# Patient Record
Sex: Female | Born: 1935 | Race: White | Hispanic: No | Smoking: Never smoker
Health system: Southern US, Community
[De-identification: ages and names within clinical notes are randomized; demographics above are authoritative.]

## PROBLEM LIST (undated history)

## (undated) DIAGNOSIS — M8580 Other specified disorders of bone density and structure, unspecified site: Secondary | ICD-10-CM

## (undated) DIAGNOSIS — K589 Irritable bowel syndrome without diarrhea: Secondary | ICD-10-CM

## (undated) DIAGNOSIS — R319 Hematuria, unspecified: Secondary | ICD-10-CM

## (undated) DIAGNOSIS — G4700 Insomnia, unspecified: Secondary | ICD-10-CM

## (undated) DIAGNOSIS — I119 Hypertensive heart disease without heart failure: Secondary | ICD-10-CM

## (undated) DIAGNOSIS — K921 Melena: Secondary | ICD-10-CM

## (undated) DIAGNOSIS — L309 Dermatitis, unspecified: Secondary | ICD-10-CM

## (undated) DIAGNOSIS — I10 Essential (primary) hypertension: Secondary | ICD-10-CM

## (undated) DIAGNOSIS — N362 Urethral caruncle: Secondary | ICD-10-CM

## (undated) DIAGNOSIS — N939 Abnormal uterine and vaginal bleeding, unspecified: Secondary | ICD-10-CM

## (undated) DIAGNOSIS — M4310 Spondylolisthesis, site unspecified: Secondary | ICD-10-CM

## (undated) DIAGNOSIS — F0390 Unspecified dementia without behavioral disturbance: Secondary | ICD-10-CM

## (undated) HISTORY — DX: Abnormal uterine and vaginal bleeding, unspecified: N93.9

## (undated) HISTORY — DX: Melena: K92.1

## (undated) HISTORY — DX: Urethral caruncle: N36.2

## (undated) HISTORY — DX: Hematuria, unspecified: R31.9

---

## 2007-01-20 ENCOUNTER — Ambulatory Visit: Payer: Self-pay | Admitting: Internal Medicine

## 2007-09-22 ENCOUNTER — Ambulatory Visit: Payer: Self-pay | Admitting: Family Medicine

## 2010-05-01 ENCOUNTER — Ambulatory Visit: Payer: Self-pay | Admitting: Ophthalmology

## 2010-06-05 ENCOUNTER — Ambulatory Visit: Payer: Self-pay | Admitting: Ophthalmology

## 2010-08-20 DIAGNOSIS — K589 Irritable bowel syndrome without diarrhea: Secondary | ICD-10-CM | POA: Insufficient documentation

## 2010-08-20 DIAGNOSIS — H9190 Unspecified hearing loss, unspecified ear: Secondary | ICD-10-CM | POA: Insufficient documentation

## 2010-08-20 DIAGNOSIS — L309 Dermatitis, unspecified: Secondary | ICD-10-CM | POA: Insufficient documentation

## 2010-08-20 DIAGNOSIS — G4700 Insomnia, unspecified: Secondary | ICD-10-CM | POA: Insufficient documentation

## 2010-08-20 DIAGNOSIS — F39 Unspecified mood [affective] disorder: Secondary | ICD-10-CM | POA: Insufficient documentation

## 2010-11-19 DIAGNOSIS — R413 Other amnesia: Secondary | ICD-10-CM | POA: Insufficient documentation

## 2010-12-18 ENCOUNTER — Ambulatory Visit: Payer: Self-pay | Admitting: Family Medicine

## 2011-07-11 ENCOUNTER — Observation Stay: Payer: Self-pay | Admitting: Internal Medicine

## 2011-07-11 LAB — COMPREHENSIVE METABOLIC PANEL
Alkaline Phosphatase: 84 U/L (ref 50–136)
BUN: 14 mg/dL (ref 7–18)
Bilirubin,Total: 0.5 mg/dL (ref 0.2–1.0)
Chloride: 101 mmol/L (ref 98–107)
Co2: 29 mmol/L (ref 21–32)
EGFR (Non-African Amer.): >60
Glucose: 97 mg/dL (ref 65–99)
Potassium: 3.4 mmol/L — ABNORMAL LOW (ref 3.5–5.1)

## 2011-07-11 LAB — URINALYSIS, COMPLETE
Bacteria: NONE SEEN
Glucose,UR: NEGATIVE mg/dL (ref 0–75)
Leukocyte Esterase: NEGATIVE
Nitrite: NEGATIVE
Ph: 5 (ref 4.5–8.0)
Squamous Epithelial: NONE SEEN
WBC UR: <1 /HPF (ref 0–5)

## 2011-07-11 LAB — CBC
HCT: 38.8 % (ref 35.0–47.0)
HGB: 12.5 g/dL (ref 12.0–16.0)
WBC: 6.4 10*3/uL (ref 3.6–11.0)

## 2011-07-12 LAB — CBC WITH DIFFERENTIAL/PLATELET
Basophil #: 0.1 x10 3/mm 3
Basophil %: 1.4 %
Eosinophil #: 0.4 x10 3/mm 3
Eosinophil %: 6.3 %
HCT: 34.8 % — ABNORMAL LOW
HGB: 11.3 g/dL — ABNORMAL LOW
Lymphocyte %: 15.6 %
Lymphs Abs: 1 x10 3/mm 3
MCH: 27 pg
MCHC: 32.6 g/dL
MCV: 83 fL
Monocyte #: 0.5 "x10 3/mm "
Monocyte %: 8.4 %
Neutrophil #: 4.3 x10 3/mm 3
Neutrophil %: 68.3 %
Platelet: 174 x10 3/mm 3
RBC: 4.2 X10 6/mm 3
RDW: 14.3 %
WBC: 6.3 x10 3/mm 3

## 2011-07-12 LAB — BASIC METABOLIC PANEL WITH GFR
Anion Gap: 8
BUN: 11 mg/dL
Calcium, Total: 8.3 mg/dL — ABNORMAL LOW
Chloride: 105 mmol/L
Co2: 25 mmol/L
Creatinine: 0.79 mg/dL
EGFR (African American): >60
EGFR (Non-African Amer.): >60
Glucose: 103 mg/dL — ABNORMAL HIGH
Osmolality: 275
Potassium: 3.2 mmol/L — ABNORMAL LOW
Sodium: 138 mmol/L

## 2011-07-12 LAB — MAGNESIUM: Magnesium: 1.4 mg/dL — ABNORMAL LOW

## 2011-07-17 LAB — CULTURE, BLOOD (SINGLE)

## 2012-01-15 ENCOUNTER — Emergency Department: Payer: Self-pay

## 2012-01-15 LAB — URINALYSIS, COMPLETE
Bacteria: NONE SEEN
Glucose,UR: NEGATIVE mg/dL (ref 0–75)
Nitrite: NEGATIVE
Ph: 8 (ref 4.5–8.0)
Protein: NEGATIVE
RBC,UR: 1 /HPF (ref 0–5)
Specific Gravity: 1.004 (ref 1.003–1.030)
WBC UR: 2 /HPF (ref 0–5)

## 2012-01-15 LAB — CK TOTAL AND CKMB (NOT AT ARMC)
CK, Total: 72 U/L (ref 21–215)
CK-MB: 2.1 ng/mL (ref 0.5–3.6)

## 2012-01-15 LAB — TROPONIN I: Troponin-I: <0.02 ng/mL

## 2012-01-15 LAB — COMPREHENSIVE METABOLIC PANEL
Albumin: 4.2 g/dL (ref 3.4–5.0)
Anion Gap: 6 — ABNORMAL LOW (ref 7–16)
BUN: 6 mg/dL — ABNORMAL LOW (ref 7–18)
Bilirubin,Total: 0.5 mg/dL (ref 0.2–1.0)
Creatinine: 0.8 mg/dL (ref 0.60–1.30)
Glucose: 149 mg/dL — ABNORMAL HIGH (ref 65–99)
Osmolality: 282 (ref 275–301)
Potassium: 3.8 mmol/L (ref 3.5–5.1)
Sodium: 141 mmol/L (ref 136–145)
Total Protein: 7.8 g/dL (ref 6.4–8.2)

## 2012-01-15 LAB — CBC
HCT: 38 % (ref 35.0–47.0)
HGB: 12.4 g/dL (ref 12.0–16.0)
MCH: 26.5 pg (ref 26.0–34.0)
RBC: 4.7 10*6/uL (ref 3.80–5.20)

## 2012-10-06 ENCOUNTER — Ambulatory Visit: Payer: Self-pay

## 2013-01-25 DIAGNOSIS — F9821 Rumination disorder of infancy: Secondary | ICD-10-CM | POA: Insufficient documentation

## 2013-01-25 DIAGNOSIS — F0390 Unspecified dementia without behavioral disturbance: Secondary | ICD-10-CM | POA: Insufficient documentation

## 2013-06-08 IMAGING — CT CT HEAD WITHOUT CONTRAST
2 series · 16 of 30 positions shown, 20 images · non-contrast
Comparison: No prior.

REASON FOR EXAM: pressure behind eyes and dizziness with new onset HTN
COMMENTS:

PROCEDURE:     CT  - CT HEAD WITHOUT CONTRAST  - January 15, 2012  [DATE]
RESULT:     History: Dizziness. Headache. High blood pressure.

[Series 2: without · axial · non-contrast · 0.42mm/px · z∈[-214,-79]mm · 13 of 33 slices shown, 17 images]
[im 3/33  brain]
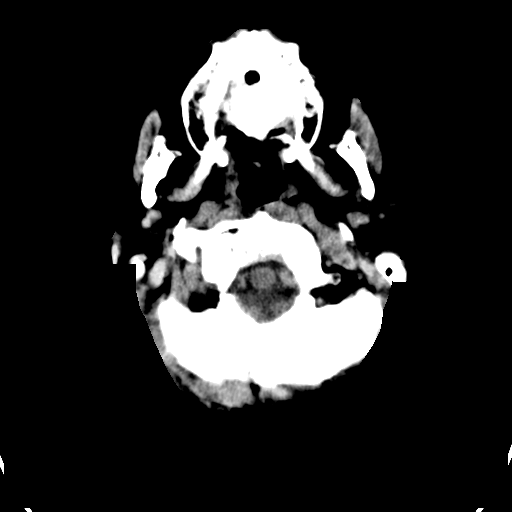
[im 3/33  bone]
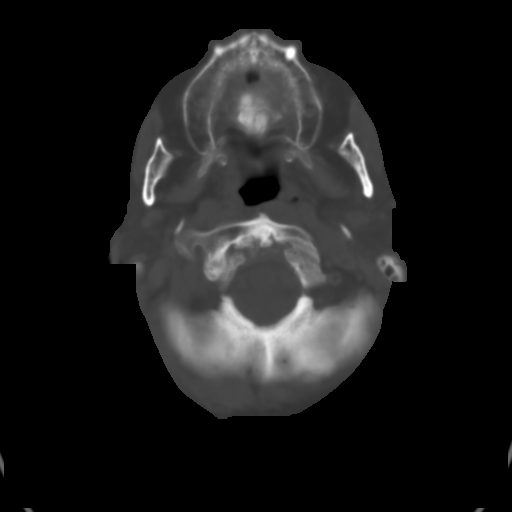
[im 5/33  brain]
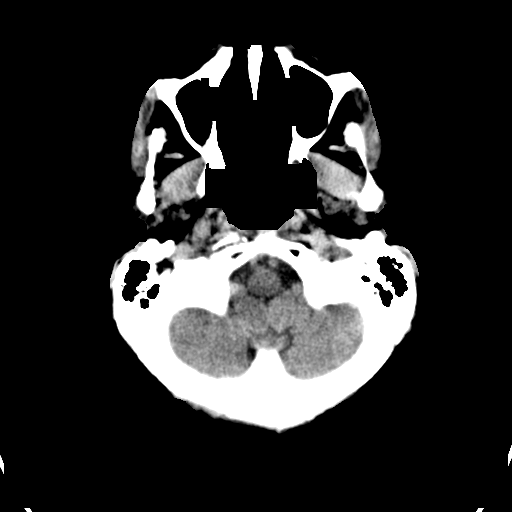
[im 7/33  brain]
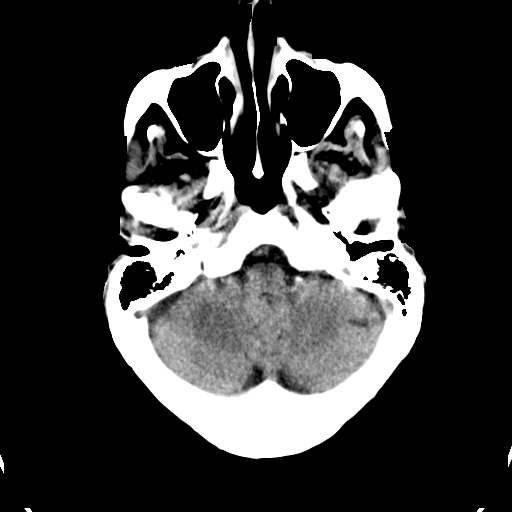
[im 10/33  brain]
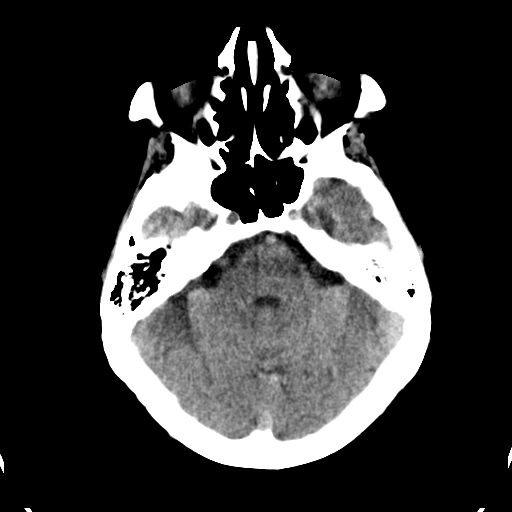
[im 12/33  brain]
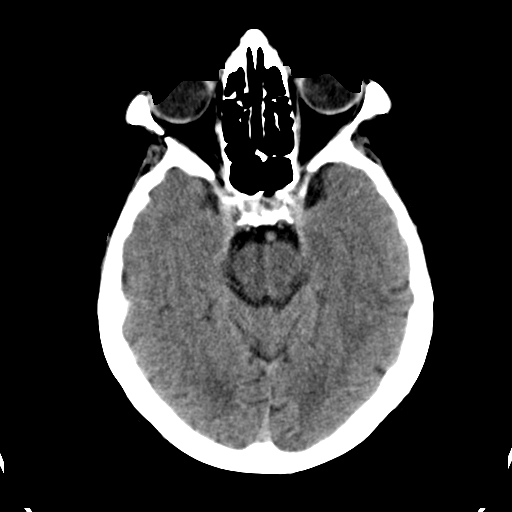
[im 12/33  bone]
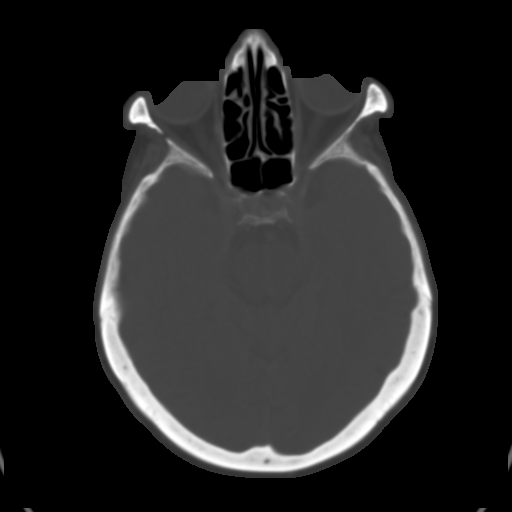
[im 14/33  brain]
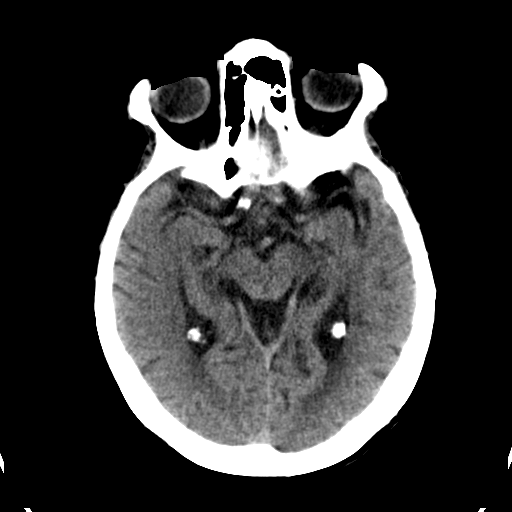
[im 17/33  brain]
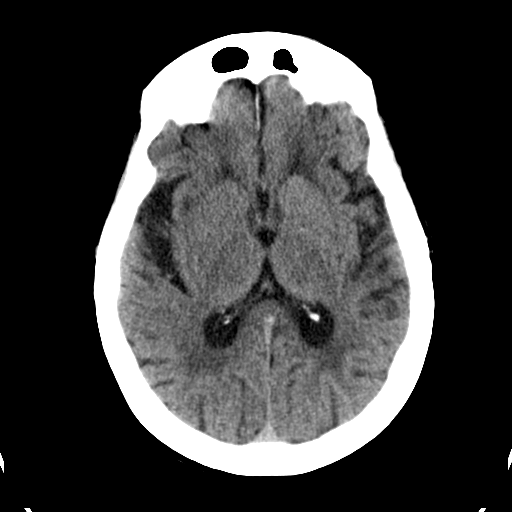
[im 19/33  brain]
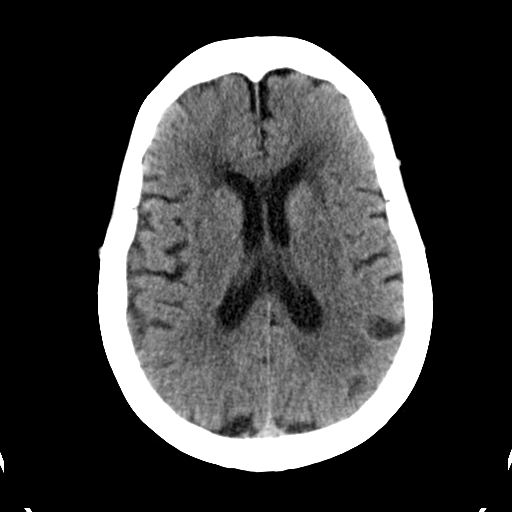
[im 21/33  brain]
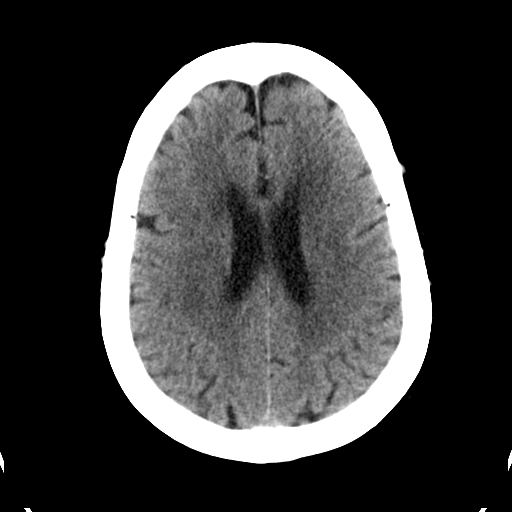
[im 21/33  bone]
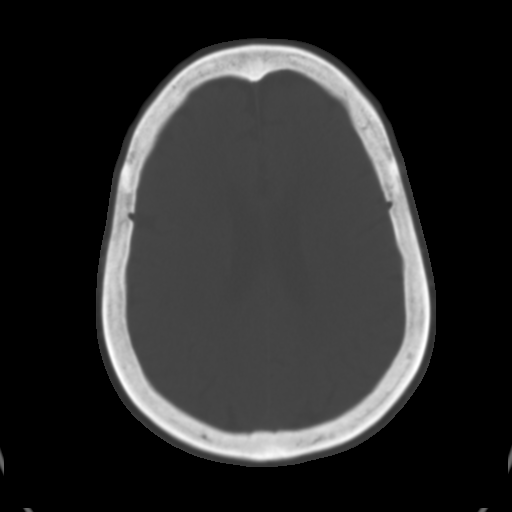
[im 23/33  brain]
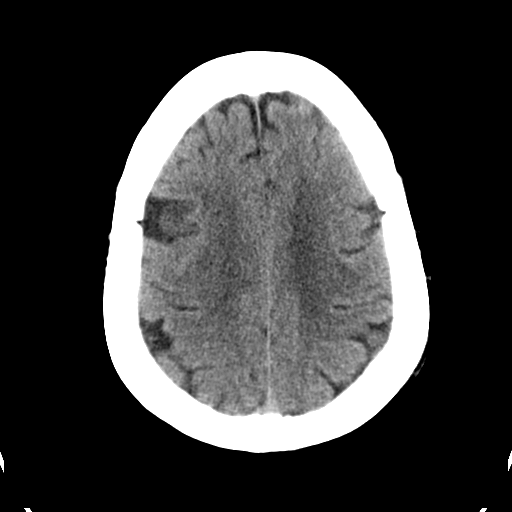
[im 26/33  brain]
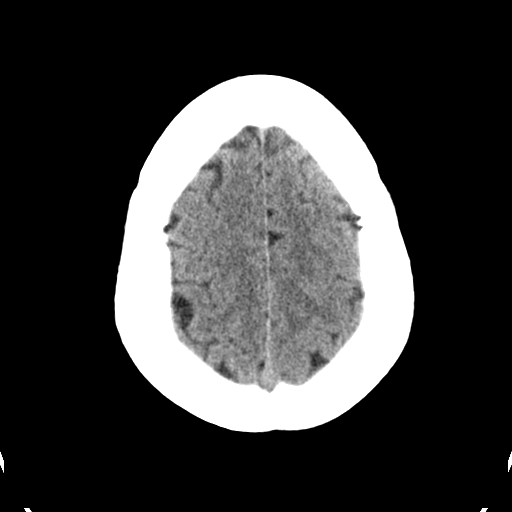
[im 28/33  brain]
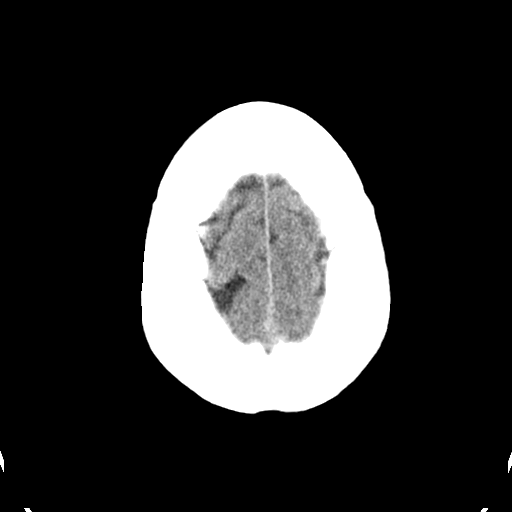
[im 30/33  brain]
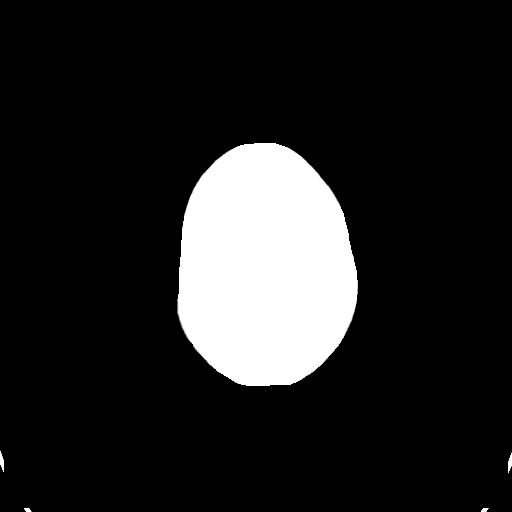
[im 30/33  bone]
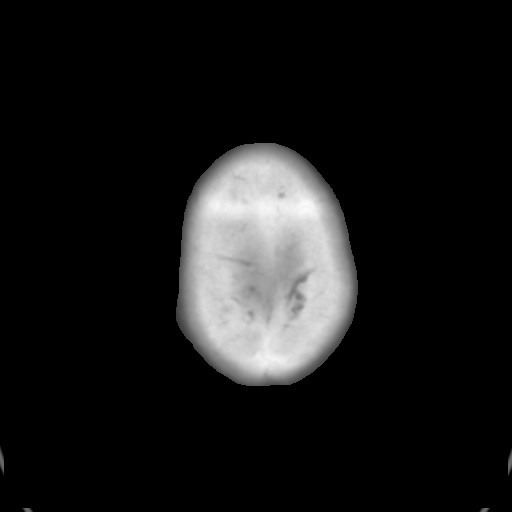

[Series 3: bone · axial · 0.42mm/px · z∈[-214,-169]mm · 3 of 33 slices shown]
[im 3/33  bone]
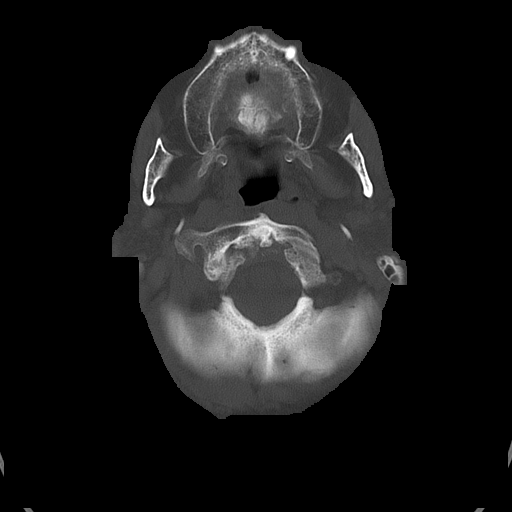
[im 7/33  bone]
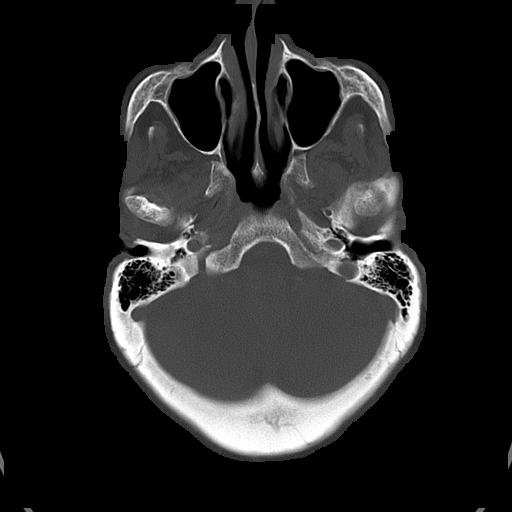
[im 12/33  bone]
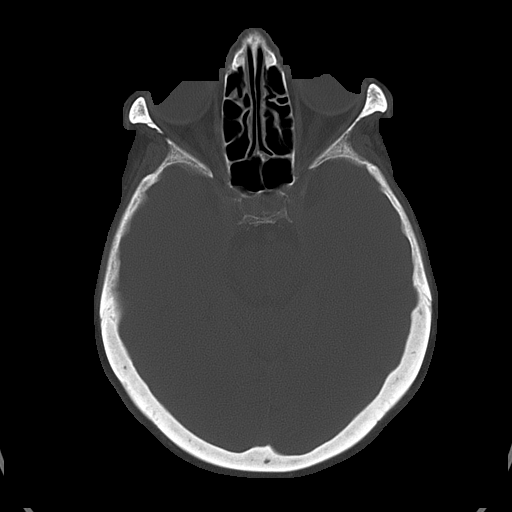

[16 of 30 positions shown; findings below may reference images not displayed]

FINDINGS: Standard nonenhanced CT obtained. Periventricular changes noted
consistent with chronic ischemia. No acute bony abnormality noted. No mass.
No hemorrhage.
IMPRESSION: Chronic white matter changes. No acute abnormality.

## 2013-07-09 DIAGNOSIS — M67919 Unspecified disorder of synovium and tendon, unspecified shoulder: Secondary | ICD-10-CM | POA: Insufficient documentation

## 2013-12-21 DIAGNOSIS — I119 Hypertensive heart disease without heart failure: Secondary | ICD-10-CM | POA: Insufficient documentation

## 2014-05-29 NOTE — H&P (Signed)
PATIENT NAME:  [name], [name] MR#:  [id number] DATE OF BIRTH:  10-12-1924  DATE OF ADMISSION:  07/11/2011  REFERRING PHYSICIAN: Dr. Buford DresserFinnell PRIMARY CARE PHYSICIAN: Dr. Gavin PottersGrandis   CHIEF COMPLAINT: Weakness.   HISTORY OF PRESENT ILLNESS: The patient is an 81 year old Caucasian female with mild dementia who has been living at home taking care of her own activities of daily living, with insomnia and mood disorder who does yard work and planting. The patient, per family who is here with her including two daughters, has had three tick bites in the last two weeks. The patient had fever and malaise and felt unwell and went [facility]. There per her daughters she was diagnosed with "tick fever" as well as a urinary tract infection and given doxycycline and Augmentin. The patient had another fever on Tuesday and [facility]. Given the long lines, they left the ER. Per daughters she has been having decreased p.o. intake and decreased energy for the past couple of days. To them this is not her baseline. She has had some increased weakness per family. On arrival here, she had no leukocytosis or fever, and had a clear urinalysis. She was given some IV fluids and the hospitalist service was contacted for further evaluation and management.  PAST MEDICAL HISTORY:  1. Dementia.  2. IDS. 3. Eczema.  4. Insomnia.  5. Mood disorder.   PAST SURGICAL HISTORY: Hysterectomy.   FAMILY HISTORY: Per daughter there is no family history of hypertension, diabetes, or coronary artery disease.   SOCIAL HISTORY: Lives independently. No alcohol, tobacco, or drug use.   ALLERGIES: No known drug allergies.   MEDICATIONS:  1. Magnesium oxide 1 tab daily.  2. Vitamin B12 1 tab daily, unknown dose. 3. Aspirin 81 mg, 2 tabs daily.  4. Namenda 5 mg, 1 tab 2 times a day.  5. Trazodone 100 mg at bedtime.  6. Folic acid 1 mg daily.  7. Doxycycline 100 mg 2 times a day. Today is day 3 out of 14.    REVIEW OF SYSTEMS: Unable  to obtain as the patient is with dementia and she denies every question asked, and I do not believe the patient is fully reliable.  PHYSICAL EXAMINATION: VITAL SIGNS: Temperature 98.6, pulse rate 67, respiratory rate 18, blood pressure 115/61. Oxygen saturation 98% on room air on arrival.   GENERAL: The patient is an elderly, thin Caucasian female laying in bed in no obvious distress, talking in full sentences.   HEENT: Normocephalic, atraumatic. Pupils are equal and reactive. Anicteric sclerae. Slight rash on the upper lip.   NECK: Supple. No thyroid tenderness. No cervical or submental lymphadenopathy. No mandibular lymphadenopathy.   CARDIOVASCULAR: S1, S2 regular rate and rhythm. No murmurs, rubs, or gallops.   LUNGS: Clear to auscultation without wheezing or rhonchi.   ABDOMEN: Soft, nontender, nondistended. Positive normal active bowel sounds in all quadrants.   EXTREMITIES: There is no significant lower extremity edema.   SKIN: Significant varicose veins in the lower extremities. There is perhaps a very mild petechial rash on the right ankle area and area of rash without ulceration below the left breast, perhaps about an inch area. No tenderness.  Diffuse.  NEUROLOGICAL: Cranial nerves III through XII grossly intact. Strength is five out of five in all extremities. Sensation is intact to light touch.   PSYCH: Awake, not oriented to time or place but oriented to daughters.   LABORATORY, DIAGNOSTIC, AND RADIOLOGICAL DATA: Sodium 137, potassium 3.4, otherwise BMP  within normal limits. LFTs within normal limits. Troponin negative. CBC within normal limits. WBC 6.4. Urinalysis not suggestive of infection, without nitrites or leukocyte esterase, or bacteria.   EKG normal sinus rhythm, rate 79, incomplete right bundle branch block, possible anterior infarct, age indeterminant. No acute ST elevations or depressions.   ASSESSMENT AND PLAN: 81 year old Caucasian female with dementia, IDS,  and eczema who is living independently, status post three thick bites in the last several weeks with fever who is being treated with doxycycline. She has had lab work done at [facility] prior.  She presents with weakness and decreased p.o. intake. The patient has no significant electrolyte abnormalities and has a urinalysis which does not suggest significant dehydration or urinary tract infection. She has no fever or leukocytosis. The patient overall is not a reliable historian. However, she states that she has a bad taste in her mouth, which possibly might be in the setting of some medications, possibly her antibiotics. Furthermore, perhaps there is an infectious reason for this given the tick bites. However, she is on proper treatment with doxycycline and her urinary tract infection has been treated adequately. We will admit the patient for observation and start the patient on some D5 one-half normal saline. I discussed the importance of getting the lab work done at [facility] with the family and they will sign a consent. She is afebrile. Given the patient's history of eczema and chronic rashes on her lower and upper extremities per family, it is difficult to entertain possibility of a tickborne rash at this point, however. Blood cultures have been drawn here which we will follow.  I would  also add some Lyme antibodies including IgM and IgG for now and consider doing further testing including Western blot. I will continue her Namenda, trazodone, and vitamins.  I will also continue the aspirin. The patient is a DO NOT RESUSCITATE per family.   TOTAL TIME SPENT: 60 minutes.  ____________________________ Krystal Eaton, MD sa:bjt D:  07/11/2011 21:15:37 ET          T: 07/12/2011 07:57:24 ET          JOB#: [id number]  cc: Letitia Caul, MD Krystal Eaton MD ELECTRONICALLY SIGNED 07/26/2011 20:01

## 2014-05-29 NOTE — Discharge Summary (Signed)
PATIENT NAMHarvel Chavez:  [name] MR#:  [id number] DATE OF BIRTH:  [birth year]  DATE OF ADMISSION:  07/11/2011 DATE OF DISCHARGE:  07/12/2011  PRIMARY CARE PHYSICIAN: Rolm GalaHeidi Grandis, MD   FINAL DIAGNOSES:  1. Weakness.  2. Hypokalemia.  3. Hypomagnesemia.  4. Fever, possibly tick borne.  5. Dementia.   MEDICATIONS ON DISCHARGE:  1. Vitamin B12 1 tablet daily.  2. Trazodone 100 mg at bedtime.  3. Folic acid 1 mg daily.  4. Namenda 10 mg at bedtime.  5. Doxycycline 100 mg twice a day.  6. Magnesium oxide 400 mg twice a day.  7. Potassium chloride 20 mEq daily.   REFERRAL: Home physical therapy   ACTIVITY: As tolerated.   DIET: Regular, anything you want to eat.  FOLLOW-UP: Keep appointment Monday with Dr. Gavin PottersGrandis.   REASON FOR ADMISSION: The patient was admitted as an observation on 07/11/2011, came in with weakness.   HISTORY OF PRESENT ILLNESS: This is an 81 year old female with dementia living at home with family. She had fever and malaise, felt unwell, [facility] and diagnosed with tick fever and urinary infection, given doxycycline and Augmentin. She had another fever and [facility] Given the long wait they left the Emergency Room. She has been having decreased energy and oral intake and increased weakness. Hospitalist services were contacted for further evaluation. The admitting physician kept the doxycycline going with three tick bites. She had a bad taste in her mouth. The patient was admitted as an observation and given fluids.   LABORATORY AND RADIOLOGICAL DATA DURING THE HOSPITAL COURSE: Chest x-ray showed no acute disease in the chest.   Blood cultures negative. Troponin negative. Glucose 97, BUN 14, creatinine 0.85, sodium 137, potassium 3.4, chloride 101, CO2 29. Liver function tests normal. White blood cell count 6.4, hemoglobin and hematocrit 12.5 and 38.8, platelet count 187. Blood cultures no growth the last 36 hours. Urinalysis negative.   EKG  normal sinus rhythm, left atrial enlargement, incomplete right bundle branch block.   White count upon discharge 6.3. Hemoglobin on discharge 11.3 after hydration. Platelet count 174. Potassium 3.2. Magnesium 1.4.  HOSPITAL COURSE: The patient was admitted as an observation.  1. For the patient's weakness, physical therapy saw the patient and recommended home with home health.  2. For the patient's hypokalemia and hypomagnesemia, this was replaced during the hospitalization and continue replacement as outpatient. Recommend follow-up appointment with Dr. Gavin PottersGrandis. Keep that on Monday and check potassium and magnesium at that time.  3. Fever, possibly tick borne. [place] Spotted Fever and lyme titers were sent off, results pending on discharge. Either way the patient is on doxycycline. The patient was afebrile, normal pulse, normal blood pressure, pulse oximetry normal, and normal white count upon discharge.  4. For the patient's dementia, the patient is on Namenda. In speaking with the family it seems like the decreased appetite has been going on for a while, not just recently. I recommended to the patient and family she can eat whatever she wants, even trying Ensure in between meals. Close clinical follow-up as needed. I recommended stopping aspirin just in case that could be upsetting her stomach. The doxycycline could also upset the stomach if you take that on an empty stomach but her decreased appetite has been going on for longer than the course of the doxycycline.    TIME SPENT ON DISCHARGE: Over 40 minutes.   ____________________________ Herschell Dimesichard J. Renae GlossWieting, MD rjw:drc D: 07/13/2011 15:07:31 ET T:  07/15/2011 12:06:10 ET JOB#: [id number]  cc: Herschell Dimes. Renae Gloss, MD, <Dictator> Letitia Caul, MD Salley Scarlet MD ELECTRONICALLY SIGNED 07/20/2011 13:07

## 2014-07-28 DIAGNOSIS — R2689 Other abnormalities of gait and mobility: Secondary | ICD-10-CM | POA: Insufficient documentation

## 2014-07-28 DIAGNOSIS — G723 Periodic paralysis: Secondary | ICD-10-CM | POA: Insufficient documentation

## 2014-07-28 DIAGNOSIS — M8580 Other specified disorders of bone density and structure, unspecified site: Secondary | ICD-10-CM | POA: Insufficient documentation

## 2014-07-28 DIAGNOSIS — M4310 Spondylolisthesis, site unspecified: Secondary | ICD-10-CM | POA: Insufficient documentation

## 2014-08-12 ENCOUNTER — Other Ambulatory Visit: Payer: Self-pay | Admitting: Family Medicine

## 2014-08-12 DIAGNOSIS — N939 Abnormal uterine and vaginal bleeding, unspecified: Secondary | ICD-10-CM

## 2014-08-15 ENCOUNTER — Ambulatory Visit: Payer: Self-pay

## 2014-08-22 ENCOUNTER — Ambulatory Visit: Payer: Self-pay | Admitting: Urology

## 2015-02-05 ENCOUNTER — Inpatient Hospital Stay
Admission: EM | Admit: 2015-02-05 | Discharge: 2015-02-09 | DRG: 175 | Disposition: A | Discharge status: Home Health | Payer: Medicare Other | Attending: Internal Medicine | Admitting: Internal Medicine

## 2015-02-05 ENCOUNTER — Emergency Department: Payer: Medicare Other

## 2015-02-05 DIAGNOSIS — Z66 Do not resuscitate: Secondary | ICD-10-CM | POA: Diagnosis present

## 2015-02-05 DIAGNOSIS — Z881 Allergy status to other antibiotic agents status: Secondary | ICD-10-CM

## 2015-02-05 DIAGNOSIS — K580 Irritable bowel syndrome with diarrhea: Secondary | ICD-10-CM | POA: Diagnosis present

## 2015-02-05 DIAGNOSIS — Z882 Allergy status to sulfonamides status: Secondary | ICD-10-CM

## 2015-02-05 DIAGNOSIS — Z79899 Other long term (current) drug therapy: Secondary | ICD-10-CM

## 2015-02-05 DIAGNOSIS — N3000 Acute cystitis without hematuria: Secondary | ICD-10-CM | POA: Diagnosis present

## 2015-02-05 DIAGNOSIS — I119 Hypertensive heart disease without heart failure: Secondary | ICD-10-CM | POA: Diagnosis present

## 2015-02-05 DIAGNOSIS — J9691 Respiratory failure, unspecified with hypoxia: Secondary | ICD-10-CM

## 2015-02-05 DIAGNOSIS — J9601 Acute respiratory failure with hypoxia: Secondary | ICD-10-CM | POA: Diagnosis present

## 2015-02-05 DIAGNOSIS — M8580 Other specified disorders of bone density and structure, unspecified site: Secondary | ICD-10-CM | POA: Diagnosis present

## 2015-02-05 DIAGNOSIS — I959 Hypotension, unspecified: Secondary | ICD-10-CM | POA: Diagnosis present

## 2015-02-05 DIAGNOSIS — R609 Edema, unspecified: Secondary | ICD-10-CM

## 2015-02-05 DIAGNOSIS — E876 Hypokalemia: Secondary | ICD-10-CM | POA: Diagnosis not present

## 2015-02-05 DIAGNOSIS — I2699 Other pulmonary embolism without acute cor pulmonale: Secondary | ICD-10-CM | POA: Diagnosis not present

## 2015-02-05 DIAGNOSIS — F0391 Unspecified dementia with behavioral disturbance: Secondary | ICD-10-CM | POA: Diagnosis present

## 2015-02-05 DIAGNOSIS — G9341 Metabolic encephalopathy: Secondary | ICD-10-CM | POA: Diagnosis present

## 2015-02-05 DIAGNOSIS — F329 Major depressive disorder, single episode, unspecified: Secondary | ICD-10-CM | POA: Diagnosis present

## 2015-02-05 DIAGNOSIS — Z880 Allergy status to penicillin: Secondary | ICD-10-CM

## 2015-02-05 DIAGNOSIS — B965 Pseudomonas (aeruginosa) (mallei) (pseudomallei) as the cause of diseases classified elsewhere: Secondary | ICD-10-CM | POA: Diagnosis present

## 2015-02-05 DIAGNOSIS — R0902 Hypoxemia: Secondary | ICD-10-CM

## 2015-02-05 DIAGNOSIS — Z7982 Long term (current) use of aspirin: Secondary | ICD-10-CM

## 2015-02-05 DIAGNOSIS — E440 Moderate protein-calorie malnutrition: Secondary | ICD-10-CM | POA: Diagnosis present

## 2015-02-05 DIAGNOSIS — N390 Urinary tract infection, site not specified: Secondary | ICD-10-CM

## 2015-02-05 HISTORY — DX: Insomnia, unspecified: G47.00

## 2015-02-05 HISTORY — DX: Other specified disorders of bone density and structure, unspecified site: M85.80

## 2015-02-05 HISTORY — DX: Unspecified dementia, unspecified severity, without behavioral disturbance, psychotic disturbance, mood disturbance, and anxiety: F03.90

## 2015-02-05 HISTORY — DX: Spondylolisthesis, site unspecified: M43.10

## 2015-02-05 HISTORY — DX: Irritable bowel syndrome, unspecified: K58.9

## 2015-02-05 HISTORY — DX: Dermatitis, unspecified: L30.9

## 2015-02-05 HISTORY — DX: Hypertensive heart disease without heart failure: I11.9

## 2015-02-05 LAB — BASIC METABOLIC PANEL
Anion gap: 8 (ref 5–15)
BUN: 21 mg/dL — ABNORMAL HIGH (ref 6–20)
CO2: 29 mmol/L (ref 22–32)
Calcium: 7.8 mg/dL — ABNORMAL LOW (ref 8.9–10.3)
Chloride: 95 mmol/L — ABNORMAL LOW (ref 101–111)
Creatinine, Ser: 0.94 mg/dL (ref 0.44–1.00)
GFR calc Af Amer: >60 mL/min (ref >60)
GFR calc non Af Amer: 53 mL/min — ABNORMAL LOW (ref >60)
Glucose, Bld: 212 mg/dL — ABNORMAL HIGH (ref 65–99)
Potassium: <2 mmol/L — CL (ref 3.5–5.1)
Sodium: 132 mmol/L — ABNORMAL LOW (ref 135–145)

## 2015-02-05 LAB — CBC
HCT: 33.3 % — ABNORMAL LOW (ref 35.0–47.0)
HEMOGLOBIN: 11.1 g/dL — AB (ref 12.0–16.0)
MCH: 27.7 pg (ref 26.0–34.0)
MCHC: 33.4 g/dL (ref 32.0–36.0)
MCV: 82.8 fL (ref 80.0–100.0)
Platelets: 230 10*3/uL (ref 150–440)
RBC: 4.02 MIL/uL (ref 3.80–5.20)
RDW: 16.9 % — ABNORMAL HIGH (ref 11.5–14.5)
WBC: 14.5 10*3/uL — ABNORMAL HIGH (ref 3.6–11.0)

## 2015-02-05 LAB — URINALYSIS COMPLETE WITH MICROSCOPIC (ARMC ONLY)
BACTERIA UA: NONE SEEN
Bilirubin Urine: NEGATIVE
Glucose, UA: NEGATIVE mg/dL
Hgb urine dipstick: NEGATIVE
KETONES UR: NEGATIVE mg/dL
Nitrite: NEGATIVE
PH: 5 (ref 5.0–8.0)
PROTEIN: 30 mg/dL — AB
SQUAMOUS EPITHELIAL / LPF: NONE SEEN
Specific Gravity, Urine: 1.018 (ref 1.005–1.030)

## 2015-02-05 LAB — MAGNESIUM: MAGNESIUM: 1.8 mg/dL (ref 1.7–2.4)

## 2015-02-05 MED ORDER — MAGNESIUM SULFATE 2 GM/50ML IV SOLN
2.0000 g | Freq: Once | INTRAVENOUS | Status: AC
  Administered 2015-02-05: 2 g via INTRAVENOUS
  Filled 2015-02-05: qty 50

## 2015-02-05 MED ORDER — ACETAMINOPHEN 650 MG RE SUPP: 650.0000 mg | Freq: Four times a day (QID) | RECTAL | Status: DC | PRN

## 2015-02-05 MED ORDER — FOLIC ACID 1 MG PO TABS
1.0000 mg | ORAL_TABLET | Freq: Every day | ORAL | Status: DC
  Administered 2015-02-06 – 2015-02-09 (×4): 1 mg via ORAL
  Filled 2015-02-05 (×4): qty 1

## 2015-02-05 MED ORDER — QUETIAPINE FUMARATE 25 MG PO TABS
25.0000 mg | ORAL_TABLET | Freq: Every day | ORAL | Status: DC
  Administered 2015-02-05 – 2015-02-08 (×4): 25 mg via ORAL
  Filled 2015-02-05 (×4): qty 1

## 2015-02-05 MED ORDER — ONDANSETRON HCL 4 MG/2ML IJ SOLN: 4.0000 mg | Freq: Four times a day (QID) | INTRAMUSCULAR | Status: DC | PRN

## 2015-02-05 MED ORDER — HEPARIN SODIUM (PORCINE) 5000 UNIT/ML IJ SOLN
5000.0000 [IU] | Freq: Three times a day (TID) | INTRAMUSCULAR | Status: DC
  Administered 2015-02-05 – 2015-02-06 (×3): 5000 [IU] via SUBCUTANEOUS
  Filled 2015-02-05 (×3): qty 1

## 2015-02-05 MED ORDER — ASPIRIN 81 MG PO CHEW
81.0000 mg | CHEWABLE_TABLET | Freq: Every day | ORAL | Status: DC
  Administered 2015-02-06 – 2015-02-09 (×4): 81 mg via ORAL
  Filled 2015-02-05 (×4): qty 1

## 2015-02-05 MED ORDER — CETYLPYRIDINIUM CHLORIDE 0.05 % MT LIQD
7.0000 mL | Freq: Two times a day (BID) | OROMUCOSAL | Status: DC
  Administered 2015-02-05 – 2015-02-09 (×6): 7 mL via OROMUCOSAL

## 2015-02-05 MED ORDER — OXYCODONE HCL 5 MG PO TABS: 5.0000 mg | ORAL_TABLET | ORAL | Status: DC | PRN

## 2015-02-05 MED ORDER — POTASSIUM CHLORIDE IN NACL 20-0.9 MEQ/L-% IV SOLN
INTRAVENOUS | Status: DC
  Administered 2015-02-05 – 2015-02-07 (×5): via INTRAVENOUS
  Filled 2015-02-05 (×7): qty 1000

## 2015-02-05 MED ORDER — DEXTROSE 5 % IV SOLN
1.0000 g | Freq: Once | INTRAVENOUS | Status: AC
  Administered 2015-02-05: 1 g via INTRAVENOUS
  Filled 2015-02-05: qty 10

## 2015-02-05 MED ORDER — MEMANTINE HCL 5 MG PO TABS
10.0000 mg | ORAL_TABLET | Freq: Every day | ORAL | Status: DC
  Administered 2015-02-06 – 2015-02-07 (×2): 10 mg via ORAL
  Filled 2015-02-05 (×2): qty 2

## 2015-02-05 MED ORDER — CEFTRIAXONE SODIUM 1 G IJ SOLR
1.0000 g | INTRAMUSCULAR | Status: DC
  Administered 2015-02-06 – 2015-02-07 (×2): 1 g via INTRAVENOUS
  Filled 2015-02-05 (×3): qty 10

## 2015-02-05 MED ORDER — ONDANSETRON HCL 4 MG PO TABS: 4.0000 mg | ORAL_TABLET | Freq: Four times a day (QID) | ORAL | Status: DC | PRN

## 2015-02-05 MED ORDER — MORPHINE SULFATE (PF) 2 MG/ML IV SOLN: 2.0000 mg | INTRAVENOUS | Status: DC | PRN

## 2015-02-05 MED ORDER — POTASSIUM CHLORIDE 10 MEQ/100ML IV SOLN
10.0000 meq | INTRAVENOUS | Status: AC
  Administered 2015-02-05 (×4): 10 meq via INTRAVENOUS
  Filled 2015-02-05 (×4): qty 100

## 2015-02-05 MED ORDER — ACETAMINOPHEN 325 MG PO TABS: 650.0000 mg | ORAL_TABLET | Freq: Four times a day (QID) | ORAL | Status: DC | PRN

## 2015-02-05 NOTE — ED Notes (Signed)
Dr. Derrill KayGoodman notified of critical lab value - Potassium less than 2.0, acknowledged no new orders.

## 2015-02-05 NOTE — ED Notes (Signed)
[facility]. Pt sent to ER for evaluation of cough,  Fever, and dark urine. Reported that pt lives at home, a doctor did a house call yesterday and perofmred EKG and chest xray. Pt unsure of why she is here.

## 2015-02-05 NOTE — ED Notes (Signed)
In and out cath performed, sterile procedure, pt tolerated well,  Dark color urine specimen sent to lab.

## 2015-02-05 NOTE — ED Provider Notes (Signed)
[facility] Emergency Department Provider Note    ____________________________________________  Time seen: 1830  I have reviewed the triage vital signs and the nursing notes.   HISTORY  Chief Complaint Cough; Fever; and Urinary Tract Infection   History limited by: Dementia   HPI Colleen QualeDorothy L Chavez is a 82 y.o. female with history of dementia who presented to the emergency department today via EMS because of family's concerns for cough, fever and dark colored urine. The family was not present at the time of my initial examination to give any history. Per report however the patient was visited by a doctor at her house yesterday and a chest x-ray and EKG were performed. It is unclear the findings however the family stated to EMS they were not told that anything was abnormal. Per report Tmax was 102 today. The patient herself cannot give any history.     Past Medical History  Diagnosis Date  . Vaginal bleeding   . Urethra, polyp   . Blood in stool   . Hematuria   . IBS (irritable bowel syndrome)   . Eczema   . Insomnia   . Osteopenia   . Anterolisthesis   . Dementia   . LVH (left ventricular hypertrophy) due to hypertensive disease     Patient Active Problem List   Diagnosis Date Noted  . SPL (spondylolisthesis) 07/28/2014  . Osteopenia 07/28/2014  . Poor balance 07/28/2014  . Adynamia 07/28/2014  . Hypertensive left ventricular hypertrophy 12/21/2013  . Disorder of rotator cuff 07/09/2013  . Dementia 01/25/2013  . Regurgitation, of nonorganic origin, of food with reswallowing 01/25/2013  . Blood pressure elevated 12/28/2012  . Amnesia 11/19/2010  . Dermatitis, eczematoid 08/20/2010  . Difficulty hearing 08/20/2010  . Adaptive colitis 08/20/2010  . Cannot sleep 08/20/2010  . Affective disorder (HCC) 08/20/2010    History reviewed. No pertinent past surgical history.  Current Outpatient Rx  Name  Route  Sig  Dispense  Refill  . aspirin 81  MG chewable tablet   Oral   Chew by mouth.         . Cyanocobalamin (VITAMIN B-12 CR PO)   Oral   Take by mouth.         . dicyclomine (BENTYL) 20 MG tablet   Oral   Take by mouth.         . folic acid (FOLVITE) 1 MG tablet   Oral   Take by mouth.         . Iron-Vitamins (GERITOL COMPLETE) TABS   Oral   Take by mouth.         Marland Kitchen. ketoconazole (NIZORAL) 2 % cream   Topical   Apply topically.         . memantine (NAMENDA) 10 MG tablet   Oral   Take by mouth.         . EXPIRED: PARoxetine (PAXIL) 10 MG tablet   Oral   Take by mouth.         . QUEtiapine (SEROQUEL) 25 MG tablet   Oral   Take by mouth.           Allergies Azithromycin; Bactrim; Other; Penicillins; Sulfa antibiotics; and Vibramycin  No family history on file.  Social History Social History  Substance Use Topics  . Smoking status: Unknown If Ever Smoked  . Smokeless tobacco: None  . Alcohol Use: No    Review of Systems  Constitutional: Positive for fever. Cardiovascular: Negative for chest pain. Respiratory: Negative  for shortness of breath. Gastrointestinal: Negative for abdominal pain, vomiting and diarrhea.  10-point ROS otherwise negative.  ____________________________________________   PHYSICAL EXAM:  VITAL SIGNS:  99.4 F (37.4 C)  82  20  133/71 mmHg  93 %    Constitutional: Awake and alert, pleasant, not oriented Eyes: Conjunctivae are normal. PERRL. Normal extraocular movements. ENT   Head: Normocephalic and atraumatic.   Nose: No congestion/rhinnorhea.   Mouth/Throat: Mucous membranes are moist.   Neck: No stridor. Hematological/Lymphatic/Immunilogical: No cervical lymphadenopathy. Cardiovascular: Normal rate, regular rhythm.  No murmurs, rubs, or gallops. Respiratory: Normal respiratory effort without tachypnea nor retractions. Breath sounds are clear and equal bilaterally. No wheezes/rales/rhonchi. Gastrointestinal: Soft and nontender.  No distention.  Genitourinary: Deferred Musculoskeletal: Normal range of motion in all extremities. No joint effusions.  No lower extremity tenderness nor edema. Neurologic:  Awake and alert, not oriented to time or event. Moves all extremities. Sensation grossly intact. Skin:  Skin is warm, dry and intact. No rash noted.   ____________________________________________    LABS (pertinent positives/negatives)  Labs Reviewed  BASIC METABOLIC PANEL - Abnormal; Notable for the following:    Sodium 132 (*)    Potassium <2.0 (*)    Chloride 95 (*)    Glucose, Bld 212 (*)    BUN 21 (*)    Calcium 7.8 (*)    GFR calc non Af Amer 53 (*)    All other components within normal limits  CBC - Abnormal; Notable for the following:    WBC 14.5 (*)    Hemoglobin 11.1 (*)    HCT 33.3 (*)    RDW 16.9 (*)    All other components within normal limits  URINALYSIS COMPLETEWITH MICROSCOPIC ([facility] ONLY) - Abnormal; Notable for the following:    Color, Urine AMBER (*)    APPearance CLEAR (*)    Protein, ur 30 (*)    Leukocytes, UA TRACE (*)    All other components within normal limits  CULTURE, BLOOD (ROUTINE X 2)  CULTURE, BLOOD (ROUTINE X 2)  URINE CULTURE  MAGNESIUM  BASIC METABOLIC PANEL    ____________________________________________   EKG  I, Phineas Semen, attending physician, personally viewed and interpreted this EKG  EKG Time: 1807 Rate: 84 Rhythm: NSR Axis: normal Intervals: qtc 603 QRS: narrow, q waves V1, V2, V3, III ST changes: no st elevation Impression: abnormal ekg   ____________________________________________    RADIOLOGY  CXR IMPRESSION: No active disease.   ____________________________________________   PROCEDURES  Procedure(s) performed: None  Critical Care performed: No  ____________________________________________   INITIAL IMPRESSION / ASSESSMENT AND PLAN / ED COURSE  Pertinent labs & imaging results that were available during my care  of the patient were reviewed by me and considered in my medical decision making (see chart for details).  Patient presented to the emergency department today because of concerns for cough, fever and dark colored urine. The patient's blood work was notable for hypokalemia as well as leukocytosis. Patient was given potassium IV. Furthermore urine did have 6-30 white blood cells so the patient was given IV antibiotics. Plan to admit to the hospitalist service for further potassium repletion and IV antibiotics.  ____________________________________________   FINAL CLINICAL IMPRESSION(S) / ED DIAGNOSES  Final diagnoses:  UTI (lower urinary tract infection)  Hypokalemia     Phineas Semen, MD 02/05/15 2103

## 2015-02-05 NOTE — Progress Notes (Signed)
Patient new admission to floor.  Patient and family oriented to room and unit.    Progression to Goals:  Problem: Safety: Goal: Ability to remain free from injury will improve Outcome: Progressing Pt high fall risk bed alarm in use call bell and phone within reach hourly safety rounds  Problem: Oxygenation Goal:Ability to maintain adequate oxygenation and ventilation will improve Outcome: Progressing Pt currently on 3L/min O2, sating in upper 90s   Problem: Clinical Measurements Goal: Ability to maintain clinical measurements within normal limits Outcome:  Progressing Magnesium IV given this shift Potassium IV given this shift x3 NS with K+ infusing at 75Ml/Hr

## 2015-02-05 NOTE — ED Notes (Signed)
MD at bedside. 

## 2015-02-05 NOTE — H&P (Signed)
[facility] - [place] at [facility]   PATIENT NAME: Colleen LaughterDorothy Chavez    MR#:  [id number]  DATE OF BIRTH:  02/05/1924   DATE OF ADMISSION:  02/05/2015  PRIMARY CARE PHYSICIAN: Colleen GalaGRANDIS, HEIDI, MD   REQUESTING/REFERRING PHYSICIAN: Derrill KayGoodman  CHIEF COMPLAINT:   Chief Complaint  Patient presents with  . Cough  . Fever  . Urinary Tract Infection    HISTORY OF PRESENT ILLNESS:  Colleen LaughterDorothy Chavez  is a 82 y.o. female with a known history of dementia, irritable bowel syndrome who is presenting with altered mental status. The patient is unable to provide meaningful information given mental status/medical history history obtained from daughter who is present at bedside. She states approximately one day duration of altered mental status described as confusion with associated weakness. Subjective fevers and chills no further symptomatology.   PAST MEDICAL HISTORY:   Past Medical History  Diagnosis Date  . Vaginal bleeding   . Urethra, polyp   . Blood in stool   . Hematuria   . IBS (irritable bowel syndrome)   . Eczema   . Insomnia   . Osteopenia   . Anterolisthesis   . Dementia   . LVH (left ventricular hypertrophy) due to hypertensive disease     PAST SURGICAL HISTORY:  History reviewed. No pertinent past surgical history.  SOCIAL HISTORY:   Social History  Substance Use Topics  . Smoking status: Unknown If Ever Smoked  . Smokeless tobacco: Not on file  . Alcohol Use: No    FAMILY HISTORY:  No family history on file.  DRUG ALLERGIES:   Allergies  Allergen Reactions  . Azithromycin Itching  . Bactrim [Sulfamethoxazole-Trimethoprim]   . Other Nausea And Vomiting  . Penicillins Swelling  . Sulfa Antibiotics   . Vibramycin [Doxycycline Calcium]     REVIEW OF SYSTEMS:   unable to obtain given patient's mental status/medical condition  MEDICATIONS AT HOME:   Prior to Admission medications   Medication Sig Start Date End Date Taking?  Authorizing Provider  aspirin 81 MG chewable tablet Chew by mouth.    Historical Provider, MD  Cyanocobalamin (VITAMIN B-12 CR PO) Take by mouth. 06/29/10   Historical Provider, MD  dicyclomine (BENTYL) 20 MG tablet Take by mouth. 06/07/13   Historical Provider, MD  folic acid (FOLVITE) 1 MG tablet Take by mouth. 04/29/10   Historical Provider, MD  Iron-Vitamins (GERITOL COMPLETE) TABS Take by mouth.    Historical Provider, MD  ketoconazole (NIZORAL) 2 % cream Apply topically. 03/11/14 03/11/15  Historical Provider, MD  memantine (NAMENDA) 10 MG tablet Take by mouth. 07/26/14   Historical Provider, MD  PARoxetine (PAXIL) 10 MG tablet Take by mouth. 12/06/13 11/05/14  Historical Provider, MD  QUEtiapine (SEROQUEL) 25 MG tablet Take by mouth. 07/12/14 07/07/15  Historical Provider, MD      VITAL SIGNS:  Blood pressure 118/99, pulse 75, temperature 99.4 F (37.4 C), temperature source Oral, resp. rate 21, height 5\' 2"  (1.575 m), weight 140 lb (63.504 kg), SpO2 97 %.  PHYSICAL EXAMINATION:  VITAL SIGNS: Filed Vitals:   02/05/15 2000 02/05/15 2030  BP: 127/81 118/99  Pulse: 76 75  Temp:    Resp: 19 21   GENERAL:82 y.o.female currently inMinimal  Acute distress.  HEAD: Normocephalic, atraumatic.  EYES: Pupils equal, round, reactive to light. Extraocular muscles intact. No scleral icterus.  MOUTH: Moist mucosal membrane. Dentition intact. No abscess noted.  EAR, NOSE, THROAT: Clear without exudates. No external lesions.  NECK: Supple.  No thyromegaly. No nodules. No JVD.  PULMONARY: Clear to ascultation, without wheeze rails or rhonci. No use of accessory muscles, Good respiratory effort. good air entry bilaterally CHEST: Nontender to palpation.  CARDIOVASCULAR: S1 and S2. Regular rate and rhythm. No murmurs, rubs, or gallops. No edema. Pedal pulses 2+ bilaterally.  GASTROINTESTINAL: Soft, nontender, nondistended. No masses. Positive bowel sounds. No hepatosplenomegaly.  MUSCULOSKELETAL: No swelling,  clubbing, or edema. Range of motion full in all extremities.  NEUROLOGIC: Cranial nerves II through XII are intact. No gross focal neurological deficits. Sensation intact. Reflexes intact.  SKIN: No ulceration, lesions, rashes, or cyanosis. Skin warm and dry. Turgor intact.  PSYCHIATRIC: Mood, affecblunted . The patient is awake, alert and oriented to self. Insight, judgment poor.    LABORATORY PANEL:   CBC  Recent Labs Lab 02/05/15 1822  WBC 14.5*  HGB 11.1*  HCT 33.3*  PLT 230   ------------------------------------------------------------------------------------------------------------------  Chemistries   Recent Labs Lab 02/05/15 1822 02/05/15 1828  NA 132*  --   K <2.0*  --   CL 95*  --   CO2 29  --   GLUCOSE 212*  --   BUN 21*  --   CREATININE 0.94  --   CALCIUM 7.8*  --   MG  --  1.8   ------------------------------------------------------------------------------------------------------------------  Cardiac Enzymes No results for input(s): TROPONINI in the last 168 hours. ------------------------------------------------------------------------------------------------------------------  RADIOLOGY:  Dg Chest Portable 1 View  02/05/2015  CLINICAL DATA:  Cough. EXAM: PORTABLE CHEST 1 VIEW COMPARISON:  07/11/2011 FINDINGS: Heart size and pulmonary vascularity are within normal limits. Tortuosity and calcification of the thoracic aorta. Minimal scarring at the left base laterally. No acute osseous abnormality. IMPRESSION: No active disease. Electronically Signed   By: Francene Boyers M.D.   On: 02/05/2015 19:04    EKG:   Orders placed or performed during the hospital encounter of 02/05/15  . ED EKG  . ED EKG  . EKG 12-Lead  . EKG 12-Lead    IMPRESSION AND PLAN:   77 -year-old Caucasian female history of dementia who is presenting with worsening mental status  1. Urinary tract infection with encephalopathy: Ceftriaxone follow culture data 2. Hypokalemia:  Long-standing history of intermittent diarrhea secondary to irritable bowel syndrome, replace potassium and check magnesium level III. Dementia: Continue Namenda 4. Venous thromboembolism prophylactic: Heparin subcutaneous    All the records are reviewed and case discussed with ED provider. Management plans discussed with the patient, family and they are in agreement.  CODE STATUSfull TOTAL TIME TAKING CARE OF THIS PATIENT:  35 minutes.    Telesha Deguzman,  Mardi Mainland.D on 02/05/2015 at 8:51 PM  Between 7am to 6pm - Pager - [phone]  After 6pm: House Pager: - [phone]  [facility]  Office  [phone]  CC: Primary care physician; Colleen Gala, MD

## 2015-02-06 ENCOUNTER — Inpatient Hospital Stay: Payer: Medicare Other

## 2015-02-06 ENCOUNTER — Inpatient Hospital Stay
Admit: 2015-02-06 | Discharge: 2015-02-06 | Disposition: A | Discharge status: Home / Self Care | Payer: Medicare Other | Attending: Internal Medicine | Admitting: Internal Medicine

## 2015-02-06 ENCOUNTER — Encounter: Payer: Self-pay | Admitting: Radiology

## 2015-02-06 LAB — BASIC METABOLIC PANEL
ANION GAP: 8 (ref 5–15)
Anion gap: 11 (ref 5–15)
BUN: 22 mg/dL — AB (ref 6–20)
BUN: 21 mg/dL — ABNORMAL HIGH (ref 6–20)
CALCIUM: 8.3 mg/dL — AB (ref 8.9–10.3)
CALCIUM: 7.8 mg/dL — AB (ref 8.9–10.3)
CHLORIDE: 101 mmol/L (ref 101–111)
CO2: 30 mmol/L (ref 22–32)
CO2: 28 mmol/L (ref 22–32)
CREATININE: 0.99 mg/dL (ref 0.44–1.00)
CREATININE: 0.97 mg/dL (ref 0.44–1.00)
Chloride: 101 mmol/L (ref 101–111)
GFR calc Af Amer: 59 mL/min — ABNORMAL LOW (ref >60)
GFR calc non Af Amer: 49 mL/min — ABNORMAL LOW (ref >60)
GFR calc non Af Amer: 51 mL/min — ABNORMAL LOW (ref >60)
GFR, EST AFRICAN AMERICAN: 57 mL/min — AB (ref >60)
GLUCOSE: 110 mg/dL — AB (ref 65–99)
Glucose, Bld: 146 mg/dL — ABNORMAL HIGH (ref 65–99)
Potassium: 2.7 mmol/L — CL (ref 3.5–5.1)
Potassium: 3.2 mmol/L — ABNORMAL LOW (ref 3.5–5.1)
SODIUM: 142 mmol/L (ref 135–145)
Sodium: 137 mmol/L (ref 135–145)

## 2015-02-06 LAB — CBC
HEMATOCRIT: 31.5 % — AB (ref 35.0–47.0)
HEMOGLOBIN: 10.3 g/dL — AB (ref 12.0–16.0)
MCH: 27 pg (ref 26.0–34.0)
MCHC: 32.6 g/dL (ref 32.0–36.0)
MCV: 82.8 fL (ref 80.0–100.0)
Platelets: 223 10*3/uL (ref 150–440)
RBC: 3.8 MIL/uL (ref 3.80–5.20)
RDW: 16.8 % — ABNORMAL HIGH (ref 11.5–14.5)
WBC: 13.3 10*3/uL — ABNORMAL HIGH (ref 3.6–11.0)

## 2015-02-06 LAB — TROPONIN I
TROPONIN I: 1.45 ng/mL — AB (ref <0.031)
Troponin I: 0.9 ng/mL — ABNORMAL HIGH (ref <0.031)

## 2015-02-06 LAB — APTT: aPTT: 42 seconds — ABNORMAL HIGH (ref 24–36)

## 2015-02-06 LAB — PROTIME-INR
INR: 1.38
Prothrombin Time: 17.1 seconds — ABNORMAL HIGH (ref 11.4–15.0)

## 2015-02-06 MED ORDER — HEPARIN BOLUS VIA INFUSION
3000.0000 [IU] | Freq: Once | INTRAVENOUS | Status: AC
  Administered 2015-02-06: 3000 [IU] via INTRAVENOUS
  Filled 2015-02-06: qty 3000

## 2015-02-06 MED ORDER — POTASSIUM CHLORIDE 10 MEQ/100ML IV SOLN
10.0000 meq | INTRAVENOUS | Status: AC
  Administered 2015-02-06 (×4): 10 meq via INTRAVENOUS
  Filled 2015-02-06 (×4): qty 100

## 2015-02-06 MED ORDER — POTASSIUM CHLORIDE CRYS ER 20 MEQ PO TBCR
40.0000 meq | EXTENDED_RELEASE_TABLET | ORAL | Status: AC
  Administered 2015-02-06: 40 meq via ORAL
  Filled 2015-02-06: qty 2

## 2015-02-06 MED ORDER — HEPARIN (PORCINE) IN NACL 100-0.45 UNIT/ML-% IJ SOLN
1000.0000 [IU]/h | INTRAMUSCULAR | Status: DC
  Administered 2015-02-06 – 2015-02-07 (×2): 1000 [IU]/h via INTRAVENOUS
  Filled 2015-02-06 (×3): qty 250

## 2015-02-06 MED ORDER — IOHEXOL 350 MG/ML SOLN
75.0000 mL | Freq: Once | INTRAVENOUS | Status: AC | PRN
  Administered 2015-02-06: 75 mL via INTRAVENOUS

## 2015-02-06 NOTE — Progress Notes (Signed)
Pt with elevated troponin of 0.90 Dr. Allena KatzPatel notified and no new orders.

## 2015-02-06 NOTE — Care Management Note (Addendum)
Case Management Note  Patient Details  Name: [name] MRN: [id number] Date of Birth: 06/20/1924  Subjective/Objective:       82yo [name] was admitted 02/05/15 with UTI, Hypokalemia and hypoxia of unknown origin. Hx: Dementia. Resides with her daughter. PCP=Dr Rolm GalaHeidi Grandis, Pharmacy=[facility] in [place]. [facility] includes a rolling walker and a wheelchair. No current home oxygen. [facility]-PT was unable to work with [name] today per K=2.7 and the cut off point for PT therapy is <3.2. No current home health services.Provided daughter with a list of home health providers. Daughter reports that she wants to have [facility] home services for her Mom but that [facility] told her that [name] does not meet [facility] criteria. Discussed with Gweneth DimitriLisa Jacobs CM who will ask Dayna BarkerKaren Robertson to assess [name] for appropriateness for [facility] home services when Clydie BraunKaren returns to work tomorrow. Case management will follow for discharge planning.              Action/Plan:   Expected Discharge Date:                  Expected Discharge Plan:     In-House Referral:     Discharge planning Services     Post Acute Care Choice:    Choice offered to:     DME Arranged:    DME Agency:     HH Arranged:    HH Agency:     Status of Service:     Medicare Important Message Given:  Yes Date Medicare IM Given:    Medicare IM give by:    Date Additional Medicare IM Given:    Additional Medicare Important Message give by:     If discussed at Long Length of Stay Meetings, dates discussed:    Additional Comments:  Jearldine Cassady A, RN 02/06/2015, 2:36 PM

## 2015-02-06 NOTE — Progress Notes (Signed)
ANTICOAGULATION CONSULT NOTE - Initial Consult  Pharmacy Consult for Heparin Drip Indication: pulmonary embolus  Allergies  Allergen Reactions  . Azithromycin Itching  . Bactrim [Sulfamethoxazole-Trimethoprim]   . Other Nausea And Vomiting  . Penicillins Swelling    Has patient had a PCN reaction causing immediate rash, facial/tongue/throat swelling, SOB or lightheadedness with hypotension: Yes Has patient had a PCN reaction causing severe rash involving mucus membranes or skin necrosis: No Has patient had a PCN reaction that required hospitalization No Has patient had a PCN reaction occurring within the last 10 years: Yes If all of the above answers are "NO", then may proceed with Cephalosporin use.  . Sulfa Antibiotics   . Vibramycin [Doxycycline Calcium]     Patient Measurements: Height: 5\' 2"  (157.5 cm) Weight: 140 lb (63.504 kg) IBW/kg (Calculated) : 50.1 Heparin Dosing Weight: 62.9 kg  Vital Signs: Temp: 98.4 F (36.9 C) (01/02 1540) Temp Source: Oral (01/02 1540) BP: 113/61 mmHg (01/02 1540) Pulse Rate: 74 (01/02 1540)  Labs:  Recent Labs  02/05/15 1822 02/06/15 0503 02/06/15 1506 02/06/15 1603  HGB 11.1*  --   --  10.3*  HCT 33.3*  --   --  31.5*  PLT 230  --   --  223  APTT  --   --   --  42*  LABPROT  --   --   --  17.1*  INR  --   --   --  1.38  CREATININE 0.94 0.99 0.97  --   TROPONINI  --   --  1.45*  --     Estimated Creatinine Clearance: 35.1 mL/min (by C-G formula based on Cr of 0.97).   Medical History: Past Medical History  Diagnosis Date  . Vaginal bleeding   . Urethra, polyp   . Blood in stool   . Hematuria   . IBS (irritable bowel syndrome)   . Eczema   . Insomnia   . Osteopenia   . Anterolisthesis   . Dementia   . LVH (left ventricular hypertrophy) due to hypertensive disease     Medications:  Scheduled:  . antiseptic oral rinse  7 mL Mouth Rinse BID  . aspirin  81 mg Oral Daily  . cefTRIAXone (ROCEPHIN)  IV  1 g  Intravenous Q24H  . folic acid  1 mg Oral Daily  . memantine  10 mg Oral Daily  . QUEtiapine  25 mg Oral QHS   Infusions:  . 0.9 % NaCl with KCl 20 mEq / L 125 mL/hr at 02/06/15 1409  . heparin 1,000 Units/hr (02/06/15 1626)    Assessment: Pharmacy consulted to initiate and titrate heparin drip in an 82 yo female with pulmonary emboli.    Est CrCl~35.1 mL/min, plts: 223, Hgb: 10.3, aPTT: 42, INR: 1.38  Goal of Therapy:  Heparin level 0.3-0.7 units/ml Monitor platelets by anticoagulation protocol: Yes   Plan:  Discontinue heparin 5000 units subq q8h.  Give 3000 units bolus x 1 Start heparin infusion at 1000 units/hr Check anti-Xa level in 8 hours and daily while on heparin Continue to monitor H&H and platelets  Pharmacy will continue to follo.   Mihira Tozzi G 02/06/2015,5:59 PM

## 2015-02-06 NOTE — Plan of Care (Signed)
Problem: Bowel/Gastric: Goal: Will not experience complications related to bowel motility Outcome: Progressing Patient is alert and oriented. VSS. IV fluids infusing. IV potassium given. No complaints of pain. Telemetry monitored.

## 2015-02-06 NOTE — Progress Notes (Signed)
Made Dr Ardeen GarlandVaickukte aware of elevated troponin of 1.45 and confirmed her knowledge of bilateral PE. Orders received to for serial troponin's.

## 2015-02-06 NOTE — Progress Notes (Signed)
[facility] - [place] at [facility]   PATIENT NAME: [name]    MR#:  [id number]  DATE OF BIRTH:  1924-07-26  SUBJECTIVE:  CHIEF COMPLAINT:   Chief Complaint  Patient presents with  . Cough  . Fever  . Urinary Tract Infection   the patient is a 82 year old Caucasian female with past medical history significant for history of dementia presented behavioral disturbance who presents to the hospital with complaints of altered mental status, confusion as well as weakness, she also complained of subjective fevers and chills. In emergency room, she was noted to have pyuria, concerning for acute cystitis without hematuria and was admitted to the hospital. Patient's labs revealed severe hypokalemia and leukocytosis. Chest x-ray was unremarkable, although patient complains of some chest pains at present, inability to take deep breath because of pain. She is on 4 L of oxygen through nasal cannula, saturating at 96%.   Review of Systems  Constitutional: Negative for fever, chills and weight loss.  HENT: Negative for congestion.   Eyes: Negative for blurred vision and double vision.  Respiratory: Negative for cough, sputum production, shortness of breath and wheezing.   Cardiovascular: Positive for chest pain. Negative for palpitations, orthopnea, leg swelling and PND.  Gastrointestinal: Negative for nausea, vomiting, abdominal pain, diarrhea, constipation and blood in stool.  Genitourinary: Negative for dysuria, urgency, frequency and hematuria.  Musculoskeletal: Negative for falls.  Neurological: Negative for dizziness, tremors, focal weakness and headaches.  Endo/Heme/Allergies: Does not bruise/bleed easily.  Psychiatric/Behavioral: Negative for depression. The patient does not have insomnia.     VITAL SIGNS: Blood pressure 100/54, pulse 74, temperature 98 F (36.7 C), temperature source Oral, resp. rate 20, height 5\' 2"  (1.575 m), weight 63.504 kg (140 lb), SpO2 96  %.  PHYSICAL EXAMINATION:   GENERAL:  82 y.o.-year-old patient lying in the bed in mild-to-moderate respiratory distress. Chronically sick looking  EYES: Pupils equal, round, reactive to light and accommodation. No scleral icterus. Extraocular muscles intact.  HEENT: Head atraumatic, normocephalic. Oropharynx and nasopharynx clear.  NECK:  Supple, no jugular venous distention. No thyroid enlargement, no tenderness.  LUNGS: Diminished breath sounds bilaterally, no wheezing, rales,rhonchi or crepitation. No use of accessory muscles of respiration.  CARDIOVASCULAR: S1, S2 normal. No murmurs, rubs, or gallops.  ABDOMEN: Soft, nontender, nondistended. Bowel sounds present. No organomegaly or mass.  EXTREMITIES: No pedal edema, cyanosis, or clubbing.  NEUROLOGIC: Cranial nerves II through XII are intact. Muscle strength 5/5 in all extremities. Sensation intact. Gait not checked.  PSYCHIATRIC: The patient is alert and oriented x 3.  SKIN: No obvious rash, lesion, or ulcer.   ORDERS/RESULTS REVIEWED:   CBC  Recent Labs Lab 02/05/15 1822  WBC 14.5*  HGB 11.1*  HCT 33.3*  PLT 230  MCV 82.8  MCH 27.7  MCHC 33.4  RDW 16.9*   ------------------------------------------------------------------------------------------------------------------  Chemistries   Recent Labs Lab 02/05/15 1822 02/05/15 1828 02/06/15 0503  NA 132*  --  142  K <2.0*  --  2.7*  CL 95*  --  101  CO2 29  --  30  GLUCOSE 212*  --  146*  BUN 21*  --  22*  CREATININE 0.94  --  0.99  CALCIUM 7.8*  --  8.3*  MG  --  1.8  --    ------------------------------------------------------------------------------------------------------------------ estimated creatinine clearance is 34.4 mL/min (by C-G formula based on Cr of 0.99). ------------------------------------------------------------------------------------------------------------------ No results for input(s): TSH, T4TOTAL, T3FREE, THYROIDAB in the last 72  hours.  Invalid input(s): FREET3  Cardiac Enzymes No results for input(s): CKMB, TROPONINI, MYOGLOBIN in the last 168 hours.  Invalid input(s): CK ------------------------------------------------------------------------------------------------------------------ Invalid input(s): POCBNP ---------------------------------------------------------------------------------------------------------------  RADIOLOGY: Dg Chest Portable 1 View  02/05/2015  CLINICAL DATA:  Cough. EXAM: PORTABLE CHEST 1 VIEW COMPARISON:  07/11/2011 FINDINGS: Heart size and pulmonary vascularity are within normal limits. Tortuosity and calcification of the thoracic aorta. Minimal scarring at the left base laterally. No acute osseous abnormality. IMPRESSION: No active disease. Electronically Signed   By: Francene BoyersJames  Maxwell M.D.   On: 02/05/2015 19:04    EKG:  Orders placed or performed during the hospital encounter of 02/05/15  . ED EKG  . ED EKG  . EKG 12-Lead  . EKG 12-Lead    ASSESSMENT AND PLAN:  Principal Problem:   Hypokalemia 1. Acute respiratory failure with hypoxia, unknown etiology. Getting CT angiogram of the chest to rule out pulmonary embolism 2. Hypotension, advance IV fluids for low blood pressure readings closely. Could be related to infection 3. Acute cystitis without hematuria, continue Rocephin.  Awaiting for urinary cultures. Cultures are pending as well 4. Pleuritic chest pain, CT angiogram as above, checking cardiac enzymes 5. Hypokalemia due to intermittent diarrhea, supplementing intravenously, follow potassium level later today.  6. IBS was intermittent diarrhea and constipation, long discussion this patient's family in regards to MiraLAX use spent approximately 10-15 minutes in discussion alone.     Management plans discussed with the patient, family and they are in agreement.   DRUG ALLERGIES:  Allergies  Allergen Reactions  . Azithromycin Itching  . Bactrim  [Sulfamethoxazole-Trimethoprim]   . Other Nausea And Vomiting  . Penicillins Swelling    Has patient had a PCN reaction causing immediate rash, facial/tongue/throat swelling, SOB or lightheadedness with hypotension: Yes Has patient had a PCN reaction causing severe rash involving mucus membranes or skin necrosis: No Has patient had a PCN reaction that required hospitalization No Has patient had a PCN reaction occurring within the last 10 years: Yes If all of the above answers are "NO", then may proceed with Cephalosporin use.  . Sulfa Antibiotics   . Vibramycin [Doxycycline Calcium]     CODE STATUS:     Code Status Orders        Start     Ordered   02/05/15 2028  Full code   Continuous     02/05/15 2028    Advance Directive Documentation        Most Recent Value   Type of Advance Directive  Healthcare Power of Attorney   Pre-existing out of facility DNR order (yellow form or pink MOST form)     "MOST" Form in Place?        TOTAL TIME TAKING CARE OF THIS PATIENT: 50 minutes.    Katharina CaperVAICKUTE,Jessel Gettinger M.D on 02/06/2015 at 1:59 PM  Between 7am to 6pm - Pager - [phone]  After 6pm go to [email or web address] - password [email or web address] [facility]  [facility]  Office  [phone]  CC: Primary care physician; Rolm GalaGRANDIS, HEIDI, MD

## 2015-02-06 NOTE — Progress Notes (Signed)
PT Cancellation Note  Patient Details Name: [name] MRN: [id number] DOB: 02/07/1924   Cancelled Treatment:    Reason Eval/Treat Not Completed: Medical issues which prohibited therapy. Patient with K+ level of 2.7 today, per PT guidelines PT will not see patient with K+ below 3.2. Given her hypokalemia today, PT will hold mobility evaluation until patient is medically appropriate to fully participate in mobility evaluation.   Kerin RansomPatrick A McNamara, PT, DPT    02/06/2015, 9:36 AM

## 2015-02-06 NOTE — Progress Notes (Signed)
*  PRELIMINARY RESULTS* Echocardiogram 2D Echocardiogram has been performed.  [name] 02/06/2015, 6:05 PM

## 2015-02-06 NOTE — Care Management Important Message (Signed)
Important Message  Patient Details  Name: Colleen QualeDorothy L Wolak MRN: [id number] Date of Birth: 05/23/1924   Medicare Important Message Given:  Yes    Bravery Ketcham A, RN 02/06/2015, 8:45 AM

## 2015-02-06 NOTE — Consult Note (Signed)
[facility]  CARDIOLOGY CONSULT NOTE  Patient ID: Colleen QualeDorothy L Atchley MRN: [id number] DOB/AGE: 80/10/1924 80 y.o.  Admit date: 02/05/2015 Referring Physician Dr. Winona LegatoVaickute Primary Physician   Primary Cardiologist   Reason for Consultation abnormal troponin  HPI: Pt is a 82 yo female with history of dementia, ibs who was brought to the er with one day history of worsening mental status. She was noted ot have bilateral pulmoonary emboli as well as uti. Echo revealed near normal lv function with evidence in increased rv size and pressure with estimated rvsp of 38mmHg. She also was noted to have elevated serum troponin. She is a difficult historian and unable to give much history,   ROS Review of Systems - History obtained from chart review and unobtainable from patient due to mental status   Past Medical History  Diagnosis Date  . Vaginal bleeding   . Urethra, polyp   . Blood in stool   . Hematuria   . IBS (irritable bowel syndrome)   . Eczema   . Insomnia   . Osteopenia   . Anterolisthesis   . Dementia   . LVH (left ventricular hypertrophy) due to hypertensive disease     History reviewed. No pertinent family history.  Social History   Social History  . Marital Status: Widowed    Spouse Name: N/A  . Number of Children: N/A  . Years of Education: N/A   Occupational History  . Not on file.   Social History Main Topics  . Smoking status: Never Smoker   . Smokeless tobacco: Not on file  . Alcohol Use: No  . Drug Use: No  . Sexual Activity: No   Other Topics Concern  . Not on file   Social History Narrative    History reviewed. No pertinent past surgical history.   Prescriptions prior to admission  Medication Sig Dispense Refill Last Dose  . aspirin 81 MG chewable tablet Chew by mouth.   02/05/2015 at Unknown time  . cyanocobalamin (,VITAMIN B-12,) 1000 MCG/ML injection Inject 1,000 mcg into the muscle every 30 (thirty) days.   Past  Month at Unknown time  . dicyclomine (BENTYL) 20 MG tablet Take 0.5 mg by mouth 3 (three) times daily as needed for spasms.    02/05/2015 at Unknown time  . folic acid (FOLVITE) 1 MG tablet Take 1 mg by mouth daily.    02/05/2015 at Unknown time  . Iron-Vitamins (GERITOL COMPLETE) TABS Take 1 tablet by mouth daily.    02/05/2015 at Unknown time  . ketoconazole (NIZORAL) 2 % cream Apply 1 application topically daily.    02/05/2015 at Unknown time  . Magnesium 250 MG TABS Take 1 tablet by mouth daily.   02/05/2015 at Unknown time  . memantine (NAMENDA) 10 MG tablet Take 10 mg by mouth 2 (two) times daily.    02/05/2015 at Unknown time  . nitrofurantoin, macrocrystal-monohydrate, (MACROBID) 100 MG capsule Take 100 mg by mouth 2 (two) times daily.   02/05/2015 at Unknown time  . PARoxetine (PAXIL) 10 MG tablet Take 10 mg by mouth daily.    02/05/2015 at Unknown time  . QUEtiapine (SEROQUEL) 25 MG tablet Take 25 mg by mouth at bedtime.    02/04/2015 at Unknown time    Physical Exam: Blood pressure 130/67, pulse 80, temperature 98.3 F (36.8 C), temperature source Oral, resp. rate 16, height 5\' 2"  (1.575 m), weight 63.504 kg (140 lb), SpO2 96 %.    General  appearance: cooperative Resp: clear to auscultation bilaterally Cardio: regular rate and rhythm GI: soft, non-tender; bowel sounds normal; no masses,  no organomegaly Extremities: extremities normal, atraumatic, no cyanosis or edema Neurologic: Grossly normal Labs:   Lab Results  Component Value Date   WBC 13.3* 02/06/2015   HGB 10.3* 02/06/2015   HCT 31.5* 02/06/2015   MCV 82.8 02/06/2015   PLT 223 02/06/2015    Recent Labs Lab 02/06/15 1506  NA 137  K 3.2*  CL 101  CO2 28  BUN 21*  CREATININE 0.97  CALCIUM 7.8*  GLUCOSE 110*   Lab Results  Component Value Date   CKMB 2.1 01/15/2012   TROPONINI 0.90* 02/06/2015      Radiology: chest ct c/w bilateral pulmonary emboli EKG: no ischemia  ASSESSMENT AND PLAN:  Pt with bilateral pulmonary  emboli with evidence of mild elevated rvsp scondary to pe. Elevated troponin is most likely due to the pulmonary emboli as is the chest pain. Doubt acs. Not nstemi. Conitnue with heparin. Hemodynamically stable. Will need long term anticoagulaation after heparin.  Signed: Dalia Heading MD, [facility] 02/06/2015, 8:46 PM

## 2015-02-07 ENCOUNTER — Inpatient Hospital Stay: Payer: Medicare Other

## 2015-02-07 DIAGNOSIS — E440 Moderate protein-calorie malnutrition: Secondary | ICD-10-CM

## 2015-02-07 LAB — BASIC METABOLIC PANEL
ANION GAP: 7 (ref 5–15)
BUN: 19 mg/dL (ref 6–20)
CO2: 25 mmol/L (ref 22–32)
Calcium: 7.7 mg/dL — ABNORMAL LOW (ref 8.9–10.3)
Chloride: 107 mmol/L (ref 101–111)
Creatinine, Ser: 0.85 mg/dL (ref 0.44–1.00)
GFR, EST NON AFRICAN AMERICAN: 59 mL/min — AB (ref >60)
GLUCOSE: 110 mg/dL — AB (ref 65–99)
Potassium: 3.5 mmol/L (ref 3.5–5.1)
SODIUM: 139 mmol/L (ref 135–145)

## 2015-02-07 LAB — CBC
HEMATOCRIT: 31.1 % — AB (ref 35.0–47.0)
HEMOGLOBIN: 10.1 g/dL — AB (ref 12.0–16.0)
MCH: 26.9 pg (ref 26.0–34.0)
MCHC: 32.6 g/dL (ref 32.0–36.0)
MCV: 82.4 fL (ref 80.0–100.0)
Platelets: 219 10*3/uL (ref 150–440)
RBC: 3.77 MIL/uL — ABNORMAL LOW (ref 3.80–5.20)
RDW: 17.1 % — ABNORMAL HIGH (ref 11.5–14.5)
WBC: 11.3 10*3/uL — ABNORMAL HIGH (ref 3.6–11.0)

## 2015-02-07 LAB — HEPARIN LEVEL (UNFRACTIONATED)
HEPARIN UNFRACTIONATED: 0.18 [IU]/mL — AB (ref 0.30–0.70)
Heparin Unfractionated: 0.31 IU/mL (ref 0.30–0.70)

## 2015-02-07 LAB — TROPONIN I: Troponin I: 1.18 ng/mL — ABNORMAL HIGH (ref <0.031)

## 2015-02-07 MED ORDER — PAROXETINE HCL 10 MG PO TABS
10.0000 mg | ORAL_TABLET | Freq: Every day | ORAL | Status: DC
  Administered 2015-02-08 – 2015-02-09 (×2): 10 mg via ORAL
  Filled 2015-02-07 (×3): qty 1

## 2015-02-07 MED ORDER — APIXABAN 5 MG PO TABS
10.0000 mg | ORAL_TABLET | Freq: Two times a day (BID) | ORAL | Status: DC
  Administered 2015-02-07 – 2015-02-09 (×5): 10 mg via ORAL
  Filled 2015-02-07 (×6): qty 2

## 2015-02-07 MED ORDER — MOMETASONE FURO-FORMOTEROL FUM 100-5 MCG/ACT IN AERO
2.0000 | INHALATION_SPRAY | Freq: Two times a day (BID) | RESPIRATORY_TRACT | Status: DC
  Administered 2015-02-07 – 2015-02-09 (×5): 2 via RESPIRATORY_TRACT
  Filled 2015-02-07: qty 8.8

## 2015-02-07 MED ORDER — HEPARIN (PORCINE) IN NACL 100-0.45 UNIT/ML-% IJ SOLN
1200.0000 [IU]/h | INTRAMUSCULAR | Status: DC
  Administered 2015-02-07: 1200 [IU]/h via INTRAVENOUS
  Filled 2015-02-07: qty 250

## 2015-02-07 MED ORDER — APIXABAN 5 MG PO TABS: 5.0000 mg | ORAL_TABLET | Freq: Two times a day (BID) | ORAL | Status: DC

## 2015-02-07 MED ORDER — MEMANTINE HCL 5 MG PO TABS
10.0000 mg | ORAL_TABLET | Freq: Two times a day (BID) | ORAL | Status: DC
  Administered 2015-02-08: 5 mg via ORAL
  Filled 2015-02-07: qty 2

## 2015-02-07 MED ORDER — HEPARIN BOLUS VIA INFUSION
1900.0000 [IU] | Freq: Once | INTRAVENOUS | Status: AC
  Administered 2015-02-07: 1900 [IU] via INTRAVENOUS
  Filled 2015-02-07: qty 1900

## 2015-02-07 NOTE — Progress Notes (Signed)
Spoke to Dr. Clint GuyHower about elevated troponin level. No new orders at this time.

## 2015-02-07 NOTE — Plan of Care (Signed)
Problem: Safety: Goal: Ability to remain free from injury will improve Outcome: Completed/Met Date Met:  02/07/15 No falls this shift.  Problem: Pain Managment: Goal: General experience of comfort will improve Outcome: Completed/Met Date Met:  02/07/15 No complaints of pain this shift.  Problem: Fluid Volume: Goal: Ability to maintain a balanced intake and output will improve Outcome: Progressing Still requiring iv fluids.

## 2015-02-07 NOTE — Progress Notes (Signed)
Dr Nemiah CommanderKalisetti made aware of patient having a thrombus in the lesser Saphenous vein. Update acknowledged.

## 2015-02-07 NOTE — Progress Notes (Signed)
ANTICOAGULATION CONSULT NOTE - Initial Consult  Pharmacy Consult for Heparin Drip Indication: pulmonary embolus  Allergies  Allergen Reactions  . Azithromycin Itching  . Bactrim [Sulfamethoxazole-Trimethoprim]   . Other Nausea And Vomiting  . Penicillins Swelling    Has patient had a PCN reaction causing immediate rash, facial/tongue/throat swelling, SOB or lightheadedness with hypotension: Yes Has patient had a PCN reaction causing severe rash involving mucus membranes or skin necrosis: No Has patient had a PCN reaction that required hospitalization No Has patient had a PCN reaction occurring within the last 10 years: Yes If all of the above answers are "NO", then may proceed with Cephalosporin use.  . Sulfa Antibiotics   . Vibramycin [Doxycycline Calcium]     Patient Measurements: Height: 5\' 2"  (157.5 cm) Weight: 140 lb (63.504 kg) IBW/kg (Calculated) : 50.1 Heparin Dosing Weight: 62.9 kg  Vital Signs: Temp: 98.3 F (36.8 C) (01/02 2353) Temp Source: Oral (01/02 2353) BP: 119/64 mmHg (01/02 2353) Pulse Rate: 83 (01/02 2353)  Labs:  Recent Labs  02/05/15 1822 02/06/15 0503 02/06/15 1506 02/06/15 1603 02/06/15 1826 02/07/15 0043  HGB 11.1*  --   --  10.3*  --   --   HCT 33.3*  --   --  31.5*  --   --   PLT 230  --   --  223  --   --   APTT  --   --   --  42*  --   --   LABPROT  --   --   --  17.1*  --   --   INR  --   --   --  1.38  --   --   HEPARINUNFRC  --   --   --   --   --  0.31  CREATININE 0.94 0.99 0.97  --   --   --   TROPONINI  --   --  1.45*  --  0.90*  --     Estimated Creatinine Clearance: 35.1 mL/min (by C-G formula based on Cr of 0.97).   Medical History: Past Medical History  Diagnosis Date  . Vaginal bleeding   . Urethra, polyp   . Blood in stool   . Hematuria   . IBS (irritable bowel syndrome)   . Eczema   . Insomnia   . Osteopenia   . Anterolisthesis   . Dementia   . LVH (left ventricular hypertrophy) due to hypertensive disease      Medications:  Scheduled:  . antiseptic oral rinse  7 mL Mouth Rinse BID  . aspirin  81 mg Oral Daily  . cefTRIAXone (ROCEPHIN)  IV  1 g Intravenous Q24H  . folic acid  1 mg Oral Daily  . memantine  10 mg Oral Daily  . QUEtiapine  25 mg Oral QHS   Infusions:  . 0.9 % NaCl with KCl 20 mEq / L 125 mL/hr at 02/07/15 0138  . heparin 1,000 Units/hr (02/06/15 1626)    Assessment: Pharmacy consulted to initiate and titrate heparin drip in an 82 yo female with pulmonary emboli.    Est CrCl~35.1 mL/min, plts: 223, Hgb: 10.3, aPTT: 42, INR: 1.38  Goal of Therapy:  Heparin level 0.3-0.7 units/ml Monitor platelets by anticoagulation protocol: Yes   Plan:  First heparin level therapeutic. Continue current rate and will confirm level in 8 hours.   Pharmacy will continue to follow  Carola FrostNathan A Elchanan Bob, Pharm.D., BCPS Clinical Pharmcist 02/07/2015,1:39 AM

## 2015-02-07 NOTE — Progress Notes (Signed)
[facility] - [place] [facility]   PATIENT NAME: [name]    MR#:  [id number]  DATE OF BIRTH:  Dec 04, 1924  SUBJECTIVE:  CHIEF COMPLAINT:   Chief Complaint  Patient presents with  . Cough  . Fever  . Urinary Tract Infection   -Admitted for change in mental status. Noted to have bilateral PE. -Complains of dyspnea, remains on 2-3 L oxygen.  REVIEW OF SYSTEMS:  Review of Systems  Constitutional: Positive for malaise/fatigue. Negative for fever and chills.  HENT: Negative for ear discharge, ear pain and nosebleeds.   Eyes: Negative for blurred vision.  Respiratory: Positive for shortness of breath. Negative for cough and wheezing.   Cardiovascular: Positive for chest pain. Negative for palpitations.       Pleuritic chest pain  Gastrointestinal: Negative for nausea, vomiting, abdominal pain, diarrhea and constipation.  Genitourinary: Negative for dysuria.  Neurological: Negative for dizziness, tremors, speech change, focal weakness, seizures and headaches.  Psychiatric/Behavioral: Negative for depression.    DRUG ALLERGIES:   Allergies  Allergen Reactions  . Azithromycin Itching  . Bactrim [Sulfamethoxazole-Trimethoprim]   . Other Nausea And Vomiting  . Penicillins Swelling    Has patient had a PCN reaction causing immediate rash, facial/tongue/throat swelling, SOB or lightheadedness with hypotension: Yes Has patient had a PCN reaction causing severe rash involving mucus membranes or skin necrosis: No Has patient had a PCN reaction that required hospitalization No Has patient had a PCN reaction occurring within the last 10 years: Yes If all of the above answers are "NO", then may proceed with Cephalosporin use.  . Sulfa Antibiotics   . Vibramycin [Doxycycline Calcium]     VITALS:  Blood pressure 115/60, pulse 72, temperature 98.6 F (37 C), temperature source Oral, resp. rate 17, height 5\' 2"  (1.575 m), weight 59.512 kg (131 lb 3.2 oz),  SpO2 97 %.  PHYSICAL EXAMINATION:  Physical Exam  GENERAL:  82 y.o.-year-old elderly patient lying in the bed with no acute distress.  EYES: Pupils equal, round, reactive to light and accommodation. No scleral icterus. Extraocular muscles intact.  HEENT: Head atraumatic, normocephalic. Oropharynx and nasopharynx clear.  NECK:  Supple, no jugular venous distention. No thyroid enlargement, no tenderness.  LUNGS: Normal breath sounds bilaterally, no wheezing, rales,rhonchi or crepitation. No use of accessory muscles of respiration. Decreased bibasilar breath sounds. Appears tachypneic, may be from anxiety. CARDIOVASCULAR: S1, S2 normal. No  rubs, or gallops. 3/6 systolic murmur is present ABDOMEN: Soft, nontender, nondistended. Bowel sounds present. No organomegaly or mass.  EXTREMITIES: No pedal edema, cyanosis, or clubbing.  NEUROLOGIC: Cranial nerves II through XII are intact. Muscle strength 5/5 in all extremities. Sensation intact. Gait not checked.  PSYCHIATRIC: The patient is alert and oriented x 2-3.  SKIN: No obvious rash, lesion, or ulcer.    LABORATORY PANEL:   CBC  Recent Labs Lab 02/07/15 0043  WBC 11.3*  HGB 10.1*  HCT 31.1*  PLT 219   ------------------------------------------------------------------------------------------------------------------  Chemistries   Recent Labs Lab 02/05/15 1828  02/07/15 0043  NA  --   < > 139  K  --   < > 3.5  CL  --   < > 107  CO2  --   < > 25  GLUCOSE  --   < > 110*  BUN  --   < > 19  CREATININE  --   < > 0.85  CALCIUM  --   < > 7.7*  MG 1.8  --   --   < > =  values in this interval not displayed. ------------------------------------------------------------------------------------------------------------------  Cardiac Enzymes  Recent Labs Lab 02/07/15 0043  TROPONINI 1.18*   ------------------------------------------------------------------------------------------------------------------  RADIOLOGY:  Ct Angio  Chest Pe W/cm &/or Wo Cm  02/06/2015  CLINICAL DATA:  Respiratory failure and hypoxia of unknown etiology. Pleuritic chest pain and hypotension. EXAM: CT ANGIOGRAPHY CHEST WITH CONTRAST TECHNIQUE: Multidetector CT imaging of the chest was performed using the standard protocol during bolus administration of intravenous contrast. Multiplanar CT image reconstructions and MIPs were obtained to evaluate the vascular anatomy. CONTRAST:  75mL OMNIPAQUE IOHEXOL 350 MG/ML SOLN COMPARISON:  None. FINDINGS: The pulmonary arteries are well opacified. There is some respiratory motion on the exam. The study is positive for pulmonary embolism bilaterally. In the right lower lobe, nonocclusive thrombus is identified extending into subsegmental branches. Based on appearance, this clot may be acute/ subacute in nature with some areas having a mural appearance suggesting that some of the pulmonary embolism may be chronic. On the left side, a smaller volume of nonocclusive thrombus is seen extending into lingular and lower lobe branches. At bifurcation points and within branch vessels, this thrombus appears more typical of subacute/chronic thrombus. There is associated atelectasis at the left lung base with a small left pleural effusion. A very small pleural effusion is present on the right. No overt pulmonary edema. No evidence of pulmonary masses, focal airspace consolidation or enlarged lymph nodes. The heart is enlarged. No evidence of pericardial fluid. The thoracic aorta is of normal caliber. There is some reflux of contrast into the intrahepatic IVC and hepatic veins consistent with a component of right heart failure. Review of the MIP images confirms the above findings. IMPRESSION: 1. Bilateral pulmonary embolism with nonocclusive thrombus in subsegmental right lower lobe branches. Based on appearance, this clot may be acute or subacute in age. On the left side, a smaller volume of nonocclusive thrombus is seen extending into  lower lobe and lingular branches. This thrombus may be older in age and has a recanalized/mural appearance. 2. Reflux of contrast into the intrahepatic IVC and hepatic veins is consistent with right heart failure. No overt pulmonary edema in the lungs. 3. Small bilateral pleural effusions, left greater than right. Electronically Signed   By: Irish Lack M.D.   On: 02/06/2015 15:30   US Venous Img Lower Bilateral  02/07/2015  CLINICAL DATA:  Swelling. EXAM: BILATERAL LOWER EXTREMITY VENOUS DOPPLER ULTRASOUND TECHNIQUE: Gray-scale sonography with graded compression, as well as color Doppler and duplex ultrasound were performed to evaluate the lower extremity deep venous systems from the level of the common femoral vein and including the common femoral, femoral, profunda femoral, popliteal and calf veins including the posterior tibial, peroneal and gastrocnemius veins when visible. The superficial great saphenous vein was also interrogated. Spectral Doppler was utilized to evaluate flow at rest and with distal augmentation maneuvers in the common femoral, femoral and popliteal veins. COMPARISON:  12/18/2010 FINDINGS: RIGHT LOWER EXTREMITY Common Femoral Vein: No evidence of thrombus. Normal compressibility, respiratory phasicity and response to augmentation. Saphenofemoral Junction: No evidence of thrombus. Normal compressibility and flow on color Doppler imaging. Profunda Femoral Vein: No evidence of thrombus. Normal compressibility and flow on color Doppler imaging. Femoral Vein: No evidence of thrombus. Normal compressibility, respiratory phasicity and response to augmentation. Popliteal Vein: No evidence of thrombus. Normal compressibility, respiratory phasicity and response to augmentation. Calf Veins: No evidence of thrombus. Normal compressibility and flow on color Doppler imaging. Superficial Great Saphenous Vein: No evidence of thrombus. Normal compressibility and  flow on color Doppler imaging. Other  Findings:  Thrombus in the lesser saphenous vein on the right. LEFT LOWER EXTREMITY Common Femoral Vein: No evidence of thrombus. Normal compressibility, respiratory phasicity and response to augmentation. Saphenofemoral Junction: No evidence of thrombus. Normal compressibility and flow on color Doppler imaging. Profunda Femoral Vein: No evidence of thrombus. Normal compressibility and flow on color Doppler imaging. Femoral Vein: No evidence of thrombus. Normal compressibility, respiratory phasicity and response to augmentation. Popliteal Vein: No evidence of thrombus. Normal compressibility, respiratory phasicity and response to augmentation. Calf Veins: No evidence of thrombus. Normal compressibility and flow on color Doppler imaging. Superficial Great Saphenous Vein: No evidence of thrombus. Normal compressibility and flow on color Doppler imaging. Other Findings:  None. IMPRESSION: 1.  No evidence of deep venous thrombosis. 2.  Thrombus in the lesser saphenous vein on the right. Electronically Signed   By: Maisie Fushomas  Register   On: 02/07/2015 15:30   Dg Chest Portable 1 View  02/05/2015  CLINICAL DATA:  Cough. EXAM: PORTABLE CHEST 1 VIEW COMPARISON:  07/11/2011 FINDINGS: Heart size and pulmonary vascularity are within normal limits. Tortuosity and calcification of the thoracic aorta. Minimal scarring at the left base laterally. No acute osseous abnormality. IMPRESSION: No active disease. Electronically Signed   By: Francene BoyersJames  Maxwell M.D.   On: 02/05/2015 19:04    EKG:   Orders placed or performed during the hospital encounter of 02/05/15  . ED EKG  . ED EKG  . EKG 12-Lead  . EKG 12-Lead    ASSESSMENT AND PLAN:   82 year old female with past medical history significant for IBS, insomnia, osteopenia, early dementia and LVH and hypertension presents to the hospital secondary to change in mental status.  #1 altered mental status-likely metabolic encephalopathy. -Probably triggered by hypoxia from her  PE. - seems to be at baseline currently.  #2 acute bilateral pulmonary embolism-nonocclusive clots noted in both lungs. -Remains on heparin drip today. Discontinue heparin drip and change to oral eliquis. -Dopplers of lower extremities ordered. Likely triggered by immobility.  #3 dyspnea-likely from PE, has clear breath sounds bilaterally. Also likely triggered by anxiety. -Remains on 2-3 L oxygen. Wean tolerated. -Continue inhalers.  #4 UTI-continue Rocephin at this time.  #5 depression and dementia-on Paxil and also Namenda. Also on Seroquel  #6 DVT prophylaxis-on eliquis.  Physical therapy consulted     All the records are reviewed and case discussed with Care Management/Social Workerr. Management plans discussed with the patient, family and they are in agreement.  CODE STATUS: Full code  TOTAL TIME TAKING CARE OF THIS PATIENT: 37 minutes.   POSSIBLE D/C IN 2-3 DAYS, DEPENDING ON CLINICAL CONDITION.   Enid BaasKALISETTI,Lyra Alaimo M.D on 02/07/2015 at 4:10 PM  Between 7am to 6pm - Pager - [phone]  After 6pm go to [email or web address] - password [email or web address]  [facility]  Office  [phone]  CC: Primary care physician; Rolm GalaGRANDIS, HEIDI, MD

## 2015-02-07 NOTE — Progress Notes (Signed)
ANTICOAGULATION CONSULT NOTE - Initial Consult  Pharmacy Consult for apixaban Indication: pulmonary embolus  Allergies  Allergen Reactions  . Azithromycin Itching  . Bactrim [Sulfamethoxazole-Trimethoprim]   . Other Nausea And Vomiting  . Penicillins Swelling    Has patient had a PCN reaction causing immediate rash, facial/tongue/throat swelling, SOB or lightheadedness with hypotension: Yes Has patient had a PCN reaction causing severe rash involving mucus membranes or skin necrosis: No Has patient had a PCN reaction that required hospitalization No Has patient had a PCN reaction occurring within the last 10 years: Yes If all of the above answers are "NO", then may proceed with Cephalosporin use.  . Sulfa Antibiotics   . Vibramycin [Doxycycline Calcium]    Patient Measurements: Height: 5\' 2"  (157.5 cm) Weight: 131 lb 3.2 oz (59.512 kg) IBW/kg (Calculated) : 50.1  Vital Signs: Temp: 98.6 F (37 C) (01/03 1112) Temp Source: Oral (01/03 1112) BP: 115/60 mmHg (01/03 1112) Pulse Rate: 72 (01/03 1112)  Labs:  Recent Labs  02/05/15 1822 02/06/15 0503 02/06/15 1506 02/06/15 1603 02/06/15 1826 02/07/15 0043 02/07/15 0855  HGB 11.1*  --   --  10.3*  --  10.1*  --   HCT 33.3*  --   --  31.5*  --  31.1*  --   PLT 230  --   --  223  --  219  --   APTT  --   --   --  42*  --   --   --   LABPROT  --   --   --  17.1*  --   --   --   INR  --   --   --  1.38  --   --   --   HEPARINUNFRC  --   --   --   --   --  0.31 0.18*  CREATININE 0.94 0.99 0.97  --   --  0.85  --   TROPONINI  --   --  1.45*  --  0.90* 1.18*  --     Estimated Creatinine Clearance: 36.2 mL/min (by C-G formula based on Cr of 0.85).  Assessment: Pharmacy consulted to dose apixaban in this 82 year old female with a pulmonary embolism. Patient was on a heparin drip which was discontinued this afternoon.   SCr = 0.85 CrCl = 36 mL/min Hgb = 10.1  Plan:  Start apixaban 10 mg PO BID x 7 days followed by apixaban  5 mg PO BID for PE treatment.  Pharmacy will continue to monitor renal function and CBC at least every 3 days. Thank you for the consult.  Jodelle RedMary M Jj Enyeart 02/07/2015,1:45 PM

## 2015-02-07 NOTE — Progress Notes (Signed)
PT Cancellation Note  Patient Details Name: Harvel QualeDorothy L Fury MRN: [id number] DOB: 12/12/1924   Cancelled Treatment:    Reason Eval/Treat Not Completed: Medical issues which prohibited therapy.  Pt found to have B PE on imaging 02/06/15.  Per PT protocol on PE's, will wait to see pt 48 hours after anticoagulation for PE initiated (will see pt after 1628 02/08/15).   Irving Burtonmily Rowan Blaker 02/07/2015, 8:28 AM Hendricks LimesEmily Lexani Corona, PT [phone]

## 2015-02-07 NOTE — Progress Notes (Signed)
Initial Nutrition Assessment  DOCUMENTATION CODES:   Non-severe (moderate) malnutrition in context of chronic illness  INTERVENTION:   Meals and Snacks: Cater to patient preferences; will send am snack of cottage cheese with fruit and homemade milkshake as 3pm snack Medical Food Supplement Therapy: will send Magic Cup BID for added nutrition as pt does not like Ensure/Boost products    NUTRITION DIAGNOSIS:   Malnutrition related to chronic illness as evidenced by energy intake < or equal to 75% for > or equal to 1 month, moderate depletions of muscle mass, mild depletion of body fat.  GOAL:   Patient will meet greater than or equal to 90% of their needs  MONITOR:    (Energy Intake, Digestive system, Anthropometrics, Electrolyte and Renal Profile)  REASON FOR ASSESSMENT:   Malnutrition Screening Tool    ASSESSMENT:   Pt admitted with AMS secondary to UTI and hypokalemia. Pt with long-standing h/o IBS with intermittent diarrhea.   Past Medical History  Diagnosis Date  . Vaginal bleeding   . Urethra, polyp   . Blood in stool   . Hematuria   . IBS (irritable bowel syndrome)   . Eczema   . Insomnia   . Osteopenia   . Anterolisthesis   . Dementia   . LVH (left ventricular hypertrophy) due to hypertensive disease      Diet Order:  Diet Heart Room service appropriate?: Yes; Fluid consistency:: Thin    Current Nutrition: Pt reports eating bites of breakfast this am  Food/Nutrition-Related History: Pt's family reports pt eating poorly for months, reports pt eating half a protein bar for breakfast, no lunch and 25-50% of dinner. Pt's family reports some days not eating at all and some times pt may eat one full meal. Pt reports not drinking supplements as she does not like.   Scheduled Medications:  . antiseptic oral rinse  7 mL Mouth Rinse BID  . aspirin  81 mg Oral Daily  . cefTRIAXone (ROCEPHIN)  IV  1 g Intravenous Q24H  . folic acid  1 mg Oral Daily  . memantine   10 mg Oral Daily  . QUEtiapine  25 mg Oral QHS    Continuous Medications:  . 0.9 % NaCl with KCl 20 mEq / L 125 mL/hr at 02/07/15 1032  . heparin 1,200 Units/hr (02/07/15 1046)     Electrolyte/Renal Profile and Glucose Profile:   Recent Labs Lab 02/05/15 1828 02/06/15 0503 02/06/15 1506 02/07/15 0043  NA  --  142 137 139  K  --  2.7* 3.2* 3.5  CL  --  101 101 107  CO2  --  30 28 25   BUN  --  22* 21* 19  CREATININE  --  0.99 0.97 0.85  CALCIUM  --  8.3* 7.8* 7.7*  MG 1.8  --   --   --   GLUCOSE  --  146* 110* 110*   Protein Profile: No results for input(s): ALBUMIN in the last 168 hours.   Gastrointestinal Profile: Last BM: 02/06/2015   Nutrition-Focused Physical Exam Findings: Nutrition-Focused physical exam completed. Findings are mild fat depletion, mild-moderate muscle depletion, and no edema.     Weight Change: Per family pt weight of 125lbs and then pt lost down to 112lbs in November but has been gaining some back. RD weighed pt at 131.2lbs on visit today.    Height:   Ht Readings from Last 1 Encounters:  02/07/15 5\' 2"  (1.575 m)    Weight:   Wt Readings  from Last 1 Encounters:  02/07/15 131 lb 3.2 oz (59.512 kg)     BMI:  Body mass index is 23.99 kg/(m^2).  Estimated Nutritional Needs:   Kcal:  BEE: 978kcals, TEE: (IF 1.1-1.3)(AF 1.2) 1291-1586kcals  Protein:  60-71g protein (1.0-1.2g/kg)  Fluid:  1488-1746mL of fluid (25-85mL/kg)  EDUCATION NEEDS:   No education needs identified at this time   MODERATE Care Level   Leda Quail, RD, LDN Pager [phone] Weekend/On-Call Pager [phone]

## 2015-02-07 NOTE — Progress Notes (Signed)
Pt with bilateral pulmonary embolus.rn spoke with dr Winona Legatovaickute who  Ordered heparin nomogram

## 2015-02-07 NOTE — Progress Notes (Signed)
ANTICOAGULATION CONSULT NOTE - Follow up  Pharmacy Consult for Heparin Drip Indication: pulmonary embolus  Allergies  Allergen Reactions  . Azithromycin Itching  . Bactrim [Sulfamethoxazole-Trimethoprim]   . Other Nausea And Vomiting  . Penicillins Swelling    Has patient had a PCN reaction causing immediate rash, facial/tongue/throat swelling, SOB or lightheadedness with hypotension: Yes Has patient had a PCN reaction causing severe rash involving mucus membranes or skin necrosis: No Has patient had a PCN reaction that required hospitalization No Has patient had a PCN reaction occurring within the last 10 years: Yes If all of the above answers are "NO", then may proceed with Cephalosporin use.  . Sulfa Antibiotics   . Vibramycin [Doxycycline Calcium]    Patient Measurements: Height: 5\' 2"  (157.5 cm) Weight: 140 lb (63.504 kg) IBW/kg (Calculated) : 50.1 Heparin Dosing Weight: 62.9 kg  Vital Signs: Temp: 97.9 F (36.6 C) (01/03 0820) Temp Source: Oral (01/03 0820) BP: 131/63 mmHg (01/03 0820) Pulse Rate: 75 (01/03 0820)  Labs:  Recent Labs  02/05/15 1822 02/06/15 0503 02/06/15 1506 02/06/15 1603 02/06/15 1826 02/07/15 0043 02/07/15 0855  HGB 11.1*  --   --  10.3*  --  10.1*  --   HCT 33.3*  --   --  31.5*  --  31.1*  --   PLT 230  --   --  223  --  219  --   APTT  --   --   --  42*  --   --   --   LABPROT  --   --   --  17.1*  --   --   --   INR  --   --   --  1.38  --   --   --   HEPARINUNFRC  --   --   --   --   --  0.31 0.18*  CREATININE 0.94 0.99 0.97  --   --  0.85  --   TROPONINI  --   --  1.45*  --  0.90* 1.18*  --     Estimated Creatinine Clearance: 40.1 mL/min (by C-G formula based on Cr of 0.85).  Assessment: Pharmacy consulted to initiate and titrate heparin drip in an 82 yo female with pulmonary emboli.    Est CrCl~35.1 mL/min, plts: 223, Hgb: 10.3, aPTT: 42, INR: 1.38  Goal of Therapy:  Heparin level 0.3-0.7 units/ml Monitor platelets by  anticoagulation protocol: Yes   Plan:  First heparin level therapeutic. Continue current rate and will confirm level in 8 hours.   1/3 heparin level @ 0900 was subtherapeutic at 0.18 units/mL on an infusion rate of 1000 units/hr (~16 units/kg/hr). Gave bolus of 1900 units (~30 units/kg) and increased infusion rate to 1200 units/hr (~19 units/kg/hr) per protocol. Heparin level ordered for 8 hours at 1900 this evening. CBC ordered with AM labs tomorrow.  Pharmacy will continue to follow, thank you for the consult.  Cindi CarbonMary M Darrly Loberg, Pharm.D. Clinical Pharmcist 02/07/2015,10:44 AM

## 2015-02-08 ENCOUNTER — Inpatient Hospital Stay: Payer: Medicare Other

## 2015-02-08 LAB — CBC WITH DIFFERENTIAL/PLATELET
BASOS ABS: 0.1 10*3/uL (ref 0–0.1)
BASOS PCT: 1 %
EOS ABS: 0.4 10*3/uL (ref 0–0.7)
EOS PCT: 6 %
HEMATOCRIT: 27.3 % — AB (ref 35.0–47.0)
Hemoglobin: 8.9 g/dL — ABNORMAL LOW (ref 12.0–16.0)
Lymphocytes Relative: 16 %
Lymphs Abs: 1.2 10*3/uL (ref 1.0–3.6)
MCH: 27 pg (ref 26.0–34.0)
MCHC: 32.6 g/dL (ref 32.0–36.0)
MCV: 82.9 fL (ref 80.0–100.0)
MONO ABS: 0.5 10*3/uL (ref 0.2–0.9)
Monocytes Relative: 6 %
NEUTROS ABS: 5.7 10*3/uL (ref 1.4–6.5)
Neutrophils Relative %: 71 %
PLATELETS: 250 10*3/uL (ref 150–440)
RBC: 3.3 MIL/uL — ABNORMAL LOW (ref 3.80–5.20)
RDW: 17.6 % — AB (ref 11.5–14.5)
WBC: 7.9 10*3/uL (ref 3.6–11.0)

## 2015-02-08 LAB — BASIC METABOLIC PANEL
Anion gap: 4 — ABNORMAL LOW (ref 5–15)
BUN: 16 mg/dL (ref 6–20)
CALCIUM: 7.8 mg/dL — AB (ref 8.9–10.3)
CO2: 25 mmol/L (ref 22–32)
Chloride: 112 mmol/L — ABNORMAL HIGH (ref 101–111)
Creatinine, Ser: 0.75 mg/dL (ref 0.44–1.00)
Glucose, Bld: 93 mg/dL (ref 65–99)
Potassium: 3.4 mmol/L — ABNORMAL LOW (ref 3.5–5.1)
SODIUM: 141 mmol/L (ref 135–145)

## 2015-02-08 LAB — URINE CULTURE: Culture: >=100000

## 2015-02-08 MED ORDER — LEVOFLOXACIN 250 MG PO TABS
250.0000 mg | ORAL_TABLET | Freq: Every day | ORAL | Status: DC
  Administered 2015-02-09: 250 mg via ORAL
  Filled 2015-02-08: qty 1

## 2015-02-08 MED ORDER — MEMANTINE HCL 5 MG PO TABS
5.0000 mg | ORAL_TABLET | Freq: Two times a day (BID) | ORAL | Status: DC
  Administered 2015-02-08 – 2015-02-09 (×2): 5 mg via ORAL
  Filled 2015-02-08 (×2): qty 1

## 2015-02-08 MED ORDER — LEVOFLOXACIN 250 MG PO TABS
250.0000 mg | ORAL_TABLET | Freq: Once | ORAL | Status: AC
  Administered 2015-02-08: 250 mg via ORAL
  Filled 2015-02-08: qty 1

## 2015-02-08 MED ORDER — POTASSIUM CHLORIDE CRYS ER 20 MEQ PO TBCR
40.0000 meq | EXTENDED_RELEASE_TABLET | Freq: Once | ORAL | Status: AC
  Administered 2015-02-08: 40 meq via ORAL
  Filled 2015-02-08: qty 2

## 2015-02-08 NOTE — Care Management (Signed)
PT pending.  Both daughters at bedside.  Daughter states "no matter what physical therapy says we are planning on taking her home".  States patient has been open with [facility] for Home health in the past.  Would like to use them again if patient qualifies, if not [facility]  Patient is on acute O2 and will need qualifying o2 sats and diagnosis.  Plan to discharge on Eliquis.  Will provide with 30 day free trial

## 2015-02-08 NOTE — Progress Notes (Signed)
[facility] - [place] [facility]   PATIENT NAME: Krishawna Stiefel    MR#:  [id number]  DATE OF BIRTH:  02-20-1924  SUBJECTIVE:  CHIEF COMPLAINT:   Chief Complaint  Patient presents with  . Cough  . Fever  . Urinary Tract Infection   -Appears much better today, more stronger and alert. Remains on 4L o2. - daughters at bedside.  REVIEW OF SYSTEMS:  Review of Systems  Constitutional: Positive for malaise/fatigue. Negative for fever and chills.  HENT: Negative for ear discharge, ear pain and nosebleeds.   Eyes: Negative for blurred vision.  Respiratory: Positive for shortness of breath. Negative for cough and wheezing.   Cardiovascular: Negative for chest pain and palpitations.       Pleuritic chest pain  Gastrointestinal: Negative for nausea, vomiting, abdominal pain, diarrhea and constipation.  Genitourinary: Negative for dysuria.  Neurological: Negative for dizziness, tremors, speech change, focal weakness, seizures and headaches.  Psychiatric/Behavioral: Negative for depression.    DRUG ALLERGIES:   Allergies  Allergen Reactions  . Azithromycin Itching  . Bactrim [Sulfamethoxazole-Trimethoprim]   . Other Nausea And Vomiting  . Penicillins Swelling    Has patient had a PCN reaction causing immediate rash, facial/tongue/throat swelling, SOB or lightheadedness with hypotension: Yes Has patient had a PCN reaction causing severe rash involving mucus membranes or skin necrosis: No Has patient had a PCN reaction that required hospitalization No Has patient had a PCN reaction occurring within the last 10 years: Yes If all of the above answers are "NO", then may proceed with Cephalosporin use.  . Sulfa Antibiotics   . Vibramycin [Doxycycline Calcium]     VITALS:  Blood pressure 130/57, pulse 79, temperature 98.1 F (36.7 C), temperature source Oral, resp. rate 17, height 5\' 2"  (1.575 m), weight 60.782 kg (134 lb), SpO2 98 %.  PHYSICAL EXAMINATION:   Physical Exam  GENERAL:  82 y.o.-year-old elderly patient lying in the bed with no acute distress.  EYES: Pupils equal, round, reactive to light and accommodation. No scleral icterus. Extraocular muscles intact.  HEENT: Head atraumatic, normocephalic. Oropharynx and nasopharynx clear.  NECK:  Supple, no jugular venous distention. No thyroid enlargement, no tenderness.  LUNGS: Normal breath sounds bilaterally, no wheezing, rales,rhonchi or crepitation. No use of accessory muscles of respiration. Decreased bibasilar breath sounds.  CARDIOVASCULAR: S1, S2 normal. No  rubs, or gallops. 3/6 systolic murmur is present ABDOMEN: Soft, nontender, nondistended. Bowel sounds present. No organomegaly or mass.  EXTREMITIES: No pedal edema, cyanosis, or clubbing.  NEUROLOGIC: Cranial nerves II through XII are intact. Muscle strength 5/5 in all extremities. Sensation intact. Gait not checked.  PSYCHIATRIC: The patient is alert and oriented x 2-3.  SKIN: No obvious rash, lesion, or ulcer.    LABORATORY PANEL:   CBC  Recent Labs Lab 02/08/15 0805  WBC 7.9  HGB 8.9*  HCT 27.3*  PLT 250   ------------------------------------------------------------------------------------------------------------------  Chemistries   Recent Labs Lab 02/05/15 1828  02/08/15 0805  NA  --   < > 141  K  --   < > 3.4*  CL  --   < > 112*  CO2  --   < > 25  GLUCOSE  --   < > 93  BUN  --   < > 16  CREATININE  --   < > 0.75  CALCIUM  --   < > 7.8*  MG 1.8  --   --   < > = values in this interval  not displayed. ------------------------------------------------------------------------------------------------------------------  Cardiac Enzymes  Recent Labs Lab 02/07/15 0043  TROPONINI 1.18*   ------------------------------------------------------------------------------------------------------------------  RADIOLOGY:  Dg Chest 2 View  02/08/2015  CLINICAL DATA:  Decreasing mental status, known pulmonary  embolism EXAM: CHEST - 2 VIEW COMPARISON:  02/05/15 FINDINGS: Cardiac shadow is mildly enlarged. Aortic calcifications are noted. The lungs are well aerated bilaterally with small pleural effusions slightly increased when compared with the prior exam. Mild basilar atelectasis is noted as well. No acute bony abnormality is seen. IMPRESSION: Bilateral pleural effusions slightly increased from the prior exam. Electronically Signed   By: Alcide Clever M.D.   On: 02/08/2015 13:22   US Venous Img Lower Bilateral  02/07/2015  CLINICAL DATA:  Swelling. EXAM: BILATERAL LOWER EXTREMITY VENOUS DOPPLER ULTRASOUND TECHNIQUE: Gray-scale sonography with graded compression, as well as color Doppler and duplex ultrasound were performed to evaluate the lower extremity deep venous systems from the level of the common femoral vein and including the common femoral, femoral, profunda femoral, popliteal and calf veins including the posterior tibial, peroneal and gastrocnemius veins when visible. The superficial great saphenous vein was also interrogated. Spectral Doppler was utilized to evaluate flow at rest and with distal augmentation maneuvers in the common femoral, femoral and popliteal veins. COMPARISON:  12/18/2010 FINDINGS: RIGHT LOWER EXTREMITY Common Femoral Vein: No evidence of thrombus. Normal compressibility, respiratory phasicity and response to augmentation. Saphenofemoral Junction: No evidence of thrombus. Normal compressibility and flow on color Doppler imaging. Profunda Femoral Vein: No evidence of thrombus. Normal compressibility and flow on color Doppler imaging. Femoral Vein: No evidence of thrombus. Normal compressibility, respiratory phasicity and response to augmentation. Popliteal Vein: No evidence of thrombus. Normal compressibility, respiratory phasicity and response to augmentation. Calf Veins: No evidence of thrombus. Normal compressibility and flow on color Doppler imaging. Superficial Great Saphenous Vein: No  evidence of thrombus. Normal compressibility and flow on color Doppler imaging. Other Findings:  Thrombus in the lesser saphenous vein on the right. LEFT LOWER EXTREMITY Common Femoral Vein: No evidence of thrombus. Normal compressibility, respiratory phasicity and response to augmentation. Saphenofemoral Junction: No evidence of thrombus. Normal compressibility and flow on color Doppler imaging. Profunda Femoral Vein: No evidence of thrombus. Normal compressibility and flow on color Doppler imaging. Femoral Vein: No evidence of thrombus. Normal compressibility, respiratory phasicity and response to augmentation. Popliteal Vein: No evidence of thrombus. Normal compressibility, respiratory phasicity and response to augmentation. Calf Veins: No evidence of thrombus. Normal compressibility and flow on color Doppler imaging. Superficial Great Saphenous Vein: No evidence of thrombus. Normal compressibility and flow on color Doppler imaging. Other Findings:  None. IMPRESSION: 1.  No evidence of deep venous thrombosis. 2.  Thrombus in the lesser saphenous vein on the right. Electronically Signed   By: Maisie Fus  Register   On: 02/07/2015 15:30    EKG:   Orders placed or performed during the hospital encounter of 02/05/15  . ED EKG  . ED EKG  . EKG 12-Lead  . EKG 12-Lead    ASSESSMENT AND PLAN:   82 year old female with past medical history significant for IBS, insomnia, osteopenia, early dementia and LVH and hypertension presents to the hospital secondary to change in mental status.  #1 altered mental status on presentation -likely metabolic encephalopathy. - resolved now -Probably triggered by hypoxia from her PE. - seems to be at baseline currently.  #2 acute bilateral pulmonary embolism-nonocclusive clots noted in both lungs. -changed  to oral eliquis. Will need trt for 12 moths atleast -Dopplers of lower  extremities with right saphenous vein clot . Likely triggered by immobility.  #3  dyspnea-likely from PE, has clear breath sounds bilaterally. Also likely triggered by anxiety. -Remains on 3-4 L oxygen. Wean as tolerated. - appears more relaxed today -Continue inhalers.  #4 UTI- cultures with Pseudomonas in the urine. - discontinue Rocephin and start on Levaquin..  #5 depression and dementia-on Paxil and also Namenda. Also on Seroquel  #6 DVT prophylaxis-on eliquis.  Physical therapy consulted. Daughters want to take her home no matter what physical therapy recommends. -Care management consult for physical therapy and home health     All the records are reviewed and case discussed with Care Management/Social Workerr. Management plans discussed with the patient, family and they are in agreement.  CODE STATUS: Full code  TOTAL TIME TAKING CARE OF THIS PATIENT: 37 minutes.   POSSIBLE D/C IN 1-2 DAYS, DEPENDING ON CLINICAL CONDITION.   Enid BaasKALISETTI,Mitul Hallowell M.D on 02/08/2015 at 3:53 PM  Between 7am to 6pm - Pager - [phone]  After 6pm go to [email or web address] - password [email or web address] [facility]  [facility]  Office  [phone]  CC: Primary care physician; Rolm GalaGRANDIS, HEIDI, MD

## 2015-02-08 NOTE — Progress Notes (Signed)
ANTIBIOTIC CONSULT NOTE - INITIAL  Pharmacy Consult for Levofloxacin Indication: UTI  Allergies  Allergen Reactions  . Azithromycin Itching  . Bactrim [Sulfamethoxazole-Trimethoprim]   . Other Nausea And Vomiting  . Penicillins Swelling    Has patient had a PCN reaction causing immediate rash, facial/tongue/throat swelling, SOB or lightheadedness with hypotension: Yes Has patient had a PCN reaction causing severe rash involving mucus membranes or skin necrosis: No Has patient had a PCN reaction that required hospitalization No Has patient had a PCN reaction occurring within the last 10 years: Yes If all of the above answers are "NO", then may proceed with Cephalosporin use.  . Sulfa Antibiotics   . Vibramycin [Doxycycline Calcium]     Patient Measurements: Height: 5\' 2"  (157.5 cm) Weight: 134 lb (60.782 kg) IBW/kg (Calculated) : 50.1   Vital Signs: Temp: 98.2 F (36.8 C) (01/04 0748) Temp Source: Oral (01/04 0748) BP: 110/52 mmHg (01/04 0748) Pulse Rate: 72 (01/04 0748) Intake/Output from previous day: 01/03 0701 - 01/04 0700 In: 2188 [P.O.:580; I.V.:1608] Out: -  Intake/Output from this shift:    Labs:  Recent Labs  02/06/15 1506 02/06/15 1603 02/07/15 0043 02/08/15 0805  WBC  --  13.3* 11.3* 7.9  HGB  --  10.3* 10.1* 8.9*  PLT  --  223 219 250  CREATININE 0.97  --  0.85 0.75   Estimated Creatinine Clearance: 41.7 mL/min (by C-G formula based on Cr of 0.75). No results for input(s): VANCOTROUGH, VANCOPEAK, VANCORANDOM, GENTTROUGH, GENTPEAK, GENTRANDOM, TOBRATROUGH, TOBRAPEAK, TOBRARND, AMIKACINPEAK, AMIKACINTROU, AMIKACIN in the last 72 hours.   Microbiology: Recent Results (from the past 720 hour(s))  Blood culture (routine x 2)     Status: None (Preliminary result)   Collection Time: 02/05/15  7:51 PM  Result Value Ref Range Status   Specimen Description BLOOD LEFT FACE  Final   Special Requests BOTTLES DRAWN AEROBIC AND ANAEROBIC 4CC  Final   Culture  NO GROWTH 2 DAYS  Final   Report Status PENDING  Incomplete  Blood culture (routine x 2)     Status: None (Preliminary result)   Collection Time: 02/05/15  7:51 PM  Result Value Ref Range Status   Specimen Description BLOOD RIGHT FACE  Final   Special Requests BOTTLES DRAWN AEROBIC AND ANAEROBIC 4CC  Final   Culture NO GROWTH 2 DAYS  Final   Report Status PENDING  Incomplete  Urine culture     Status: None   Collection Time: 02/05/15  8:55 PM  Result Value Ref Range Status   Specimen Description URINE, RANDOM  Final   Special Requests NONE  Final   Culture >=100,000 COLONIES/mL PSEUDOMONAS AERUGINOSA  Final   Report Status 02/08/2015 FINAL  Final   Organism ID, Bacteria PSEUDOMONAS AERUGINOSA  Final      Susceptibility   Pseudomonas aeruginosa - MIC*    CEFTAZIDIME 4 SENSITIVE Sensitive     CIPROFLOXACIN 0.5 SENSITIVE Sensitive     GENTAMICIN 8 INTERMEDIATE Intermediate     IMIPENEM 2 SENSITIVE Sensitive     PIP/TAZO Value in next row Sensitive      SENSITIVE8    LEVOFLOXACIN Value in next row Sensitive      SENSITIVE1    * >=100,000 COLONIES/mL PSEUDOMONAS AERUGINOSA    Medical History: Past Medical History  Diagnosis Date  . Vaginal bleeding   . Urethra, polyp   . Blood in stool   . Hematuria   . IBS (irritable bowel syndrome)   . Eczema   .  Insomnia   . Osteopenia   . Anterolisthesis   . Dementia   . LVH (left ventricular hypertrophy) due to hypertensive disease     Medications:  Scheduled:  . antiseptic oral rinse  7 mL Mouth Rinse BID  . apixaban  10 mg Oral BID   Followed by  . [START ON 02/14/2015] apixaban  5 mg Oral BID  . aspirin  81 mg Oral Daily  . folic acid  1 mg Oral Daily  . levofloxacin  250 mg Oral Once  . [START ON 02/09/2015] levofloxacin  250 mg Oral Daily  . memantine  10 mg Oral BID  . mometasone-formoterol  2 puff Inhalation BID  . PARoxetine  10 mg Oral Daily  . QUEtiapine  25 mg Oral QHS   Infusions:   Assessment: Pharmacy  consulted to dose Levaquin in an 82 yo female with UTI.  Culture/sensivities growing Pseudomonas sens to fluoroquinolones.    Est CrCl~41.7 mL/min  Goal of Therapy:  Resolution of infection  Plan:  Based on indication, will start patient on Levaquin 250 mg po daily.  Will continue to follow renal function for adjustments as warranted.   Mazikeen Hehn G 02/08/2015,11:16 AM

## 2015-02-08 NOTE — Care Management Important Message (Signed)
Important Message  Patient Details  Name: [name] MRN: [id number] Date of Birth: 10/17/1924   Medicare Important Message Given:  Yes    [name] 02/08/2015, 10:13 AM

## 2015-02-09 DIAGNOSIS — I2699 Other pulmonary embolism without acute cor pulmonale: Secondary | ICD-10-CM

## 2015-02-09 MED ORDER — LEVOFLOXACIN 250 MG PO TABS: 250.0000 mg | ORAL_TABLET | Freq: Every day | ORAL | Status: DC

## 2015-02-09 MED ORDER — MOMETASONE FURO-FORMOTEROL FUM 100-5 MCG/ACT IN AERO
2.0000 | INHALATION_SPRAY | Freq: Two times a day (BID) | RESPIRATORY_TRACT | Status: DC
Start: 2015-02-09 — End: 2021-04-24

## 2015-02-09 MED ORDER — APIXABAN 5 MG PO TABS: 10.0000 mg | ORAL_TABLET | Freq: Two times a day (BID) | ORAL | Status: DC

## 2015-02-09 MED ORDER — APIXABAN 5 MG PO TABS: 5.0000 mg | ORAL_TABLET | Freq: Two times a day (BID) | ORAL | Status: DC

## 2015-02-09 NOTE — Progress Notes (Signed)
[facility] Patient was admitted on 02/05/15 for weakness, secondary to hypokalemia ( 2.7) and confusion r/t a UTI.  She has a PMH of dementia, osteopenia, IBS, LVH d/t hypertensive disease. She was found on VQ scan to have bilateral pulmonary emboli, started on heparin protocol. She has required supplemental oxygen for her dyspnea, she is currently on room air. Per chart review, potassium was normal this morning. Venous ultrasound performed on 1/3 revealed a thrombus in the saphenous vein. Plan is for discharge today on Eliquis. Services of nursing and physical therapy have been requested. Patient does not at this time have any DME needs, she has a rolling walker and wheelchair in her home. She has 24 hour care giving and her daughter Gavin PoundDeborah will be in the home as well. [facility] information given to [employer]. Patient seen sitting up in the chair, alert and pleasant. Oriented x 2. Patient will go home by car. Information faxed to referral. She is a DNR code. Thank you for the opportunity to be involved in the care of this patient. Dayna BarkerKaren Robertson RN, BSN, [facility], [facility] L[facility] [phone] c

## 2015-02-09 NOTE — Progress Notes (Signed)
SATURATION QUALIFICATIONS: (This note is used to comply with regulatory documentation for home oxygen)  Patient Saturations on Room Air at Rest = 92%  Patient Saturations on Room Air while Ambulating = 90%   

## 2015-02-09 NOTE — Progress Notes (Signed)
Patient complaining of abdominal pain. Dr Clent RidgesWalsh paged.

## 2015-02-09 NOTE — Progress Notes (Signed)
Dr Clent RidgesWalsh verbally notified of patient complaining of abdominal pain, states she will assess the patient in a bit.

## 2015-02-09 NOTE — Care Management (Signed)
Patient discharging today.  PT recommending home health.  Family has selected [facility].  [facility]ly[facility]aren [facility] the [facility] and they are appropriate.  SN, PT, and Aide ordered.  Patient has aide services 12 hours a day, and one of her daughters stay with her each night so she has 24 hour care.  Patient has been weaned to RA.  Patient to discharge on Eliquis  Patient does not have medication coverage.  Contacted Patient's pharmacy and the out of cost expense is $399.  On goodrx.com the cheapest cost is $376 at [employer] notified daughter of cost.  Patient was provided with 30 day trail card.  Daughter is wanting to continue with the plan for Eliquis and is going to apply for assistance through the drug company.  I have called Medication Management to see if we could set the daughter with an appointment to get assistance completing the application.  Message left.  Patient's daughter provided with number to medication management to follow up.

## 2015-02-09 NOTE — Evaluation (Signed)
Physical Therapy Evaluation Patient Details Name: [name] MRN: [id number] DOB: 24-Apr-1924 Today's Date: 02/09/2015   History of Present Illness  Pt is an 82 y.o. female presenting to hospital with worsening mental status and admitted with UTI with encephalopathy and hypokalemia.  VQ scan showing B PE 02/06/15.  [place] B LE showing thrombus R lesser saphenous vein 02/07/15.  Pt with elevated troponin (per cardiology note, increased troponin and chest pain most likely d/t PE).  PMH includes dementia and IBS.  Clinical Impression  Prior to admission, pt required assist with all functional mobility (ambulating using railings/grab bars for support vs using RW vs getting pushed in manual w/c).  Pt lives alone but has 2 different day time sitters and daughter stays with pt at night so pt is never alone and has 24/7 assist with all functional mobility and ADL's for safety.  Currently pt is SBA supine to sit with HOB elevated and min to mod assist with transfers and short ambulation distance with RW.  Distance ambulated limited d/t SOB and fatigue (O2 sats 90% or greater on room air throughout treatment session).  Pt would benefit from skilled PT to address noted impairments and functional limitations.  Recommend pt discharge to home with 24/7 assist/supervision for all functional mobility and daily activities for safety (pt had this prior to admission) with HHPT when medically appropriate.  Anticipate pt will need to utilize manual w/c initially until pt's functional mobility and activity tolerance improves (pt's daughter educated on this).     Follow Up Recommendations Home health PT;Supervision/Assistance - 24 hour    Equipment Recommendations       Recommendations for Other Services       Precautions / Restrictions Precautions Precautions: Fall Restrictions Weight Bearing Restrictions: No      Mobility  Bed Mobility Overal bed mobility: Needs Assistance Bed Mobility: Supine to Sit      Supine to sit: Supervision;HOB elevated     General bed mobility comments: increased time to perform and vc's required to scoot forward on edge of bed  Transfers Overall transfer level: Needs assistance Equipment used: Rolling walker (2 wheeled) Transfers: Sit to/from Stand Sit to Stand: Min assist;Mod assist         General transfer comment: posterior lean upon standing; pt pushing LE's against back of bed and chair to stand; vc's required for hand and feet placement  Ambulation/Gait Ambulation/Gait assistance: Min assist;Mod assist Ambulation Distance (Feet): 20 Feet Assistive device: Rolling walker (2 wheeled)   Gait velocity: decreased   General Gait Details: pt with difficulty advancing R LE compared to L LE (pt's daughter reports pt has been shuffling her feet for past few weeks); mild increased R lean; vc's and assist required to stay closer to RW and for RW management; limited distance d/t SOB  Stairs            Wheelchair Mobility    Modified Rankin (Stroke Patients Only)       Balance Overall balance assessment: Needs assistance Sitting-balance support: Bilateral upper extremity supported;Feet supported Sitting balance-Leahy Scale: Fair     Standing balance support: Bilateral upper extremity supported Standing balance-Leahy Scale: Poor Standing balance comment: posterior lean upon standing                             Pertinent Vitals/Pain Pain Assessment: No/denies pain    Home Living Family/patient expects to be discharged to:: Private residence Living Arrangements: Alone  Available Help at Discharge: Family;Personal care attendant Type of Home: House Home Access: Ramped entrance     Home Layout: One level Home Equipment: Walker - 2 wheels;Shower seat - built in;Bedside commode;Wheelchair - manual      Prior Function Level of Independence: Needs assistance   Gait / Transfers Assistance Needed: Pt has 24/7 assist for functional  mobility (sometimes pt ambulates holding onto railings in hallways or using grab bars in home; other times pt uses RW; other times pt gets pushed in manual w/c depending on the day).  ADL's / Homemaking Assistance Needed: assist for ADL's  Comments: Pt has 2 different day time sitters and daughter stays with pt at night (24/7 assist); pt with fall off toilet Thanksgiving bruising face     Hand Dominance        Extremity/Trunk Assessment   Upper Extremity Assessment: Difficult to assess due to impaired cognition (at least 3+/5 with B shoulder flexion, elbow flexion/extension; good B hand grip)           Lower Extremity Assessment: Difficult to assess due to impaired cognition (at least 3+/5 B hip flexion, knee flexion/extension, and DF)         Communication   Communication: HOH  Cognition Arousal/Alertness: Awake/alert Behavior During Therapy: WFL for tasks assessed/performed Overall Cognitive Status: History of cognitive impairments - at baseline                      General Comments   Nursing cleared pt for participation in physical therapy.  Pt agreeable to PT session.    Exercises  Functional mobility training with pt and pt's daughter.      Assessment/Plan    PT Assessment Patient needs continued PT services  PT Diagnosis Difficulty walking;Generalized weakness   PT Problem List Decreased strength;Decreased activity tolerance;Decreased balance;Decreased mobility;Decreased knowledge of use of DME;Decreased knowledge of precautions  PT Treatment Interventions DME instruction;Gait training;Functional mobility training;Therapeutic activities;Therapeutic exercise;Balance training;Patient/family education   PT Goals (Current goals can be found in the Care Plan section) Acute Rehab PT Goals Patient Stated Goal: to go home PT Goal Formulation: With patient/family Time For Goal Achievement: 02/23/15 Potential to Achieve Goals: Good    Frequency Min 2X/week    Barriers to discharge        Co-evaluation               End of Session Equipment Utilized During Treatment: Gait belt Activity Tolerance: Patient limited by fatigue Patient left: in chair;with call bell/phone within reach;with chair alarm set;with nursing/sitter in room;with family/visitor present (NA present to assist pt with toileting) Nurse Communication: Mobility status;Precautions         Time: 1030-1104 PT Time Calculation (min) (ACUTE ONLY): 34 min   Charges:   PT Evaluation $PT Eval Low Complexity: 1 Procedure PT Treatments $Therapeutic Activity: 8-22 mins   PT G CodesHendricks Limes:        Elma Shands 02/09/2015, 11:28 AM Hendricks LimesEmily Ein Rijo, PT [phone]

## 2015-02-09 NOTE — Progress Notes (Signed)
Patient discharging home. Instructions and prescriptions given to patient and family, verbalized understanding. Daughter transporting patient home.

## 2015-02-09 NOTE — Discharge Instructions (Signed)
°  DIET:  °Regular diet ° °DISCHARGE CONDITION:  °Fair ° °ACTIVITY:  °Activity as tolerated ° °OXYGEN:  °Home Oxygen: No. °  °Oxygen Delivery: room air ° °DISCHARGE LOCATION:  °Home with home health  ° °If you experience worsening of your admission symptoms, develop shortness of breath, life threatening emergency, suicidal or homicidal thoughts you must seek medical attention immediately by calling 911 or calling your MD immediately  if symptoms less severe. ° °You Must read complete instructions/literature along with all the possible adverse reactions/side effects for all the Medicines you take and that have been prescribed to you. Take any new Medicines after you have completely understood and accpet all the possible adverse reactions/side effects.  ° °Please note ° °You were cared for by a hospitalist during your hospital stay. If you have any questions about your discharge medications or the care you received while you were in the hospital after you are discharged, you can call the unit and asked to speak with the hospitalist on call if the hospitalist that took care of you is not available. Once you are discharged, your primary care physician will handle any further medical issues. Please note that NO REFILLS for any discharge medications will be authorized once you are discharged, as it is imperative that you return to your primary care physician (or establish a relationship with a primary care physician if you do not have one) for your aftercare needs so that they can reassess your need for medications and monitor your lab values. ° °

## 2015-02-10 LAB — CULTURE, BLOOD (ROUTINE X 2)
Culture: NO GROWTH
Culture: NO GROWTH

## 2015-02-16 NOTE — Discharge Summary (Signed)
[facility] [place] at [facility]  DISCHARGE SUMMARY   PATIENT NAME: [name]    MR#:  [id number]  DATE OF BIRTH:  1925-01-30  DATE OF ADMISSION:  02/05/2015 ADMITTING PHYSICIAN: Wyatt Haste, MD  DATE OF DISCHARGE: 02/09/15  PRIMARY CARE PHYSICIAN: Rolm Gala, MD    ADMISSION DIAGNOSIS:  Hypokalemia [E87.6] UTI (lower urinary tract infection) [N39.0]  DISCHARGE DIAGNOSIS:  Principal Problem:   Hypokalemia Active Problems:   Malnutrition of moderate degree   Acute pulmonary embolism (HCC)   SECONDARY DIAGNOSIS:   Past Medical History  Diagnosis Date  . Vaginal bleeding   . Urethra, polyp   . Blood in stool   . Hematuria   . IBS (irritable bowel syndrome)   . Eczema   . Insomnia   . Osteopenia   . Anterolisthesis   . Dementia   . LVH (left ventricular hypertrophy) due to hypertensive disease     HOSPITAL COURSE:    82 year old female with past medical history significant for IBS, insomnia, osteopenia, early dementia and LVH and hypertension presents to the hospital secondary to change in mental status.  #1 altered mental status on presentation. Now resolved and back to baseline. Likely due to metabolic encephalopathy in the setting of UTI and PE with hypoxia.  #2 acute bilateral pulmonary embolism-nonocclusive clots noted in both lungs: Eliquis started, will need at least 12 months of treatment. Dopplers of lower extremities with right saphenous vein clot . Likely triggered by immobility.  #3 dyspnea-Much improved. Likely from PE and anxiety.  She does not qualify for or seem to need supplimental 02.  Will have [facility] to continue monitoring.  #4 UTI- cultures with Pseudomonas in the urine: Continue levaquin.  #5 depression and dementia-Stable on Paxil and also Namenda. Also on Seroquel  #6 Disposition: Family intent on taking the patient home. I think this is a good plan. She will have home health services and the  family is very motivated to participate in her care.  DISCHARGE CONDITIONS:   stable  CONSULTS OBTAINED:  Treatment Team:  Wyatt Haste, MD Dalia Heading, MD  DRUG ALLERGIES:   Allergies  Allergen Reactions  . Azithromycin Itching  . Bactrim [Sulfamethoxazole-Trimethoprim]   . Other Nausea And Vomiting  . Penicillins Swelling    Has patient had a PCN reaction causing immediate rash, facial/tongue/throat swelling, SOB or lightheadedness with hypotension: Yes Has patient had a PCN reaction causing severe rash involving mucus membranes or skin necrosis: No Has patient had a PCN reaction that required hospitalization No Has patient had a PCN reaction occurring within the last 10 years: Yes If all of the above answers are "NO", then may proceed with Cephalosporin use.  . Sulfa Antibiotics   . Vibramycin [Doxycycline Calcium]     DISCHARGE MEDICATIONS:   Discharge Medication List as of 02/09/2015  2:31 PM    START taking these medications   Details  !! apixaban (ELIQUIS) 5 MG TABS tablet Take 2 tablets (10 mg total) by mouth 2 (two) times daily., Starting 02/09/2015, Until Tue 02/14/15, Print    !! apixaban (ELIQUIS) 5 MG TABS tablet Take 1 tablet (5 mg total) by mouth 2 (two) times daily., Starting 02/14/2015, Until Discontinued, Normal    levofloxacin (LEVAQUIN) 250 MG tablet Take 1 tablet (250 mg total) by mouth daily., Starting 02/09/2015, Until Discontinued, Print    mometasone-formoterol (DULERA) 100-5 MCG/ACT AERO Inhale 2 puffs into the lungs 2 (two) times daily., Starting  02/09/2015, Until Discontinued, Print     !! - Potential duplicate medications found. Please discuss with provider.    CONTINUE these medications which have NOT CHANGED   Details  aspirin 81 MG chewable tablet Chew by mouth., Until Discontinued, Historical Med    cyanocobalamin (,VITAMIN B-12,) 1000 MCG/ML injection Inject 1,000 mcg into the muscle every 30 (thirty) days., Until Discontinued, Historical  Med    dicyclomine (BENTYL) 20 MG tablet Take 0.5 mg by mouth 3 (three) times daily as needed for spasms. , Until Discontinued, Historical Med    folic acid (FOLVITE) 1 MG tablet Take 1 mg by mouth daily. , Until Discontinued, Historical Med    Iron-Vitamins (GERITOL COMPLETE) TABS Take 1 tablet by mouth daily. , Until Discontinued, Historical Med    ketoconazole (NIZORAL) 2 % cream Apply 1 application topically daily. , Until Discontinued, Historical Med    Magnesium 250 MG TABS Take 1 tablet by mouth daily., Until Discontinued, Historical Med    memantine (NAMENDA) 10 MG tablet Take 5 mg by mouth 2 (two) times daily. , Starting 07/26/2014, Until Discontinued, Historical Med    PARoxetine (PAXIL) 10 MG tablet Take 10 mg by mouth daily. , Until Discontinued, Historical Med    QUEtiapine (SEROQUEL) 25 MG tablet Take 25 mg by mouth at bedtime. , Starting 07/12/2014, Until Fri 07/07/15, Historical Med      STOP taking these medications     nitrofurantoin, macrocrystal-monohydrate, (MACROBID) 100 MG capsule          DISCHARGE INSTRUCTIONS:    Heart healthy diet. Stable condition. DC to home with home health.  If you experience worsening of your admission symptoms, develop shortness of breath, life threatening emergency, suicidal or homicidal thoughts you must seek medical attention immediately by calling 911 or calling your MD immediately  if symptoms less severe.  You Must read complete instructions/literature along with all the possible adverse reactions/side effects for all the Medicines you take and that have been prescribed to you. Take any new Medicines after you have completely understood and accept all the possible adverse reactions/side effects.   Please note  You were cared for by a hospitalist during your hospital stay. If you have any questions about your discharge medications or the care you received while you were in the hospital after you are discharged, you can call the  unit and asked to speak with the hospitalist on call if the hospitalist that took care of you is not available. Once you are discharged, your primary care physician will handle any further medical issues. Please note that NO REFILLS for any discharge medications will be authorized once you are discharged, as it is imperative that you return to your primary care physician (or establish a relationship with a primary care physician if you do not have one) for your aftercare needs so that they can reassess your need for medications and monitor your lab values.    Today   CHIEF COMPLAINT:   Chief Complaint  Patient presents with  . Cough  . Fever  . Urinary Tract Infection    HISTORY OF PRESENT ILLNESS:  [name] is a 82 y.o. female with a known history of dementia, irritable bowel syndrome who is presenting with altered mental status. The patient is unable to provide meaningful information given mental status/medical history history obtained from daughter who is present at bedside. She states approximately one day duration of altered mental status described as confusion with associated weakness. Subjective fevers and chills  no further symptomatology  VITAL SIGNS:  Blood pressure 135/62, pulse 88, temperature 99.3 F (37.4 C), temperature source Oral, resp. rate 16, height 5\' 2"  (1.575 m), weight 59.784 kg (131 lb 12.8 oz), SpO2 90 %.  I/O:  No intake or output data in the 24 hours ending 02/16/15 1240  PHYSICAL EXAMINATION:  GENERAL:  82 y.o.-year-old patient sitting in chair with no acute distress.  EYES: Pupils equal, round, reactive to light and accommodation. No scleral icterus. Extraocular muscles intact.  HEENT: Head atraumatic, normocephalic. Oropharynx and nasopharynx clear.  NECK:  Supple, no jugular venous distention. No thyroid enlargement, no tenderness.  LUNGS: Normal breath sounds bilaterally, no wheezing, rales,rhonchi or crepitation. No use of accessory muscles of  respiration. No distress. CARDIOVASCULAR: S1, S2 normal. No murmurs, rubs, or gallops.  ABDOMEN: Soft, non-tender, non-distended. Bowel sounds present. No organomegaly or mass.  EXTREMITIES: No pedal edema, cyanosis, or clubbing.  NEUROLOGIC: Cranial nerves II through XII are intact. Muscle strength 5/5 in all extremities. Sensation intact. Gait not checked.  PSYCHIATRIC: The patient is alert and oriented x 3.  SKIN: No obvious rash, lesion, or ulcer.   DATA REVIEW:   CBC No results for input(s): WBC, HGB, HCT, PLT in the last 168 hours.  Chemistries  No results for input(s): NA, K, CL, CO2, GLUCOSE, BUN, CREATININE, CALCIUM, MG, AST, ALT, ALKPHOS, BILITOT in the last 168 hours.  Invalid input(s): GFRCGP  Cardiac Enzymes No results for input(s): TROPONINI in the last 168 hours.  Microbiology Results  Results for orders placed or performed during the hospital encounter of 02/05/15  Blood culture (routine x 2)     Status: None   Collection Time: 02/05/15  7:51 PM  Result Value Ref Range Status   Specimen Description BLOOD LEFT FACE  Final   Special Requests BOTTLES DRAWN AEROBIC AND ANAEROBIC 4CC  Final   Culture NO GROWTH 5 DAYS  Final   Report Status 02/10/2015 FINAL  Final  Blood culture (routine x 2)     Status: None   Collection Time: 02/05/15  7:51 PM  Result Value Ref Range Status   Specimen Description BLOOD RIGHT FACE  Final   Special Requests BOTTLES DRAWN AEROBIC AND ANAEROBIC 4CC  Final   Culture NO GROWTH 5 DAYS  Final   Report Status 02/10/2015 FINAL  Final  Urine culture     Status: None   Collection Time: 02/05/15  8:55 PM  Result Value Ref Range Status   Specimen Description URINE, RANDOM  Final   Special Requests NONE  Final   Culture >=100,000 COLONIES/mL PSEUDOMONAS AERUGINOSA  Final   Report Status 02/08/2015 FINAL  Final   Organism ID, Bacteria PSEUDOMONAS AERUGINOSA  Final      Susceptibility   Pseudomonas aeruginosa - MIC*    CEFTAZIDIME 4  SENSITIVE Sensitive     CIPROFLOXACIN 0.5 SENSITIVE Sensitive     GENTAMICIN 8 INTERMEDIATE Intermediate     IMIPENEM 2 SENSITIVE Sensitive     PIP/TAZO Value in next row Sensitive      SENSITIVE8    LEVOFLOXACIN Value in next row Sensitive      SENSITIVE1    * >=100,000 COLONIES/mL PSEUDOMONAS AERUGINOSA    RADIOLOGY:  No results found.  EKG:   Orders placed or performed during the hospital encounter of 02/05/15  . ED EKG  . ED EKG  . EKG 12-Lead  . EKG 12-Lead      Management plans discussed with the patient, family and they  are in agreement.  CODE STATUS:  Code Status History    Date Active Date Inactive Code Status Order ID Comments User Context   02/09/2015 11:52 AM 02/09/2015  6:06 PM DNR [id number]  Gale Journey, MD Inpatient   02/05/2015  8:28 PM 02/09/2015 11:52 AM Full Code [id number]  Wyatt Haste, MD ED    Questions for Most Recent Historical Code Status (Order [id number])    Question Answer Comment   In the event of cardiac or respiratory ARREST Do not call a "code blue"    In the event of cardiac or respiratory ARREST Do not perform Intubation, CPR, defibrillation or ACLS    In the event of cardiac or respiratory ARREST Use medication by any route, position, wound care, and other measures to relive pain and suffering. May use oxygen, suction and manual treatment of airway obstruction as needed for comfort.     Advance Directive Documentation        Most Recent Value   Type of Advance Directive  Healthcare Power of Attorney   Pre-existing out of facility DNR order (yellow form or pink MOST form)     "MOST" Form in Place?        TOTAL TIME TAKING CARE OF THIS PATIENT: 35 minutes.  Greater than 50% of time spent in care coordination and counseling. Care plan discussed with the patient, her daughter, and care management.  Elby Showers M.D on 02/16/2015 at 12:40 PM  Between 7am to 6pm - Pager - [phone]  After 6pm go to [email or web address] - password [email or web address]  [facility]  [facility]  Office  [phone]  CC: Primary care physician; Rolm Gala, MD

## 2015-03-15 DIAGNOSIS — F325 Major depressive disorder, single episode, in full remission: Secondary | ICD-10-CM | POA: Insufficient documentation

## 2015-07-21 ENCOUNTER — Emergency Department: Payer: Medicare Other

## 2015-07-21 ENCOUNTER — Encounter: Payer: Self-pay | Admitting: *Deleted

## 2015-07-21 ENCOUNTER — Inpatient Hospital Stay
Admission: EM | Admit: 2015-07-21 | Discharge: 2015-07-29 | DRG: 469 | Disposition: A | Discharge status: Skilled Nursing Facility | Payer: Medicare Other | Attending: Internal Medicine | Admitting: Internal Medicine

## 2015-07-21 DIAGNOSIS — M1990 Unspecified osteoarthritis, unspecified site: Secondary | ICD-10-CM | POA: Diagnosis present

## 2015-07-21 DIAGNOSIS — R52 Pain, unspecified: Secondary | ICD-10-CM

## 2015-07-21 DIAGNOSIS — Z7982 Long term (current) use of aspirin: Secondary | ICD-10-CM

## 2015-07-21 DIAGNOSIS — Z880 Allergy status to penicillin: Secondary | ICD-10-CM

## 2015-07-21 DIAGNOSIS — K580 Irritable bowel syndrome with diarrhea: Secondary | ICD-10-CM | POA: Diagnosis not present

## 2015-07-21 DIAGNOSIS — S72001A Fracture of unspecified part of neck of right femur, initial encounter for closed fracture: Secondary | ICD-10-CM

## 2015-07-21 DIAGNOSIS — R5082 Postprocedural fever: Secondary | ICD-10-CM | POA: Diagnosis not present

## 2015-07-21 DIAGNOSIS — M8580 Other specified disorders of bone density and structure, unspecified site: Secondary | ICD-10-CM | POA: Diagnosis present

## 2015-07-21 DIAGNOSIS — I119 Hypertensive heart disease without heart failure: Secondary | ICD-10-CM | POA: Diagnosis present

## 2015-07-21 DIAGNOSIS — A419 Sepsis, unspecified organism: Secondary | ICD-10-CM | POA: Diagnosis not present

## 2015-07-21 DIAGNOSIS — R509 Fever, unspecified: Secondary | ICD-10-CM

## 2015-07-21 DIAGNOSIS — Y92003 Bedroom of unspecified non-institutional (private) residence as the place of occurrence of the external cause: Secondary | ICD-10-CM

## 2015-07-21 DIAGNOSIS — Z7901 Long term (current) use of anticoagulants: Secondary | ICD-10-CM

## 2015-07-21 DIAGNOSIS — Z5309 Procedure and treatment not carried out because of other contraindication: Secondary | ICD-10-CM | POA: Diagnosis not present

## 2015-07-21 DIAGNOSIS — L8990 Pressure ulcer of unspecified site, unspecified stage: Secondary | ICD-10-CM | POA: Diagnosis not present

## 2015-07-21 DIAGNOSIS — R0902 Hypoxemia: Secondary | ICD-10-CM | POA: Diagnosis not present

## 2015-07-21 DIAGNOSIS — Z79899 Other long term (current) drug therapy: Secondary | ICD-10-CM

## 2015-07-21 DIAGNOSIS — E876 Hypokalemia: Secondary | ICD-10-CM | POA: Diagnosis not present

## 2015-07-21 DIAGNOSIS — J189 Pneumonia, unspecified organism: Secondary | ICD-10-CM | POA: Diagnosis not present

## 2015-07-21 DIAGNOSIS — S72011A Unspecified intracapsular fracture of right femur, initial encounter for closed fracture: Principal | ICD-10-CM | POA: Diagnosis present

## 2015-07-21 DIAGNOSIS — W19XXXA Unspecified fall, initial encounter: Secondary | ICD-10-CM

## 2015-07-21 DIAGNOSIS — W010XXA Fall on same level from slipping, tripping and stumbling without subsequent striking against object, initial encounter: Secondary | ICD-10-CM | POA: Diagnosis present

## 2015-07-21 DIAGNOSIS — F0390 Unspecified dementia without behavioral disturbance: Secondary | ICD-10-CM | POA: Diagnosis present

## 2015-07-21 DIAGNOSIS — Y95 Nosocomial condition: Secondary | ICD-10-CM | POA: Diagnosis not present

## 2015-07-21 DIAGNOSIS — Z86711 Personal history of pulmonary embolism: Secondary | ICD-10-CM

## 2015-07-21 DIAGNOSIS — I959 Hypotension, unspecified: Secondary | ICD-10-CM | POA: Diagnosis not present

## 2015-07-21 DIAGNOSIS — S72009A Fracture of unspecified part of neck of unspecified femur, initial encounter for closed fracture: Secondary | ICD-10-CM

## 2015-07-21 DIAGNOSIS — R6521 Severe sepsis with septic shock: Secondary | ICD-10-CM | POA: Diagnosis not present

## 2015-07-21 DIAGNOSIS — Z882 Allergy status to sulfonamides status: Secondary | ICD-10-CM

## 2015-07-21 HISTORY — DX: Essential (primary) hypertension: I10

## 2015-07-21 LAB — CBC
HEMATOCRIT: 38.4 % (ref 35.0–47.0)
Hemoglobin: 12.7 g/dL (ref 12.0–16.0)
MCH: 27.4 pg (ref 26.0–34.0)
MCHC: 33.1 g/dL (ref 32.0–36.0)
MCV: 82.7 fL (ref 80.0–100.0)
PLATELETS: 223 10*3/uL (ref 150–440)
RBC: 4.65 MIL/uL (ref 3.80–5.20)
RDW: 15.5 % — AB (ref 11.5–14.5)
WBC: 8.2 10*3/uL (ref 3.6–11.0)

## 2015-07-21 LAB — BASIC METABOLIC PANEL
Anion gap: 4 — ABNORMAL LOW (ref 5–15)
BUN: 14 mg/dL (ref 6–20)
CALCIUM: 9.2 mg/dL (ref 8.9–10.3)
CO2: 29 mmol/L (ref 22–32)
Chloride: 106 mmol/L (ref 101–111)
Creatinine, Ser: 0.95 mg/dL (ref 0.44–1.00)
GFR calc Af Amer: 60 mL/min — ABNORMAL LOW (ref >60)
GFR, EST NON AFRICAN AMERICAN: 52 mL/min — AB (ref >60)
GLUCOSE: 114 mg/dL — AB (ref 65–99)
Potassium: 3.5 mmol/L (ref 3.5–5.1)
Sodium: 139 mmol/L (ref 135–145)

## 2015-07-21 LAB — TROPONIN I: Troponin I: <0.03 ng/mL (ref <0.031)

## 2015-07-21 NOTE — ED Notes (Addendum)
Pt to ED from home after mechanical fall, c/c of right hip and right knee pain. Pt with slight shortening and external rotation noted along with swelling to the knee. Bilateral pedal pulses present, skin warm to touch. Pt given 75 mcg of fentanyl and 4 mg of zofran en route. Vitals stable at this time, NAD noted.

## 2015-07-21 NOTE — ED Notes (Signed)
Patient transported to CT/Xray at this time.

## 2015-07-21 NOTE — ED Notes (Signed)
Pt with necklace at this time, taken off for radiology purposes. Placed in urine cup and given to family members. Pt made aware and verbalized understanding

## 2015-07-22 DIAGNOSIS — Z882 Allergy status to sulfonamides status: Secondary | ICD-10-CM | POA: Diagnosis not present

## 2015-07-22 DIAGNOSIS — M8580 Other specified disorders of bone density and structure, unspecified site: Secondary | ICD-10-CM | POA: Diagnosis present

## 2015-07-22 DIAGNOSIS — Z5309 Procedure and treatment not carried out because of other contraindication: Secondary | ICD-10-CM | POA: Diagnosis not present

## 2015-07-22 DIAGNOSIS — Z7901 Long term (current) use of anticoagulants: Secondary | ICD-10-CM | POA: Diagnosis not present

## 2015-07-22 DIAGNOSIS — F0390 Unspecified dementia without behavioral disturbance: Secondary | ICD-10-CM | POA: Diagnosis present

## 2015-07-22 DIAGNOSIS — J189 Pneumonia, unspecified organism: Secondary | ICD-10-CM | POA: Diagnosis not present

## 2015-07-22 DIAGNOSIS — A419 Sepsis, unspecified organism: Secondary | ICD-10-CM | POA: Diagnosis not present

## 2015-07-22 DIAGNOSIS — L8990 Pressure ulcer of unspecified site, unspecified stage: Secondary | ICD-10-CM | POA: Diagnosis not present

## 2015-07-22 DIAGNOSIS — Z7982 Long term (current) use of aspirin: Secondary | ICD-10-CM | POA: Diagnosis not present

## 2015-07-22 DIAGNOSIS — R5082 Postprocedural fever: Secondary | ICD-10-CM | POA: Diagnosis not present

## 2015-07-22 DIAGNOSIS — M1990 Unspecified osteoarthritis, unspecified site: Secondary | ICD-10-CM | POA: Diagnosis present

## 2015-07-22 DIAGNOSIS — E876 Hypokalemia: Secondary | ICD-10-CM | POA: Diagnosis not present

## 2015-07-22 DIAGNOSIS — Y92003 Bedroom of unspecified non-institutional (private) residence as the place of occurrence of the external cause: Secondary | ICD-10-CM | POA: Diagnosis not present

## 2015-07-22 DIAGNOSIS — Y95 Nosocomial condition: Secondary | ICD-10-CM | POA: Diagnosis not present

## 2015-07-22 DIAGNOSIS — I959 Hypotension, unspecified: Secondary | ICD-10-CM | POA: Diagnosis not present

## 2015-07-22 DIAGNOSIS — S72001A Fracture of unspecified part of neck of right femur, initial encounter for closed fracture: Secondary | ICD-10-CM | POA: Diagnosis present

## 2015-07-22 DIAGNOSIS — Z86711 Personal history of pulmonary embolism: Secondary | ICD-10-CM | POA: Diagnosis not present

## 2015-07-22 DIAGNOSIS — Z79899 Other long term (current) drug therapy: Secondary | ICD-10-CM | POA: Diagnosis not present

## 2015-07-22 DIAGNOSIS — S72011A Unspecified intracapsular fracture of right femur, initial encounter for closed fracture: Secondary | ICD-10-CM | POA: Diagnosis present

## 2015-07-22 DIAGNOSIS — I119 Hypertensive heart disease without heart failure: Secondary | ICD-10-CM | POA: Diagnosis present

## 2015-07-22 DIAGNOSIS — K580 Irritable bowel syndrome with diarrhea: Secondary | ICD-10-CM | POA: Diagnosis not present

## 2015-07-22 DIAGNOSIS — R0902 Hypoxemia: Secondary | ICD-10-CM | POA: Diagnosis not present

## 2015-07-22 DIAGNOSIS — S72009A Fracture of unspecified part of neck of unspecified femur, initial encounter for closed fracture: Secondary | ICD-10-CM | POA: Diagnosis present

## 2015-07-22 DIAGNOSIS — R6521 Severe sepsis with septic shock: Secondary | ICD-10-CM | POA: Diagnosis not present

## 2015-07-22 DIAGNOSIS — W010XXA Fall on same level from slipping, tripping and stumbling without subsequent striking against object, initial encounter: Secondary | ICD-10-CM | POA: Diagnosis present

## 2015-07-22 DIAGNOSIS — Z880 Allergy status to penicillin: Secondary | ICD-10-CM | POA: Diagnosis not present

## 2015-07-22 LAB — TYPE AND SCREEN
ABO/RH(D): AB POS
ANTIBODY SCREEN: NEGATIVE

## 2015-07-22 LAB — CBC
HCT: 40.3 % (ref 35.0–47.0)
Hemoglobin: 13.1 g/dL (ref 12.0–16.0)
MCH: 26.9 pg (ref 26.0–34.0)
MCHC: 32.6 g/dL (ref 32.0–36.0)
MCV: 82.6 fL (ref 80.0–100.0)
Platelets: 218 10*3/uL (ref 150–440)
RBC: 4.88 MIL/uL (ref 3.80–5.20)
RDW: 15.6 % — AB (ref 11.5–14.5)
WBC: 13.6 10*3/uL — AB (ref 3.6–11.0)

## 2015-07-22 LAB — SURGICAL PCR SCREEN
MRSA, PCR: NEGATIVE
Staphylococcus aureus: POSITIVE — AB

## 2015-07-22 LAB — PROTIME-INR
INR: 1.12
PROTHROMBIN TIME: 14.6 s (ref 11.4–15.0)

## 2015-07-22 LAB — APTT: APTT: 31 s (ref 24–36)

## 2015-07-22 MED ORDER — KETOCONAZOLE 2 % EX CREA
1.0000 "application " | TOPICAL_CREAM | Freq: Every day | CUTANEOUS | Status: DC
  Administered 2015-07-22 – 2015-07-28 (×2): 1 via TOPICAL
  Filled 2015-07-22: qty 15

## 2015-07-22 MED ORDER — HYDROCODONE-ACETAMINOPHEN 5-325 MG PO TABS
1.0000 | ORAL_TABLET | ORAL | Status: DC | PRN
Start: 2015-07-22 — End: 2015-07-22

## 2015-07-22 MED ORDER — ADULT MULTIVITAMIN W/MINERALS CH
1.0000 | ORAL_TABLET | Freq: Every day | ORAL | Status: DC
  Administered 2015-07-22 – 2015-07-28 (×4): 1 via ORAL
  Filled 2015-07-22 (×5): qty 1

## 2015-07-22 MED ORDER — SENNOSIDES-DOCUSATE SODIUM 8.6-50 MG PO TABS: 1.0000 | ORAL_TABLET | Freq: Every evening | ORAL | Status: DC | PRN

## 2015-07-22 MED ORDER — MORPHINE SULFATE (PF) 2 MG/ML IV SOLN: 0.5000 mg | INTRAVENOUS | Status: DC | PRN

## 2015-07-22 MED ORDER — QUETIAPINE FUMARATE 25 MG PO TABS
25.0000 mg | ORAL_TABLET | Freq: Every day | ORAL | Status: DC
  Administered 2015-07-22 – 2015-07-28 (×7): 25 mg via ORAL
  Filled 2015-07-22 (×8): qty 1

## 2015-07-22 MED ORDER — DICYCLOMINE HCL 20 MG PO TABS
10.0000 mg | ORAL_TABLET | Freq: Three times a day (TID) | ORAL | Status: DC | PRN
  Administered 2015-07-23: 10 mg via ORAL
  Filled 2015-07-22 (×2): qty 1

## 2015-07-22 MED ORDER — MORPHINE SULFATE (PF) 2 MG/ML IV SOLN
2.0000 mg | Freq: Once | INTRAVENOUS | Status: AC
  Administered 2015-07-22: 2 mg via INTRAVENOUS
  Filled 2015-07-22: qty 1

## 2015-07-22 MED ORDER — MORPHINE SULFATE (PF) 2 MG/ML IV SOLN
2.0000 mg | INTRAVENOUS | Status: DC | PRN
  Administered 2015-07-22 – 2015-07-24 (×4): 2 mg via INTRAVENOUS
  Filled 2015-07-22 (×5): qty 1

## 2015-07-22 MED ORDER — MAGNESIUM OXIDE 400 (241.3 MG) MG PO TABS
200.0000 mg | ORAL_TABLET | Freq: Every day | ORAL | Status: DC
  Administered 2015-07-22 – 2015-07-23 (×2): 200 mg via ORAL
  Filled 2015-07-22: qty 1
  Filled 2015-07-22: qty 2

## 2015-07-22 MED ORDER — MEMANTINE HCL 10 MG PO TABS
5.0000 mg | ORAL_TABLET | Freq: Two times a day (BID) | ORAL | Status: DC
  Administered 2015-07-22 – 2015-07-29 (×11): 5 mg via ORAL
  Filled 2015-07-22 (×13): qty 1

## 2015-07-22 MED ORDER — CLINDAMYCIN PHOSPHATE 600 MG/50ML IV SOLN: 600.0000 mg | Freq: Once | INTRAVENOUS | Status: DC

## 2015-07-22 MED ORDER — ENOXAPARIN SODIUM 30 MG/0.3ML ~~LOC~~ SOLN
30.0000 mg | Freq: Two times a day (BID) | SUBCUTANEOUS | Status: DC
  Administered 2015-07-22 – 2015-07-23 (×2): 30 mg via SUBCUTANEOUS
  Filled 2015-07-22 (×2): qty 0.3

## 2015-07-22 MED ORDER — SODIUM CHLORIDE 0.9 % IV SOLN
INTRAVENOUS | Status: AC
  Administered 2015-07-23: 75 mL/h via INTRAVENOUS
  Administered 2015-07-23 – 2015-07-25 (×3): via INTRAVENOUS

## 2015-07-22 MED ORDER — CLINDAMYCIN PHOSPHATE 600 MG/50ML IV SOLN
600.0000 mg | Freq: Once | INTRAVENOUS | Status: AC
  Administered 2015-07-26: 600 mg via INTRAVENOUS
  Filled 2015-07-22: qty 50

## 2015-07-22 MED ORDER — LIFITEGRAST 5 % OP SOLN
1.0000 [drp] | Freq: Two times a day (BID) | OPHTHALMIC | Status: DC
  Administered 2015-07-22 – 2015-07-28 (×9): 1 [drp] via OPHTHALMIC
  Filled 2015-07-22 (×5): qty 1

## 2015-07-22 MED ORDER — HYDROCODONE-ACETAMINOPHEN 5-325 MG PO TABS
1.0000 | ORAL_TABLET | Freq: Four times a day (QID) | ORAL | Status: DC | PRN
  Administered 2015-07-23 – 2015-07-24 (×3): 1 via ORAL
  Administered 2015-07-25 – 2015-07-26 (×3): 2 via ORAL
  Filled 2015-07-22 (×2): qty 2
  Filled 2015-07-22 (×3): qty 1
  Filled 2015-07-22: qty 2

## 2015-07-22 MED ORDER — METHOCARBAMOL 1000 MG/10ML IJ SOLN
500.0000 mg | Freq: Three times a day (TID) | INTRAVENOUS | Status: DC | PRN
  Administered 2015-07-22: 500 mg via INTRAVENOUS
  Filled 2015-07-22 (×4): qty 5

## 2015-07-22 MED ORDER — PAROXETINE HCL 20 MG PO TABS
10.0000 mg | ORAL_TABLET | Freq: Every day | ORAL | Status: DC
  Administered 2015-07-22 – 2015-07-29 (×6): 10 mg via ORAL
  Filled 2015-07-22: qty 1
  Filled 2015-07-22: qty 0.5
  Filled 2015-07-22: qty 1
  Filled 2015-07-22: qty 2
  Filled 2015-07-22 (×4): qty 1

## 2015-07-22 MED ORDER — FOLIC ACID 1 MG PO TABS
1.0000 mg | ORAL_TABLET | Freq: Every day | ORAL | Status: DC
  Administered 2015-07-22 – 2015-07-29 (×5): 1 mg via ORAL
  Filled 2015-07-22 (×5): qty 1

## 2015-07-22 MED ORDER — HYDRALAZINE HCL 20 MG/ML IJ SOLN
10.0000 mg | INTRAMUSCULAR | Status: DC | PRN
  Administered 2015-07-22: 10 mg via INTRAVENOUS
  Filled 2015-07-22: qty 1

## 2015-07-22 MED ORDER — ONDANSETRON HCL 4 MG/2ML IJ SOLN: 4.0000 mg | Freq: Four times a day (QID) | INTRAMUSCULAR | Status: DC | PRN

## 2015-07-22 MED ORDER — OXYCODONE HCL 5 MG PO TABS
5.0000 mg | ORAL_TABLET | ORAL | Status: DC | PRN
  Administered 2015-07-22 (×2): 5 mg via ORAL
  Filled 2015-07-22 (×2): qty 1

## 2015-07-22 MED ORDER — ACETAMINOPHEN 650 MG RE SUPP: 650.0000 mg | Freq: Four times a day (QID) | RECTAL | Status: DC | PRN

## 2015-07-22 MED ORDER — DICYCLOMINE HCL 20 MG PO TABS: 0.5000 mg | ORAL_TABLET | Freq: Three times a day (TID) | ORAL | Status: DC | PRN

## 2015-07-22 MED ORDER — GERITOL COMPLETE PO TABS: 1.0000 | ORAL_TABLET | Freq: Every day | ORAL | Status: DC

## 2015-07-22 MED ORDER — MORPHINE SULFATE (PF) 2 MG/ML IV SOLN
2.0000 mg | INTRAVENOUS | Status: DC | PRN
  Administered 2015-07-22: 2 mg via INTRAVENOUS
  Filled 2015-07-22: qty 1

## 2015-07-22 MED ORDER — MAGNESIUM 250 MG PO TABS: 1.0000 | ORAL_TABLET | Freq: Every day | ORAL | Status: DC

## 2015-07-22 MED ORDER — ACETAMINOPHEN 325 MG PO TABS
650.0000 mg | ORAL_TABLET | Freq: Four times a day (QID) | ORAL | Status: DC | PRN
  Administered 2015-07-23 – 2015-07-24 (×2): 650 mg via ORAL
  Filled 2015-07-22 (×3): qty 2

## 2015-07-22 MED ORDER — ONDANSETRON HCL 4 MG PO TABS: 4.0000 mg | ORAL_TABLET | Freq: Four times a day (QID) | ORAL | Status: DC | PRN

## 2015-07-22 NOTE — Consult Note (Signed)
ORTHOPAEDIC CONSULTATION  REQUESTING PHYSICIAN: Ramonita Lab, MD  Chief Complaint: Right femoral neck hip fracture  HPI: [name] is a 82 y.o. female who complains of  hip pain status post fall. Patient was at home getting ready for bed. She was found by her daughter shortly after a fall. Patient was unable to stand or ambulate. She is brought to the [facility] emergency Department where she was diagnosed by x-ray with a displaced right femoral neck hip fracture. Patient denies loss of consciousness or other injuries.    Past Medical History  Diagnosis Date  . Vaginal bleeding   . Urethra, polyp   . Blood in stool   . Hematuria   . IBS (irritable bowel syndrome)   . Eczema   . Insomnia   . Osteopenia   . Anterolisthesis   . Dementia   . LVH (left ventricular hypertrophy) due to hypertensive disease   . Hypertension    History reviewed. No pertinent past surgical history. Social History   Social History  . Marital Status: Widowed    Spouse Name: N/A  . Number of Children: N/A  . Years of Education: N/A   Social History Main Topics  . Smoking status: Never Smoker   . Smokeless tobacco: None  . Alcohol Use: No  . Drug Use: No  . Sexual Activity: No   Other Topics Concern  . None   Social History Narrative   History reviewed. No pertinent family history. Allergies  Allergen Reactions  . Azithromycin Itching  . Bactrim [Sulfamethoxazole-Trimethoprim]   . Other Nausea And Vomiting  . Penicillins Swelling    Has patient had a PCN reaction causing immediate rash, facial/tongue/throat swelling, SOB or lightheadedness with hypotension: Yes Has patient had a PCN reaction causing severe rash involving mucus membranes or skin necrosis: No Has patient had a PCN reaction that required hospitalization No Has patient had a PCN reaction occurring within the last 10 years: Yes If all of the above answers are "NO", then may proceed with Cephalosporin use.  . Sulfa  Antibiotics   . Vibramycin [Doxycycline Calcium] Other (See Comments)    Reaction : Unknown   Prior to Admission medications   Medication Sig Start Date End Date Taking? Authorizing Provider  apixaban (ELIQUIS) 5 MG TABS tablet Take 1 tablet (5 mg total) by mouth 2 (two) times daily. 02/14/15  Yes Gale Journey, MD  aspirin 81 MG chewable tablet Chew by mouth.   Yes Historical Provider, MD  cyanocobalamin (,VITAMIN B-12,) 1000 MCG/ML injection Inject 1,000 mcg into the muscle every 30 (thirty) days.   Yes Historical Provider, MD  dicyclomine (BENTYL) 20 MG tablet Take 0.5 mg by mouth 3 (three) times daily as needed for spasms.    Yes Historical Provider, MD  folic acid (FOLVITE) 1 MG tablet Take 1 mg by mouth daily.    Yes Historical Provider, MD  Iron-Vitamins (GERITOL COMPLETE) TABS Take 1 tablet by mouth daily.    Yes Historical Provider, MD  ketoconazole (NIZORAL) 2 % cream Apply 1 application topically daily.    Yes Historical Provider, MD  Lifitegrast Benay Spice) 5 % SOLN Place 1 drop into both eyes 2 (two) times daily.   Yes Historical Provider, MD  Magnesium 250 MG TABS Take 1 tablet by mouth daily.   Yes Historical Provider, MD  memantine (NAMENDA) 10 MG tablet Take 5 mg by mouth 2 (two) times daily.  07/26/14  Yes Historical Provider, MD  PARoxetine (PAXIL) 10 MG tablet  Take 10 mg by mouth daily.    Yes Historical Provider, MD  QUEtiapine (SEROQUEL) 25 MG tablet Take 25 mg by mouth at bedtime.   Yes Historical Provider, MD  apixaban (ELIQUIS) 5 MG TABS tablet Take 2 tablets (10 mg total) by mouth 2 (two) times daily. 02/09/15 02/14/15  Gale Journey, MD  levofloxacin (LEVAQUIN) 250 MG tablet Take 1 tablet (250 mg total) by mouth daily. Patient not taking: Reported on 07/22/2015 02/09/15   Gale Journey, MD  mometasone-formoterol [facility]) 100-5 MCG/ACT AERO Inhale 2 puffs into the lungs 2 (two) times daily. Patient not taking: Reported on 07/22/2015 02/09/15   Gale Journey, MD   QUEtiapine (SEROQUEL) 25 MG tablet Take 25 mg by mouth at bedtime.  07/12/14 07/07/15  Historical Provider, MD   Dg Chest 1 View  07/22/2015  CLINICAL DATA:  Preoperative chest radiograph. EXAM: CHEST 1 VIEW COMPARISON:  None. FINDINGS: The heart size and mediastinal contours are within normal limits. Both lungs are clear. The visualized skeletal structures are unremarkable. IMPRESSION: No active disease. Electronically Signed   By: Ted Mcalpine M.D.   On: 07/22/2015 00:00   Dg Knee 1-2 Views Right  07/22/2015  CLINICAL DATA:  Right hip and right knee pain after a fall. EXAM: RIGHT KNEE - 1-2 VIEW COMPARISON:  None. FINDINGS: Degenerative changes in the right knee with medial greater than lateral compartment narrowing and tricompartment osteophyte formation. There is a small right knee effusion without hemarthrosis. No evidence of acute fracture or dislocation. No focal bone lesion or bone destruction. Vascular calcifications. IMPRESSION: Moderate tricompartment degenerative changes in the right knee. Small right knee effusion. No acute displaced fractures identified. Electronically Signed   By: Burman Nieves M.D.   On: 07/22/2015 00:01   Ct Head Wo Contrast  07/22/2015  CLINICAL DATA:  Status post fall. EXAM: CT HEAD WITHOUT CONTRAST TECHNIQUE: Contiguous axial images were obtained from the base of the skull through the vertex without intravenous contrast. COMPARISON:  01/15/2012 FINDINGS: Brain: No evidence of acute infarction, hemorrhage, extra-axial collection, ventriculomegaly, or mass effect. There is moderate brain parenchymal volume loss. Extensive microvascular ischemic changes are also seen. Vascular: No hyperdense vessel or unexpected calcification. Skull: Negative for fracture or focal lesion. Sinuses/Orbits: Mild opacification of the left sphenoid sinus, otherwise no acute findings. Other: None. IMPRESSION: No acute intracranial abnormality. Atrophy, chronic microvascular disease.  Electronically Signed   By: Ted Mcalpine M.D.   On: 07/22/2015 00:17   Dg Hip Unilat With Pelvis 2-3 Views Right  07/22/2015  CLINICAL DATA:  Mechanical fall. Right hip and right knee pain. Shortening and external rotation. EXAM: DG HIP (WITH OR WITHOUT PELVIS) 2-3V RIGHT COMPARISON:  None. FINDINGS: Acute transverse fracture of the right subcapital femoral neck with superior subluxation of the distal fracture fragment resulting in varus angulation of the fracture fragments. The pelvis appears intact. SI joints and symphysis pubis are not displaced. Mild degenerative changes in the lower lumbar spine and hips. Soft tissue calcifications likely represent injection granulomas. IMPRESSION: Acute transverse fracture of the subcapital right femoral neck with varus angulation. Electronically Signed   By: Burman Nieves M.D.   On: 07/22/2015 00:00    Positive ROS: All other systems have been reviewed and were otherwise negative with the exception of those mentioned in the HPI and as above.  Physical Exam: General: Alert, no acute distress   MUSCULOSKELETAL: Right lower extremity: Patient's skin is intact. There is no erythema or ecchymosis or swelling. Her  thigh and leg compartments are soft and compressible. She has intact sensation to light touch throughout the bilateral lower extremity with palpable pedal pulses. She has intact motor function and can flex and extend her toes and dorsiflex and plantarflex her ankles bilaterally.  Assessment: Right displaced femoral neck hip fracture  Plan: Patient will require a right hip hemiarthroplasty. She is on Eliquis and received her last dose on Friday night.   Patient has to be off of Eliquis for 48 hours before surgery.  I anticipate her surgery will be Monday. Patient has been given a regular diet. She will be placed in Buck's traction to avoid right lower extremity shortening. I will discuss with anesthesia short acting anticoagulation in the  meantime. I discussed the details of the operation as well as the postoperative course with the patient and her family. Her daughters are at the bedside this morning.  I have reviewed the risks and benefits of surgery with the patient and her family.  They understand the risks of surgery include but are not limited to infection requiring removal of the prosthesis, bleeding requiring blood transfusion, nerve injury especially to the sciatic nerve leading to foot drop or lower extremity numbness, periprosthetic fracture, dislocation leg length discrepancy, change in lower extremity rotation persistent hip pain, loosening or failure of the components and the need for revision surgery. Medical risks include but are not limited to DVT and pulmonary embolism, myocardial infarction, stroke, pneumonia, respiratory failure and death. I reviewed all the patient's labs and radiographic studies in preparation for this case. Patient has been cleared for surgery by the hospitalist service.    [name], Colleen Ambrocio, MD    07/22/2015 8:38 AM

## 2015-07-22 NOTE — Progress Notes (Signed)
[facility] - [place] at [facility]   PATIENT NAME: [name]    MR#:  [id number]  DATE OF BIRTH:  [birth year]  SUBJECTIVE:  CHIEF COMPLAINT: Patient is lethargic from the pain medicine. Family at bedside.  REVIEW OF SYSTEMS:  Review of systems unobtainable as the patient is lethargic from the pain medicine  DRUG ALLERGIES:   Allergies  Allergen Reactions  . Azithromycin Itching  . Bactrim [Sulfamethoxazole-Trimethoprim]   . Other Nausea And Vomiting  . Penicillins Swelling    Has patient had a PCN reaction causing immediate rash, facial/tongue/throat swelling, SOB or lightheadedness with hypotension: Yes Has patient had a PCN reaction causing severe rash involving mucus membranes or skin necrosis: No Has patient had a PCN reaction that required hospitalization No Has patient had a PCN reaction occurring within the last 10 years: Yes If all of the above answers are "NO", then may proceed with Cephalosporin use.  . Sulfa Antibiotics   . Vibramycin [Doxycycline Calcium] Other (See Comments)    Reaction : Unknown    VITALS:  Blood pressure 127/63, pulse 88, temperature 100.6 F (38.1 C), temperature source Axillary, resp. rate 16, height 5' (1.524 m), weight 58.968 kg (130 lb), SpO2 94 %.  PHYSICAL EXAMINATION:  GENERAL:  82 y.o.-year-old patient lying in the bed with no acute distress.  EYES: Pupils equal, round, reactive to light and accommodation. No scleral icterus.  HEENT: Head atraumatic, normocephalic. Oropharynx and nasopharynx clear.  NECK:  Supple, no jugular venous distention. No thyroid enlargement, no tenderness.  LUNGS: Normal breath sounds bilaterally, no wheezing, rales,rhonchi or crepitation. No use of accessory muscles of respiration.  CARDIOVASCULAR: S1, S2 normal. No murmurs, rubs, or gallops.  ABDOMEN: Soft, nontender, nondistended. Bowel sounds present. No organomegaly or mass.  EXTREMITIES: Right hip is abducted, and under  traction No pedal edema, cyanosis, or clubbing.  NEUROLOGIC: Patient is lethargic but arousable.  PSYCHIATRIC: The patient is lethargic but arousable and falling asleep SKIN: No obvious rash, lesion, or ulcer.    LABORATORY PANEL:   CBC  Recent Labs Lab 07/22/15 0414  WBC 13.6*  HGB 13.1  HCT 40.3  PLT 218   ------------------------------------------------------------------------------------------------------------------  Chemistries   Recent Labs Lab 07/21/15 2318  NA 139  K 3.5  CL 106  CO2 29  GLUCOSE 114*  BUN 14  CREATININE 0.95  CALCIUM 9.2   ------------------------------------------------------------------------------------------------------------------  Cardiac Enzymes  Recent Labs Lab 07/21/15 2318  TROPONINI <0.03   ------------------------------------------------------------------------------------------------------------------  RADIOLOGY:  Dg Chest 1 View  07/22/2015  CLINICAL DATA:  Preoperative chest radiograph. EXAM: CHEST 1 VIEW COMPARISON:  None. FINDINGS: The heart size and mediastinal contours are within normal limits. Both lungs are clear. The visualized skeletal structures are unremarkable. IMPRESSION: No active disease. Electronically Signed   By: Ted Mcalpine M.D.   On: 07/22/2015 00:00   Dg Knee 1-2 Views Right  07/22/2015  CLINICAL DATA:  Right hip and right knee pain after a fall. EXAM: RIGHT KNEE - 1-2 VIEW COMPARISON:  None. FINDINGS: Degenerative changes in the right knee with medial greater than lateral compartment narrowing and tricompartment osteophyte formation. There is a small right knee effusion without hemarthrosis. No evidence of acute fracture or dislocation. No focal bone lesion or bone destruction. Vascular calcifications. IMPRESSION: Moderate tricompartment degenerative changes in the right knee. Small right knee effusion. No acute displaced fractures identified. Electronically Signed   By: Burman Nieves M.D.   On:  07/22/2015 00:01   Ct Head Wo  Contrast  07/22/2015  CLINICAL DATA:  Status post fall. EXAM: CT HEAD WITHOUT CONTRAST TECHNIQUE: Contiguous axial images were obtained from the base of the skull through the vertex without intravenous contrast. COMPARISON:  01/15/2012 FINDINGS: Brain: No evidence of acute infarction, hemorrhage, extra-axial collection, ventriculomegaly, or mass effect. There is moderate brain parenchymal volume loss. Extensive microvascular ischemic changes are also seen. Vascular: No hyperdense vessel or unexpected calcification. Skull: Negative for fracture or focal lesion. Sinuses/Orbits: Mild opacification of the left sphenoid sinus, otherwise no acute findings. Other: None. IMPRESSION: No acute intracranial abnormality. Atrophy, chronic microvascular disease. Electronically Signed   By: Ted Mcalpineobrinka  Dimitrova M.D.   On: 07/22/2015 00:17   Dg Hip Unilat With Pelvis 2-3 Views Right  07/22/2015  CLINICAL DATA:  Mechanical fall. Right hip and right knee pain. Shortening and external rotation. EXAM: DG HIP (WITH OR WITHOUT PELVIS) 2-3V RIGHT COMPARISON:  None. FINDINGS: Acute transverse fracture of the right subcapital femoral neck with superior subluxation of the distal fracture fragment resulting in varus angulation of the fracture fragments. The pelvis appears intact. SI joints and symphysis pubis are not displaced. Mild degenerative changes in the lower lumbar spine and hips. Soft tissue calcifications likely represent injection granulomas. IMPRESSION: Acute transverse fracture of the subcapital right femoral neck with varus angulation. Electronically Signed   By: Burman NievesWilliam  Stevens M.D.   On: 07/22/2015 00:00    EKG:   Orders placed or performed during the hospital encounter of 07/21/15  . EKG 12-Lead  . EKG 12-Lead  . ED EKG  . ED EKG    ASSESSMENT AND PLAN:   82 female admitted for a right hip fracture.  1. Right hip fracture.  Patient was previously diagnosed with PE in  December 2016 and has been Eliquis 5 Mg,which is currently on hold,  Ortho would wait 48 hours before proceeding with surgery.  . EKG shows a normal sinus rhythm.  Dr. Martha ClanKrasinski is considering surgery on Monday Continue pain management as needed  2. Dementia. Continue Namenda 10 mg daily, paroxetine 10 mg daily and Seroquel 25 mg daily at bedtime   3. History of PE. Patient diagnosed in December 2016, has been on Eliquis past 6 months,Eliquis currently on hold. No prior DVT/PE.   4. DVT prophylaxis. SCDs at this time.     All the records are reviewed and case discussed with Care Management/Social Workerr. Management plans discussed with the patient, family and they are in agreement.  CODE STATUS: FC  TOTAL TIME TAKING CARE OF THIS PATIENT: 35minutes.   POSSIBLE D/C IN 4 DAYS, DEPENDING ON CLINICAL CONDITION.  Note: This dictation was prepared with Dragon dictation along with smaller phrase technology. Any transcriptional errors that result from this process are unintentional.   Ramonita LabGouru, Quindell Shere M.D on 07/22/2015 at 12:57 PM  Between 7am to 6pm - Pager - [phone] After 6pm go to [email or web address] - password [email or web address] [facility]  [facility]  Office  [phone]  CC: Primary care physician; Rolm GalaGRANDIS, HEIDI, MD

## 2015-07-22 NOTE — ED Notes (Signed)
MD at bedside. 

## 2015-07-22 NOTE — Progress Notes (Signed)
PT Cancellation Note  Patient Details Name: [name] MRN: [id number] DOB: 06/24/1924   Cancelled Treatment:    Reason Eval/Treat Not Completed: Medical issues which prohibited therapy (Awaiting hip surgery).  Will check for new orders when surgery completed.   Ivar DrapeStout, Kimala Horne E 07/22/2015, 9:43 AM    Samul Dadauth Jaid Quirion, PT MS Acute Rehab Dept. Number: [facility] [phone] and [facility] [phone]

## 2015-07-22 NOTE — H&P (Addendum)
[facility] - [place] at [facility]   PATIENT NAME: [name]    MR#:  [id number]  DATE OF BIRTH:  [birth year]  DATE OF ADMISSION:  07/21/2015  PRIMARY CARE PHYSICIAN: Rolm Gala, MD   REQUESTING/REFERRING PHYSICIAN: Your physician, Dr. Zenda Alpers  CHIEF COMPLAINT:   Chief Complaint  Patient presents with  . Fall  . Hip Pain    HISTORY OF PRESENT ILLNESS:  [name]  is a 82 y.o. female with a known history of Prior pulmonary embolus in December 2016 was since been on liquid twice a day, dementia, osteopenia and does some underlying arthritis was at home his evening she was getting ready to go to bed.  Patient was walking into her bedroom she tripped over her rope fell and landed on the right hip. Afterwards patient was unable to ambulate and was complaining of severe pain. Prior to the fall the family denied any recent complaint of lightheadedness or dizziness, patient was not complaining of any chest pain or palpitations. Sounds like it was simply a mechanical fall. For that reason EMS was called and she was brought into the hospital. In route patient was given fentanyl.  In emergency room patient was afebrile, blood pressure was as high as 190/90. She is saturating 100% on room air. CBC and metabolic panel were both benign, troponin was less than 0.03. She underwent a CT of her head which showed no acute intracranial abnormality, chest x-ray without vascular congestion or consolidation. Had a hip x-ray which showed an acute transverse fracture of the subcapital right femoral neck fracture. EKG showed normal sinus rhythm. Patient had a right knee x-ray which showed some effusion but no evidence of any fracture or dislocation. Patient was given morphine for pain and then hospitalist called to admit. No prior history of CVA, CHF, CAD, CK D. Patient does not have diabetes.   PAST MEDICAL HISTORY:   Past Medical History  Diagnosis Date  . Vaginal bleeding   .  Urethra, polyp   . Blood in stool   . Hematuria   . IBS (irritable bowel syndrome)   . Eczema   . Insomnia   . Osteopenia   . Anterolisthesis   . Dementia   . LVH (left ventricular hypertrophy) due to hypertensive disease   . Hypertension     PAST SURGICAL HISTORY:  History reviewed. No pertinent past surgical history.  SOCIAL HISTORY:   Social History  Substance Use Topics  . Smoking status: Never Smoker   . Smokeless tobacco: Not on file  . Alcohol Use: No    FAMILY HISTORY:  History reviewed. No pertinent family history.  DRUG ALLERGIES:   Allergies  Allergen Reactions  . Azithromycin Itching  . Bactrim [Sulfamethoxazole-Trimethoprim]   . Other Nausea And Vomiting  . Penicillins Swelling    Has patient had a PCN reaction causing immediate rash, facial/tongue/throat swelling, SOB or lightheadedness with hypotension: Yes Has patient had a PCN reaction causing severe rash involving mucus membranes or skin necrosis: No Has patient had a PCN reaction that required hospitalization No Has patient had a PCN reaction occurring within the last 10 years: Yes If all of the above answers are "NO", then may proceed with Cephalosporin use.  . Sulfa Antibiotics   . Vibramycin [Doxycycline Calcium] Other (See Comments)    Reaction : Unknown    REVIEW OF SYSTEMS:   ROS. 12 point review of systems negative except as above in history of present illness   MEDICATIONS  AT HOME:   Prior to Admission medications   Medication Sig Start Date End Date Taking? Authorizing Provider  apixaban (ELIQUIS) 5 MG TABS tablet Take 1 tablet (5 mg total) by mouth 2 (two) times daily. 02/14/15  Yes Gale Journeyatherine P Walsh, MD  aspirin 81 MG chewable tablet Chew by mouth.   Yes Historical Provider, MD  cyanocobalamin (,VITAMIN B-12,) 1000 MCG/ML injection Inject 1,000 mcg into the muscle every 30 (thirty) days.   Yes Historical Provider, MD  dicyclomine (BENTYL) 20 MG tablet Take 0.5 mg by mouth 3 (three)  times daily as needed for spasms.    Yes Historical Provider, MD  folic acid (FOLVITE) 1 MG tablet Take 1 mg by mouth daily.    Yes Historical Provider, MD  Iron-Vitamins (GERITOL COMPLETE) TABS Take 1 tablet by mouth daily.    Yes Historical Provider, MD  ketoconazole (NIZORAL) 2 % cream Apply 1 application topically daily.    Yes Historical Provider, MD  Lifitegrast Benay Spice(XIIDRA) 5 % SOLN Place 1 drop into both eyes 2 (two) times daily.   Yes Historical Provider, MD  Magnesium 250 MG TABS Take 1 tablet by mouth daily.   Yes Historical Provider, MD  memantine (NAMENDA) 10 MG tablet Take 5 mg by mouth 2 (two) times daily.  07/26/14  Yes Historical Provider, MD  PARoxetine (PAXIL) 10 MG tablet Take 10 mg by mouth daily.    Yes Historical Provider, MD  QUEtiapine (SEROQUEL) 25 MG tablet Take 25 mg by mouth at bedtime.   Yes Historical Provider, MD  apixaban (ELIQUIS) 5 MG TABS tablet Take 2 tablets (10 mg total) by mouth 2 (two) times daily. 02/09/15 02/14/15  Gale Journeyatherine P Walsh, MD  levofloxacin (LEVAQUIN) 250 MG tablet Take 1 tablet (250 mg total) by mouth daily. Patient not taking: Reported on 07/22/2015 02/09/15   Gale Journeyatherine P Walsh, MD  mometasone-formoterol [facility](DULERA) 100-5 MCG/ACT AERO Inhale 2 puffs into the lungs 2 (two) times daily. Patient not taking: Reported on 07/22/2015 02/09/15   Gale Journeyatherine P Walsh, MD  QUEtiapine (SEROQUEL) 25 MG tablet Take 25 mg by mouth at bedtime.  07/12/14 07/07/15  Historical Provider, MD      VITAL SIGNS:  Blood pressure 170/85, pulse 78, temperature 98.2 F (36.8 C), temperature source Oral, resp. rate 18, height 5' (1.524 m), weight 58.968 kg (130 lb), SpO2 100 %.  PHYSICAL EXAMINATION:  Physical Exam  GENERAL:  82 y.o.-year-old patient lying in the bed with no acute distress.  EYES: Pupils equal, round, reactive to light and accommodation. No scleral icterus. Extraocular muscles intact.  HEENT: Head atraumatic, normocephalic. Oropharynx and nasopharynx clear.  NECK:   Supple, no jugular venous distention. No thyroid enlargement, no tenderness.  LUNGS: Normal breath sounds bilaterally, no wheezing, rales,rhonchi or crepitation. No use of accessory muscles of respiration.  CARDIOVASCULAR: S1, S2 normal. No murmurs, rubs, or gallops.  ABDOMEN: Soft, nontender, nondistended. Bowel sounds present. No organomegaly or mass.  EXTREMITIES: No pedal edema, cyanosis, or clubbing.Patient's right hip is shortened but doesn't really appear to be externally rotated at this time   NEUROLOGIC: Cranial nerves II through XII are intact. Muscle strength 5/5 in all extremities. Sensation intact. Gait not checked.  PSYCHIATRICAwake and alert to self, she is confused about location and time. SKIN: No obvious rash, lesion, or ulcer.   LABORATORY PANEL:   CBC  Recent Labs Lab 07/21/15 2318  WBC 8.2  HGB 12.7  HCT 38.4  PLT 223   ------------------------------------------------------------------------------------------------------------------  Chemistries   Recent  Labs Lab 07/21/15 2318  NA 139  K 3.5  CL 106  CO2 29  GLUCOSE 114*  BUN 14  CREATININE 0.95  CALCIUM 9.2   ------------------------------------------------------------------------------------------------------------------  Cardiac Enzymes  Recent Labs Lab 07/21/15 2318  TROPONINI <0.03   ------------------------------------------------------------------------------------------------------------------  RADIOLOGY:  Dg Chest 1 View  07/22/2015  CLINICAL DATA:  Preoperative chest radiograph. EXAM: CHEST 1 VIEW COMPARISON:  None. FINDINGS: The heart size and mediastinal contours are within normal limits. Both lungs are clear. The visualized skeletal structures are unremarkable. IMPRESSION: No active disease. Electronically Signed   By: Ted Mcalpine M.D.   On: 07/22/2015 00:00   Dg Knee 1-2 Views Right  07/22/2015  CLINICAL DATA:  Right hip and right knee pain after a fall. EXAM: RIGHT KNEE  - 1-2 VIEW COMPARISON:  None. FINDINGS: Degenerative changes in the right knee with medial greater than lateral compartment narrowing and tricompartment osteophyte formation. There is a small right knee effusion without hemarthrosis. No evidence of acute fracture or dislocation. No focal bone lesion or bone destruction. Vascular calcifications. IMPRESSION: Moderate tricompartment degenerative changes in the right knee. Small right knee effusion. No acute displaced fractures identified. Electronically Signed   By: Burman Nieves M.D.   On: 07/22/2015 00:01   Ct Head Wo Contrast  07/22/2015  CLINICAL DATA:  Status post fall. EXAM: CT HEAD WITHOUT CONTRAST TECHNIQUE: Contiguous axial images were obtained from the base of the skull through the vertex without intravenous contrast. COMPARISON:  01/15/2012 FINDINGS: Brain: No evidence of acute infarction, hemorrhage, extra-axial collection, ventriculomegaly, or mass effect. There is moderate brain parenchymal volume loss. Extensive microvascular ischemic changes are also seen. Vascular: No hyperdense vessel or unexpected calcification. Skull: Negative for fracture or focal lesion. Sinuses/Orbits: Mild opacification of the left sphenoid sinus, otherwise no acute findings. Other: None. IMPRESSION: No acute intracranial abnormality. Atrophy, chronic microvascular disease. Electronically Signed   By: Ted Mcalpine M.D.   On: 07/22/2015 00:17   Dg Hip Unilat With Pelvis 2-3 Views Right  07/22/2015  CLINICAL DATA:  Mechanical fall. Right hip and right knee pain. Shortening and external rotation. EXAM: DG HIP (WITH OR WITHOUT PELVIS) 2-3V RIGHT COMPARISON:  None. FINDINGS: Acute transverse fracture of the right subcapital femoral neck with superior subluxation of the distal fracture fragment resulting in varus angulation of the fracture fragments. The pelvis appears intact. SI joints and symphysis pubis are not displaced. Mild degenerative changes in the lower  lumbar spine and hips. Soft tissue calcifications likely represent injection granulomas. IMPRESSION: Acute transverse fracture of the subcapital right femoral neck with varus angulation. Electronically Signed   By: Burman Nieves M.D.   On: 07/22/2015 00:00      IMPRESSION AND PLAN:   19 female admitted for a right hip fracture.  1. Right hip fracture. Patient was previously diagnosed with PE in December 2016 and has been L Oquist 5 Mg Twice a Day. Daughter said that she administered the last dose the evening of Friday, June 16. Medication is currently on hold, would wait 48 hours before proceeding with surgery. Patient has no prior history of CVA, CHF, CAD, CK D she is not diabetic. Based on past medical history do not see any contraindication to surgery at this time. Further cardiac workup indicated. EKG shows a normal sinus rhythm. We'll need to wait for L Oquist to metabolize before proceeding however. Afterwards we will consult PT for rehabilitation.   2. Dementia. Continue Namenda 10 mg daily, paroxetine 10 mg daily and  Seroquel 25 mg daily at bedtime   3. History of PE. Patient diagnosed in December 2016, has been on Eliquis past 6 months,Eliquis currently on hold. No prior DVT/PE.   4. DVT prophylaxis. SCDs at this time.  Case discussed with daughter and the bedside   All the records are reviewed and case discussed with ED provider. Management plans discussed with the patient, family and they are in agreement.  CODE STATUFull code. Daughter is going to discuss with rest of her siblings  AL TIME TAKING CARE OF THIS PATIENT:  40 minutes.    Joella Prince M.D on 07/22/2015 at 2:02 AM  Between 7am to 6pm - Pager - [phone]  After 6pm go to [email or web address] - Social research officer, government  [facility] [place] [facility]  Office  [phone]  CC: Primary care physician; Rolm Gala, MD   Note: This dictation was prepared with Dragon dictation along with smaller phrase  technology. Any transcriptional errors that result from this process are unintentional.

## 2015-07-22 NOTE — Care Management Note (Addendum)
Case Management Note  Patient Details  Name: [name] MRN: [id number] Date of Birth: 09/12/1924  Subjective/Objective:     82yo [name] was admitted from home on 07/21/15 after a fall at home resulted in a right femoral hip fracture. She is on chronic Eliquis so surgery by Dr Martha ClanKrasinski is delayed until Monday. Until then she is in [employer]. Hx of a Pulmonary Embolis in Dec 2016 and Dementia. Family is extremely supportive. PCP=Dr Heidi Grandis. Pharmacy = [facility] in [place]. Has a rolling walker and a wheelchair at home. Lives in her own home. She has 7 day a week sitters from 8am to 8pm, then [facility]. Plan is for diischarge to Rehab per family report and have updated CSW of discharge plan. No case management needs anticipated.                Action/Plan:   Expected Discharge Date:                  Expected Discharge Plan:     In-House Referral:     Discharge planning Services     Post Acute Care Choice:    Choice offered to:     DME Arranged:    DME Agency:     HH Arranged:    HH Agency:     Status of Service:     Medicare Important Message Given:    Date Medicare IM Given:    Medicare IM give by:    Date Additional Medicare IM Given:    Additional Medicare Important Message give by:     If discussed at Long Length of Stay Meetings, dates discussed:    Additional Comments:  Bralee Feldt A, RN 07/22/2015, 4:06 PM

## 2015-07-22 NOTE — ED Notes (Signed)
Lights dimmed at this time, family at bedside, pt resting comfortably at this time.

## 2015-07-22 NOTE — Progress Notes (Signed)
Patient resting, continues with pain to right hip. Surgical intervention tentative on 07/23/15.

## 2015-07-22 NOTE — Progress Notes (Signed)
New Admission Note:   Arrival Method: per bed from ED, pt came from home with daughter Mental Orientation: alert and oriented X4 Telemetry: none ordered Assessment: Completed Skin: warm, dry, flaky on bilateral lower extremities. Per pt's daughter, pt has psoriasis. [facility], intact Pain: 8/10 scale on the right hip. Will administer PRN pain medicine as ordered. Tubes: foley catheter inserted aseptically as ordered Safety Measures: Safety Fall Prevention Plan has been given, discussed and signed Admission: Completed 1A Orientation: Patient has been oriented to the room, unit and staff.  Family: daughter Elita Quickam at bedside.  Orders have been reviewed and implemented. Will continue to monitor the patient. Call light has been placed within reach and bed alarm has been activated.   Janice NorrieAnessa Paetyn Pietrzak BSN, RN [facility] 1A

## 2015-07-22 NOTE — Care Management Note (Signed)
Case Management Note  Patient Details  Name: [name] MRN: [id number] Date of Birth: 08/25/1924  Subjective/Objective:     Family member wanting to discuss pain control issues were redirected back to [name]'s nurse to express their concerns.                 Action/Plan:   Expected Discharge Date:                  Expected Discharge Plan:     In-House Referral:     Discharge planning Services     Post Acute Care Choice:    Choice offered to:     DME Arranged:    DME Agency:     HH Arranged:    HH Agency:     Status of Service:     Medicare Important Message Given:    Date Medicare IM Given:    Medicare IM give by:    Date Additional Medicare IM Given:    Additional Medicare Important Message give by:     If discussed at Long Length of Stay Meetings, dates discussed:    Additional Comments:  Ranen Doolin A, RN 07/22/2015, 5:03 PM

## 2015-07-22 NOTE — ED Provider Notes (Signed)
[facility] Emergency Department Provider Note   ____________________________________________  Time seen: Approximately 2326 PM  I have reviewed the triage vital signs and the nursing notes.   HISTORY  Chief Complaint Fall and Hip Pain  In from patient family member  HPI [name] is a 82 y.o. female who comes into the hospital today with a witnessed mechanical fall. The patient was getting into bed and her family reports that she may have tripped over her robe or her knee may have given out they're unsure but the report that she just went down. The patient fell onto her right hip but denies hitting her head. The patient does have a history of dementia and she does not remember exactly what occurred. The patient did receive fentanyl by EMS and currently feels very sleepy. The patient seemed to be in some pain in her right hip and her leg was shortened and rotated so they decided to bring her in to be evaluated for a possible hip fracture. The patient reports that when she is not moving her pain is under control and she reports it is not that bad.   Past Medical History  Diagnosis Date  . Vaginal bleeding   . Urethra, polyp   . Blood in stool   . Hematuria   . IBS (irritable bowel syndrome)   . Eczema   . Insomnia   . Osteopenia   . Anterolisthesis   . Dementia   . LVH (left ventricular hypertrophy) due to hypertensive disease   . Hypertension     Patient Active Problem List   Diagnosis Date Noted  . Acute pulmonary embolism (HCC) 02/09/2015  . Malnutrition of moderate degree 02/07/2015  . Hypokalemia 02/05/2015  . SPL (spondylolisthesis) 07/28/2014  . Osteopenia 07/28/2014  . Poor balance 07/28/2014  . Adynamia 07/28/2014  . Hypertensive left ventricular hypertrophy 12/21/2013  . Disorder of rotator cuff 07/09/2013  . Dementia 01/25/2013  . Regurgitation, of nonorganic origin, of food with reswallowing 01/25/2013  . Blood pressure  elevated 12/28/2012  . Amnesia 11/19/2010  . Dermatitis, eczematoid 08/20/2010  . Difficulty hearing 08/20/2010  . Adaptive colitis 08/20/2010  . Cannot sleep 08/20/2010  . Affective disorder (HCC) 08/20/2010    History reviewed. No pertinent past surgical history.  Current Outpatient Rx  Name  Route  Sig  Dispense  Refill  . apixaban (ELIQUIS) 5 MG TABS tablet   Oral   Take 1 tablet (5 mg total) by mouth 2 (two) times daily.   60 tablet   11   . aspirin 81 MG chewable tablet   Oral   Chew by mouth.         . cyanocobalamin (,VITAMIN B-12,) 1000 MCG/ML injection   Intramuscular   Inject 1,000 mcg into the muscle every 30 (thirty) days.         Marland Kitchen dicyclomine (BENTYL) 20 MG tablet   Oral   Take 0.5 mg by mouth 3 (three) times daily as needed for spasms.          . folic acid (FOLVITE) 1 MG tablet   Oral   Take 1 mg by mouth daily.          . Iron-Vitamins (GERITOL COMPLETE) TABS   Oral   Take 1 tablet by mouth daily.          Marland Kitchen ketoconazole (NIZORAL) 2 % cream   Topical   Apply 1 application topically daily.          Marland Kitchen  Lifitegrast (XIIDRA) 5 % SOLN   Both Eyes   Place 1 drop into both eyes 2 (two) times daily.         . Magnesium 250 MG TABS   Oral   Take 1 tablet by mouth daily.         . memantine (NAMENDA) 10 MG tablet   Oral   Take 5 mg by mouth 2 (two) times daily.          Marland Kitchen. PARoxetine (PAXIL) 10 MG tablet   Oral   Take 10 mg by mouth daily.          . QUEtiapine (SEROQUEL) 25 MG tablet   Oral   Take 25 mg by mouth at bedtime.         Marland Kitchen. EXPIRED: apixaban (ELIQUIS) 5 MG TABS tablet   Oral   Take 2 tablets (10 mg total) by mouth 2 (two) times daily.   12 tablet   0   . levofloxacin (LEVAQUIN) 250 MG tablet   Oral   Take 1 tablet (250 mg total) by mouth daily. Patient not taking: Reported on 07/22/2015   2 tablet   0   . mometasone-formoterol (DULERA) 100-5 MCG/ACT AERO   Inhalation   Inhale 2 puffs into the lungs 2  (two) times daily. Patient not taking: Reported on 07/22/2015   1 Inhaler   0   . EXPIRED: QUEtiapine (SEROQUEL) 25 MG tablet   Oral   Take 25 mg by mouth at bedtime.            Allergies Azithromycin; Bactrim; Other; Penicillins; Sulfa antibiotics; and Vibramycin  History reviewed. No pertinent family history.  Social History Social History  Substance Use Topics  . Smoking status: Never Smoker   . Smokeless tobacco: None  . Alcohol Use: No    Review of Systems Constitutional: No fever/chills Eyes: No visual changes. ENT: No sore throat. Cardiovascular: Denies chest pain. Respiratory: Denies shortness of breath. Gastrointestinal: No abdominal pain.  No nausea, no vomiting.  No diarrhea.  No constipation. Genitourinary: Negative for dysuria. Musculoskeletal: Right hip pain and knee pain Skin: Negative for rash. Neurological: Negative for headaches, focal weakness or numbness.  10-point ROS otherwise negative.  ____________________________________________   PHYSICAL EXAM:  VITAL SIGNS: ED Triage Vitals  Enc Vitals Group     BP 07/21/15 2306 190/90 mmHg     Pulse Rate 07/21/15 2306 76     Resp 07/21/15 2306 16     Temp 07/21/15 2306 98.2 F (36.8 C)     Temp Source 07/21/15 2306 Oral     SpO2 07/21/15 2306 95 %     Weight 07/21/15 2306 130 lb (58.968 kg)     Height 07/21/15 2306 5' (1.524 m)     Head Cir --      Peak Flow --      Pain Score 07/21/15 2307 2     Pain Loc --      Pain Edu? --      Excl. in GC? --     Constitutional: Alert With some mild confusion. Well appearing and in no acute distress. Eyes: Conjunctivae are normal. PERRL. EOMI. Head: Atraumatic. Nose: No congestion/rhinnorhea. Mouth/Throat: Mucous membranes are moist.  Oropharynx non-erythematous. Neck: No cervical spine tenderness to palpation. Cardiovascular: Normal rate, regular rhythm. Grossly normal heart sounds.  Good peripheral circulation. Respiratory: Normal respiratory  effort.  No retractions. Lungs CTAB. Gastrointestinal: Soft and nontender. No distention. Musculoskeletal: Right leg shortened and extra  only rotated, some tenderness to palpation of anterior right hip and pain with rocking of hip. Neurologic:  Normal speech and language.  Skin:  Skin is warm, dry and intact. Marland Kitchen Psychiatric: Mood and affect are normal.   ____________________________________________   LABS (all labs ordered are listed, but only abnormal results are displayed)  Labs Reviewed  CBC - Abnormal; Notable for the following:    RDW 15.5 (*)    All other components within normal limits  BASIC METABOLIC PANEL - Abnormal; Notable for the following:    Glucose, Bld 114 (*)    GFR calc non Af Amer 52 (*)    GFR calc Af Amer 60 (*)    Anion gap 4 (*)    All other components within normal limits  TROPONIN I   ____________________________________________  EKG  ED ECG REPORT I, Rebecka Apley, the attending physician, personally viewed and interpreted this ECG.   Date: 07/21/2015  EKG Time: 2307  Rate: 73  Rhythm: normal sinus rhythm  Axis: normal  Intervals:none  ST&T Change: normal  ____________________________________________  RADIOLOGY  Ct head: No acute intracranial abnormality, Atrophy, chronic microvascular disease  Chest x-ray: No active disease  Right hip x-ray: Acute transverse fracture of the subcapital right femoral neck with varus angulation  Right knee x-ray: Moderate tricompartmental degenerative changes in the right knee, small right knee effusion, no acute displaced fractures identified. ____________________________________________   PROCEDURES  Procedure(s) performed: None  Critical Care performed: No  ____________________________________________   INITIAL IMPRESSION / ASSESSMENT AND PLAN / ED COURSE  Pertinent labs & imaging results that were available during my care of the patient were reviewed by me and considered in my medical  decision making (see chart for details).  This is an 82 year old female who comes into the hospital today with some right hip pain after a fall. The concern is that the patient may have a hip fracture. We did perform an x-ray and did discover a fracture. I did give the patient some morphine 2 mg IV 1 dose as her pain started to return and I did contact the hospitalists as well as Dr. Martha Clan the orthopedist. The patient will be admitted to the hospitalist service. ____________________________________________   FINAL CLINICAL IMPRESSION(S) / ED DIAGNOSES  Final diagnoses:  Fall  Hip fracture, right, closed, initial encounter (HCC)      NEW MEDICATIONS STARTED DURING THIS VISIT:  New Prescriptions   No medications on file     Note:  This document was prepared using Dragon voice recognition software and may include unintentional dictation errors.    Rebecka Apley, MD 07/22/15 0120

## 2015-07-22 NOTE — ED Notes (Signed)
MD Sullivan at bedside. °

## 2015-07-22 NOTE — NC FL2 (Signed)
[place] MEDICAID FL2 LEVEL OF CARE SCREENING TOOL     IDENTIFICATION  Patient Name: Colleen QualeDorothy L Fuertes Birthdate: 05/19/1924 Sex: female Admission Date (Current Location): 07/21/2015  [place]   Facility and Address:  [facility], [address], [place], [place] [address]      Provider Number: [id number]  Attending Physician Name and Address:  Ramonita LabAruna Gouru, MD  Relative Name and Phone Number:       Current Level of Care: Hospital Recommended Level of Care: Skilled Nursing Facility Prior Approval Number:    Date Approved/Denied:   PASRR Number:  [id number])  Discharge Plan: SNF    Current Diagnoses: Patient Active Problem List   Diagnosis Date Noted  . Closed right hip fracture (HCC) 07/22/2015  . Hip fracture (HCC) 07/22/2015  . Acute pulmonary embolism (HCC) 02/09/2015  . Malnutrition of moderate degree 02/07/2015  . Hypokalemia 02/05/2015  . SPL (spondylolisthesis) 07/28/2014  . Osteopenia 07/28/2014  . Poor balance 07/28/2014  . Adynamia 07/28/2014  . Hypertensive left ventricular hypertrophy 12/21/2013  . Disorder of rotator cuff 07/09/2013  . Dementia 01/25/2013  . Regurgitation, of nonorganic origin, of food with reswallowing 01/25/2013  . Blood pressure elevated 12/28/2012  . Amnesia 11/19/2010  . Dermatitis, eczematoid 08/20/2010  . Difficulty hearing 08/20/2010  . Adaptive colitis 08/20/2010  . Cannot sleep 08/20/2010  . Affective disorder (HCC) 08/20/2010    Orientation RESPIRATION BLADDER Height & Weight     Self, Situation, Time, Place  Normal Continent Weight: 130 lb (58.968 kg) Height:  5' (152.4 cm)  BEHAVIORAL SYMPTOMS/MOOD NEUROLOGICAL BOWEL NUTRITION STATUS   (none )  (none ) Continent Diet (Regular Diet )  AMBULATORY STATUS COMMUNICATION OF NEEDS Skin   Extensive Assist Verbally Surgical wounds                       Personal Care Assistance Level of Assistance  Bathing,  Feeding, Dressing Bathing Assistance: Limited assistance Feeding assistance: Independent Dressing Assistance: Limited assistance     Functional Limitations Info  Sight, Hearing, Speech Sight Info: Adequate Hearing Info: Adequate Speech Info: Adequate    SPECIAL CARE FACTORS FREQUENCY  PT (By licensed PT), OT (By licensed OT)     PT Frequency:  (5) OT Frequency:  (5)            Contractures      Additional Factors Info  Code Status, Allergies Code Status Info:  (Full Code. ) Allergies Info:  (Azithromycin, Bactrim, Other, Penicillins, Sulfa Antibiotics, Vibramycin)           Current Medications (07/22/2015):  This is the current hospital active medication list Current Facility-Administered Medications  Medication Dose Route Frequency Provider Last Rate Last Dose  . [START ON 07/23/2015] 0.9 %  sodium chloride infusion   Intravenous Continuous Joella PrinceJames Sullivan, MD      . acetaminophen (TYLENOL) tablet 650 mg  650 mg Oral Q6H PRN Joella PrinceJames Sullivan, MD       Or  . acetaminophen (TYLENOL) suppository 650 mg  650 mg Rectal Q6H PRN Joella PrinceJames Sullivan, MD      . clindamycin (CLEOCIN) IVPB 600 mg  600 mg Intravenous Once Juanell FairlyKevin Krasinski, MD      . dicyclomine (BENTYL) tablet 10 mg  10 mg Oral TID PRN Arnaldo NatalMichael S Diamond, MD      . folic acid (FOLVITE) tablet 1 mg  1 mg Oral Daily Joella PrinceJames Sullivan, MD   1 mg at 07/22/15  7829  . hydrALAZINE (APRESOLINE) injection 10 mg  10 mg Intravenous Q4H PRN Joella Prince, MD   10 mg at 07/22/15 0358  . HYDROcodone-acetaminophen (NORCO/VICODIN) 5-325 MG per tablet 1-2 tablet  1-2 tablet Oral Q6H PRN Joella Prince, MD      . ketoconazole (NIZORAL) 2 % cream 1 application  1 application Topical Daily Joella Prince, MD   1 application at 07/22/15 1207  . Lifitegrast 5 % SOLN 1 drop  1 drop Both Eyes BID Joella Prince, MD   1 drop at 07/22/15 0442  . magnesium oxide (MAG-OX) tablet 200 mg  200 mg Oral Daily Joella Prince, MD   200 mg at 07/22/15 0908  .  memantine (NAMENDA) tablet 5 mg  5 mg Oral BID Joella Prince, MD   5 mg at 07/22/15 1206  . methocarbamol (ROBAXIN) 500 mg in dextrose 5 % 50 mL IVPB  500 mg Intravenous Q8H PRN Juanell Fairly, MD      . morphine 2 MG/ML injection 2 mg  2 mg Intravenous Q2H PRN Juanell Fairly, MD   2 mg at 07/22/15 1417  . multivitamin with minerals tablet 1 tablet  1 tablet Oral Daily Joella Prince, MD   1 tablet at 07/22/15 0908  . ondansetron (ZOFRAN) tablet 4 mg  4 mg Oral Q6H PRN Joella Prince, MD       Or  . ondansetron [facility]) injection 4 mg  4 mg Intravenous Q6H PRN Joella Prince, MD      . oxyCODONE (Oxy IR/ROXICODONE) immediate release tablet 5-10 mg  5-10 mg Oral Q4H PRN Juanell Fairly, MD      . PARoxetine (PAXIL) tablet 10 mg  10 mg Oral Daily Joella Prince, MD   10 mg at 07/22/15 0909  . QUEtiapine (SEROQUEL) tablet 25 mg  25 mg Oral QHS Joella Prince, MD   25 mg at 07/22/15 0357  . senna-docusate (Senokot-S) tablet 1 tablet  1 tablet Oral QHS PRN Joella Prince, MD         Discharge Medications: Please see discharge summary for a list of discharge medications.  Relevant Imaging Results:  Relevant Lab Results:   Additional Information  (SSN: [id number])  Haig Prophet, LCSW

## 2015-07-23 LAB — BASIC METABOLIC PANEL
ANION GAP: 8 (ref 5–15)
BUN: 19 mg/dL (ref 6–20)
CO2: 25 mmol/L (ref 22–32)
Calcium: 8.5 mg/dL — ABNORMAL LOW (ref 8.9–10.3)
Chloride: 101 mmol/L (ref 101–111)
Creatinine, Ser: 0.8 mg/dL (ref 0.44–1.00)
GFR calc Af Amer: >60 mL/min (ref >60)
Glucose, Bld: 130 mg/dL — ABNORMAL HIGH (ref 65–99)
POTASSIUM: 3.3 mmol/L — AB (ref 3.5–5.1)
SODIUM: 134 mmol/L — AB (ref 135–145)

## 2015-07-23 LAB — CBC
HEMATOCRIT: 36.8 % (ref 35.0–47.0)
Hemoglobin: 12.1 g/dL (ref 12.0–16.0)
MCH: 27.1 pg (ref 26.0–34.0)
MCHC: 32.8 g/dL (ref 32.0–36.0)
MCV: 82.8 fL (ref 80.0–100.0)
Platelets: 181 10*3/uL (ref 150–440)
RBC: 4.45 MIL/uL (ref 3.80–5.20)
RDW: 15.9 % — AB (ref 11.5–14.5)
WBC: 12.6 10*3/uL — ABNORMAL HIGH (ref 3.6–11.0)

## 2015-07-23 LAB — PROTIME-INR
INR: 1.18
Prothrombin Time: 15.2 seconds — ABNORMAL HIGH (ref 11.4–15.0)

## 2015-07-23 LAB — APTT: aPTT: 39 seconds — ABNORMAL HIGH (ref 24–36)

## 2015-07-23 LAB — URINE CULTURE: Culture: NO GROWTH

## 2015-07-23 NOTE — Progress Notes (Signed)
   07/23/15 1100  PT Visit Information  Last PT Received On 07/23/15  Reason Eval/Treat Not Completed Medical issues which prohibited therapy (surgery is not done)  Will follow up tomorrow as surgery is pending.  Samul Dadauth Quindon Denker, PT MS Acute Rehab Dept. Number: [facility] [phone] and [facility] [phone]

## 2015-07-23 NOTE — Progress Notes (Signed)
[facility] - [place] at [facility]   PATIENT NAME: [name]    MR#:  [id number]  DATE OF BIRTH:  1924/08/31  SUBJECTIVE:  CHIEF COMPLAINT: Patient is doing fine, pain is tolerable with current pain management Family at bedside.  REVIEW OF SYSTEMS:   Positive ROS: All other systems have been reviewed and were otherwise negative with the exception of those mentioned as above. DRUG ALLERGIES:   Allergies  Allergen Reactions  . Azithromycin Itching  . Bactrim [Sulfamethoxazole-Trimethoprim]   . Other Nausea And Vomiting  . Penicillins Swelling    Has patient had a PCN reaction causing immediate rash, facial/tongue/throat swelling, SOB or lightheadedness with hypotension: Yes Has patient had a PCN reaction causing severe rash involving mucus membranes or skin necrosis: No Has patient had a PCN reaction that required hospitalization No Has patient had a PCN reaction occurring within the last 10 years: Yes If all of the above answers are "NO", then may proceed with Cephalosporin use.  . Sulfa Antibiotics   . Vibramycin [Doxycycline Calcium] Other (See Comments)    Reaction : Unknown    VITALS:  Blood pressure 129/58, pulse 86, temperature 99.7 F (37.6 C), temperature source Oral, resp. rate 18, height 5' (1.524 m), weight 58.968 kg (130 lb), SpO2 93 %.  PHYSICAL EXAMINATION:  GENERAL:  82 y.o.-year-old patient lying in the bed with no acute distress.  EYES: Pupils equal, round, reactive to light and accommodation. No scleral icterus.  HEENT: Head atraumatic, normocephalic. Oropharynx and nasopharynx clear.  NECK:  Supple, no jugular venous distention. No thyroid enlargement, no tenderness.  LUNGS: Normal breath sounds bilaterally, no wheezing, rales,rhonchi or crepitation. No use of accessory muscles of respiration.  CARDIOVASCULAR: S1, S2 normal. No murmurs, rubs, or gallops.  ABDOMEN: Soft, nontender, nondistended. Bowel sounds present. No  organomegaly or mass.  EXTREMITIES: Right hip is abducted, and under traction No pedal edema, cyanosis, or clubbing.  NEUROLOGIC: Patient is lethargic but arousable.  PSYCHIATRIC: The patient is lethargic but arousable and falling asleep SKIN: No obvious rash, lesion, or ulcer.    LABORATORY PANEL:   CBC  Recent Labs Lab 07/23/15 0408  WBC 12.6*  HGB 12.1  HCT 36.8  PLT 181   ------------------------------------------------------------------------------------------------------------------  Chemistries   Recent Labs Lab 07/23/15 0408  NA 134*  K 3.3*  CL 101  CO2 25  GLUCOSE 130*  BUN 19  CREATININE 0.80  CALCIUM 8.5*   ------------------------------------------------------------------------------------------------------------------  Cardiac Enzymes  Recent Labs Lab 07/21/15 2318  TROPONINI <0.03   ------------------------------------------------------------------------------------------------------------------  RADIOLOGY:  Dg Chest 1 View  07/22/2015  CLINICAL DATA:  Preoperative chest radiograph. EXAM: CHEST 1 VIEW COMPARISON:  None. FINDINGS: The heart size and mediastinal contours are within normal limits. Both lungs are clear. The visualized skeletal structures are unremarkable. IMPRESSION: No active disease. Electronically Signed   By: Ted Mcalpine M.D.   On: 07/22/2015 00:00   Dg Knee 1-2 Views Right  07/22/2015  CLINICAL DATA:  Right hip and right knee pain after a fall. EXAM: RIGHT KNEE - 1-2 VIEW COMPARISON:  None. FINDINGS: Degenerative changes in the right knee with medial greater than lateral compartment narrowing and tricompartment osteophyte formation. There is a small right knee effusion without hemarthrosis. No evidence of acute fracture or dislocation. No focal bone lesion or bone destruction. Vascular calcifications. IMPRESSION: Moderate tricompartment degenerative changes in the right knee. Small right knee effusion. No acute displaced  fractures identified. Electronically Signed   By: Burman Nieves  M.D.   On: 07/22/2015 00:01   Ct Head Wo Contrast  07/22/2015  CLINICAL DATA:  Status post fall. EXAM: CT HEAD WITHOUT CONTRAST TECHNIQUE: Contiguous axial images were obtained from the base of the skull through the vertex without intravenous contrast. COMPARISON:  01/15/2012 FINDINGS: Brain: No evidence of acute infarction, hemorrhage, extra-axial collection, ventriculomegaly, or mass effect. There is moderate brain parenchymal volume loss. Extensive microvascular ischemic changes are also seen. Vascular: No hyperdense vessel or unexpected calcification. Skull: Negative for fracture or focal lesion. Sinuses/Orbits: Mild opacification of the left sphenoid sinus, otherwise no acute findings. Other: None. IMPRESSION: No acute intracranial abnormality. Atrophy, chronic microvascular disease. Electronically Signed   By: Ted Mcalpineobrinka  Dimitrova M.D.   On: 07/22/2015 00:17   Dg Hip Unilat With Pelvis 2-3 Views Right  07/22/2015  CLINICAL DATA:  Mechanical fall. Right hip and right knee pain. Shortening and external rotation. EXAM: DG HIP (WITH OR WITHOUT PELVIS) 2-3V RIGHT COMPARISON:  None. FINDINGS: Acute transverse fracture of the right subcapital femoral neck with superior subluxation of the distal fracture fragment resulting in varus angulation of the fracture fragments. The pelvis appears intact. SI joints and symphysis pubis are not displaced. Mild degenerative changes in the lower lumbar spine and hips. Soft tissue calcifications likely represent injection granulomas. IMPRESSION: Acute transverse fracture of the subcapital right femoral neck with varus angulation. Electronically Signed   By: Burman NievesWilliam  Stevens M.D.   On: 07/22/2015 00:00    EKG:   Orders placed or performed during the hospital encounter of 07/21/15  . EKG 12-Lead  . EKG 12-Lead  . ED EKG  . ED EKG    ASSESSMENT AND PLAN:   82 female admitted for a right hip  fracture.  #. Right hip fracture.  Patient was previously diagnosed with PE in December 2016 and has been Eliquis 5 Mg,which is currently on hold,  Ortho -Scheduled for surgery in a.m. . EKG shows a normal sinus rhythm.  Dr. Martha ClanKrasinski is considering surgery on Monday Continue pain management as needed Nothing by mouth after midnight  #Low-grade fever from the underlying fracture -could be from inflammation process Chest x-ray negative. Blood cultures and urine cultures are negative Monitor white blood count Not considering antibiotics at this time  # Dementia. Continue Namenda 10 mg daily, paroxetine 10 mg daily and Seroquel 25 mg daily at bedtime   . # History of PE. Patient diagnosed in December 2016, has been on Eliquis past 6 months,Eliquis currently on hold. No prior DVT/PE.   #. DVT prophylaxis. SCDs at this time.     All the records are reviewed and case discussed with Care Management/Social Workerr. Management plans discussed with the patient, family and they are in agreement.  CODE STATUS: FC  TOTAL TIME TAKING CARE OF THIS PATIENT: 35minutes.   POSSIBLE D/C IN 4 DAYS, DEPENDING ON CLINICAL CONDITION.  Note: This dictation was prepared with Dragon dictation along with smaller phrase technology. Any transcriptional errors that result from this process are unintentional.   Ramonita LabGouru, Zebediah Beezley M.D on 07/23/2015 at 3:16 PM  Between 7am to 6pm - Pager - [phone] After 6pm go to [email or web address] - password [email or web address] [facility]  [facility]  Office  [phone]  CC: Primary care physician; Rolm GalaGRANDIS, HEIDI, MD

## 2015-07-23 NOTE — Progress Notes (Signed)
Subjective:  Patient reports pain as mild to moderate.  Complains of right heel pain.  Buck's traction in place with 10 pound weight.  Family is at the bedside.  Objective:   VITALS:   Filed Vitals:   07/22/15 1529 07/22/15 2040 07/23/15 0453 07/23/15 0748  BP: 143/69 152/72 136/70 129/58  Pulse: 94 92 88 86  Temp: 98.6 F (37 C) 100 F (37.8 C) 99.1 F (37.3 C) 99.7 F (37.6 C)  TempSrc: Oral Oral Oral Oral  Resp: 20 18 18 18   Height:      Weight:      SpO2: 77% 90% 90% 93%    PHYSICAL EXAM:  Patient more sedated today but is arousable.  Right lower extremity:  Skin is intact throughout the right lower extremity. Her thigh and leg compartments are soft and compressible. She has palpable pedal pulses, intact sensation light touch and intact motor function distally. Buck's traction is in place with 10 pounds of weight.  LABS  Results for orders placed or performed during the hospital encounter of 07/21/15 (from the past 24 hour(s))  CULTURE, BLOOD (ROUTINE X 2) w Reflex to ID Panel     Status: None (Preliminary result)   Collection Time: 07/22/15  1:33 PM  Result Value Ref Range   Specimen Description BLOOD RIGHT ANTECUBITAL    Special Requests BOTTLES DRAWN AEROBIC AND ANAEROBIC  5CC    Culture NO GROWTH < 24 HOURS    Report Status PENDING   CULTURE, BLOOD (ROUTINE X 2) w Reflex to ID Panel     Status: None (Preliminary result)   Collection Time: 07/22/15  1:33 PM  Result Value Ref Range   Specimen Description BLOOD LEFT HAND    Special Requests BOTTLES DRAWN AEROBIC AND ANAEROBIC  4CC    Culture NO GROWTH < 24 HOURS    Report Status PENDING   Basic metabolic panel     Status: Abnormal   Collection Time: 07/23/15  4:08 AM  Result Value Ref Range   Sodium 134 (L) 135 - 145 mmol/L   Potassium 3.3 (L) 3.5 - 5.1 mmol/L   Chloride 101 101 - 111 mmol/L   CO2 25 22 - 32 mmol/L   Glucose, Bld 130 (H) 65 - 99 mg/dL   BUN 19 6 - 20 mg/dL   Creatinine, Ser 1.610.80 0.44 -  1.00 mg/dL   Calcium 8.5 (L) 8.9 - 10.3 mg/dL   GFR calc non Af Amer >60 >60 mL/min   GFR calc Af Amer >60 >60 mL/min   Anion gap 8 5 - 15  CBC     Status: Abnormal   Collection Time: 07/23/15  4:08 AM  Result Value Ref Range   WBC 12.6 (H) 3.6 - 11.0 K/uL   RBC 4.45 3.80 - 5.20 MIL/uL   Hemoglobin 12.1 12.0 - 16.0 g/dL   HCT 09.636.8 04.535.0 - 40.947.0 %   MCV 82.8 80.0 - 100.0 fL   MCH 27.1 26.0 - 34.0 pg   MCHC 32.8 32.0 - 36.0 g/dL   RDW 81.115.9 (H) 91.411.5 - 78.214.5 %   Platelets 181 150 - 440 K/uL  Protime-INR     Status: Abnormal   Collection Time: 07/23/15  4:08 AM  Result Value Ref Range   Prothrombin Time 15.2 (H) 11.4 - 15.0 seconds   INR 1.18   APTT     Status: Abnormal   Collection Time: 07/23/15  4:08 AM  Result Value Ref Range   aPTT 39 (H)  24 - 36 seconds    Dg Chest 1 View  07/22/2015  CLINICAL DATA:  Preoperative chest radiograph. EXAM: CHEST 1 VIEW COMPARISON:  None. FINDINGS: The heart size and mediastinal contours are within normal limits. Both lungs are clear. The visualized skeletal structures are unremarkable. IMPRESSION: No active disease. Electronically Signed   By: Ted Mcalpine M.D.   On: 07/22/2015 00:00   Dg Knee 1-2 Views Right  07/22/2015  CLINICAL DATA:  Right hip and right knee pain after a fall. EXAM: RIGHT KNEE - 1-2 VIEW COMPARISON:  None. FINDINGS: Degenerative changes in the right knee with medial greater than lateral compartment narrowing and tricompartment osteophyte formation. There is a small right knee effusion without hemarthrosis. No evidence of acute fracture or dislocation. No focal bone lesion or bone destruction. Vascular calcifications. IMPRESSION: Moderate tricompartment degenerative changes in the right knee. Small right knee effusion. No acute displaced fractures identified. Electronically Signed   By: Burman Nieves M.D.   On: 07/22/2015 00:01   Ct Head Wo Contrast  07/22/2015  CLINICAL DATA:  Status post fall. EXAM: CT HEAD WITHOUT  CONTRAST TECHNIQUE: Contiguous axial images were obtained from the base of the skull through the vertex without intravenous contrast. COMPARISON:  01/15/2012 FINDINGS: Brain: No evidence of acute infarction, hemorrhage, extra-axial collection, ventriculomegaly, or mass effect. There is moderate brain parenchymal volume loss. Extensive microvascular ischemic changes are also seen. Vascular: No hyperdense vessel or unexpected calcification. Skull: Negative for fracture or focal lesion. Sinuses/Orbits: Mild opacification of the left sphenoid sinus, otherwise no acute findings. Other: None. IMPRESSION: No acute intracranial abnormality. Atrophy, chronic microvascular disease. Electronically Signed   By: Ted Mcalpine M.D.   On: 07/22/2015 00:17   Dg Hip Unilat With Pelvis 2-3 Views Right  07/22/2015  CLINICAL DATA:  Mechanical fall. Right hip and right knee pain. Shortening and external rotation. EXAM: DG HIP (WITH OR WITHOUT PELVIS) 2-3V RIGHT COMPARISON:  None. FINDINGS: Acute transverse fracture of the right subcapital femoral neck with superior subluxation of the distal fracture fragment resulting in varus angulation of the fracture fragments. The pelvis appears intact. SI joints and symphysis pubis are not displaced. Mild degenerative changes in the lower lumbar spine and hips. Soft tissue calcifications likely represent injection granulomas. IMPRESSION: Acute transverse fracture of the subcapital right femoral neck with varus angulation. Electronically Signed   By: Burman Nieves M.D.   On: 07/22/2015 00:00    Assessment/Plan: * Surgery Date in Future *   Active Problems:   Closed right hip fracture [facility])   Hip fracture [facility])   Patient will have a right hip hemiarthroplasty tomorrow. She'll be 48 hours off of her Eliquis at that point. She has been given Lovenox for DVT prophylaxis while in bed. Patient having muscle spasms which may be treated with Robaxin IV. I'm going to order he'll  protectors. Patient may remove traction periodically throughout the day if it is contributing to her pain. Patient be nothing by mouth after midnight tonight. Surgery scheduled for tomorrow with an actual surgical time will not be available until tomorrow morning. I answered all questions by the patient's family.   Juanell Fairly , MD 07/23/2015, 11:36 AM

## 2015-07-23 NOTE — Clinical Social Work Note (Signed)
Clinical Social Work Assessment  Patient Details  Name: [name] MRN: [id number] Date of Birth: [birth year]  Date of referral:  07/23/15               Reason for consult:  Facility Placement                Permission sought to share information with:  Chartered certified accountant granted to share information::  Yes, Verbal Permission Granted  Name::      [name]::   [name]   Relationship::     Contact Information:     Housing/Transportation Living arrangements for the past 2 months:  [place] of Information:  Adult Children Patient Interpreter Needed:  None Criminal Activity/Legal Involvement Pertinent to Current Situation/Hospitalization:  No - Comment as needed Significant Relationships:  Adult Children Lives with:  Self Do you feel safe going back to the place where you live?  Yes Need for family participation in patient care:  Yes (Comment)  Care giving concerns:  Patient lives alone in [place] and has hired caregivers for most of the day.    Social Worker assessment / plan:  Holiday representative (CSW) received verbal consult from RN case manager that patient's family is interested in [name]. Patient will have surgery for a hip fracture tomorrow. CSW met with patient and her son [name] prior to surgery today. CSW introduced self and explained role of CSW department. Patient was asleep during assessment. Per son [name] patient has 5 adult children and his sister [name] is patient's HPOA. [name] [phone] & home [phone]. Per son patient's patient lives alone and has hired caregivers come in. CSW explained that PT will evaluate patient after surgery and make a recommendation of home health or SNF. Son prefers SNF and asked CSW to call his sister [name] to discuss D/C plan as well. CSW contacted patient's daughter [name] and made her aware of above. CSW explained to son and daughter that patient will need a 3 night  qualifying inpatient stay at [name] in order for Medicare to pay for SNF. Patient was admitted to inpatient on 07/22/15. Daughter is agreeable to SNF search and prefers [name] or [name].   FL2 complete and faxed out. CSW will continue to follow and assist as needed.   Employment status:  Retired Forensic scientist:  Medicare PT Recommendations:  Not assessed at this time Information / Referral to community resources:  [name]  Patient/Family's Response to care:  Patient's son and daughter are agreeable to [facility].   Patient/Family's Understanding of and Emotional Response to Diagnosis, Current Treatment, and Prognosis:  Patient's son and daughter were pleasant and thanked CSW for visit.   Emotional Assessment Appearance:  Appears stated age Attitude/Demeanor/Rapport:    Affect (typically observed):  Accepting, Adaptable, Pleasant Orientation:  Oriented to Self, Oriented to Place, Oriented to  Time, Oriented to Situation Alcohol / Substance use:  Not Applicable Psych involvement (Current and /or in the community):  No (Comment)  Discharge Needs  Concerns to be addressed:  Discharge Planning Concerns Readmission within the last 30 days:  No Current discharge risk:  Dependent with Mobility Barriers to Discharge:  Continued Medical Work up   Colleen Freshwater, LCSW 07/23/2015, 2:56 PM

## 2015-07-23 NOTE — Clinical Social Work Placement (Signed)
   CLINICAL SOCIAL WORK PLACEMENT  NOTE  Date:  07/23/2015  Patient Details  Name: Colleen QualeDorothy L Gehlhausen MRN: [id number] Date of Birth: 08/15/1924  Clinical Social Work is seeking post-discharge placement for this patient at the Skilled  Nursing Facility level of care (*CSW will initial, date and re-position this form in  chart as items are completed):  Yes   Patient/family provided with [facility] Clinical Social Work Department's list of facilities offering this level of care within the geographic area requested by the patient (or if unable, by the patient's family).  Yes   Patient/family informed of their freedom to choose among providers that offer the needed level of care, that participate in Medicare, Medicaid or managed care program needed by the patient, have an available bed and are willing to accept the patient.  Yes   Patient/family informed of 's ownership interest in [facility] and [facility], as well as of the fact that they are under no obligation to receive care at these facilities.  PASRR submitted to EDS on 07/23/15     PASRR number received on 07/23/15     Existing PASRR number confirmed on       FL2 transmitted to all facilities in geographic area requested by pt/family on 07/23/15     FL2 transmitted to all facilities within larger geographic area on       Patient informed that his/her managed care company has contracts with or will negotiate with certain facilities, including the following:            Patient/family informed of bed offers received.  Patient chooses bed at       Physician recommends and patient chooses bed at      Patient to be transferred to   on  .  Patient to be transferred to facility by       Patient family notified on   of transfer.  Name of family member notified:        PHYSICIAN       Additional Comment:    _______________________________________________ Haig ProphetMorgan, Mystery Schrupp G, LCSW 07/23/2015, 2:53 PM

## 2015-07-23 NOTE — Progress Notes (Signed)
TC to Ortho MD to clarify orders: patient may have regular diet at this time, begin at midnight NPO for tentative surgery on 07/24/15, time is not specified at this time. Last dose of Lovenox given this am and discontinued until after surgical procedure. Will continue to monitor.

## 2015-07-24 ENCOUNTER — Encounter
Admission: EM | Disposition: A | Discharge status: Skilled Nursing Facility | Payer: Self-pay | Source: Home / Self Care | Attending: Internal Medicine

## 2015-07-24 ENCOUNTER — Inpatient Hospital Stay: Payer: Medicare Other | Admitting: Anesthesiology

## 2015-07-24 LAB — BASIC METABOLIC PANEL
Anion gap: 7 (ref 5–15)
Anion gap: 6 (ref 5–15)
BUN: 19 mg/dL (ref 6–20)
BUN: 17 mg/dL (ref 6–20)
CALCIUM: 8.5 mg/dL — AB (ref 8.9–10.3)
CALCIUM: 8.4 mg/dL — AB (ref 8.9–10.3)
CO2: 25 mmol/L (ref 22–32)
CO2: 23 mmol/L (ref 22–32)
CREATININE: 0.8 mg/dL (ref 0.44–1.00)
CREATININE: 0.73 mg/dL (ref 0.44–1.00)
Chloride: 106 mmol/L (ref 101–111)
Chloride: 107 mmol/L (ref 101–111)
GFR calc Af Amer: >60 mL/min (ref >60)
GFR calc non Af Amer: >60 mL/min (ref >60)
GFR calc non Af Amer: >60 mL/min (ref >60)
GLUCOSE: 116 mg/dL — AB (ref 65–99)
GLUCOSE: 112 mg/dL — AB (ref 65–99)
Potassium: 3.1 mmol/L — ABNORMAL LOW (ref 3.5–5.1)
Potassium: 3.8 mmol/L (ref 3.5–5.1)
Sodium: 138 mmol/L (ref 135–145)
Sodium: 136 mmol/L (ref 135–145)

## 2015-07-24 LAB — CBC WITH DIFFERENTIAL/PLATELET
Basophils Absolute: 0.1 10*3/uL (ref 0–0.1)
Eosinophils Absolute: 0.5 10*3/uL (ref 0–0.7)
HEMATOCRIT: 34.3 % — AB (ref 35.0–47.0)
Hemoglobin: 11.5 g/dL — ABNORMAL LOW (ref 12.0–16.0)
Lymphocytes Relative: 9 %
Lymphs Abs: 1 10*3/uL (ref 1.0–3.6)
MCH: 28 pg (ref 26.0–34.0)
MCHC: 33.4 g/dL (ref 32.0–36.0)
MCV: 83.8 fL (ref 80.0–100.0)
MONO ABS: 0.8 10*3/uL (ref 0.2–0.9)
NEUTROS ABS: 7.9 10*3/uL — AB (ref 1.4–6.5)
Neutrophils Relative %: 77 %
PLATELETS: 152 10*3/uL (ref 150–440)
RBC: 4.09 MIL/uL (ref 3.80–5.20)
RDW: 15.7 % — AB (ref 11.5–14.5)
WBC: 10.2 10*3/uL (ref 3.6–11.0)

## 2015-07-24 SURGERY — CANCELLED PROCEDURE
Anesthesia: Spinal | Laterality: Right

## 2015-07-24 MED ORDER — PHENYLEPHRINE HCL 10 MG/ML IJ SOLN
INTRAMUSCULAR | Status: DC | PRN
  Administered 2015-07-24: 100 ug via INTRAVENOUS

## 2015-07-24 MED ORDER — IPRATROPIUM-ALBUTEROL 0.5-2.5 (3) MG/3ML IN SOLN: 3.0000 mL | Freq: Four times a day (QID) | RESPIRATORY_TRACT | Status: DC | PRN

## 2015-07-24 MED ORDER — IPRATROPIUM-ALBUTEROL 0.5-2.5 (3) MG/3ML IN SOLN
RESPIRATORY_TRACT | Status: AC
  Filled 2015-07-24: qty 3

## 2015-07-24 MED ORDER — BUPIVACAINE HCL (PF) 0.5 % IJ SOLN
INTRAMUSCULAR | Status: DC | PRN
  Administered 2015-07-24: 2.5 mL via PERINEURAL

## 2015-07-24 MED ORDER — KETAMINE HCL 50 MG/ML IJ SOLN
INTRAMUSCULAR | Status: DC | PRN
  Administered 2015-07-24 (×2): 10 mg via INTRAMUSCULAR
  Administered 2015-07-24: 15 mg via INTRAMUSCULAR

## 2015-07-24 MED ORDER — IPRATROPIUM-ALBUTEROL 0.5-2.5 (3) MG/3ML IN SOLN
RESPIRATORY_TRACT | Status: AC
  Administered 2015-07-24: 3 mL via RESPIRATORY_TRACT
  Filled 2015-07-24: qty 3

## 2015-07-24 MED ORDER — CLINDAMYCIN PHOSPHATE 600 MG/50ML IV SOLN
INTRAVENOUS | Status: AC
  Filled 2015-07-24: qty 50

## 2015-07-24 MED ORDER — MIDAZOLAM HCL 5 MG/5ML IJ SOLN
INTRAMUSCULAR | Status: DC | PRN
  Administered 2015-07-24: 1 mg via INTRAVENOUS

## 2015-07-24 MED ORDER — LACTATED RINGERS IV BOLUS (SEPSIS)
200.0000 mL | Freq: Once | INTRAVENOUS | Status: AC
Start: 2015-07-24 — End: 2015-07-24
  Administered 2015-07-24: 200 mL via INTRAVENOUS

## 2015-07-24 MED ORDER — POTASSIUM CHLORIDE 10 MEQ/100ML IV SOLN
10.0000 meq | INTRAVENOUS | Status: AC
  Administered 2015-07-24 (×4): 10 meq via INTRAVENOUS
  Filled 2015-07-24 (×4): qty 100

## 2015-07-24 MED ORDER — LACTATED RINGERS IV SOLN
INTRAVENOUS | Status: DC | PRN
Start: 2015-07-24 — End: 2015-07-24
  Administered 2015-07-24: 14:00:00 via INTRAVENOUS

## 2015-07-24 MED ORDER — ENOXAPARIN SODIUM 40 MG/0.4ML ~~LOC~~ SOLN: 40.0000 mg | SUBCUTANEOUS | Status: DC

## 2015-07-24 MED ORDER — IPRATROPIUM-ALBUTEROL 0.5-2.5 (3) MG/3ML IN SOLN
3.0000 mL | Freq: Once | RESPIRATORY_TRACT | Status: AC
  Administered 2015-07-24: 3 mL via RESPIRATORY_TRACT

## 2015-07-24 MED ORDER — IPRATROPIUM-ALBUTEROL 0.5-2.5 (3) MG/3ML IN SOLN
3.0000 mL | Freq: Four times a day (QID) | RESPIRATORY_TRACT | Status: DC
  Filled 2015-07-24: qty 3

## 2015-07-24 MED ORDER — MORPHINE SULFATE (PF) 2 MG/ML IV SOLN
2.0000 mg | INTRAVENOUS | Status: DC | PRN
Start: 2015-07-24 — End: 2015-07-26
  Administered 2015-07-24 – 2015-07-25 (×4): 2 mg via INTRAVENOUS
  Filled 2015-07-24 (×4): qty 1

## 2015-07-24 MED ORDER — SACCHAROMYCES BOULARDII 250 MG PO CAPS
250.0000 mg | ORAL_CAPSULE | Freq: Two times a day (BID) | ORAL | Status: DC
  Administered 2015-07-24 – 2015-07-29 (×7): 250 mg via ORAL
  Filled 2015-07-24 (×8): qty 1

## 2015-07-24 MED ORDER — METRONIDAZOLE IN NACL 5-0.79 MG/ML-% IV SOLN
500.0000 mg | Freq: Three times a day (TID) | INTRAVENOUS | Status: DC
  Administered 2015-07-24 – 2015-07-26 (×6): 500 mg via INTRAVENOUS
  Filled 2015-07-24 (×10): qty 100

## 2015-07-24 MED ORDER — NEOMYCIN-POLYMYXIN B GU 40-200000 IR SOLN
Status: AC
  Filled 2015-07-24: qty 20

## 2015-07-24 SURGICAL SUPPLY — 45 items
BLADE SAGITTAL WIDE XTHICK NO (BLADE) IMPLANT
BLADE SURG SZ10 CARB STEEL (BLADE) IMPLANT
CANISTER SUCT 1200ML W/VALVE (MISCELLANEOUS) IMPLANT
CANISTER SUCT 3000ML PPV (MISCELLANEOUS) IMPLANT
DRAPE IMP U-DRAPE 54X76 (DRAPES) IMPLANT
DRAPE INCISE IOBAN 66X60 STRL (DRAPES) IMPLANT
DRAPE SHEET LG 3/4 BI-LAMINATE (DRAPES) IMPLANT
DRAPE SURG 17X11 SM STRL (DRAPES) IMPLANT
DRAPE TABLE BACK 80X90 (DRAPES) IMPLANT
DRSG OPSITE POSTOP 4X10 (GAUZE/BANDAGES/DRESSINGS) IMPLANT
DURAPREP 26ML APPLICATOR (WOUND CARE) IMPLANT
ELECT BLADE 6.5 EXT (BLADE) IMPLANT
ELECT CAUTERY BLADE 6.4 (BLADE) IMPLANT
ELECT REM PT RETURN 9FT ADLT (ELECTROSURGICAL)
ELECTRODE REM PT RTRN 9FT ADLT (ELECTROSURGICAL) IMPLANT
GAUZE PETRO XEROFOAM 1X8 (MISCELLANEOUS) IMPLANT
GAUZE SPONGE 4X4 12PLY STRL (GAUZE/BANDAGES/DRESSINGS) IMPLANT
GLOVE BIOGEL PI IND STRL 9 (GLOVE) IMPLANT
GLOVE BIOGEL PI INDICATOR 9 (GLOVE)
GLOVE SURG 9.0 ORTHO LTXF (GLOVE) IMPLANT
GOWN STRL REUS TWL 2XL XL LVL4 (GOWN DISPOSABLE) IMPLANT
GOWN STRL REUS W/ TWL LRG LVL3 (GOWN DISPOSABLE) IMPLANT
GOWN STRL REUS W/TWL LRG LVL3 (GOWN DISPOSABLE)
HANDPIECE SUCTION TUBG SURGILV (MISCELLANEOUS) IMPLANT
HEMOVAC 400ML (MISCELLANEOUS)
KIT DRAIN HEMOVAC JP 7FR 400ML (MISCELLANEOUS) IMPLANT
KIT RM TURNOVER STRD PROC AR (KITS) IMPLANT
NDL SAFETY 18GX1.5 (NEEDLE) IMPLANT
NEEDLE FILTER BLUNT 18X 1/2SAF (NEEDLE)
NEEDLE FILTER BLUNT 18X1 1/2 (NEEDLE) IMPLANT
NEEDLE MAYO CATGUT SZ4 (NEEDLE) IMPLANT
NS IRRIG 1000ML POUR BTL (IV SOLUTION) IMPLANT
PACK HIP PROSTHESIS (MISCELLANEOUS) IMPLANT
PILLOW ABDUC SM (MISCELLANEOUS) IMPLANT
RETRIEVER SUT HEWSON (MISCELLANEOUS) IMPLANT
SOL .9 NS 3000ML IRR  AL (IV SOLUTION)
SOL .9 NS 3000ML IRR UROMATIC (IV SOLUTION) IMPLANT
STAPLER SKIN PROX 35W (STAPLE) IMPLANT
SUT MNCRL 3 0 RB1 (SUTURE) IMPLANT
SUT MONOCRYL 3 0 RB1 (SUTURE)
SUT TICRON 2-0 30IN 311381 (SUTURE) IMPLANT
SUT VIC AB 0 CT1 36 (SUTURE) IMPLANT
SUT VIC AB 2-0 CT2 27 (SUTURE) IMPLANT
SYRINGE 10CC LL (SYRINGE) IMPLANT
TAPE MICROFOAM 4IN (TAPE) IMPLANT

## 2015-07-24 NOTE — Transfer of Care (Signed)
Immediate Anesthesia Transfer of Care Note  Patient: [name]  Procedure(s) Performed: Procedure(s): ARTHROPLASTY BIPOLAR HIP (HEMIARTHROPLASTY) (Right)  Patient Location: PACU  Anesthesia Type:Spinal  Level of Consciousness: patient cooperative  Airway & Oxygen Therapy: Patient Spontanous Breathing and Patient connected to face mask oxygen  Post-op Assessment: Report given to RN, Post -op Vital signs reviewed and stable and Patient moving all extremities X 4  Post vital signs: Reviewed and stable  Last Vitals:  Filed Vitals:   07/24/15 1516 07/24/15 1520  BP: 74/43 87/65  Pulse:  76  Temp:    Resp: 18 18    Last Pain:  Filed Vitals:   07/24/15 1522  PainSc: 4       Patients Stated Pain Goal: 2 (07/22/15 1418)  Complications: No apparent anesthesia complications

## 2015-07-24 NOTE — Care Management (Signed)
I have notified [facility] that patient is schedule hemiarthroplasty 07/24/15 with Dr. Martha ClanKrasinski- MEDICARE BUNDLE patient.

## 2015-07-24 NOTE — Progress Notes (Signed)
Subjective:  Patient reports pain as mild to moderate.  Patient reported to have some loose stools. Has a history of IBS. Patient's potassium was low and was repleted with IV potassium this morning. Repeat potassium is 3.8.  Objective:   VITALS:   Filed Vitals:   07/23/15 1519 07/23/15 2020 07/24/15 0542 07/24/15 0758  BP: 132/65 129/61 125/57 136/64  Pulse: 81 82 85 94  Temp: 98.7 F (37.1 C) 98.2 F (36.8 C) 97.8 F (36.6 C) 99.5 F (37.5 C)  TempSrc: Oral Oral Oral Oral  Resp: Height:      Weight:      SpO2: 96% 93% 95% 93%    PHYSICAL EXAM:  Right lower extremity:  Neurovascular intact Sensation intact distally Intact pulses distally Dorsiflexion/Plantar flexion intact Compartment soft  LABS  Results for orders placed or performed during the hospital encounter of 07/21/15 (from the past 24 hour(s))  CBC with Differential/Platelet     Status: Abnormal   Collection Time: 07/24/15  4:12 AM  Result Value Ref Range   WBC 10.2 3.6 - 11.0 K/uL   RBC 4.09 3.80 - 5.20 MIL/uL   Hemoglobin 11.5 (L) 12.0 - 16.0 g/dL   HCT 16.1 (L) 09.6 - 04.5 %   MCV 83.8 80.0 - 100.0 fL   MCH 28.0 26.0 - 34.0 pg   MCHC 33.4 32.0 - 36.0 g/dL   RDW 40.9 (H) 81.1 - 91.4 %   Platelets 152 150 - 440 K/uL   Neutrophils Relative % 77% %   Neutro Abs 7.9 (H) 1.4 - 6.5 K/uL   Lymphocytes Relative 9% %   Lymphs Abs 1.0 1.0 - 3.6 K/uL   Monocytes Relative 8% %   Monocytes Absolute 0.8 0.2 - 0.9 K/uL   Eosinophils Relative 5% %   Eosinophils Absolute 0.5 0 - 0.7 K/uL   Basophils Relative 1% %   Basophils Absolute 0.1 0 - 0.1 K/uL  Basic metabolic panel     Status: Abnormal   Collection Time: 07/24/15  4:12 AM  Result Value Ref Range   Sodium 138 135 - 145 mmol/L   Potassium 3.1 (L) 3.5 - 5.1 mmol/L   Chloride 106 101 - 111 mmol/L   CO2 25 22 - 32 mmol/L   Glucose, Bld 116 (H) 65 - 99 mg/dL   BUN 19 6 - 20 mg/dL   Creatinine, Ser 7.82 0.44 - 1.00 mg/dL   Calcium 8.5 (L)  8.9 - 10.3 mg/dL   GFR calc non Af Amer >60 >60 mL/min   GFR calc Af Amer >60 >60 mL/min   Anion gap 7 5 - 15  Basic metabolic panel     Status: Abnormal   Collection Time: 07/24/15 12:18 PM  Result Value Ref Range   Sodium 136 135 - 145 mmol/L   Potassium 3.8 3.5 - 5.1 mmol/L   Chloride 107 101 - 111 mmol/L   CO2 23 22 - 32 mmol/L   Glucose, Bld 112 (H) 65 - 99 mg/dL   BUN 17 6 - 20 mg/dL   Creatinine, Ser 9.56 0.44 - 1.00 mg/dL   Calcium 8.4 (L) 8.9 - 10.3 mg/dL   GFR calc non Af Amer >60 >60 mL/min   GFR calc Af Amer >60 >60 mL/min   Anion gap 6 5 - 15    No results found.  Assessment/Plan: Day of Surgery   Active Problems:   Closed right hip fracture (HCC)   Hip fracture (HCC)  Patient has been off Elo now for over 48 hours. Her potassium was repleted. She is cleared by hospitalist service for surgery. Plan for hemiarthroplasty of the right hip today.   Juanell FairlyKRASINSKI, Brand Siever , MD 07/24/2015, 1:18 PM

## 2015-07-24 NOTE — Progress Notes (Addendum)
[facility] [place] [facility]   PATIENT NAME: Colleen LaughterDorothy Chavez    MR#:  [id number]  DATE OF BIRTH:  12/12/1924  SUBJECTIVE:  CHIEF COMPLAINT: Patient is Having diarrhea today, as reported by family patient has irritable bowel syndrome and gets bouts of diarrhea which is her normal. Her surgery was rescheduled after giving her spinal anesthesia as patient was having profuse diarrhea as reported by Dr. Martha ClanKrasinski. Patient was transferred to PACU for close monitoring  REVIEW OF SYSTEMS:   ROS: CONSTITUTIONAL: No fever, fatigue or weakness.  EYES: No blurred or double vision.  EARS, NOSE, AND THROAT: No tinnitus or ear pain.  RESPIRATORY: No cough, shortness of breath, wheezing or hemoptysis.  CARDIOVASCULAR:Reporting chest discomfort. No orthopnea, edema.  GASTROINTESTINAL: No nausea, vomiting, reporting profuse diarrhea , denies abdominal pain.  GENITOURINARY: No dysuria, hematuria.  ENDOCRINE: No polyuria, nocturia,  HEMATOLOGY: No anemia, easy bruising or bleeding SKIN: No rash or lesion. MUSCULOSKELETAL: No joint pain or arthritis.  NEUROLOGIC: No tingling, numbness, weakness.  PSYCHIATRY: No anxiety or depression.  DRUG ALLERGIES:   Allergies  Allergen Reactions  . Azithromycin Itching  . Bactrim [Sulfamethoxazole-Trimethoprim]   . Other Nausea And Vomiting  . Penicillins Swelling    Has patient had a PCN reaction causing immediate rash, facial/tongue/throat swelling, SOB or lightheadedness with hypotension: Yes Has patient had a PCN reaction causing severe rash involving mucus membranes or skin necrosis: No Has patient had a PCN reaction that required hospitalization No Has patient had a PCN reaction occurring within the last 10 years: Yes If all of the above answers are "NO", then may proceed with Cephalosporin use.  . Sulfa Antibiotics   . Vibramycin [Doxycycline Calcium] Other (See Comments)    Reaction : Unknown    VITALS:  Blood  pressure 107/50, pulse 92, temperature 100.1 F (37.8 C), temperature source Tympanic, resp. rate 19, height 5' (1.524 m), weight 58.968 kg (130 lb), SpO2 97 %.  PHYSICAL EXAMINATION:  GENERAL:  82 y.o.-year-old patient lying in the bed with no acute distress.  EYES: Pupils equal, round, reactive to light and accommodation. No scleral icterus.  HEENT: Head atraumatic, normocephalic. Oropharynx and nasopharynx clear.  NECK:  Supple, no jugular venous distention. No thyroid enlargement, no tenderness.  LUNGS: Normal breath sounds bilaterally, no wheezing, rales,rhonchi or crepitation. No use of accessory muscles of respiration.  CARDIOVASCULAR: S1, S2 normal. No murmurs, rubs, or gallops.  ABDOMEN: Soft, nontender, nondistended. Bowel sounds present. No organomegaly or mass.  EXTREMITIES: Right hip is abducted, and under traction No pedal edema, cyanosis, or clubbing.  NEUROLOGIC: Patient is lethargic but arousable.  PSYCHIATRIC: The patient is lethargic but arousable and falling asleep SKIN: No obvious rash, lesion, or ulcer.    LABORATORY PANEL:   CBC  Recent Labs Lab 07/24/15 0412  WBC 10.2  HGB 11.5*  HCT 34.3*  PLT 152   ------------------------------------------------------------------------------------------------------------------  Chemistries   Recent Labs Lab 07/24/15 1218  NA 136  K 3.8  CL 107  CO2 23  GLUCOSE 112*  BUN 17  CREATININE 0.73  CALCIUM 8.4*   ------------------------------------------------------------------------------------------------------------------  Cardiac Enzymes  Recent Labs Lab 07/21/15 2318  TROPONINI <0.03   ------------------------------------------------------------------------------------------------------------------  RADIOLOGY:  No results found.  EKG:   Orders placed or performed during the hospital encounter of 07/21/15  . EKG 12-Lead  . EKG 12-Lead  . ED EKG  . ED EKG    ASSESSMENT AND PLAN:   [id number] female  admitted for a right  hip fracture.  #. Right hip fracture.  Patient was previously diagnosed with PE in December 2016 and has been Eliquis 5 Mg,which is currently on hold,  Ortho -Scheduled for surgery today but was rescheduled after giving spinal anesthesia as patient was having profuse diarrhea . EKG shows a normal sinus rhythm.  Dr. Martha Clan is considering surgery later was diarrhea improved Continue pain management as needed  #Low-grade fever with the diarrhea  Patient has chronic history of irritable bowel syndrome and gets bouts of diarrhea intermittently which is her normal  Will get stool culture and sensitivity and check stool for occult blood  Enteric precautions  Start patient on IV Flagyl empirically . DC clindamycin. Start probiotics Start patient on IV fluids  Chest x-ray negative. Blood cultures and urine cultures are negative Monitor white blood count  # Dementia. Continue Namenda 10 mg daily, paroxetine 10 mg daily and Seroquel 25 mg daily at bedtime   . # History of PE. Patient diagnosed in December 2016, has been on Eliquis past 6 months,Eliquis currently on hold. No prior DVT/PE.   #. DVT prophylaxis. SCDs at this time.     All the records are reviewed and case discussed with Care Management/Social Workerr. Management plans discussed with the patient, family and they are in agreement.  CODE STATUS: FC  TOTAL TIME TAKING CARE OF THIS PATIENT: .   POSSIBLE D/C IN 4 DAYS, DEPENDING ON CLINICAL CONDITION.  Note: This dictation was prepared with Dragon dictation along with smaller phrase technology. Any transcriptional errors that result from this process are unintentional.   Ramonita Lab M.D on 07/24/2015 at 4:20 PM  Between 7am to 6pm - Pager - [phone] After 6pm go to [email or web address] - password [email or web address] [facility]  [facility]  Office  [phone]  CC: Primary care physician; Rolm Gala, MD

## 2015-07-24 NOTE — OR Nursing (Signed)
On arrival to SDS temp 101.3, O2 sats 92% on 2L [place], other VS as recorded.  Patient denies dyspnea.  Reported temp and sats to Dr. Martha ClanKrasinski and Dr. Mikey BussingVan Stavern.  Order for Duoneb per Dr. Mordecai RasmussenVanstavern. Breath sounds diminished but clear on auscultation.

## 2015-07-24 NOTE — Anesthesia Preprocedure Evaluation (Addendum)
Anesthesia Evaluation  Patient identified by MRN, date of birth, ID band Patient awake    Airway Mallampati: II       Dental  (+) Edentulous Upper, Edentulous Lower   Pulmonary neg pulmonary ROS,    Pulmonary exam normal        Cardiovascular Exercise Tolerance: Poor hypertension, Pt. on medications  Rhythm:Regular     Neuro/Psych negative neurological ROS     GI/Hepatic negative GI ROS, Neg liver ROS,   Endo/Other  negative endocrine ROS  Renal/GU negative Renal ROS     Musculoskeletal negative musculoskeletal ROS (+)   Abdominal Normal abdominal exam  (+)   Peds  Hematology  (+) anemia ,   Anesthesia Other Findings   Reproductive/Obstetrics                            Anesthesia Physical Anesthesia Plan  ASA: III  Anesthesia Plan: Spinal   Post-op Pain Management:    Induction:   Airway Management Planned: Natural Airway and Nasal Cannula  Additional Equipment:   Intra-op Plan:   Post-operative Plan:   Informed Consent: I have reviewed the patients History and Physical, chart, labs and discussed the procedure including the risks, benefits and alternatives for the proposed anesthesia with the patient or authorized representative who has indicated his/her understanding and acceptance.     Plan Discussed with: CRNA  Anesthesia Plan Comments:         Anesthesia Quick Evaluation

## 2015-07-24 NOTE — Progress Notes (Addendum)
Telephone order received for Cipro IV 400 mg BID after stool specimen is collected. If pt does not have bowel movement, start IV antibiotic at 0000. (No BM at this time). Order passed on the oncoming night shift nurse.

## 2015-07-24 NOTE — Progress Notes (Signed)
Patient was due to have a right hip hemiarthroplasty today. She was brought to the operating room. She underwent spinal anesthetic. While positioning the patient on her side, the patient was found to be having voluminous diarrhea.  Despite multiple efforts to clean the patient the diarrhea continued. Obviously there was concern about the diarrhea causing contamination of her surgical wound. I spoke with Dr. Mechele CollinElliott, from gastroenterology, who agreed with canceling the case. He recommended sending AGI PCR test on the stool to evaluate for infectious diarrhea. I spoke with anesthesia and we agreed to cancel the case despite the patient received a spinal anesthetic. She was brought to the PACU in stable condition. I spoke with the patient's family and explained the situation and they were in agreement with the plan and thanked me for exercising caution before proceeding with surgery.    [name], Neizan Debruhl , MD 07/24/2015, 3:28 PM

## 2015-07-24 NOTE — Progress Notes (Signed)
PT Cancellation Note  Patient Details Name: [name] MRN: [id number] DOB: 05/19/1924   Cancelled Treatment:    Reason Eval/Treat Not Completed: Other (comment). Pt with pending SX this date for hip fracture. Will complete current orders at this time. Please re-consult s/p Sx with WB status.   Quandra Fedorchak 07/24/2015, 10:37 AM Elizabeth PalauStephanie Treavon Castilleja, PT, DPT [phone]

## 2015-07-24 NOTE — Anesthesia Procedure Notes (Signed)
Spinal Patient location during procedure: OR Start time: 07/24/2015 2:25 PM End time: 07/24/2015 2:32 PM Reason for block: at surgeon's request Staffing Anesthesiologist: Elijio MilesVAN STAVEREN, Joelie Schou F Performed by: anesthesiologist  Preanesthetic Checklist Completed: patient identified, site marked, surgical consent, pre-op evaluation, timeout performed, IV checked, risks and benefits discussed, monitors and equipment checked and at surgeon's request Spinal Block Patient position: right lateral decubitus Prep: Betadine Patient monitoring: heart rate, continuous pulse ox and blood pressure Approach: midline Location: L2-3 Injection technique: single-shot Needle Needle type: Quincke  Needle gauge: 25 G Needle length: 9 cm Needle insertion depth: 5 cm Assessment Sensory level: T6

## 2015-07-24 NOTE — Progress Notes (Signed)
Prime doc pg waiting on call back.

## 2015-07-24 NOTE — Progress Notes (Signed)
Spoke with Dr. Sheryle Hailiamond about potassium of 3.1. Md to place orders.

## 2015-07-25 ENCOUNTER — Inpatient Hospital Stay: Payer: Medicare Other

## 2015-07-25 LAB — BASIC METABOLIC PANEL
Anion gap: 4 — ABNORMAL LOW (ref 5–15)
BUN: 17 mg/dL (ref 6–20)
CALCIUM: 8.3 mg/dL — AB (ref 8.9–10.3)
CHLORIDE: 110 mmol/L (ref 101–111)
CO2: 26 mmol/L (ref 22–32)
CREATININE: 0.7 mg/dL (ref 0.44–1.00)
Glucose, Bld: 116 mg/dL — ABNORMAL HIGH (ref 65–99)
Potassium: 3.5 mmol/L (ref 3.5–5.1)
SODIUM: 140 mmol/L (ref 135–145)

## 2015-07-25 LAB — CBC
HCT: 31.9 % — ABNORMAL LOW (ref 35.0–47.0)
Hemoglobin: 10.7 g/dL — ABNORMAL LOW (ref 12.0–16.0)
MCH: 28.2 pg (ref 26.0–34.0)
MCHC: 33.6 g/dL (ref 32.0–36.0)
MCV: 84 fL (ref 80.0–100.0)
PLATELETS: 167 10*3/uL (ref 150–440)
RBC: 3.8 MIL/uL (ref 3.80–5.20)
RDW: 15.6 % — AB (ref 11.5–14.5)
WBC: 9.3 10*3/uL (ref 3.6–11.0)

## 2015-07-25 MED ORDER — LOPERAMIDE HCL 2 MG PO CAPS: 4.0000 mg | ORAL_CAPSULE | Freq: Once | ORAL | Status: DC

## 2015-07-25 MED ORDER — HEPARIN SODIUM (PORCINE) 5000 UNIT/ML IJ SOLN
5000.0000 [IU] | Freq: Once | INTRAMUSCULAR | Status: AC
  Administered 2015-07-25: 5000 [IU] via SUBCUTANEOUS
  Filled 2015-07-25: qty 1

## 2015-07-25 MED ORDER — CIPROFLOXACIN IN D5W 400 MG/200ML IV SOLN
400.0000 mg | Freq: Once | INTRAVENOUS | Status: DC
  Administered 2015-07-25: 400 mg via INTRAVENOUS

## 2015-07-25 MED ORDER — CIPROFLOXACIN IN D5W 400 MG/200ML IV SOLN
400.0000 mg | Freq: Two times a day (BID) | INTRAVENOUS | Status: DC
Start: 2015-07-25 — End: 2015-07-26
  Administered 2015-07-25 (×2): 400 mg via INTRAVENOUS
  Filled 2015-07-25 (×6): qty 200

## 2015-07-25 MED ORDER — LOPERAMIDE HCL 2 MG PO CAPS
4.0000 mg | ORAL_CAPSULE | ORAL | Status: DC | PRN
  Administered 2015-07-25 – 2015-07-26 (×2): 4 mg via ORAL
  Filled 2015-07-25 (×3): qty 2

## 2015-07-25 NOTE — Progress Notes (Signed)
  Subjective:  Patient is seen in hospital bed. Her family is at the bedside. Patient reports pain as mild.  There is dependent no further diarrhea or stool. Cultures have not been sent however patient was started on Cipro and Flagyl by GI. Dr. Mechele CollinElliott has said the patient could go to surgery. Patient's diarrhea yesterday was likely secondary to IBS.  Objective:   VITALS:   Filed Vitals:   07/24/15 1741 07/24/15 1930 07/25/15 0509 07/25/15 0758  BP: 102/54 130/56 124/80 128/62  Pulse: 100 100 81 88  Temp: 99.1 F (37.3 C) 101.2 F (38.4 C) 97.6 F (36.4 C) 96.6 F (35.9 C)  TempSrc: Oral Oral Axillary Oral  Resp:  18 18 18   Height:      Weight:      SpO2: 95% 95% 95% 94%    PHYSICAL EXAM:  Right lower extremity: Patient remains neurovascularly intact. She can flex and extend her toes. She has palpable pedal pulses and intact sensation light touch.  LABS  Results for orders placed or performed during the hospital encounter of 07/21/15 (from the past 24 hour(s))  Basic metabolic panel     Status: Abnormal   Collection Time: 07/25/15  3:45 AM  Result Value Ref Range   Sodium 140 135 - 145 mmol/L   Potassium 3.5 3.5 - 5.1 mmol/L   Chloride 110 101 - 111 mmol/L   CO2 26 22 - 32 mmol/L   Glucose, Bld 116 (H) 65 - 99 mg/dL   BUN 17 6 - 20 mg/dL   Creatinine, Ser 2.720.70 0.44 - 1.00 mg/dL   Calcium 8.3 (L) 8.9 - 10.3 mg/dL   GFR calc non Af Amer >60 >60 mL/min   GFR calc Af Amer >60 >60 mL/min   Anion gap 4 (L) 5 - 15  CBC     Status: Abnormal   Collection Time: 07/25/15  3:45 AM  Result Value Ref Range   WBC 9.3 3.6 - 11.0 K/uL   RBC 3.80 3.80 - 5.20 MIL/uL   Hemoglobin 10.7 (L) 12.0 - 16.0 g/dL   HCT 53.631.9 (L) 64.435.0 - 03.447.0 %   MCV 84.0 80.0 - 100.0 fL   MCH 28.2 26.0 - 34.0 pg   MCHC 33.6 32.0 - 36.0 g/dL   RDW 74.215.6 (H) 59.511.5 - 63.814.5 %   Platelets 167 150 - 440 K/uL    Dg Chest 1 View  07/25/2015  CLINICAL DATA:  Fever EXAM: CHEST 1 VIEW COMPARISON:  07/21/2015 FINDINGS:  Stable mild cardiac enlargement. Vascular pattern normal. Opacity retrocardiac left lower lobe. Right lung clear. Incidental aortic calcific atherosclerosis. IMPRESSION: Left lower lobe infiltrate. Incidental aortic calcific atherosclerosis. Electronically Signed   By: Esperanza Heiraymond  Rubner M.D.   On: 07/25/2015 10:26    Assessment/Plan: 1 Day Post-Op   Active Problems:   Closed right hip fracture [facility](HCC)   Hip fracture (HCC)   Patient is on the surgical schedule for this evening. She will receive Imodium 3 hours prior to surgery per Dr. Mechele CollinElliott. Continue Flagyl and Cipro as ordered. Patient is nothing by mouth.   Juanell FairlyKRASINSKI, Neilson Oehlert , MD 07/25/2015, 12:52 PM

## 2015-07-25 NOTE — Plan of Care (Signed)
Ortho called to inform, D/T unexpected emergency case, pt surgery will have to be rescheduled for 6/21 at 1000.  Family informed.  Pt will also need Imodium given at 0700 6/21 to prep for surgery and this info will be shared in shift report.

## 2015-07-25 NOTE — Plan of Care (Signed)
Dr Martha ClanKrasinski rounded and states pt set to go to OR at 1700 today.  Instructed to give Imodium at 1400. Family informed.

## 2015-07-25 NOTE — Consult Note (Signed)
GI Inpatient Consult Note  Reason for Consult:diarrhea  Attending Requesting Consult:  History of Present Illness: [name] is a 82 y.o. female with a hx of diarrhea, thought to be IBS.  She had a very large amount of sudden watery stools while on table getting ready to have a his replacement of her right hip.  This event caused significant and appropriate concern that the patient would possibly have infectious consequenses if the surgery was carried out so it was cancelled and I was consulted.  There was concern about acute infectious diarrhea and possible C.diff also.  She currently denies abd pain, no vomiting, no bleeding.  Past Medical History:  Past Medical History  Diagnosis Date  . Vaginal bleeding   . Urethra, polyp   . Blood in stool   . Hematuria   . IBS (irritable bowel syndrome)   . Eczema   . Insomnia   . Osteopenia   . Anterolisthesis   . Dementia   . LVH (left ventricular hypertrophy) due to hypertensive disease   . Hypertension     Problem List: Patient Active Problem List   Diagnosis Date Noted  . Closed right hip fracture (HCC) 07/22/2015  . Hip fracture (HCC) 07/22/2015  . Acute pulmonary embolism (HCC) 02/09/2015  . Malnutrition of moderate degree 02/07/2015  . Hypokalemia 02/05/2015  . SPL (spondylolisthesis) 07/28/2014  . Osteopenia 07/28/2014  . Poor balance 07/28/2014  . Adynamia 07/28/2014  . Hypertensive left ventricular hypertrophy 12/21/2013  . Disorder of rotator cuff 07/09/2013  . Dementia 01/25/2013  . Regurgitation, of nonorganic origin, of food with reswallowing 01/25/2013  . Blood pressure elevated 12/28/2012  . Amnesia 11/19/2010  . Dermatitis, eczematoid 08/20/2010  . Difficulty hearing 08/20/2010  . Adaptive colitis 08/20/2010  . Cannot sleep 08/20/2010  . Affective disorder (HCC) 08/20/2010    Past Surgical History: History reviewed. No pertinent past surgical history.  Allergies: Allergies  Allergen Reactions  .  Azithromycin Itching  . Bactrim [Sulfamethoxazole-Trimethoprim]   . Other Nausea And Vomiting  . Penicillins Swelling    Has patient had a PCN reaction causing immediate rash, facial/tongue/throat swelling, SOB or lightheadedness with hypotension: Yes Has patient had a PCN reaction causing severe rash involving mucus membranes or skin necrosis: No Has patient had a PCN reaction that required hospitalization No Has patient had a PCN reaction occurring within the last 10 years: Yes If all of the above answers are "NO", then may proceed with Cephalosporin use.  . Sulfa Antibiotics   . Vibramycin [Doxycycline Calcium] Other (See Comments)    Reaction : Unknown    Home Medications: Prescriptions prior to admission  Medication Sig Dispense Refill Last Dose  . apixaban (ELIQUIS) 5 MG TABS tablet Take 1 tablet (5 mg total) by mouth 2 (two) times daily. 60 tablet 11   . aspirin 81 MG chewable tablet Chew by mouth.   02/05/2015 at Unknown time  . cyanocobalamin (,VITAMIN B-12,) 1000 MCG/ML injection Inject 1,000 mcg into the muscle every 30 (thirty) days.   07/20/2015  . dicyclomine (BENTYL) 20 MG tablet Take 0.5 mg by mouth 3 (three) times daily as needed for spasms.    02/05/2015 at Unknown time  . folic acid (FOLVITE) 1 MG tablet Take 1 mg by mouth daily.    02/05/2015 at Unknown time  . Iron-Vitamins (GERITOL COMPLETE) TABS Take 1 tablet by mouth daily.    02/05/2015 at Unknown time  . ketoconazole (NIZORAL) 2 % cream Apply 1 application topically daily.  02/05/2015 at Unknown time  . Lifitegrast (XIIDRA) 5 % SOLN Place 1 drop into both eyes 2 (two) times daily.     . Magnesium 250 MG TABS Take 1 tablet by mouth daily.   02/05/2015 at Unknown time  . memantine (NAMENDA) 10 MG tablet Take 5 mg by mouth 2 (two) times daily.    02/05/2015 at Unknown time  . PARoxetine (PAXIL) 10 MG tablet Take 10 mg by mouth daily.    02/05/2015 at Unknown time  . QUEtiapine (SEROQUEL) 25 MG tablet Take 25 mg by mouth at bedtime.      Marland Kitchen. apixaban (ELIQUIS) 5 MG TABS tablet Take 2 tablets (10 mg total) by mouth 2 (two) times daily. 12 tablet 0   . levofloxacin (LEVAQUIN) 250 MG tablet Take 1 tablet (250 mg total) by mouth daily. (Patient not taking: Reported on 07/22/2015) 2 tablet 0 Completed Course at Unknown time  . mometasone-formoterol (DULERA) 100-5 MCG/ACT AERO Inhale 2 puffs into the lungs 2 (two) times daily. (Patient not taking: Reported on 07/22/2015) 1 Inhaler 0 Not Taking at Unknown time  . QUEtiapine (SEROQUEL) 25 MG tablet Take 25 mg by mouth at bedtime.    02/04/2015 at Unknown time   Home medication reconciliation was completed with the patient.   Scheduled Inpatient Medications:   . ciprofloxacin  400 mg Intravenous BID  . clindamycin (CLEOCIN) IV  600 mg Intravenous Once  . enoxaparin (LOVENOX) injection  40 mg Subcutaneous Q24H  . folic acid  1 mg Oral Daily  . ketoconazole  1 application Topical Daily  . Lifitegrast  1 drop Both Eyes BID  . magnesium oxide  200 mg Oral Daily  . memantine  5 mg Oral BID  . metronidazole  500 mg Intravenous Q8H  . multivitamin with minerals  1 tablet Oral Daily  . PARoxetine  10 mg Oral Daily  . QUEtiapine  25 mg Oral QHS  . saccharomyces boulardii  250 mg Oral BID    Continuous Inpatient Infusions:     PRN Inpatient Medications:  acetaminophen **OR** acetaminophen, dicyclomine, hydrALAZINE, HYDROcodone-acetaminophen, ipratropium-albuterol, loperamide, methocarbamol (ROBAXIN)  IV, morphine injection, ondansetron **OR** ondansetron (ZOFRAN) IV, senna-docusate  Family History: family history is not on file.  The patient's family history is negative for inflammatory bowel disorders, GI malignancy, or solid organ transplantation.  Social History:   reports that she has never smoked. She does not have any smokeless tobacco history on file. She reports that she does not drink alcohol or use illicit drugs. The patient denies ETOH, tobacco, or drug use.   Review of  Systems: Constitutional: Weight is stable.  Eyes: No changes in vision. ENT: No oral lesions, sore throat.  GI: see HPI. No abd pain Heme/Lymph: No easy bruising.  CV: No chest pain.  GU: No hematuria.  Integumentary: No rashes.  Neuro: No headaches.  Psych: No depression/anxiety.  Endocrine: No heat/cold intolerance.  Allergic/Immunologic: No urticaria.  Resp: No cough, SOB.  Musculoskeletal: No joint swelling.    Physical Examination: BP 128/62 mmHg  Pulse 88  Temp(Src) 96.6 F (35.9 C) (Oral)  Resp 18  Ht 5' (1.524 m)  Wt 58.968 kg (130 lb)  BMI 25.39 kg/m2  SpO2 94% Gen: NAD, alert and oriented x 4 HEENT: PEERLA, EOMI, Neck: supple, no JVD or thyromegaly Chest: CTA bilaterally, no wheezes, crackles, or other adventitious sounds CV: RRR, no m/g/c/r Abd: soft, NT, ND, +BS in all four quadrants; no HSM, guarding, ridigity, or rebound tenderness.  Some  increased gurgling and a few twinklings sounds. Ext: no edema, well perfused with 2+ pulses, Right hip fracture Skin: no rash or lesions noted Lymph: no LAD  Data: Lab Results  Component Value Date   WBC 9.3 07/25/2015   HGB 10.7* 07/25/2015   HCT 31.9* 07/25/2015   MCV 84.0 07/25/2015   PLT 167 07/25/2015    Recent Labs Lab 07/23/15 0408 07/24/15 0412 07/25/15 0345  HGB 12.1 11.5* 10.7*   Lab Results  Component Value Date   NA 140 07/25/2015   K 3.5 07/25/2015   CL 110 07/25/2015   CO2 26 07/25/2015   BUN 17 07/25/2015   CREATININE 0.70 07/25/2015   Lab Results  Component Value Date   ALT 15 01/15/2012   AST 27 01/15/2012   ALKPHOS 116 01/15/2012   BILITOT 0.5 01/15/2012    Recent Labs Lab 07/23/15 0408  APTT 39*  INR 1.18   Assessment/Plan: Ms. Slatten is a 82 y.o. female with hx of IBS who had explosive diarrhea on operating table just prior to surgery.  After this even she did not have any more stools and none could be sent for analysis.  Empiric treatment with cipro and flagyl was started.   She can be treated with these for 5 days cipro and 7 days flagly if she tolerates them.  You can add any other antibiotic to her regimen that she may need for orthopedic problem that she is not allergic to.  I would give Imodium 2-3 hours before procedure.  As patients often get ileus after surgery the diarrhea problem may go away for a few days.  Recommendations:  Thank you for the consult. Please call with questions or concerns.  Lynnae Prude, MD

## 2015-07-25 NOTE — Anesthesia Postprocedure Evaluation (Signed)
Anesthesia Post Note  Patient: Colleen QualeDorothy L Mcelveen  Procedure(s) Performed: Procedure(s) (LRB): ARTHROPLASTY BIPOLAR HIP (HEMIARTHROPLASTY) (Right)  Patient location during evaluation: PACU Anesthesia Type: Spinal Level of consciousness: awake Pain management: pain level controlled Vital Signs Assessment: post-procedure vital signs reviewed and stable Respiratory status: respiratory function stable Cardiovascular status: stable Anesthetic complications: no    Last Vitals:  Filed Vitals:   07/24/15 1930 07/25/15 0509  BP: 130/56 124/80  Pulse: 100 81  Temp: 38.4 C 36.4 C  Resp: 18 18    Last Pain:  Filed Vitals:   07/25/15 0515  PainSc: Asleep                 VAN STAVEREN,Tyr Franca

## 2015-07-25 NOTE — Progress Notes (Signed)
Patient's surgery was postponed this evening due to an emergency laparotomy. Patient is rescheduled for tomorrow morning at 10:30 AM. Patient may have soft or liquid diet. She will be nothing by mouth after midnight. She should receive 1 dose of heparin [address] tonight for DVT prophylaxis. I have spoken with the patient and her family to explain the situation. They understand and are in agreement with the plan.

## 2015-07-25 NOTE — Progress Notes (Signed)
[facility] [place] [facility]   PATIENT NAME: Colleen LaughterDorothy Chavez    MR#:  [id number]  DATE OF BIRTH:  01/12/1925  SUBJECTIVE:  CHIEF COMPLAINT: Patient has no other episodes of diarrhea after the surgery was canceled, as reported by family patient has irritable bowel syndrome and gets bouts of diarrhea which is her normal. Resting comfortably during my examination  REVIEW OF SYSTEMS:   ROS: CONSTITUTIONAL: No fever, fatigue or weakness.  EYES: No blurred or double vision.  EARS, NOSE, AND THROAT: No tinnitus or ear pain.  RESPIRATORY: No cough, shortness of breath, wheezing or hemoptysis.  CARDIOVASCULAR:Reporting chest discomfort. No orthopnea, edema.  GASTROINTESTINAL: No nausea, vomiting, reporting profuse diarrhea , denies abdominal pain.  GENITOURINARY: No dysuria, hematuria.  ENDOCRINE: No polyuria, nocturia,  HEMATOLOGY: No anemia, easy bruising or bleeding SKIN: No rash or lesion. MUSCULOSKELETAL: No joint pain or arthritis.  NEUROLOGIC: No tingling, numbness, weakness.  PSYCHIATRY: No anxiety or depression.  DRUG ALLERGIES:   Allergies  Allergen Reactions  . Azithromycin Itching  . Bactrim [Sulfamethoxazole-Trimethoprim]   . Other Nausea And Vomiting  . Penicillins Swelling    Has patient had a PCN reaction causing immediate rash, facial/tongue/throat swelling, SOB or lightheadedness with hypotension: Yes Has patient had a PCN reaction causing severe rash involving mucus membranes or skin necrosis: No Has patient had a PCN reaction that required hospitalization No Has patient had a PCN reaction occurring within the last 10 years: Yes If all of the above answers are "NO", then may proceed with Cephalosporin use.  . Sulfa Antibiotics   . Vibramycin [Doxycycline Calcium] Other (See Comments)    Reaction : Unknown    VITALS:  Blood pressure 128/62, pulse 88, temperature 96.6 F (35.9 C), temperature source Oral, resp. rate 18,  height 5' (1.524 m), weight 58.968 kg (130 lb), SpO2 94 %.  PHYSICAL EXAMINATION:  GENERAL:  82 y.o.-year-old patient lying in the bed with no acute distress.  EYES: Pupils equal, round, reactive to light and accommodation. No scleral icterus.  HEENT: Head atraumatic, normocephalic. Oropharynx and nasopharynx clear.  NECK:  Supple, no jugular venous distention. No thyroid enlargement, no tenderness.  LUNGS: Normal breath sounds bilaterally, no wheezing, rales,rhonchi or crepitation. No use of accessory muscles of respiration.  CARDIOVASCULAR: S1, S2 normal. No murmurs, rubs, or gallops.  ABDOMEN: Soft, nontender, nondistended. Bowel sounds present. No organomegaly or mass.  EXTREMITIES: Right hip is abducted, and under traction No pedal edema, cyanosis, or clubbing.  NEUROLOGIC: Patient is lethargic but arousable.  PSYCHIATRIC: The patient is lethargic but arousable and falling asleep SKIN: No obvious rash, lesion, or ulcer.    LABORATORY PANEL:   CBC  Recent Labs Lab 07/25/15 0345  WBC 9.3  HGB 10.7*  HCT 31.9*  PLT 167   ------------------------------------------------------------------------------------------------------------------  Chemistries   Recent Labs Lab 07/25/15 0345  NA 140  K 3.5  CL 110  CO2 26  GLUCOSE 116*  BUN 17  CREATININE 0.70  CALCIUM 8.3*   ------------------------------------------------------------------------------------------------------------------  Cardiac Enzymes  Recent Labs Lab 07/21/15 2318  TROPONINI <0.03   ------------------------------------------------------------------------------------------------------------------  RADIOLOGY:  Dg Chest 1 View  07/25/2015  CLINICAL DATA:  Fever EXAM: CHEST 1 VIEW COMPARISON:  07/21/2015 FINDINGS: Stable mild cardiac enlargement. Vascular pattern normal. Opacity retrocardiac left lower lobe. Right lung clear. Incidental aortic calcific atherosclerosis. IMPRESSION: Left lower lobe  infiltrate. Incidental aortic calcific atherosclerosis. Electronically Signed   By: Esperanza Heiraymond  Rubner M.D.   On: 07/25/2015 10:26  EKG:   Orders placed or performed during the hospital encounter of 07/21/15  . EKG 12-Lead  . EKG 12-Lead  . ED EKG  . ED EKG    ASSESSMENT AND PLAN:   82 female admitted for a right hip fracture.  #. Right hip fracture.  Patient was previously diagnosed with PE in December 2016 and has been Eliquis 5 Mg,which is currently on hold,  Ortho -Scheduled for surgery today , diarrhea significantly improved, no other bowel movement since yesterday evening . EKG shows a normal sinus rhythm.  Continue pain management as needed  #Low-grade fever with the diarrhea  Patient has chronic history of irritable bowel syndrome and gets bouts of diarrhea intermittently which is her normal  Will get stool culture and sensitivity and check stool for occult blood  Enteric precautions  Continue Ciprofloxacin for 5 days and IV Flagyl for 7 days empirically . DC clindamycin. On  probiotics. Appreciate GI recommendations on IV fluids  Chest x-ray negative. Blood cultures and urine cultures are negative Monitor white blood count  # Dementia. Continue Namenda 10 mg daily, paroxetine 10 mg daily and Seroquel 25 mg daily at bedtime   . # History of PE. Patient diagnosed in December 2016, has been on Eliquis past 6 months,Eliquis currently on hold. No prior DVT/PE.   #. DVT prophylaxis. SCDs at this time.     All the records are reviewed and case discussed with Care Management/Social Workerr. Management plans discussed with the patient, family and they are in agreement.  CODE STATUS: FC  TOTAL TIME TAKING CARE OF THIS PATIENT: .   POSSIBLE D/C IN 4 DAYS, DEPENDING ON CLINICAL CONDITION.  Note: This dictation was prepared with Dragon dictation along with smaller phrase technology. Any transcriptional errors that result from this process are  unintentional.   Ramonita Lab M.D on 07/25/2015 at 4:26 PM  Between 7am to 6pm - Pager - [phone] After 6pm go to [email or web address] - password [email or web address] [facility]  [place] [facility]  Office  [phone]  CC: Primary care physician; Rolm Gala, MD

## 2015-07-25 NOTE — Plan of Care (Signed)
Dr Mechele CollinElliott returned page and clarified he did see pt late 6/19, but hasn't placed note yet.  Pt did begin Cipro after 0000, since still had not had BM.  Since on anbx, GI feels ok to go to OR, if given imodium 2-3 hrs prior to OR. GI unable to see pt today.  Imodium orders placed, changed to NPO and Ortho informed of GI thoughts.  Ortho will round before making decision. Family informed.

## 2015-07-25 NOTE — Plan of Care (Signed)
Pt HCG completed, consent signed, foley emptied and vitals stable for surgery.  Pt password also added ("[email or web address]").

## 2015-07-25 NOTE — Plan of Care (Addendum)
Ortho Dr. Geradine Girtontacted, per family request, to confirm if plan is for possible surgery today. Dr. Jeanene Erballed back and is not comfortable w/ going to OR till sample and results are obtained.  GI also paged and office called , per Ortho request, to inform of updates and request visit for today. Family informed.

## 2015-07-25 NOTE — Care Management Important Message (Signed)
Important Message  Patient Details  Name: Harvel QualeDorothy L Helfman MRN: [id number] Date of Birth: 04/08/1924   Medicare Important Message Given:  Yes    Collie SiadAngela Josy Peaden, RN 07/25/2015, 11:38 AM

## 2015-07-25 NOTE — Plan of Care (Signed)
Per 3rd shift report, pt still has not had BM since returning from OR attempt 6/19. Sample will be obtained when able.

## 2015-07-26 ENCOUNTER — Inpatient Hospital Stay: Payer: Medicare Other | Admitting: Certified Registered"

## 2015-07-26 ENCOUNTER — Inpatient Hospital Stay: Payer: Medicare Other

## 2015-07-26 ENCOUNTER — Encounter: Payer: Self-pay | Admitting: Anesthesiology

## 2015-07-26 ENCOUNTER — Encounter
Admission: EM | Disposition: A | Discharge status: Skilled Nursing Facility | Payer: Self-pay | Source: Home / Self Care | Attending: Internal Medicine

## 2015-07-26 HISTORY — PX: HIP ARTHROPLASTY: SHX981

## 2015-07-26 LAB — CBC
HCT: 33.3 % — ABNORMAL LOW (ref 35.0–47.0)
HEMATOCRIT: 28.7 % — AB (ref 35.0–47.0)
HEMOGLOBIN: 11.1 g/dL — AB (ref 12.0–16.0)
Hemoglobin: 9.5 g/dL — ABNORMAL LOW (ref 12.0–16.0)
MCH: 28.1 pg (ref 26.0–34.0)
MCH: 28.1 pg (ref 26.0–34.0)
MCHC: 33.3 g/dL (ref 32.0–36.0)
MCHC: 33.2 g/dL (ref 32.0–36.0)
MCV: 84.2 fL (ref 80.0–100.0)
MCV: 84.5 fL (ref 80.0–100.0)
PLATELETS: 201 10*3/uL (ref 150–440)
Platelets: 203 10*3/uL (ref 150–440)
RBC: 3.95 MIL/uL (ref 3.80–5.20)
RBC: 3.4 MIL/uL — ABNORMAL LOW (ref 3.80–5.20)
RDW: 15.6 % — ABNORMAL HIGH (ref 11.5–14.5)
RDW: 15.6 % — AB (ref 11.5–14.5)
WBC: 8.2 10*3/uL (ref 3.6–11.0)
WBC: 8.2 10*3/uL (ref 3.6–11.0)

## 2015-07-26 LAB — BASIC METABOLIC PANEL
ANION GAP: 7 (ref 5–15)
BUN: 13 mg/dL (ref 6–20)
CO2: 24 mmol/L (ref 22–32)
Calcium: 8.3 mg/dL — ABNORMAL LOW (ref 8.9–10.3)
Chloride: 108 mmol/L (ref 101–111)
Creatinine, Ser: 0.72 mg/dL (ref 0.44–1.00)
GLUCOSE: 111 mg/dL — AB (ref 65–99)
POTASSIUM: 3 mmol/L — AB (ref 3.5–5.1)
SODIUM: 139 mmol/L (ref 135–145)

## 2015-07-26 LAB — URINALYSIS COMPLETE WITH MICROSCOPIC (ARMC ONLY)
BACTERIA UA: NONE SEEN
Bilirubin Urine: NEGATIVE
GLUCOSE, UA: NEGATIVE mg/dL
Hgb urine dipstick: NEGATIVE
NITRITE: NEGATIVE
Protein, ur: NEGATIVE mg/dL
SPECIFIC GRAVITY, URINE: 1.015 (ref 1.005–1.030)
Squamous Epithelial / LPF: NONE SEEN
pH: 5 (ref 5.0–8.0)

## 2015-07-26 LAB — LACTIC ACID, PLASMA
Lactic Acid, Venous: 1.1 mmol/L (ref 0.5–2.0)
Lactic Acid, Venous: 1.6 mmol/L (ref 0.5–2.0)

## 2015-07-26 LAB — POCT I-STAT 4, (NA,K, GLUC, HGB,HCT)
GLUCOSE: 106 mg/dL — AB (ref 65–99)
HCT: 32 % — ABNORMAL LOW (ref 36.0–46.0)
HEMOGLOBIN: 10.9 g/dL — AB (ref 12.0–15.0)
POTASSIUM: 3.4 mmol/L — AB (ref 3.5–5.1)
Sodium: 141 mmol/L (ref 135–145)

## 2015-07-26 LAB — MRSA PCR SCREENING: MRSA BY PCR: NEGATIVE

## 2015-07-26 LAB — GLUCOSE, CAPILLARY: GLUCOSE-CAPILLARY: 134 mg/dL — AB (ref 65–99)

## 2015-07-26 LAB — MAGNESIUM: Magnesium: 1.5 mg/dL — ABNORMAL LOW (ref 1.7–2.4)

## 2015-07-26 SURGERY — HEMIARTHROPLASTY, HIP, DIRECT ANTERIOR APPROACH, FOR FRACTURE
Anesthesia: Spinal | Site: Hip | Laterality: Right | Wound class: Clean

## 2015-07-26 MED ORDER — POTASSIUM CHLORIDE CRYS ER 20 MEQ PO TBCR
40.0000 meq | EXTENDED_RELEASE_TABLET | Freq: Once | ORAL | Status: AC
  Administered 2015-07-26: 40 meq via ORAL

## 2015-07-26 MED ORDER — SODIUM CHLORIDE 0.9 % IV SOLN
10000.0000 ug | INTRAVENOUS | Status: DC | PRN
  Administered 2015-07-26: 35 ug/min via INTRAVENOUS

## 2015-07-26 MED ORDER — POLYETHYLENE GLYCOL 3350 17 G PO PACK
17.0000 g | PACK | Freq: Every day | ORAL | Status: DC | PRN
Start: 2015-07-26 — End: 2015-07-29

## 2015-07-26 MED ORDER — OXYCODONE HCL 5 MG PO TABS
5.0000 mg | ORAL_TABLET | ORAL | Status: DC | PRN
  Administered 2015-07-28: 5 mg via ORAL
  Administered 2015-07-29: 10 mg via ORAL
  Filled 2015-07-26: qty 1
  Filled 2015-07-26: qty 2

## 2015-07-26 MED ORDER — CLINDAMYCIN PHOSPHATE 600 MG/50ML IV SOLN
INTRAVENOUS | Status: AC
  Filled 2015-07-26: qty 50

## 2015-07-26 MED ORDER — VANCOMYCIN HCL IN DEXTROSE 1-5 GM/200ML-% IV SOLN
1000.0000 mg | INTRAVENOUS | Status: DC
  Administered 2015-07-27 – 2015-07-28 (×2): 1000 mg via INTRAVENOUS
  Filled 2015-07-26 (×4): qty 200

## 2015-07-26 MED ORDER — POTASSIUM CHLORIDE CRYS ER 20 MEQ PO TBCR
40.0000 meq | EXTENDED_RELEASE_TABLET | Freq: Two times a day (BID) | ORAL | Status: DC
  Filled 2015-07-26: qty 2

## 2015-07-26 MED ORDER — ONDANSETRON HCL 4 MG PO TABS: 4.0000 mg | ORAL_TABLET | Freq: Four times a day (QID) | ORAL | Status: DC | PRN

## 2015-07-26 MED ORDER — FERROUS SULFATE 325 (65 FE) MG PO TABS
325.0000 mg | ORAL_TABLET | Freq: Three times a day (TID) | ORAL | Status: DC
  Administered 2015-07-26 – 2015-07-28 (×8): 325 mg via ORAL
  Filled 2015-07-26 (×8): qty 1

## 2015-07-26 MED ORDER — MORPHINE SULFATE (PF) 2 MG/ML IV SOLN
2.0000 mg | INTRAVENOUS | Status: DC | PRN
  Administered 2015-07-27: 2 mg via INTRAVENOUS
  Filled 2015-07-26: qty 1

## 2015-07-26 MED ORDER — BISACODYL 10 MG RE SUPP: 10.0000 mg | Freq: Every day | RECTAL | Status: DC | PRN

## 2015-07-26 MED ORDER — ACETAMINOPHEN 325 MG PO TABS
650.0000 mg | ORAL_TABLET | Freq: Four times a day (QID) | ORAL | Status: DC | PRN
Start: 2015-07-26 — End: 2015-07-29
  Administered 2015-07-26 – 2015-07-28 (×4): 650 mg via ORAL
  Filled 2015-07-26 (×4): qty 2

## 2015-07-26 MED ORDER — SODIUM CHLORIDE 0.9 % IV SOLN
INTRAVENOUS | Status: DC | PRN
  Administered 2015-07-26 (×2): via INTRAVENOUS

## 2015-07-26 MED ORDER — ONDANSETRON HCL 4 MG/2ML IJ SOLN
INTRAMUSCULAR | Status: DC | PRN
  Administered 2015-07-26: 4 mg via INTRAVENOUS

## 2015-07-26 MED ORDER — SODIUM CHLORIDE 0.9 % IV BOLUS (SEPSIS)
1000.0000 mL | Freq: Once | INTRAVENOUS | Status: AC
  Administered 2015-07-26: 1000 mL via INTRAVENOUS

## 2015-07-26 MED ORDER — NEOMYCIN-POLYMYXIN B GU 40-200000 IR SOLN
Status: AC
  Filled 2015-07-26: qty 20

## 2015-07-26 MED ORDER — SENNA 8.6 MG PO TABS
1.0000 | ORAL_TABLET | Freq: Two times a day (BID) | ORAL | Status: DC
  Administered 2015-07-26 – 2015-07-29 (×4): 8.6 mg via ORAL
  Filled 2015-07-26 (×5): qty 1

## 2015-07-26 MED ORDER — FENTANYL CITRATE (PF) 100 MCG/2ML IJ SOLN: 25.0000 ug | INTRAMUSCULAR | Status: DC | PRN

## 2015-07-26 MED ORDER — SODIUM CHLORIDE 0.9 % IV SOLN
75.0000 mL/h | INTRAVENOUS | Status: DC
  Administered 2015-07-26: 75 mL/h via INTRAVENOUS

## 2015-07-26 MED ORDER — MENTHOL 3 MG MT LOZG: 1.0000 | LOZENGE | OROMUCOSAL | Status: DC | PRN

## 2015-07-26 MED ORDER — DEXTROSE 5 % IV SOLN
500.0000 mg | Freq: Four times a day (QID) | INTRAVENOUS | Status: DC | PRN
  Filled 2015-07-26: qty 5

## 2015-07-26 MED ORDER — CLINDAMYCIN PHOSPHATE 600 MG/50ML IV SOLN
600.0000 mg | Freq: Four times a day (QID) | INTRAVENOUS | Status: AC
  Administered 2015-07-26 (×2): 600 mg via INTRAVENOUS
  Filled 2015-07-26 (×2): qty 50

## 2015-07-26 MED ORDER — PROPOFOL 500 MG/50ML IV EMUL
INTRAVENOUS | Status: DC | PRN
  Administered 2015-07-26: 25 ug/kg/min via INTRAVENOUS

## 2015-07-26 MED ORDER — ONDANSETRON HCL 4 MG/2ML IJ SOLN: 4.0000 mg | Freq: Four times a day (QID) | INTRAMUSCULAR | Status: DC | PRN

## 2015-07-26 MED ORDER — MAGNESIUM CITRATE PO SOLN: 1.0000 | Freq: Once | ORAL | Status: DC | PRN

## 2015-07-26 MED ORDER — ACETAMINOPHEN 650 MG RE SUPP: 650.0000 mg | Freq: Four times a day (QID) | RECTAL | Status: DC | PRN

## 2015-07-26 MED ORDER — DEXTROSE 5 % IV SOLN
1.0000 g | Freq: Two times a day (BID) | INTRAVENOUS | Status: DC
  Administered 2015-07-26 – 2015-07-29 (×6): 1 g via INTRAVENOUS
  Filled 2015-07-26 (×8): qty 1

## 2015-07-26 MED ORDER — PHENOL 1.4 % MT LIQD: 1.0000 | OROMUCOSAL | Status: DC | PRN

## 2015-07-26 MED ORDER — PHENYLEPHRINE HCL 10 MG/ML IJ SOLN
INTRAMUSCULAR | Status: DC | PRN
  Administered 2015-07-26 (×2): 100 ug via INTRAVENOUS
  Administered 2015-07-26: 200 ug via INTRAVENOUS
  Administered 2015-07-26 (×2): 100 ug via INTRAVENOUS

## 2015-07-26 MED ORDER — NEOMYCIN-POLYMYXIN B GU 40-200000 IR SOLN
Status: DC | PRN
  Administered 2015-07-26: 16 mL

## 2015-07-26 MED ORDER — KETAMINE HCL 50 MG/ML IJ SOLN
INTRAMUSCULAR | Status: DC | PRN
  Administered 2015-07-26 (×3): 5 mg via INTRAMUSCULAR
  Administered 2015-07-26: 15 mg via INTRAMUSCULAR

## 2015-07-26 MED ORDER — PROPOFOL 10 MG/ML IV BOLUS
INTRAVENOUS | Status: DC | PRN
  Administered 2015-07-26: 20 mg via INTRAVENOUS

## 2015-07-26 MED ORDER — METHOCARBAMOL 500 MG PO TABS
500.0000 mg | ORAL_TABLET | Freq: Four times a day (QID) | ORAL | Status: DC | PRN
  Filled 2015-07-26: qty 1

## 2015-07-26 MED ORDER — ASPIRIN EC 81 MG PO TBEC: 81.0000 mg | DELAYED_RELEASE_TABLET | Freq: Once | ORAL | Status: DC

## 2015-07-26 MED ORDER — VANCOMYCIN HCL IN DEXTROSE 1-5 GM/200ML-% IV SOLN
1000.0000 mg | Freq: Once | INTRAVENOUS | Status: AC
  Administered 2015-07-26: 1000 mg via INTRAVENOUS
  Filled 2015-07-26: qty 200

## 2015-07-26 MED ORDER — APIXABAN 5 MG PO TABS
5.0000 mg | ORAL_TABLET | Freq: Two times a day (BID) | ORAL | Status: DC
  Administered 2015-07-27 – 2015-07-29 (×5): 5 mg via ORAL
  Filled 2015-07-26 (×5): qty 1

## 2015-07-26 MED ORDER — ONDANSETRON HCL 4 MG/2ML IJ SOLN: 4.0000 mg | Freq: Once | INTRAMUSCULAR | Status: DC | PRN

## 2015-07-26 MED ORDER — SODIUM CHLORIDE 0.9 % IV SOLN
INTRAVENOUS | Status: DC
  Administered 2015-07-27 – 2015-07-28 (×3): via INTRAVENOUS

## 2015-07-26 SURGICAL SUPPLY — 49 items
BLADE SAGITTAL WIDE XTHICK NO (BLADE) ×3 IMPLANT
BLADE SURG SZ10 CARB STEEL (BLADE) ×3 IMPLANT
CANISTER SUCT 1200ML W/VALVE (MISCELLANEOUS) ×3 IMPLANT
CANISTER SUCT 3000ML PPV (MISCELLANEOUS) ×6 IMPLANT
CAPT HIP HEMI 1 ×3 IMPLANT
DRAPE IMP U-DRAPE 54X76 (DRAPES) ×3 IMPLANT
DRAPE INCISE IOBAN 66X60 STRL (DRAPES) ×3 IMPLANT
DRAPE SHEET LG 3/4 BI-LAMINATE (DRAPES) ×6 IMPLANT
DRAPE SURG 17X11 SM STRL (DRAPES) ×3 IMPLANT
DRAPE TABLE BACK 80X90 (DRAPES) IMPLANT
DRSG OPSITE POSTOP 4X10 (GAUZE/BANDAGES/DRESSINGS) ×3 IMPLANT
DURAPREP 26ML APPLICATOR (WOUND CARE) ×9 IMPLANT
ELECT BLADE 6.5 EXT (BLADE) ×3 IMPLANT
ELECT CAUTERY BLADE 6.4 (BLADE) ×3 IMPLANT
ELECT REM PT RETURN 9FT ADLT (ELECTROSURGICAL) ×3
ELECTRODE REM PT RTRN 9FT ADLT (ELECTROSURGICAL) ×1 IMPLANT
GAUZE PETRO XEROFOAM 1X8 (MISCELLANEOUS) ×6 IMPLANT
GAUZE SPONGE 4X4 12PLY STRL (GAUZE/BANDAGES/DRESSINGS) ×3 IMPLANT
GLOVE BIOGEL PI IND STRL 9 (GLOVE) ×1 IMPLANT
GLOVE BIOGEL PI INDICATOR 9 (GLOVE) ×2
GLOVE SURG 9.0 ORTHO LTXF (GLOVE) ×3 IMPLANT
GOWN STRL REUS TWL 2XL XL LVL4 (GOWN DISPOSABLE) ×3 IMPLANT
GOWN STRL REUS W/ TWL LRG LVL3 (GOWN DISPOSABLE) ×1 IMPLANT
GOWN STRL REUS W/TWL LRG LVL3 (GOWN DISPOSABLE) ×2
HANDPIECE SUCTION TUBG SURGILV (MISCELLANEOUS) ×3 IMPLANT
HEMOVAC 400ML (MISCELLANEOUS)
HIP CAPITATED HEMI 1 ×1 IMPLANT
KIT DRAIN HEMOVAC JP 7FR 400ML (MISCELLANEOUS) IMPLANT
KIT RM TURNOVER STRD PROC AR (KITS) ×3 IMPLANT
NDL SAFETY 18GX1.5 (NEEDLE) ×3 IMPLANT
NEEDLE FILTER BLUNT 18X 1/2SAF (NEEDLE) ×2
NEEDLE FILTER BLUNT 18X1 1/2 (NEEDLE) ×1 IMPLANT
NEEDLE MAYO CATGUT SZ4 (NEEDLE) ×3 IMPLANT
NS IRRIG 1000ML POUR BTL (IV SOLUTION) ×3 IMPLANT
PACK HIP PROSTHESIS (MISCELLANEOUS) ×3 IMPLANT
PILLOW ABDUC SM (MISCELLANEOUS) ×3 IMPLANT
RETRIEVER SUT HEWSON (MISCELLANEOUS) IMPLANT
SOL .9 NS 3000ML IRR  AL (IV SOLUTION) ×2
SOL .9 NS 3000ML IRR UROMATIC (IV SOLUTION) ×1 IMPLANT
STAPLER SKIN PROX 35W (STAPLE) ×3 IMPLANT
SUT MNCRL 3 0 RB1 (SUTURE) ×1 IMPLANT
SUT MONOCRYL 3 0 RB1 (SUTURE) ×2
SUT TICRON 2-0 30IN 311381 (SUTURE) ×12 IMPLANT
SUT VIC AB 0 CT1 36 (SUTURE) ×3 IMPLANT
SUT VIC AB 2-0 CT1 27 (SUTURE) ×4
SUT VIC AB 2-0 CT1 TAPERPNT 27 (SUTURE) ×2 IMPLANT
SUT VIC AB 2-0 CT2 27 (SUTURE) ×6 IMPLANT
SYRINGE 10CC LL (SYRINGE) ×3 IMPLANT
TAPE MICROFOAM 4IN (TAPE) ×3 IMPLANT

## 2015-07-26 NOTE — Progress Notes (Signed)
Phlebotomist at bedside drawing STAT labs.  Prime doc paged again.  Sterile UA sent from foley catheter.

## 2015-07-26 NOTE — Progress Notes (Signed)
Pt's Blood pressure continued to stay low- so ordered one litre bolus and transfer to Stepdown unit.  * Assessment and plan.  - Septic shock   IV bolus- now BP responding to it.   Xray - Possible pneumonia.   UA- positive.   Broad spectrum ABx.    [place].

## 2015-07-26 NOTE — Progress Notes (Signed)
[facility]   PATIENT NAME: Colleen LaughterDorothy Chavez    MR#:  [id number]  DATE OF BIRTH:  03/06/1924  SUBJECTIVE:  CHIEF COMPLAINT: Patient has no other episodes of diarrhea. Patient was seen and examined after surgery today Resting comfortably during my examination. Family members at bedside  REVIEW OF SYSTEMS:   ROS: CONSTITUTIONAL: No fever, fatigue or weakness.  EYES: No blurred or double vision.  EARS, NOSE, AND THROAT: No tinnitus or ear pain.  RESPIRATORY: No cough, shortness of breath, wheezing or hemoptysis.  CARDIOVASCULAR:Reporting chest discomfort. No orthopnea, edema.  GASTROINTESTINAL: No nausea, vomiting, reporting profuse diarrhea , denies abdominal pain.  GENITOURINARY: No dysuria, hematuria.  ENDOCRINE: No polyuria, nocturia,  HEMATOLOGY: No anemia, easy bruising or bleeding SKIN: No rash or lesion. MUSCULOSKELETAL:Right hip surgery  NEUROLOGIC: No tingling, numbness, weakness.  PSYCHIATRY: No anxiety or depression.  DRUG ALLERGIES:   Allergies  Allergen Reactions  . Azithromycin Itching  . Bactrim [Sulfamethoxazole-Trimethoprim]   . Other Nausea And Vomiting  . Penicillins Swelling    Has patient had a PCN reaction causing immediate rash, facial/tongue/throat swelling, SOB or lightheadedness with hypotension: Yes Has patient had a PCN reaction causing severe rash involving mucus membranes or skin necrosis: No Has patient had a PCN reaction that required hospitalization No Has patient had a PCN reaction occurring within the last 10 years: Yes If all of the above answers are "NO", then may proceed with Cephalosporin use.  . Sulfa Antibiotics   . Vibramycin [Doxycycline Calcium] Other (See Comments)    Reaction : Unknown    VITALS:  Blood pressure 147/67, pulse 100, temperature 99 F (37.2 C), temperature source Oral, resp. rate 19, height 5' (1.524 m), weight 58.968 kg (130 lb), SpO2 96 %.  PHYSICAL  EXAMINATION:  GENERAL:  82 y.o.-year-old patient lying in the bed with no acute distress.  EYES: Pupils equal, round, reactive to light and accommodation. No scleral icterus.  HEENT: Head atraumatic, normocephalic. Oropharynx and nasopharynx clear.  NECK:  Supple, no jugular venous distention. No thyroid enlargement, no tenderness.  LUNGS: Normal breath sounds bilaterally, no wheezing, rales,rhonchi or crepitation. No use of accessory muscles of respiration.  CARDIOVASCULAR: S1, S2 normal. No murmurs, rubs, or gallops.  ABDOMEN: Soft, nontender, nondistended. Bowel sounds present. No organomegaly or mass.  EXTREMITIES: Status post right hip surgery NEUROLOGIC: Patient is lethargic but arousable.  PSYCHIATRIC: The patient is lethargic but arousable and falling asleep SKIN: No obvious rash, lesion, or ulcer.    LABORATORY PANEL:   CBC  Recent Labs Lab 07/26/15 0651 07/26/15 0926  WBC 8.2  --   HGB 11.1* 10.9*  HCT 33.3* 32.0*  PLT 201  --    ------------------------------------------------------------------------------------------------------------------  Chemistries   Recent Labs Lab 07/26/15 0651 07/26/15 0926  NA 139 141  K 3.0* 3.4*  CL 108  --   CO2 24  --   GLUCOSE 111* 106*  BUN 13  --   CREATININE 0.72  --   CALCIUM 8.3*  --   MG 1.5*  --    ------------------------------------------------------------------------------------------------------------------  Cardiac Enzymes  Recent Labs Lab 07/21/15 2318  TROPONINI <0.03   ------------------------------------------------------------------------------------------------------------------  RADIOLOGY:  Dg Chest 1 View  07/25/2015  CLINICAL DATA:  Fever EXAM: CHEST 1 VIEW COMPARISON:  07/21/2015 FINDINGS: Stable mild cardiac enlargement. Vascular pattern normal. Opacity retrocardiac left lower lobe. Right lung clear. Incidental aortic calcific atherosclerosis. IMPRESSION: Left lower lobe infiltrate. Incidental  aortic calcific atherosclerosis. Electronically Signed  By: Esperanza Heir M.D.   On: 07/25/2015 10:26   Dg Hip Port Unilat With Pelvis 1v Right  07/26/2015  CLINICAL DATA:  Hip fracture EXAM: DG HIP (WITH OR WITHOUT PELVIS) 1V PORT RIGHT COMPARISON:  07/21/2015 FINDINGS: Two views of the right hip submitted including frontal view of the pelvis. The patient is status post right femoral neck fracture repair. There is a prosthesis in proximal right femur with anatomic alignment. IMPRESSION: Right hip femoral prosthesis with anatomic alignment. Electronically Signed   By: Natasha Mead M.D.   On: 07/26/2015 13:37    EKG:   Orders placed or performed during the hospital encounter of 07/21/15  . EKG 12-Lead  . EKG 12-Lead  . ED EKG  . ED EKG    ASSESSMENT AND PLAN:   18 female admitted for a right hip fracture.  #. Right hip fracture. Postop day #0  Postop care by orthopedics  Patient was previously diagnosed with PE in December 2016 and has been Eliquis 5 Mg,which is currently on hold,  Ortho -Scheduled for surgery today , diarrhea significantly improved, no other bowel movement since yesterday evening . EKG shows a normal sinus rhythm.  Continue pain management as needed  #Low-grade fever with the diarrhea -resolved  Patient has chronic history of irritable bowel syndrome and gets bouts of diarrhea intermittently which is her normal  Will get stool culture and sensitivity and check stool for occult blood -stool sample was never collected as the patient was not having any  BMs for 2 days  Enteric precautions  discontinued  Continue Ciprofloxacin for 5 days and IV Flagyl for 7 days empirically . DC clindamycin. On  probiotics. Appreciate GI recommendations on IV fluids  Chest x-ray negative. Blood cultures and urine cultures are negative Monitor white blood count  # Dementia. Continue Namenda 10 mg daily, paroxetine 10 mg daily and Seroquel 25 mg daily at bedtime   . # History of PE.  Patient diagnosed in December 2016, has been on Eliquis past 6 months,Eliquis currently on hold. No prior DVT/PE.   #. DVT prophylaxis. SCDs at this time.     All the records are reviewed and case discussed with Care Management/Social Workerr. Management plans discussed with the patient, family and they are in agreement.  CODE STATUS: FC  TOTAL TIME TAKING CARE OF THIS PATIENT: .   POSSIBLE D/C IN 1-2 DAYS, DEPENDING ON CLINICAL CONDITION.  Note: This dictation was prepared with Dragon dictation along with smaller phrase technology. Any transcriptional errors that result from this process are unintentional.   Ramonita Lab M.D on 07/26/2015 at 4:33 PM  Between 7am to 6pm - Pager - [phone] After 6pm go to [email or web address] - password [email or web address] [facility]  [facility]  Office  [phone]  CC: Primary care physician; Rolm Gala, MD

## 2015-07-26 NOTE — Plan of Care (Addendum)
RN did noted. [id number] , while taking a sip of with pain meds, pt coughed quite heavily and stated it hurt going down.   While ortho was making rounds about 1745, pt appeared sluggish and difficult to arouse.  This was a change in immediate post op assessments (as pt was alert, talkative and able to follow commands). Vitals taken and resulted abnormal to previous recent results.   Ortho notified and Hosp also contacted. Tylenol given.

## 2015-07-26 NOTE — Transfer of Care (Signed)
Immediate Anesthesia Transfer of Care Note  Patient: [name]  Procedure(s) Performed: Procedure(s): ARTHROPLASTY BIPOLAR HIP (HEMIARTHROPLASTY) (Right)  Patient Location: PACU  Anesthesia Type:Spinal  Level of Consciousness: awake, alert  and patient cooperative  Airway & Oxygen Therapy: Patient Spontanous Breathing and Patient connected to face mask oxygen  Post-op Assessment: Report given to RN, Post -op Vital signs reviewed and stable and Patient moving all extremities X 4  Post vital signs: Reviewed and stable  Last Vitals:  Filed Vitals:   07/26/15 0736 07/26/15 0923  BP: 129/69 140/80  Pulse: 91 87  Temp: 36.7 C 37.2 C  Resp: 16 16    Last Pain:  Filed Vitals:   07/26/15 0924  PainSc: 5       Patients Stated Pain Goal: 2 (07/22/15 1418)  Complications: No apparent anesthesia complications

## 2015-07-26 NOTE — Consult Note (Signed)
When patient is successfully taking oral nourishment the antibiotics can be changed from iv to oral.  Her abd shows active bowel sounds, non tender. No further recommendations.

## 2015-07-26 NOTE — Anesthesia Preprocedure Evaluation (Addendum)
Anesthesia Evaluation  Patient identified by MRN, date of birth, ID band Patient awake    Airway Mallampati: II       Dental  (+) Edentulous Upper, Edentulous Lower   Pulmonary neg pulmonary ROS,    Pulmonary exam normal        Cardiovascular Exercise Tolerance: Poor hypertension, Pt. on medications  Rhythm:Regular     Neuro/Psych dementianegative neurological ROS     GI/Hepatic negative GI ROS, Neg liver ROS,   Endo/Other  negative endocrine ROS  Renal/GU negative Renal ROS     Musculoskeletal negative musculoskeletal ROS (+)   Abdominal Normal abdominal exam  (+)   Peds  Hematology  (+) anemia ,   Anesthesia Other Findings   Reproductive/Obstetrics                          Anesthesia Physical  Anesthesia Plan  ASA: III  Anesthesia Plan: Spinal   Post-op Pain Management:    Induction:   Airway Management Planned: Natural Airway and Nasal Cannula  Additional Equipment:   Intra-op Plan:   Post-operative Plan:   Informed Consent: I have reviewed the patients History and Physical, chart, labs and discussed the procedure including the risks, benefits and alternatives for the proposed anesthesia with the patient or authorized representative who has indicated his/her understanding and acceptance.     Plan Discussed with: CRNA  Anesthesia Plan Comments: (Last dose of eliquis was 6/16)       Anesthesia Quick Evaluation

## 2015-07-26 NOTE — Op Note (Signed)
07/21/2015 - 07/26/2015  12:40 PM  PATIENT:  [name]   MRN: [id number]  PRE-OPERATIVE DIAGNOSIS:  right hip fracture  POST-OPERATIVE DIAGNOSIS:  right hip fracture  PROCEDURE:  Procedure(s): ARTHROPLASTY BIPOLAR HIP (HEMIARTHROPLASTY)  PREOPERATIVE INDICATIONS:    [name] is an 82 y.o. female who has a diagnosis of right hip fracture and agreed with surgical management of his fracture.  The risks benefits and alternatives were discussed with the patient and their family including but not limited to the risks of  infection requiring removal of the prosthesis, bleeding requiring blood transfusion, nerve injury especially to the sciatic nerve leading to foot drop or lower extremity numbness, periprosthetic fracture, dislocation leg length discrepancy, change in lower extremity rotation persistent hip pain, loosening or failure of the components and the need for revision surgery. Medical risks include but are not limited to DVT and pulmonary embolism, myocardial infarction, stroke, pneumonia, respiratory failure and death.  OPERATIVE REPORT     SURGEON:  Thornton Park, MD    ASSISTANT:  Surgical tech     ANESTHESIA:  Spinal     COMPLICATIONS:  None.   SPECIMEN: Femoral head to pathology    COMPONENTS:  St[employer]ker Accolade femoral component size 3  with a 45 mm +0 neck adjustment sleeve.    PROCEDURE IN DETAIL:   The patient was met in the holding area and  identified.  The appropriate hip was identified and marked at the operative site after verbally confirming with the patient that this was the correct site of surgery.  The patient was then transported to the OR  and  underwent spinal anesthesia.  The patient was then placed in the lateral decubitus position with the operative side up and secured on the operating room table with a pegboard and all bony prominences were adequately padded. This included an axillary roll and additional padding around the nonoperative leg to  prevent compression to the common peroneal nerve.    The operative lower extremity was prepped and draped in a sterile fashion.  A time out was performed prior to incision to verify patient's name, date of birth, medical record number, correct site of surgery correct procedure to be performed. Was also used to verify the patient received antibiotics now appropriate instruments, implants and radiographic studies were available in the room. Once all in attendance were in agreement case began.    A posterolateral approach was utilized via sharp dissection  carried down to the subcutaneous tissue.  Bleeding vessels were coagulated using electrocautery.  The fascia lata was identified and incised along the length of the skin incision.  The gluteus maximus muscle was then split in line with its fibers. Self-retaining retractors were  inserted.  With the hip internally rotated, the short external rotators  were identified and removed from the posterior attachment from the greater trochanter. The piriformis was tagged for later repair. The capsule was identified and a T-shaped capsulotomy was performed. The capsule was tagged with #2 Tycron for later repair.  The femoral neck fracture was exposed, and the femoral head was removed using a corkscrew device. This was measured to be 45 mm in diameter. The attention was then turned to proximal femur preparation.  An oscillating saw was used to perform a proximal femoral osteotomy 1 fingerbreadth above the lesser trochanter. The trial 45 mm femoral head was placed into the acetabulum and had an excellent suction fit. The attention was then turned back to femoral preparation.    A  femoral skid and Cobra retractor were placed under the femoral neck to allow for adequate visualization. A box osteotome was used to make the initial entry into the proximal femur. A single hand reamer was used to prepare the femoral canal. A T-shaped femoral canal sounder was then used to ensure  no penetration femoral cortex had occurred during reaming. The proximal femur was then sequentially broached by hand. A size 3 femoral trial was found to have best medial to lateral canal fit. Once adequate mediolateral canal fill was achieved the trial femoral broach, neck,and head was assembled and the hip was reduced. It was found to have excellent stability, equivalent leg lengths with functional range of motion. The trial components were then removed.  I copiously irrigated the femoral canal and then impacted the real femoral prosthesis into place into the appropriate version, slightly anteverted to the normal anatomy, and I impacted the actual 45 mm Unitrax femoral component with a +0 neck adjustment sleeve into place. The hip was then reduced and taken through functional range of motion and found to have excellent stability. Leg lengths were restored.   The hip joint was copiously irrigated.   A soft tissue repair of the capsule and piriformis was performed using #2 Tycron Excellent posterior capsular repair was achieved. The fascia lata was then closed with interrupted 0 Vicryl suture. The subcutaneous tissues were closed with 2-0 Vicryl and the skin approximated with staples.   The patient was then placed supine on the operative table. Leg lengths were checked clinically and found to be equivalent. An abduction pillow was placed between the lower extremities. The patient was then transferred to a hospital bed and brought to the PACU in stable condition. I was scrubbed and present the entire case and all sharp and instrument counts were correct at the conclusion of the case. I spoke with the patient's family in the postop consultation room to let the case was completed without complication patient was stable in recovery room.   Timoteo Gaul, MD Orthopedic Surgeon

## 2015-07-26 NOTE — Progress Notes (Signed)
I was called by nurse and orthopedic physician for hypo-tension and tachycardia and fever.  Examination:   Patient is lethargic but arousable and follows simple, and follows simple command.   Respiratory- no wheezing or crepitation, no distress.  * Assessment and plan  - Fever of unknown origin. Sepsis- hypotension, fever, tachycardia, hypoxia, altered mental status.   The patient's daughter is present in the room she said patient had loose stool on and off, no episodes in hospital so stool culture was never sent. But today right before going for surgery she had a large loose bowel movement.   I again ordered stool C. difficile and GI panel.   IV fluids to support blood pressure.   Send urinalysis and chest x-ray. 2.   Blood cultures and CBC stat.   IV vancomycin and Zosyn for now.  Condition is critical because of presence of sepsis and hypoxia. Patient is full code. I discussed the plan with patient's daughter who was present in the room.   Total critical care time spent 30 minutes. I will follow-up the results later on.

## 2015-07-26 NOTE — Progress Notes (Signed)
Subjective:  POST-OP CHECK:  Patient lethargic.  She can be aroused and follow commands.  Patient noted to be diaphoretic and tachycardic by RN.  Patient without complaint of significant right hip pain .   Objective:   VITALS:   Filed Vitals:   07/26/15 1600 07/26/15 1700 07/26/15 1740 07/26/15 1741  BP: 147/67 134/72 78/48   Pulse: 100 98 114 113  Temp: 99 F (37.2 C) 98.8 F (37.1 C) 100.6 F (38.1 C)   TempSrc: Oral Oral Oral   Resp: Height:      Weight:      SpO2: 96% 98% 71% 73%    PHYSICAL EXAM:  Right lower extremity:  Dressing C/D/I.  Patient can flex and extend toes.  Pedal pulses are palpable.  Intact sensation to light touch.  LABS  Results for orders placed or performed during the hospital encounter of 07/21/15 (from the past 24 hour(s))  CBC     Status: Abnormal   Collection Time: 07/26/15  6:51 AM  Result Value Ref Range   WBC 8.2 3.6 - 11.0 K/uL   RBC 3.95 3.80 - 5.20 MIL/uL   Hemoglobin 11.1 (L) 12.0 - 16.0 g/dL   HCT 16.1 (L) 09.6 - 04.5 %   MCV 84.2 80.0 - 100.0 fL   MCH 28.1 26.0 - 34.0 pg   MCHC 33.3 32.0 - 36.0 g/dL   RDW 40.9 (H) 81.1 - 91.4 %   Platelets 201 150 - 440 K/uL  Basic metabolic panel     Status: Abnormal   Collection Time: 07/26/15  6:51 AM  Result Value Ref Range   Sodium 139 135 - 145 mmol/L   Potassium 3.0 (L) 3.5 - 5.1 mmol/L   Chloride 108 101 - 111 mmol/L   CO2 24 22 - 32 mmol/L   Glucose, Bld 111 (H) 65 - 99 mg/dL   BUN 13 6 - 20 mg/dL   Creatinine, Ser 7.82 0.44 - 1.00 mg/dL   Calcium 8.3 (L) 8.9 - 10.3 mg/dL   GFR calc non Af Amer >60 >60 mL/min   GFR calc Af Amer >60 >60 mL/min   Anion gap 7 5 - 15  Magnesium     Status: Abnormal   Collection Time: 07/26/15  6:51 AM  Result Value Ref Range   Magnesium 1.5 (L) 1.7 - 2.4 mg/dL  I-STAT 4, (NA,K, GLUC, HGB,HCT)     Status: Abnormal   Collection Time: 07/26/15  9:26 AM  Result Value Ref Range   Sodium 141 135 - 145 mmol/L   Potassium 3.4 (L) 3.5 - 5.1  mmol/L   Glucose, Bld 106 (H) 65 - 99 mg/dL   HCT 95.6 (L) 21.3 - 08.6 %   Hemoglobin 10.9 (L) 12.0 - 15.0 g/dL    Dg Chest 1 View  5/78/4696  CLINICAL DATA:  Fever EXAM: CHEST 1 VIEW COMPARISON:  07/21/2015 FINDINGS: Stable mild cardiac enlargement. Vascular pattern normal. Opacity retrocardiac left lower lobe. Right lung clear. Incidental aortic calcific atherosclerosis. IMPRESSION: Left lower lobe infiltrate. Incidental aortic calcific atherosclerosis. Electronically Signed   By: Esperanza Heir M.D.   On: 07/25/2015 10:26   Dg Hip Port Unilat With Pelvis 1v Right  07/26/2015  CLINICAL DATA:  Hip fracture EXAM: DG HIP (WITH OR WITHOUT PELVIS) 1V PORT RIGHT COMPARISON:  07/21/2015 FINDINGS: Two views of the right hip submitted including frontal view of the pelvis. The patient is status post right femoral neck fracture repair. There is a  prosthesis in proximal right femur with anatomic alignment. IMPRESSION: Right hip femoral prosthesis with anatomic alignment. Electronically Signed   By: Natasha MeadLiviu  Pop M.D.   On: 07/26/2015 13:37    Assessment/Plan: Day of Surgery   Active Problems:   Closed right hip fracture (HCC)   Hip fracture (HCC)   Patient lethargic with tachycardia.  Oxygen saturations down in the 70s.  Elevated temp to 103 rectally per nurse.  Will have hospitalist evaluate.     Juanell FairlyKRASINSKI, Pollyann Roa , MD 07/26/2015, 5:55 PM

## 2015-07-26 NOTE — Progress Notes (Signed)
Spoke with MD Vachhani, 1L NS bolus over 2 hours for bp 70/35

## 2015-07-26 NOTE — Progress Notes (Signed)
Pt remains hypotensive and lethargic.  NS fluid bolus infusing.  Prime doc paged, awaiting call back.

## 2015-07-26 NOTE — Anesthesia Procedure Notes (Addendum)
Spinal  Start time: 07/26/2015 10:41 AM End time: 07/26/2015 10:46 AM Staffing Anesthesiologist: Yves DillARROLL, Joniece Smotherman Preanesthetic Checklist Completed: patient identified, site marked, surgical consent, pre-op evaluation, timeout performed, IV checked, risks and benefits discussed and monitors and equipment checked Spinal Block Patient position: sitting Prep: Betadine and site prepped and draped Patient monitoring: heart rate, cardiac monitor, continuous pulse ox and blood pressure Approach: right paramedian Location: L4-5 Injection technique: single-shot Needle Needle type: Quincke  Needle gauge: 25 G Needle length: 9 cm Needle insertion depth: 5 cm Assessment Sensory level: T8 Additional Notes Time out called.  Patient placed in sitting position after some premedication.  Back was prepped and draped in sterile fashion.  A skin wheal was made in the paramedian location of L4-L5 and a 25 G Quincke needle was used to gain access to the subarachnoid space with the return of clear, colorless CSF in all 4 Quads.  No blood or paresthesias.  Patient tolerated the procedure well. 11.5 mg of marcaine + 2mg  of tetracaine + 1: 200K epi used

## 2015-07-26 NOTE — Progress Notes (Signed)
Patient had surgery today. Clinical Child psychotherapistocial Worker (CSW) presented bed offers to patient's family and explained the [facility]. Per patient's daughter she [facility] a [facility] Darel HongJudy. Per daughter they will review bed offers and have a decision for CSW by tomorrow. CSW will continue to follow and assist as needed.   Jetta LoutBailey Morgan, LCSW [phone]

## 2015-07-26 NOTE — Progress Notes (Signed)
Pharmacy Antibiotic Note  [name] is a 82 y.o. female admitted on 07/21/2015 with sepsis.  Pharmacy has been consulted for Vancomycin and Cefepime  dosing.  Plan: Will start Cefepime 1 g IV q12 hours.   Will give Vancomycin 1 g IV x 1 and will then continue Vancomycin 1 g IV q24 hours ~14 hours post dose. Will order Vancomycin trough level prior to the 10:00 dose of Vancomycin on 6/25.   Trough level desired 15-20 mcg/ml.      Height: 5' (152.4 cm) Weight: 130 lb (58.968 kg) IBW/kg (Calculated) : 45.5  Temp (24hrs), Avg:99 F (37.2 C), Min:98.1 F (36.7 C), Max:103.3 F (39.6 C)   Recent Labs Lab 07/22/15 0414 07/23/15 0408 07/24/15 0412 07/24/15 1218 07/25/15 0345 07/26/15 0651  WBC 13.6* 12.6* 10.2  --  9.3 8.2  CREATININE  --  0.80 0.80 0.73 0.70 0.72    Estimated Creatinine Clearance: 38.3 mL/min (by C-G formula based on Cr of 0.72).    Allergies  Allergen Reactions  . Azithromycin Itching  . Bactrim [Sulfamethoxazole-Trimethoprim]   . Other Nausea And Vomiting  . Penicillins Swelling    Has patient had a PCN reaction causing immediate rash, facial/tongue/throat swelling, SOB or lightheadedness with hypotension: Yes Has patient had a PCN reaction causing severe rash involving mucus membranes or skin necrosis: No Has patient had a PCN reaction that required hospitalization No Has patient had a PCN reaction occurring within the last 10 years: Yes If all of the above answers are "NO", then may proceed with Cephalosporin use.  . Sulfa Antibiotics   . Vibramycin [Doxycycline Calcium] Other (See Comments)    Reaction : Unknown    Antimicrobials this admission:   >>    >>   Dose adjustments this admission:   Microbiology results:  BCx: pending   UCx:    Sputum:    MRSA PCR:   Thank you for allowing pharmacy to be a part of this patient's care.  Fredricka Kohrs D 07/26/2015 7:07 PM

## 2015-07-26 NOTE — Progress Notes (Signed)
Lab called for STAT blood cultures to be drawn.

## 2015-07-26 NOTE — Progress Notes (Signed)
  Subjective:  Patient seen in pre-op area.  Patient reports pain as mild.  Daughter at the bedside.  Patient has no other complaints.  Objective:   VITALS:   Filed Vitals:   07/25/15 2051 07/26/15 0538 07/26/15 0736 07/26/15 0923  BP: 150/60 142/56 129/69 140/80  Pulse: 103 91 91 87  Temp:  98.8 F (37.1 C) 98.1 F (36.7 C) 98.9 F (37.2 C)  TempSrc:  Oral Oral Tympanic  Resp: 18 19 16 16   Height:      Weight:      SpO2: 95% 94% 95% 95%    PHYSICAL EXAM:  Right lower extremity: Neurovascular intact Sensation intact distally Intact pulses distally Dorsiflexion/Plantar flexion intact Compartment soft  LABS  Results for orders placed or performed during the hospital encounter of 07/21/15 (from the past 24 hour(s))  CBC     Status: Abnormal   Collection Time: 07/26/15  6:51 AM  Result Value Ref Range   WBC 8.2 3.6 - 11.0 K/uL   RBC 3.95 3.80 - 5.20 MIL/uL   Hemoglobin 11.1 (L) 12.0 - 16.0 g/dL   HCT 16.133.3 (L) 09.635.0 - 04.547.0 %   MCV 84.2 80.0 - 100.0 fL   MCH 28.1 26.0 - 34.0 pg   MCHC 33.3 32.0 - 36.0 g/dL   RDW 40.915.6 (H) 81.111.5 - 91.414.5 %   Platelets 201 150 - 440 K/uL  Basic metabolic panel     Status: Abnormal   Collection Time: 07/26/15  6:51 AM  Result Value Ref Range   Sodium 139 135 - 145 mmol/L   Potassium 3.0 (L) 3.5 - 5.1 mmol/L   Chloride 108 101 - 111 mmol/L   CO2 24 22 - 32 mmol/L   Glucose, Bld 111 (H) 65 - 99 mg/dL   BUN 13 6 - 20 mg/dL   Creatinine, Ser 7.820.72 0.44 - 1.00 mg/dL   Calcium 8.3 (L) 8.9 - 10.3 mg/dL   GFR calc non Af Amer >60 >60 mL/min   GFR calc Af Amer >60 >60 mL/min   Anion gap 7 5 - 15  Magnesium     Status: Abnormal   Collection Time: 07/26/15  6:51 AM  Result Value Ref Range   Magnesium 1.5 (L) 1.7 - 2.4 mg/dL  I-STAT 4, (NA,K, GLUC, HGB,HCT)     Status: Abnormal   Collection Time: 07/26/15  9:26 AM  Result Value Ref Range   Sodium 141 135 - 145 mmol/L   Potassium 3.4 (L) 3.5 - 5.1 mmol/L   Glucose, Bld 106 (H) 65 - 99 mg/dL    HCT 95.632.0 (L) 21.336.0 - 46.0 %   Hemoglobin 10.9 (L) 12.0 - 15.0 g/dL    Dg Chest 1 View  0/86/57846/20/2017  CLINICAL DATA:  Fever EXAM: CHEST 1 VIEW COMPARISON:  07/21/2015 FINDINGS: Stable mild cardiac enlargement. Vascular pattern normal. Opacity retrocardiac left lower lobe. Right lung clear. Incidental aortic calcific atherosclerosis. IMPRESSION: Left lower lobe infiltrate. Incidental aortic calcific atherosclerosis. Electronically Signed   By: Esperanza Heiraymond  Rubner M.D.   On: 07/25/2015 10:26    Assessment/Plan: Day of Surgery   Active Problems:   Closed right hip fracture [facility](HCC)   Hip fracture [facility](HCC)   Patient ready for right hip hemiarthroplasty.  Off Eliquis for over 72 hours.  No IBS symptoms including diarrhea at this time.   Juanell FairlyKRASINSKI, Abimael Zeiter , MD 07/26/2015, 10:39 AM

## 2015-07-26 NOTE — Progress Notes (Signed)
Page returned by Dr Madelon LipsVacchani.  Updated on pt's status and VS.  Orders received to transfer to Step-down unit.

## 2015-07-26 NOTE — Plan of Care (Addendum)
Report given to OR. Foley emptied.  Imodium and potassium PO given per Dr. Jarrett Ablesrders.  HCG X2.  NS running at 100/hr. Consents complete and on chart. Pt has been NPO since midnight.

## 2015-07-27 LAB — BASIC METABOLIC PANEL
Anion gap: 6 (ref 5–15)
BUN: 17 mg/dL (ref 6–20)
CHLORIDE: 114 mmol/L — AB (ref 101–111)
CO2: 21 mmol/L — ABNORMAL LOW (ref 22–32)
Calcium: 7.7 mg/dL — ABNORMAL LOW (ref 8.9–10.3)
Creatinine, Ser: 0.73 mg/dL (ref 0.44–1.00)
GFR calc Af Amer: >60 mL/min (ref >60)
GLUCOSE: 122 mg/dL — AB (ref 65–99)
POTASSIUM: 3.4 mmol/L — AB (ref 3.5–5.1)
SODIUM: 141 mmol/L (ref 135–145)

## 2015-07-27 LAB — CULTURE, BLOOD (ROUTINE X 2)
Culture: NO GROWTH
Culture: NO GROWTH

## 2015-07-27 LAB — CBC
HCT: 28.4 % — ABNORMAL LOW (ref 35.0–47.0)
Hemoglobin: 9.6 g/dL — ABNORMAL LOW (ref 12.0–16.0)
MCH: 28.4 pg (ref 26.0–34.0)
MCHC: 34 g/dL (ref 32.0–36.0)
MCV: 83.4 fL (ref 80.0–100.0)
PLATELETS: 216 10*3/uL (ref 150–440)
RBC: 3.4 MIL/uL — ABNORMAL LOW (ref 3.80–5.20)
RDW: 15.8 % — ABNORMAL HIGH (ref 11.5–14.5)
WBC: 9.6 10*3/uL (ref 3.6–11.0)

## 2015-07-27 MED ORDER — METRONIDAZOLE IN NACL 5-0.79 MG/ML-% IV SOLN
500.0000 mg | Freq: Three times a day (TID) | INTRAVENOUS | Status: DC
  Administered 2015-07-27 – 2015-07-29 (×6): 500 mg via INTRAVENOUS
  Filled 2015-07-27 (×8): qty 100

## 2015-07-27 MED ORDER — APIXABAN 5 MG PO TABS: 5.0000 mg | ORAL_TABLET | Freq: Two times a day (BID) | ORAL | Status: DC

## 2015-07-27 NOTE — Care Management (Addendum)
Transferred to ICU 06/21 due to hypotension and tachycardia. POD 1 s/p right hip fracture. Plan is SNF at discharge, 06/23?

## 2015-07-27 NOTE — Progress Notes (Signed)
[facility] -  at [facility]   PATIENT NAME: [name]    MR#:  [id number]  DATE OF BIRTH:  20-Feb-1924  SUBJECTIVE:  CHIEF COMPLAINT:  Patient is febrile and hypotensive last night, moved to CCU. Temperature was as high as 10 69F Today during my examination patient is resting comfortably and started working with physical therapy. Family members at bedside. Blood pressure is better  REVIEW OF SYSTEMS:  CONSTITUTIONAL: No fever, fatigue or weakness.  EYES: No blurred or double vision.  EARS, NOSE, AND THROAT: No tinnitus or ear pain.  RESPIRATORY: No cough, shortness of breath, wheezing or hemoptysis.  CARDIOVASCULAR: No chest pain, orthopnea, edema.  GASTROINTESTINAL: No nausea, vomiting, diarrhea or abdominal pain.  GENITOURINARY: No dysuria, hematuria.  ENDOCRINE: No polyuria, nocturia,  HEMATOLOGY: No anemia, easy bruising or bleeding SKIN: No rash or lesion. MUSCULOSKELETAL: rt hip with clear dressing .  No joint pain or arthritis.   NEUROLOGIC: No tingling, numbness, weakness.  PSYCHIATRY: No anxiety or depression.   DRUG ALLERGIES:   Allergies  Allergen Reactions  . Azithromycin Itching  . Bactrim [Sulfamethoxazole-Trimethoprim]   . Other Nausea And Vomiting  . Penicillins Swelling    Has patient had a PCN reaction causing immediate rash, facial/tongue/throat swelling, SOB or lightheadedness with hypotension: Yes Has patient had a PCN reaction causing severe rash involving mucus membranes or skin necrosis: No Has patient had a PCN reaction that required hospitalization No Has patient had a PCN reaction occurring within the last 10 years: Yes If all of the above answers are "NO", then may proceed with Cephalosporin use.  . Sulfa Antibiotics   . Vibramycin [Doxycycline Calcium] Other (See Comments)    Reaction : Unknown    VITALS:  Blood pressure 123/64, pulse 81, temperature 98.4 F (36.9 C), temperature source Oral, resp. rate 21,  height 5' (1.524 m), weight 58.968 kg (130 lb), SpO2 95 %.  PHYSICAL EXAMINATION:  GENERAL:  82 y.o.-year-old patient lying in the bed with no acute distress.  EYES: Pupils equal, round, reactive to light and accommodation. No scleral icterus. Extraocular muscles intact.  HEENT: Head atraumatic, normocephalic. Oropharynx and nasopharynx clear.  NECK:  Supple, no jugular venous distention. No thyroid enlargement, no tenderness.  LUNGS: Normal breath sounds bilaterally, no wheezing, rales,rhonchi or crepitation. No use of accessory muscles of respiration.  CARDIOVASCULAR: S1, S2 normal. No murmurs, rubs, or gallops.  ABDOMEN: Soft, nontender, nondistended. Bowel sounds present. No organomegaly or mass.  EXTREMITIES: rt Hip s/p surgery  No pedal edema, cyanosis, or clubbing.  NEUROLOGIC: Cranial nerves II through XII are intact. Muscle strength 5/5 in all extremities. Sensation intact. Gait not checked.  PSYCHIATRIC: The patient is alert and oriented x 3.  SKIN: No obvious rash, lesion, or ulcer.    LABORATORY PANEL:   CBC  Recent Labs Lab 07/27/15 0512  WBC 9.6  HGB 9.6*  HCT 28.4*  PLT 216   ------------------------------------------------------------------------------------------------------------------  Chemistries   Recent Labs Lab 07/26/15 0651  07/27/15 0512  NA 139  < > 141  K 3.0*  < > 3.4*  CL 108  --  114*  CO2 24  --  21*  GLUCOSE 111*  < > 122*  BUN 13  --  17  CREATININE 0.72  --  0.73  CALCIUM 8.3*  --  7.7*  MG 1.5*  --   --   < > = values in this interval not displayed. ------------------------------------------------------------------------------------------------------------------  Cardiac Enzymes  Recent Labs Lab  07/21/15 2318  TROPONINI <0.03   ------------------------------------------------------------------------------------------------------------------  RADIOLOGY:  Dg Chest 2 View  07/26/2015  CLINICAL DATA:  Fever falling admission.  EXAM: CHEST  2 VIEW COMPARISON:  07/25/2015 FINDINGS: Opacity at the left lung base is stable from the earlier study. There is a small associated pleural effusion. Remainder of the lungs is clear. Cardiac silhouette is mildly enlarged. No mediastinal or hilar masses or convincing adenopathy. No pneumothorax. Bony thorax is demineralized but grossly intact. IMPRESSION: 1. No change in the opacity at the left lung base from the study obtained yesterday. When compared with exam dated 02/08/2015, there were larger pleural effusions and a greater degree of left lung base opacity. Opacity at the left lung base on the current exam may be chronic or reflect pneumonia or a combination. Electronically Signed   By: Amie Portlandavid  Ormond M.D.   On: 07/26/2015 19:11   Dg Hip Port Unilat With Pelvis 1v Right  07/26/2015  CLINICAL DATA:  Hip fracture EXAM: DG HIP (WITH OR WITHOUT PELVIS) 1V PORT RIGHT COMPARISON:  07/21/2015 FINDINGS: Two views of the right hip submitted including frontal view of the pelvis. The patient is status post right femoral neck fracture repair. There is a prosthesis in proximal right femur with anatomic alignment. IMPRESSION: Right hip femoral prosthesis with anatomic alignment. Electronically Signed   By: Natasha MeadLiviu  Pop M.D.   On: 07/26/2015 13:37    EKG:   Orders placed or performed during the hospital encounter of 07/21/15  . EKG 12-Lead  . EKG 12-Lead  . ED EKG  . ED EKG    ASSESSMENT AND PLAN:   [id number] female admitted for a right hip fracture.  # sepsis 2/2 HCAP and diarrhea Started patient on IV cefepime and vancomycin GI is recommending to continue Cipro and Flagyl in the interim as patient is having diarrhea ID consult is placed IV fluids  Blood cultures with no growth in less than 24 hours Chest x-ray from 6/21 has revealed left lower lobe opacity  #. Right hip fracture. Postop day #1 Postop care by orthopedics  Patient was previously diagnosed with PE in December 2016 and has been  Eliquis 5 Mg,which is currently on hold,  Will resume eliquis if okay from orthopedic standpoint . EKG shows a normal sinus rhythm.  Continue pain management as needed  #Low-grade fever with the diarrhea -resolved  Patient has chronic history of irritable bowel syndrome and gets bouts of diarrhea intermittently which is her normal  Will get stool culture and sensitivity and check stool for occult blood -stool sample was never collected as the patient was not having any BMs for 2 days  Enteric precautions discontinued  Continue Ciprofloxacin for 5 days and IV Flagyl for 7 days empirically . On probiotics. Appreciate GI recommendations on IV fluids  Chest x-ray negative. Blood cultures and urine cultures are negative Monitor white blood count  # Dementia. Continue Namenda 10 mg daily, paroxetine 10 mg daily and Seroquel 25 mg daily at bedtime   . # History of PE. Patient diagnosed in December 2016, has been on Eliquis past 6 months,Eliquis currently on hold. No prior DVT/PE.      All the records are reviewed and case discussed with Care Management/Social Workerr. Management plans discussed with the patient, family and they are in agreement.  CODE STATUS: fc   TOTAL TIME TAKING CARE OF THIS PATIENT: 36  minutes.   POSSIBLE D/C IN 2-3 DAYS, DEPENDING ON CLINICAL CONDITION.  Note: This dictation was  prepared with Dragon dictation along with smaller phrase technology. Any transcriptional errors that result from this process are unintentional.   Ramonita LabGouru, Avarose Mervine M.D on 07/27/2015 at 3:50 PM  Between 7am to 6pm - Pager - [phone] After 6pm go to [email or web address] - password [email or web address] [email or web address]  [facility]  Office  [phone]  CC: Primary care physician; Rolm GalaGRANDIS, HEIDI, MD

## 2015-07-27 NOTE — Anesthesia Postprocedure Evaluation (Signed)
Anesthesia Post Note  Patient: Harvel QualeDorothy L Larrick  Procedure(s) Performed: Procedure(s) (LRB): ARTHROPLASTY BIPOLAR HIP (HEMIARTHROPLASTY) (Right)  Patient location during evaluation: ICU Anesthesia Type: Spinal Level of consciousness: awake, awake and alert and lethargic Pain management: pain level controlled Respiratory status: spontaneous breathing and nonlabored ventilation Cardiovascular status: stable Postop Assessment: no headache and no backache Anesthetic complications: no    Last Vitals:  Filed Vitals:   07/27/15 0500 07/27/15 0600  BP: 115/60 121/76  Pulse: 88 90  Temp:    Resp: 22 21    Last Pain:  Filed Vitals:   07/27/15 0610  PainSc: Asleep                 Lyn RecordsNoles,  Melita Villalona R

## 2015-07-27 NOTE — Progress Notes (Signed)
Patient's daughter chose [facility]. Per MD patient will likely be ready for D/C tomorrow. [facility] a message making her aware of above. CSW also contacted Medicare Bundle Case Manager and made her aware of above. CSW will continue to follow and assist as needed.   Jetta LoutBailey Morgan, LCSW [phone]

## 2015-07-27 NOTE — Progress Notes (Signed)
PT Cancellation Note  Patient Details Name: Colleen QualeDorothy L Howerton MRN: [id number] DOB: 09/30/1924   Cancelled Treatment:    Reason Eval/Treat Not Completed: Fatigue/lethargy limiting ability to participate. Treatment attempted; nursing still in room and note they just return pt to bed dependent of 2, as pt completely worn out. Pt also just given morphine. Offer PT to pt who refused at this time noting she is just too exhausted. Resume PT tomorrow.    Kristeen MissHeidi Elizabeth Bishop, [place]PTA 07/27/2015, 3:18 PM

## 2015-07-27 NOTE — Evaluation (Signed)
Physical Therapy Evaluation Patient Details Name: Colleen QualeDorothy L Mcgrady MRN: [id number] DOB: 12/02/1924 Today's Date: 07/27/2015   History of Present Illness  Pt admitted for R hip fracture, however due to medical complications, did not receive hip hemiarthroplasty until 07/26/15. S/p surgery, pt transferred to CCU secondary to low BP. BP WNL this date and is cleared to work with therapy.  Clinical Impression  Pt is a pleasant 82 year old female who was admitted for R hip fracture. Surgery performed on 07/26/15 for R hip hemiarthroplasty. Pt performs bed mobility/transfers with max assist +2, and ambulation with total assist +2. Pt educated on post hip precautions, however confused, unable to recall. Pt very fearful of movement secondary to pain, takes increased assist for all mobility. All mobility performed on 3L of O2 with sats WNL. Pt demonstrates deficits with strength/mobility/endurance/pain. Would benefit from skilled PT to address above deficits and promote optimal return to PLOF; recommend transition to STR upon discharge from acute hospitalization.       Follow Up Recommendations SNF    Equipment Recommendations       Recommendations for Other Services       Precautions / Restrictions Precautions Precautions: Fall;Posterior Hip Precaution Booklet Issued: No Restrictions Weight Bearing Restrictions: Yes RLE Weight Bearing: Weight bearing as tolerated      Mobility  Bed Mobility Overal bed mobility: Needs Assistance;+2 for physical assistance Bed Mobility: Supine to Sit     Supine to sit: Max assist;+2 for physical assistance     General bed mobility comments: assist for scooting out towards EOB and trunk support. Pt able to follow basic commands and initiate transfer, however freezes secondary to pain. Pt with heavy post leaning noted during static sitting, cues for upright posture. Pt very fearful of movement.  Transfers Overall transfer level: Needs assistance Equipment  used: Rolling walker (2 wheeled) Transfers: Sit to/from Stand Sit to Stand: Max assist;+2 physical assistance         General transfer comment: Pt cued for hip precautions prior to transfer. Pt very fearful of movement and once standing, needs cues for B knee extension and upright posture. Pt has difficulty placing B UE onto RW grips  Ambulation/Gait Ambulation/Gait assistance: Total assist;+2 physical assistance Ambulation Distance (Feet): 3 Feet Assistive device: Rolling walker (2 wheeled) Gait Pattern/deviations: Step-to pattern     General Gait Details: Pt fatigues quickly and is initially able to take steps, however then needs assistance for therapist to weight shift towards L side to move R foot. Pt fatigues and tries to sit down prior to chair arrival. Pt performs slow step to gait pattern  Stairs            Wheelchair Mobility    Modified Rankin (Stroke Patients Only)       Balance Overall balance assessment: History of Falls;Needs assistance Sitting-balance support: Feet supported Sitting balance-Leahy Scale: Fair     Standing balance support: Bilateral upper extremity supported Standing balance-Leahy Scale: Poor                               Pertinent Vitals/Pain Pain Assessment: Faces Faces Pain Scale: Hurts whole lot Pain Location: R hip with movement Pain Descriptors / Indicators: Operative site guarding Pain Intervention(s): Limited activity within patient's tolerance;Repositioned    Home Living Family/patient expects to be discharged to:: Private residence Living Arrangements: Children (pt reports she lives alone)   Type of Home: House Home Access: Ramped entrance  Home Layout: One level Home Equipment: Walker - 2 wheels      Prior Function Level of Independence: Needs assistance   Gait / Transfers Assistance Needed: Pt reports she ambulates house hold distances with RW           Hand Dominance         Extremity/Trunk Assessment   Upper Extremity Assessment: Generalized weakness (R UE weaker compared to L UE; grossly 3+/5)           Lower Extremity Assessment: Generalized weakness (R LE grossly 2/5; L LE grossly 3+/5)         Communication   Communication: HOH  Cognition Arousal/Alertness: Awake/alert Behavior During Therapy: WFL for tasks assessed/performed Overall Cognitive Status: Impaired/Different from baseline                      General Comments      Exercises Other Exercises Other Exercises: R LE ther-ex performed including 10 reps on ankle pumps, quad sets, glut sets ,and hip abd/add. All ther-ex performed with mod assist and cues for sequencing and encouragement      Assessment/Plan    PT Assessment Patient needs continued PT services  PT Diagnosis Difficulty walking;Generalized weakness;Acute pain   PT Problem List Decreased strength;Decreased range of motion;Decreased balance;Decreased mobility;Pain;Decreased knowledge of use of DME  PT Treatment Interventions DME instruction;Gait training;Therapeutic exercise   PT Goals (Current goals can be found in the Care Plan section) Acute Rehab PT Goals Patient Stated Goal: to be able to walk PT Goal Formulation: With patient Time For Goal Achievement: 08/10/15 Potential to Achieve Goals: Good    Frequency BID   Barriers to discharge        Co-evaluation               End of Session Equipment Utilized During Treatment: Gait belt;Oxygen Activity Tolerance: Patient limited by pain;Patient limited by fatigue Patient left: in chair;with chair alarm set;with family/visitor present Nurse Communication: Mobility status;Precautions         Time: [id number] PT Time Calculation (min) (ACUTE ONLY): 40 min   Charges:   PT Evaluation $PT Eval High Complexity: 1 Procedure PT Treatments $Therapeutic Exercise: 8-22 mins   PT G Codes:        Breydon Senters 07/27/2015, 11:38 AM  Elizabeth PalauStephanie  Jamiyah Dingley, PT, DPT [phone]

## 2015-07-27 NOTE — Progress Notes (Signed)
Subjective:  Discussed with Dr. Amado CoeGouru.  Patient with findings on CXR consistent with pneumonia.  Antibiotics started.  ID consult ordered.  Patient reports pain as mild.  Patient seen in ICU with daughter at the bedside.  Objective:   VITALS:   Filed Vitals:   07/27/15 1400 07/27/15 1600 07/27/15 1700 07/27/15 1800  BP: 123/64 124/61 105/62 102/58  Pulse: 81 91 90 93  Temp: 98.4 F (36.9 C)     TempSrc: Oral     Resp: 21 22 29 23   Height:      Weight:      SpO2: 95% 99% 95% 94%    PHYSICAL EXAM:  Right LE:  Dressing Clean dry and intact. Patient has palpable pedal pulses. Abduction pillow is in place. Patient can flex and extend her toes and dorsiflex and plantarflex her ankle. There is no significant swelling of the thigh or leg. Her compartments are soft and compressible and both thigh and leg as well.  LABS  Results for orders placed or performed during the hospital encounter of 07/21/15 (from the past 24 hour(s))  CBC     Status: Abnormal   Collection Time: 07/26/15  7:52 PM  Result Value Ref Range   WBC 8.2 3.6 - 11.0 K/uL   RBC 3.40 (L) 3.80 - 5.20 MIL/uL   Hemoglobin 9.5 (L) 12.0 - 16.0 g/dL   HCT 16.128.7 (L) 09.635.0 - 04.547.0 %   MCV 84.5 80.0 - 100.0 fL   MCH 28.1 26.0 - 34.0 pg   MCHC 33.2 32.0 - 36.0 g/dL   RDW 40.915.6 (H) 81.111.5 - 91.414.5 %   Platelets 203 150 - 440 K/uL  Lactic acid, plasma     Status: None   Collection Time: 07/26/15  7:52 PM  Result Value Ref Range   Lactic Acid, Venous 1.1 0.5 - 2.0 mmol/L  Urinalysis complete, with microscopic ([facility] only)     Status: Abnormal   Collection Time: 07/26/15  7:53 PM  Result Value Ref Range   Color, Urine YELLOW (A) YELLOW   APPearance CLEAR (A) CLEAR   Glucose, UA NEGATIVE NEGATIVE mg/dL   Bilirubin Urine NEGATIVE NEGATIVE   Ketones, ur 1+ (A) NEGATIVE mg/dL   Specific Gravity, Urine 1.015 1.005 - 1.030   Hgb urine dipstick NEGATIVE NEGATIVE   pH 5.0 5.0 - 8.0   Protein, ur NEGATIVE NEGATIVE mg/dL   Nitrite  NEGATIVE NEGATIVE   Leukocytes, UA 1+ (A) NEGATIVE   RBC / HPF 0-5 0 - 5 RBC/hpf   WBC, UA 6-30 0 - 5 WBC/hpf   Bacteria, UA NONE SEEN NONE SEEN   Squamous Epithelial / LPF NONE SEEN NONE SEEN   Mucous PRESENT   CULTURE, BLOOD (ROUTINE X 2) w Reflex to ID Panel     Status: None (Preliminary result)   Collection Time: 07/26/15  7:54 PM  Result Value Ref Range   Specimen Description BLOOD RIGHT ASSIST CONTROL    Special Requests      BOTTLES DRAWN AEROBIC AND ANAEROBIC  AERO 6CC ANA 3CC   Culture NO GROWTH < 12 HOURS    Report Status PENDING   CULTURE, BLOOD (ROUTINE X 2) w Reflex to ID Panel     Status: None (Preliminary result)   Collection Time: 07/26/15  7:54 PM  Result Value Ref Range   Specimen Description BLOOD LEFT WRIST    Special Requests      BOTTLES DRAWN AEROBIC AND ANAEROBIC  AERO 5CC ANA 3CC   Culture  NO GROWTH < 12 HOURS    Report Status PENDING   Glucose, capillary     Status: Abnormal   Collection Time: 07/26/15  9:01 PM  Result Value Ref Range   Glucose-Capillary 134 (H) 65 - 99 mg/dL  MRSA PCR Screening     Status: None   Collection Time: 07/26/15  9:08 PM  Result Value Ref Range   MRSA by PCR NEGATIVE NEGATIVE  Lactic acid, plasma     Status: None   Collection Time: 07/26/15 10:43 PM  Result Value Ref Range   Lactic Acid, Venous 1.6 0.5 - 2.0 mmol/L  CBC     Status: Abnormal   Collection Time: 07/27/15  5:12 AM  Result Value Ref Range   WBC 9.6 3.6 - 11.0 K/uL   RBC 3.40 (L) 3.80 - 5.20 MIL/uL   Hemoglobin 9.6 (L) 12.0 - 16.0 g/dL   HCT 78.228.4 (L) 95.635.0 - 21.347.0 %   MCV 83.4 80.0 - 100.0 fL   MCH 28.4 26.0 - 34.0 pg   MCHC 34.0 32.0 - 36.0 g/dL   RDW 08.615.8 (H) 57.811.5 - 46.914.5 %   Platelets 216 150 - 440 K/uL  Basic metabolic panel     Status: Abnormal   Collection Time: 07/27/15  5:12 AM  Result Value Ref Range   Sodium 141 135 - 145 mmol/L   Potassium 3.4 (L) 3.5 - 5.1 mmol/L   Chloride 114 (H) 101 - 111 mmol/L   CO2 21 (L) 22 - 32 mmol/L   Glucose, Bld  122 (H) 65 - 99 mg/dL   BUN 17 6 - 20 mg/dL   Creatinine, Ser 6.290.73 0.44 - 1.00 mg/dL   Calcium 7.7 (L) 8.9 - 10.3 mg/dL   GFR calc non Af Amer >60 >60 mL/min   GFR calc Af Amer >60 >60 mL/min   Anion gap 6 5 - 15    Dg Chest 2 View  07/26/2015  CLINICAL DATA:  Fever falling admission. EXAM: CHEST  2 VIEW COMPARISON:  07/25/2015 FINDINGS: Opacity at the left lung base is stable from the earlier study. There is a small associated pleural effusion. Remainder of the lungs is clear. Cardiac silhouette is mildly enlarged. No mediastinal or hilar masses or convincing adenopathy. No pneumothorax. Bony thorax is demineralized but grossly intact. IMPRESSION: 1. No change in the opacity at the left lung base from the study obtained yesterday. When compared with exam dated 02/08/2015, there were larger pleural effusions and a greater degree of left lung base opacity. Opacity at the left lung base on the current exam may be chronic or reflect pneumonia or a combination. Electronically Signed   By: Amie Portlandavid  Ormond M.D.   On: 07/26/2015 19:11   Dg Hip Port Unilat With Pelvis 1v Right  07/26/2015  CLINICAL DATA:  Hip fracture EXAM: DG HIP (WITH OR WITHOUT PELVIS) 1V PORT RIGHT COMPARISON:  07/21/2015 FINDINGS: Two views of the right hip submitted including frontal view of the pelvis. The patient is status post right femoral neck fracture repair. There is a prosthesis in proximal right femur with anatomic alignment. IMPRESSION: Right hip femoral prosthesis with anatomic alignment. Electronically Signed   By: Natasha MeadLiviu  Pop M.D.   On: 07/26/2015 13:37    Assessment/Plan: 1 Day Post-Op   Active Problems:   Closed right hip fracture (HCC)   Hip fracture (HCC)  Continue antibiotics as ordered per medicine. Patient was up out of bed to a chair today for a few hours per her  daughter. Continue physical therapy as medically appropriate. Patient may advance to weightbearing as tolerated. Recheck labs in the morning. ID  consult pending.    Juanell Fairly , MD 07/27/2015, 6:38 PM

## 2015-07-27 NOTE — Progress Notes (Signed)
OT Cancellation Note  Patient Details Name: [name] MRN: [id number] DOB: 06/15/1924   Cancelled Treatment:     Patient fatigued this pm, will reattempt OT eval next date.  Zriyah Kopplin T Paisleigh Maroney, OTR/L, CLT  [name] 07/27/2015, 4:08 PM

## 2015-07-27 NOTE — Consult Note (Signed)
Patient had deterioration late yesterday and was moved to CCU.  Pt had loose stool this afternoon.  She spiked temp yesterday, possible pneumonia.  Exam shows no tachypnea, abdomen with decreased bowel sounds and mildly distended compared to yesterday.  Would continue  cipro and flagyl for now.

## 2015-07-27 NOTE — Progress Notes (Signed)
Foley catheter left in place due to pts decreased urine output. Dr. Rico AlaKransinski made aware. Foley can be pulled 07/28/15 if urinary issues resolve.

## 2015-07-28 DIAGNOSIS — L8990 Pressure ulcer of unspecified site, unspecified stage: Secondary | ICD-10-CM | POA: Insufficient documentation

## 2015-07-28 LAB — CBC WITH DIFFERENTIAL/PLATELET
Basophils Absolute: 0 10*3/uL (ref 0–0.1)
Basophils Relative: 0 %
EOS ABS: 0.4 10*3/uL (ref 0–0.7)
HCT: 27.8 % — ABNORMAL LOW (ref 35.0–47.0)
Hemoglobin: 9.1 g/dL — ABNORMAL LOW (ref 12.0–16.0)
LYMPHS ABS: 0.9 10*3/uL — AB (ref 1.0–3.6)
MCH: 27.2 pg (ref 26.0–34.0)
MCHC: 32.9 g/dL (ref 32.0–36.0)
MCV: 82.8 fL (ref 80.0–100.0)
MONO ABS: 1.2 10*3/uL — AB (ref 0.2–0.9)
Neutro Abs: 8.6 10*3/uL — ABNORMAL HIGH (ref 1.4–6.5)
Neutrophils Relative %: 78 %
PLATELETS: 260 10*3/uL (ref 150–440)
RBC: 3.36 MIL/uL — AB (ref 3.80–5.20)
RDW: 16.1 % — ABNORMAL HIGH (ref 11.5–14.5)
WBC: 11.1 10*3/uL — AB (ref 3.6–11.0)

## 2015-07-28 LAB — BASIC METABOLIC PANEL
Anion gap: 5 (ref 5–15)
BUN: 19 mg/dL (ref 6–20)
CHLORIDE: 112 mmol/L — AB (ref 101–111)
CO2: 20 mmol/L — AB (ref 22–32)
CREATININE: 0.67 mg/dL (ref 0.44–1.00)
Calcium: 7.9 mg/dL — ABNORMAL LOW (ref 8.9–10.3)
GFR calc Af Amer: >60 mL/min (ref >60)
GFR calc non Af Amer: >60 mL/min (ref >60)
GLUCOSE: 109 mg/dL — AB (ref 65–99)
POTASSIUM: 2.9 mmol/L — AB (ref 3.5–5.1)
SODIUM: 137 mmol/L (ref 135–145)

## 2015-07-28 LAB — MAGNESIUM: MAGNESIUM: 1.5 mg/dL — AB (ref 1.7–2.4)

## 2015-07-28 LAB — SURGICAL PATHOLOGY

## 2015-07-28 LAB — PHOSPHORUS: Phosphorus: 2.6 mg/dL (ref 2.5–4.6)

## 2015-07-28 MED ORDER — METHOCARBAMOL 500 MG PO TABS: 500.0000 mg | ORAL_TABLET | Freq: Three times a day (TID) | ORAL | Status: DC | PRN

## 2015-07-28 MED ORDER — METRONIDAZOLE 500 MG PO TABS: 500.0000 mg | ORAL_TABLET | Freq: Three times a day (TID) | ORAL | Status: DC

## 2015-07-28 MED ORDER — SENNA 8.6 MG PO TABS: 1.0000 | ORAL_TABLET | Freq: Two times a day (BID) | ORAL | Status: AC

## 2015-07-28 MED ORDER — POTASSIUM CHLORIDE 10 MEQ/100ML IV SOLN
10.0000 meq | INTRAVENOUS | Status: AC
  Administered 2015-07-28 (×4): 10 meq via INTRAVENOUS
  Filled 2015-07-28 (×4): qty 100

## 2015-07-28 MED ORDER — LEVOFLOXACIN 500 MG PO TABS: 500.0000 mg | ORAL_TABLET | Freq: Every day | ORAL | Status: DC

## 2015-07-28 MED ORDER — OXYCODONE HCL 5 MG PO TABS: 5.0000 mg | ORAL_TABLET | ORAL | Status: DC | PRN

## 2015-07-28 MED ORDER — ACETAMINOPHEN 325 MG PO TABS: 650.0000 mg | ORAL_TABLET | Freq: Four times a day (QID) | ORAL | Status: AC | PRN

## 2015-07-28 MED ORDER — MENTHOL 3 MG MT LOZG: 1.0000 | LOZENGE | OROMUCOSAL | Status: DC | PRN

## 2015-07-28 MED ORDER — POTASSIUM CHLORIDE 20 MEQ PO PACK
40.0000 meq | PACK | Freq: Once | ORAL | Status: AC
  Administered 2015-07-28: 40 meq via ORAL
  Filled 2015-07-28: qty 2

## 2015-07-28 MED ORDER — SACCHAROMYCES BOULARDII 250 MG PO CAPS: 250.0000 mg | ORAL_CAPSULE | Freq: Two times a day (BID) | ORAL | Status: DC

## 2015-07-28 MED ORDER — IPRATROPIUM-ALBUTEROL 0.5-2.5 (3) MG/3ML IN SOLN: 3.0000 mL | Freq: Four times a day (QID) | RESPIRATORY_TRACT | Status: DC | PRN

## 2015-07-28 MED ORDER — FERROUS SULFATE 325 (65 FE) MG PO TABS: 325.0000 mg | ORAL_TABLET | Freq: Two times a day (BID) | ORAL | Status: DC

## 2015-07-28 MED ORDER — MAGNESIUM SULFATE 4 GM/100ML IV SOLN
4.0000 g | Freq: Once | INTRAVENOUS | Status: AC
  Administered 2015-07-28: 4 g via INTRAVENOUS
  Filled 2015-07-28: qty 100

## 2015-07-28 NOTE — Progress Notes (Signed)
Physical Therapy Treatment Patient Details Name: Colleen QualeDorothy L Dragovich MRN: [id number] DOB: 10/17/1924 Today's Date: 07/28/2015    History of Present Illness 82 y/o s/p R hip hemiarthroplasty.  Pt with complications and transfered to CCU.    PT Comments    Pt in CCU room lethargic, but willing to participate with some bed exercises.  Daughter present and supportive. Pt initially seemed like she would be pain limited, but after getting into the exercises and with some cuing and instruction she showed good relative tolerance to activities and was able to so some A/AAROM exercises.    Follow Up Recommendations  SNF     Equipment Recommendations       Recommendations for Other Services       Precautions / Restrictions Precautions Precautions: Fall;Posterior Hip Restrictions RLE Weight Bearing: Weight bearing as tolerated    Mobility  Bed Mobility               General bed mobility comments: deferred, pt's potassium was 2.9 and pt was relatively lethargic and hesitant to do much movement  Transfers                    Ambulation/Gait                 Stairs            Wheelchair Mobility    Modified Rankin (Stroke Patients Only)       Balance                                    Cognition Arousal/Alertness: Lethargic Behavior During Therapy: WFL for tasks assessed/performed Overall Cognitive Status: Within Functional Limits for tasks assessed                      Exercises Total Joint Exercises Ankle Circles/Pumps: AAROM;PROM;10 reps Quad Sets: 10 reps;Strengthening Gluteal Sets: Strengthening;10 reps Short Arc Quad: AROM;AAROM;10 reps Heel Slides: AAROM;PROM;10 reps Hip ABduction/ADduction: AROM;AAROM;10 reps    General Comments        Pertinent Vitals/Pain Pain Assessment: 0-10 Pain Score:  (reports it's minimal, increases with exercises)    Home Living                      Prior Function             PT Goals (current goals can now be found in the care plan section)      Frequency  BID    PT Plan Current plan remains appropriate    Co-evaluation             End of Session Equipment Utilized During Treatment: Oxygen Activity Tolerance: Patient limited by pain;Patient limited by fatigue Patient left: in bed;with call bell/phone within reach;with family/visitor present     Time: 1132-1200 PT Time Calculation (min) (ACUTE ONLY): 28 min  Charges:  $Therapeutic Exercise: 23-37 mins                    G Codes:      Malachi ProGalen R Yuriel Lopezmartinez, DPT 07/28/2015, 2:59 PM

## 2015-07-28 NOTE — Discharge Summary (Addendum)
[facility] [place] at [facility]   PATIENT NAME: Colleen LaughterDorothy Chavez    MR#:  [id number]  DATE OF BIRTH:  09/06/1924  DATE OF ADMISSION:  07/21/2015 ADMITTING PHYSICIAN: Altamese DillingVaibhavkumar Vachhani, MD  DATE OF DISCHARGE: 07/29/15 PRIMARY CARE PHYSICIAN: Rolm GalaGRANDIS, HEIDI, MD    ADMISSION DIAGNOSIS:  Pain [R52] Fall [W19.XXXA] Hip fracture, right, closed, initial encounter (HCC) [S72.001A]  DISCHARGE DIAGNOSIS:  Active Problems:   Closed right hip fracture (HCC)   Hip fracture (HCC)   Pressure ulcer HCAP  SECONDARY DIAGNOSIS:   Past Medical History  Diagnosis Date  . Vaginal bleeding   . Urethra, polyp   . Blood in stool   . Hematuria   . IBS (irritable bowel syndrome)   . Eczema   . Insomnia   . Osteopenia   . Anterolisthesis   . Dementia   . LVH (left ventricular hypertrophy) due to hypertensive disease   . Hypertension     HOSPITAL COURSE:  82 female admitted for a right hip fracture.  # sepsis 2/2 HCAP and diarrhea Started patient on IV cefepime and vancomycinClinically better, Dr. Sampson GoonFitzgerald has recommended to discharge patient with levofloxacin and Flagyl for 7 days ID recommendations appreciated IV fluids are given. Hypotension resolved Blood cultures with no growth in less than 24 hours Chest x-ray from 6/21 has revealed left lower lobe opacity  #. Right hip frasture status post surgery Postop care by orthopedics  Patient was previously diagnosed with PE in December 2016 and has been Eliquis 5 Mg,which was held prior to the surgery and resumed after surgery . EKG shows a normal sinus rhythm.  Continue pain management as needed  #Low-grade fever with the diarrhea -resolved  Patient has chronic history of irritable bowel syndrome and gets bouts of diarrhea intermittently which is her normal  Will get stool culture and sensitivity and check stool for occult blood -stool sample was never collected as the patient was not having any BMs  for 2 days  Enteric precautions discontinued  Continue levofloxacin and Flagyl for 7 days as recommended by Dr. Sampson GoonFitzgerald  Appreciate GI recommendations Chest x-ray negative. Blood cultures and urine cultures are negative  # Dementia. Continue Namenda 10 mg daily, paroxetine 10 mg daily and Seroquel 25 mg daily at bedtime   . # History of PE. Patient diagnosed in December 2016, has been on Eliquis past 6 months,Eliquis resumed   DISCHARGE CONDITIONS:   FAIR  CONSULTS OBTAINED:  Treatment Team:  Juanell FairlyKevin Krasinski, MD Scot Junobert T Elliott, MD Ramonita LabAruna Gouru, MD Mick Sellavid P Fitzgerald, MD   PROCEDURES  Right hip surgery  DRUG ALLERGIES:   Allergies  Allergen Reactions  . Azithromycin Itching  . Bactrim [Sulfamethoxazole-Trimethoprim]   . Other Nausea And Vomiting  . Penicillins Swelling    Has patient had a PCN reaction causing immediate rash, facial/tongue/throat swelling, SOB or lightheadedness with hypotension: Yes Has patient had a PCN reaction causing severe rash involving mucus membranes or skin necrosis: No Has patient had a PCN reaction that required hospitalization No Has patient had a PCN reaction occurring within the last 10 years: Yes If all of the above answers are "NO", then may proceed with Cephalosporin use.  . Sulfa Antibiotics   . Vibramycin [Doxycycline Calcium] Other (See Comments)    Reaction : Unknown    DISCHARGE MEDICATIONS:   Current Discharge Medication List    START taking these medications   Details  acetaminophen (TYLENOL) 325 MG tablet Take 2 tablets (650 mg total)  by mouth every 6 (six) hours as needed for mild pain (or Fever >/= 101).    ipratropium-albuterol (DUONEB) 0.5-2.5 (3) MG/3ML SOLN Take 3 mLs by nebulization every 6 (six) hours as needed. Qty: 360 mL, Refills: 0    menthol-cetylpyridinium (CEPACOL) 3 MG lozenge Take 1 lozenge (3 mg total) by mouth as needed for sore throat (sore throat). Qty: 100 tablet, Refills: 12     methocarbamol (ROBAXIN) 500 MG tablet Take 1 tablet (500 mg total) by mouth every 8 (eight) hours as needed for muscle spasms. Qty: 30 tablet, Refills: 0    metroNIDAZOLE (FLAGYL) 500 MG tablet Take 1 tablet (500 mg total) by mouth 3 (three) times daily. Qty: 21 tablet, Refills: 0    oxyCODONE (OXY IR/ROXICODONE) 5 MG immediate release tablet Take 1-2 tablets (5-10 mg total) by mouth every 4 (four) hours as needed for breakthrough pain ((for MODERATE breakthrough pain)). Qty: 30 tablet, Refills: 0    potassium chloride (K-DUR,KLOR-CON) 10 MEQ tablet Take 1 tablet (10 mEq total) by mouth daily. For five days starting sunday Qty: 5 tablet, Refills: 0    saccharomyces boulardii (FLORASTOR) 250 MG capsule Take 1 capsule (250 mg total) by mouth 2 (two) times daily. Qty: 30 capsule, Refills: 0    senna (SENOKOT) 8.6 MG TABS tablet Take 1 tablet (8.6 mg total) by mouth 2 (two) times daily. Qty: 120 each, Refills: 0      CONTINUE these medications which have CHANGED   Details  levofloxacin (LEVAQUIN) 500 MG tablet Take 1 tablet (500 mg total) by mouth daily. Qty: 7 tablet, Refills: 0      CONTINUE these medications which have NOT CHANGED   Details  apixaban (ELIQUIS) 5 MG TABS tablet Take 1 tablet (5 mg total) by mouth 2 (two) times daily. Qty: 60 tablet, Refills: 11    aspirin 81 MG chewable tablet Chew by mouth.    cyanocobalamin (,VITAMIN B-12,) 1000 MCG/ML injection Inject 1,000 mcg into the muscle every 30 (thirty) days.    dicyclomine (BENTYL) 20 MG tablet Take 0.5 mg by mouth 3 (three) times daily as needed for spasms.     folic acid (FOLVITE) 1 MG tablet Take 1 mg by mouth daily.     Iron-Vitamins (GERITOL COMPLETE) TABS Take 1 tablet by mouth daily.     ketoconazole (NIZORAL) 2 % cream Apply 1 application topically daily.     Lifitegrast (XIIDRA) 5 % SOLN Place 1 drop into both eyes 2 (two) times daily.    Magnesium 250 MG TABS Take 1 tablet by mouth daily.     memantine (NAMENDA) 10 MG tablet Take 5 mg by mouth 2 (two) times daily.     PARoxetine (PAXIL) 10 MG tablet Take 10 mg by mouth daily.     QUEtiapine (SEROQUEL) 25 MG tablet Take 25 mg by mouth at bedtime.    mometasone-formoterol (DULERA) 100-5 MCG/ACT AERO Inhale 2 puffs into the lungs 2 (two) times daily. Qty: 1 Inhaler, Refills: 0         DISCHARGE INSTRUCTIONS:   Activity as tolerated per PT recommendations Diet regular -Follow up with primary care physician at the facility in 3 days  follow-up with Dr. Martha ClanKrasinski in 2 weeks and  follow-up with GI as needed or as recommended    DIET:  Regular diet  DISCHARGE CONDITION:  Fair  ACTIVITY:  Activity as tolerated per PT  OXYGEN:  Home Oxygen: No.   Oxygen Delivery: room air  DISCHARGE LOCATION:  nursing  home   If you experience worsening of your admission symptoms, develop shortness of breath, life threatening emergency, suicidal or homicidal thoughts you must seek medical attention immediately by calling 911 or calling your MD immediately  if symptoms less severe.  You Must read complete instructions/literature along with all the possible adverse reactions/side effects for all the Medicines you take and that have been prescribed to you. Take any new Medicines after you have completely understood and accpet all the possible adverse reactions/side effects.   Please note  You were cared for by a hospitalist during your hospital stay. If you have any questions about your discharge medications or the care you received while you were in the hospital after you are discharged, you can call the unit and asked to speak with the hospitalist on call if the hospitalist that took care of you is not available. Once you are discharged, your primary care physician will handle any further medical issues. Please note that NO REFILLS for any discharge medications will be authorized once you are discharged, as it is imperative that you  return to your primary care physician (or establish a relationship with a primary care physician if you do not have one) for your aftercare needs so that they can reassess your need for medications and monitor your lab values.     Today  Chief Complaint  Patient presents with  . Fall  . Hip Pain   Patient is resting comfortably. Tolerating diet. No diarrhea. Denies any shortness of breath. Working with physical therapy. Family at bedside. Discontinue Foley catheter  ROS:  CONSTITUTIONAL: Denies fevers, chills. Denies any fatigue, weakness.  EYES: Denies blurry vision, double vision, eye pain. EARS, NOSE, THROAT: Denies tinnitus, ear pain, hearing loss. RESPIRATORY: Denies cough, wheeze, shortness of breath.  CARDIOVASCULAR: Denies chest pain, palpitations, edema.  GASTROINTESTINAL: Denies nausea, vomiting, diarrhea, abdominal pain. Denies bright red blood per rectum. GENITOURINARY: Denies dysuria, hematuria. ENDOCRINE: Denies nocturia or thyroid problems. HEMATOLOGIC AND LYMPHATIC: Denies easy bruising or bleeding. SKIN: Denies rash or lesion. MUSCULOSKELETAL:  right hip PAIN Denies pain in neck, back, shoulder, knees, hips or arthritic symptoms.  NEUROLOGIC: Denies paralysis, paresthesias.  PSYCHIATRIC: Denies anxiety or depressive symptoms.   VITAL SIGNS:  Blood pressure 121/60, pulse 83, temperature 98.9 F (37.2 C), temperature source Oral, resp. rate 15, height 5' (1.524 m), weight 58.968 kg (130 lb), SpO2 93 %.  I/O:    Intake/Output Summary (Last 24 hours) at 07/29/15 0948 Last data filed at 07/29/15 0945  Gross per 24 hour  Intake   1125 ml  Output    280 ml  Net    845 ml    PHYSICAL EXAMINATION:  GENERAL:  82 y.o.-year-old patient lying in the bed with no acute distress.  EYES: Pupils equal, round, reactive to light and accommodation. No scleral icterus. Extraocular muscles intact.  HEENT: Head atraumatic, normocephalic. Oropharynx and nasopharynx clear.   NECK:  Supple, no jugular venous distention. No thyroid enlargement, no tenderness.  LUNGS: Normal breath sounds bilaterally, no wheezing, rales,rhonchi or crepitation. No use of accessory muscles of respiration.  CARDIOVASCULAR: S1, S2 normal. No murmurs, rubs, or gallops.  ABDOMEN: Soft, non-tender, non-distended. Bowel sounds present. No organomegaly or mass.  EXTREMITIES:Right hip surgery in clean honeycomb dressing No pedal edema, cyanosis, or clubbing.  NEUROLOGIC: Cranial nerves II through XII are intact. Muscle strength 5/5 in all extremities. Sensation intact. Gait not checked.  PSYCHIATRIC: The patient is alert and oriented x 3.  SKIN: No  obvious rash, lesion, or ulcer.   DATA REVIEW:   CBC  Recent Labs Lab 07/28/15 0635 07/29/15 0802  WBC 11.1*  --   HGB 9.1* 8.7*  HCT 27.8*  --   PLT 260  --     Chemistries   Recent Labs Lab 07/29/15 0802  NA 137  K 3.2*  CL 112*  CO2 20*  GLUCOSE 100*  BUN 15  CREATININE 0.72  CALCIUM 7.7*  MG 1.8    Cardiac Enzymes No results for input(s): TROPONINI in the last 168 hours.  Microbiology Results  Results for orders placed or performed during the hospital encounter of 07/21/15  Urine culture     Status: None   Collection Time: 07/22/15  4:20 AM  Result Value Ref Range Status   Specimen Description URINE, CLEAN CATCH  Final   Special Requests NONE  Final   Culture NO GROWTH Performed at [facility]   Final   Report Status 07/23/2015 FINAL  Final  Surgical pcr screen     Status: Abnormal   Collection Time: 07/22/15  4:20 AM  Result Value Ref Range Status   MRSA, PCR NEGATIVE NEGATIVE Final   Staphylococcus aureus POSITIVE (A) NEGATIVE Final    Comment:        The Xpert SA Assay (FDA approved for NASAL specimens in patients over 31 years of age), is one component of a comprehensive surveillance program.  Test performance has been validated by [facility] for patients greater than or equal to 1 year  old. It is not intended to diagnose infection nor to guide or monitor treatment. CRITICAL RESULT CALLED TO, READ BACK BY AND VERIFIED WITH: ANESSA MACRHON @ [id number] 07/22/15 BY [facility]   CULTURE, BLOOD (ROUTINE X 2) w Reflex to ID Panel     Status: None   Collection Time: 07/22/15  1:33 PM  Result Value Ref Range Status   Specimen Description BLOOD RIGHT ANTECUBITAL  Final   Special Requests BOTTLES DRAWN AEROBIC AND ANAEROBIC  5CC  Final   Culture NO GROWTH 5 DAYS  Final   Report Status 07/27/2015 FINAL  Final  CULTURE, BLOOD (ROUTINE X 2) w Reflex to ID Panel     Status: None   Collection Time: 07/22/15  1:33 PM  Result Value Ref Range Status   Specimen Description BLOOD LEFT HAND  Final   Special Requests BOTTLES DRAWN AEROBIC AND ANAEROBIC  4CC  Final   Culture NO GROWTH 5 DAYS  Final   Report Status 07/27/2015 FINAL  Final  CULTURE, BLOOD (ROUTINE X 2) w Reflex to ID Panel     Status: None (Preliminary result)   Collection Time: 07/26/15  7:54 PM  Result Value Ref Range Status   Specimen Description BLOOD RIGHT ASSIST CONTROL  Final   Special Requests   Final    BOTTLES DRAWN AEROBIC AND ANAEROBIC  AERO 6CC ANA 3CC   Culture NO GROWTH 2 DAYS  Final   Report Status PENDING  Incomplete  CULTURE, BLOOD (ROUTINE X 2) w Reflex to ID Panel     Status: None (Preliminary result)   Collection Time: 07/26/15  7:54 PM  Result Value Ref Range Status   Specimen Description BLOOD LEFT WRIST  Final   Special Requests   Final    BOTTLES DRAWN AEROBIC AND ANAEROBIC  AERO 5CC ANA 3CC   Culture NO GROWTH 2 DAYS  Final   Report Status PENDING  Incomplete  MRSA PCR Screening  Status: None   Collection Time: 07/26/15  9:08 PM  Result Value Ref Range Status   MRSA by PCR NEGATIVE NEGATIVE Final    Comment:        The GeneXpert MRSA Assay (FDA approved for NASAL specimens only), is one component of a comprehensive MRSA colonization surveillance program. It is not intended to diagnose  MRSA infection nor to guide or monitor treatment for MRSA infections.     RADIOLOGY:  Dg Chest 1 View  07/25/2015  CLINICAL DATA:  Fever EXAM: CHEST 1 VIEW COMPARISON:  07/21/2015 FINDINGS: Stable mild cardiac enlargement. Vascular pattern normal. Opacity retrocardiac left lower lobe. Right lung clear. Incidental aortic calcific atherosclerosis. IMPRESSION: Left lower lobe infiltrate. Incidental aortic calcific atherosclerosis. Electronically Signed   By: Esperanza Heir M.D.   On: 07/25/2015 10:26   Dg Chest 2 View  07/26/2015  CLINICAL DATA:  Fever falling admission. EXAM: CHEST  2 VIEW COMPARISON:  07/25/2015 FINDINGS: Opacity at the left lung base is stable from the earlier study. There is a small associated pleural effusion. Remainder of the lungs is clear. Cardiac silhouette is mildly enlarged. No mediastinal or hilar masses or convincing adenopathy. No pneumothorax. Bony thorax is demineralized but grossly intact. IMPRESSION: 1. No change in the opacity at the left lung base from the study obtained yesterday. When compared with exam dated 02/08/2015, there were larger pleural effusions and a greater degree of left lung base opacity. Opacity at the left lung base on the current exam may be chronic or reflect pneumonia or a combination. Electronically Signed   By: Amie Portland M.D.   On: 07/26/2015 19:11   Dg Hip Port Unilat With Pelvis 1v Right  07/26/2015  CLINICAL DATA:  Hip fracture EXAM: DG HIP (WITH OR WITHOUT PELVIS) 1V PORT RIGHT COMPARISON:  07/21/2015 FINDINGS: Two views of the right hip submitted including frontal view of the pelvis. The patient is status post right femoral neck fracture repair. There is a prosthesis in proximal right femur with anatomic alignment. IMPRESSION: Right hip femoral prosthesis with anatomic alignment. Electronically Signed   By: Natasha Mead M.D.   On: 07/26/2015 13:37    EKG:   Orders placed or performed during the hospital encounter of 07/21/15  .  EKG 12-Lead  . EKG 12-Lead  . ED EKG  . ED EKG      Management plans discussed with the patient, family and they are in agreement.  CODE STATUS:     Code Status Orders        Start     Ordered   07/26/15 1410  Full code   Continuous     07/26/15 1409    Code Status History    Date Active Date Inactive Code Status Order ID Comments User Context   07/22/2015  3:22 AM 07/24/2015  5:35 PM Full Code [id number]  Juanell Fairly, MD Inpatient   07/22/2015  1:59 AM 07/22/2015  1:59 AM Full Code [id number]  Joella Prince, MD ED   07/22/2015  1:59 AM 07/22/2015  3:22 AM Full Code [id number]  Joella Prince, MD ED   02/09/2015 11:52 AM 02/09/2015  6:06 PM DNR [id number]  Gale Journey, MD Inpatient   02/05/2015  8:28 PM 02/09/2015 11:52 AM Full Code [id number]  Wyatt Haste, MD ED      TOTAL TIME TAKING CARE OF THIS PATIENT: 45  minutes.   Note: This dictation was prepared with Dragon dictation along with smaller phrase technology. Any transcriptional errors  that result from this process are unintentional.   @[facility] @  on 07/29/2015 at 9:48 AM  Between 7am to 6pm - Pager - [phone]  After 6pm go to [email or web address] - password [email or web address] [facility]  [facility]  Office  [phone]  CC: Primary care physician; Rolm Gala, MD

## 2015-07-28 NOTE — Discharge Instructions (Signed)
Activity as tolerated per PT recommendations Diet regular -Follow up with primary care physician at the facility in 3 days  follow-up with Dr. Martha ClanKrasinski in 2 weeks and  follow-up with GI as needed or as recommended

## 2015-07-28 NOTE — Consult Note (Signed)
[facility] Infectious Disease     Reason for Consult: Fever, sepsis    Referring Physician:  Nicholes Mango Date of Admission:  07/21/2015   Active Problems:   Closed right hip fracture (HCC)   Hip fracture (HCC)   Pressure ulcer   HPI: [name] is a 82 y.o. female admitted 6/16 with hip fx then when went for surgery had massive Diarrhea. Was seen by GI started cipro flagyl - no futher diarrhea, then had surgery.  On admit day has low grade fever 100.6 but then 6/21 developed temp to 103.3. CXR showed new infiltrate (on admission clear) , transferred to ICU.   Abx cipro 6/19-6/20 Cefepime 6/21--> Flagyl 6/19-> Vanco 6/21-> Past Medical History  Diagnosis Date  . Vaginal bleeding   . Urethra, polyp   . Blood in stool   . Hematuria   . IBS (irritable bowel syndrome)   . Eczema   . Insomnia   . Osteopenia   . Anterolisthesis   . Dementia   . LVH (left ventricular hypertrophy) due to hypertensive disease   . Hypertension    Past Surgical History  Procedure Laterality Date  . Hip arthroplasty Right 07/26/2015    Procedure: ARTHROPLASTY BIPOLAR HIP (HEMIARTHROPLASTY);  Surgeon: Thornton Park, MD;  Location: [facility] ORS;  Service: Orthopedics;  Laterality: Right;   Social History  Substance Use Topics  . Smoking status: Never Smoker   . Smokeless tobacco: None  . Alcohol Use: No   History reviewed. No pertinent family history.  Allergies:  Allergies  Allergen Reactions  . Azithromycin Itching  . Bactrim [Sulfamethoxazole-Trimethoprim]   . Other Nausea And Vomiting  . Penicillins Swelling    Has patient had a PCN reaction causing immediate rash, facial/tongue/throat swelling, SOB or lightheadedness with hypotension: Yes Has patient had a PCN reaction causing severe rash involving mucus membranes or skin necrosis: No Has patient had a PCN reaction that required hospitalization No Has patient had a PCN reaction occurring within the last 10 years: Yes If all of  the above answers are "NO", then may proceed with Cephalosporin use.  . Sulfa Antibiotics   . Vibramycin [Doxycycline Calcium] Other (See Comments)    Reaction : Unknown    Current antibiotics: Antibiotics Given (last 72 hours)    Date/Time Action Medication Dose Rate   07/25/15 1841 Given   metroNIDAZOLE (FLAGYL) IVPB 500 mg 500 mg 100 mL/hr   07/25/15 2006 Given   ciprofloxacin (CIPRO) IVPB 400 mg 400 mg 200 mL/hr   07/26/15 0018 Given   metroNIDAZOLE (FLAGYL) IVPB 500 mg 500 mg 100 mL/hr   07/26/15 1101 Given   [MAR Hold] clindamycin (CLEOCIN) IVPB 600 mg (MAR Hold since 07/26/15 0859) 600 mg    07/26/15 1430 Given   clindamycin (CLEOCIN) IVPB 600 mg 600 mg 100 mL/hr   07/26/15 1820 Given   metroNIDAZOLE (FLAGYL) IVPB 500 mg 500 mg 100 mL/hr   07/26/15 1950 Given   vancomycin (VANCOCIN) IVPB 1000 mg/200 mL premix 1,000 mg 200 mL/hr   07/26/15 2121 Given   ceFEPIme (MAXIPIME) 1 g in dextrose 5 % 50 mL IVPB 1 g 100 mL/hr   07/26/15 2122 Given   clindamycin (CLEOCIN) IVPB 600 mg 600 mg 100 mL/hr   07/27/15 9024 Given   ceFEPIme (MAXIPIME) 1 g in dextrose 5 % 50 mL IVPB 1 g 100 mL/hr   07/27/15 1135 Given   vancomycin (VANCOCIN) IVPB 1000 mg/200 mL premix 1,000 mg 200 mL/hr   07/27/15 1708  Given   metroNIDAZOLE (FLAGYL) IVPB 500 mg 500 mg 100 mL/hr   07/27/15 2033 Given   ceFEPIme (MAXIPIME) 1 g in dextrose 5 % 50 mL IVPB 1 g 100 mL/hr   07/28/15 0130 Given   metroNIDAZOLE (FLAGYL) IVPB 500 mg 500 mg 100 mL/hr   07/28/15 0723 Given   ceFEPIme (MAXIPIME) 1 g in dextrose 5 % 50 mL IVPB 1 g 100 mL/hr   07/28/15 0723 Given   metroNIDAZOLE (FLAGYL) IVPB 500 mg 500 mg 100 mL/hr   07/28/15 1310 Given  [awaiting medication from pharmacy]   vancomycin (VANCOCIN) IVPB 1000 mg/200 mL premix 1,000 mg 200 mL/hr   07/28/15 1532 Given   metroNIDAZOLE (FLAGYL) IVPB 500 mg 500 mg 100 mL/hr      MEDICATIONS: . apixaban  5 mg Oral BID  . aspirin EC  81 mg Oral Once  . ceFEPime  (MAXIPIME) IV  1 g Intravenous Q12H  . ferrous sulfate  325 mg Oral TID PC  . folic acid  1 mg Oral Daily  . ketoconazole  1 application Topical Daily  . Lifitegrast  1 drop Both Eyes BID  . memantine  5 mg Oral BID  . metronidazole  500 mg Intravenous Q8H  . multivitamin with minerals  1 tablet Oral Daily  . PARoxetine  10 mg Oral Daily  . QUEtiapine  25 mg Oral QHS  . saccharomyces boulardii  250 mg Oral BID  . senna  1 tablet Oral BID  . vancomycin  1,000 mg Intravenous Q24H    Review of Systems - 11 systems reviewed and negative per HPI   OBJECTIVE: Temp:  [98.2 F (36.8 C)-99.2 F (37.3 C)] 98.2 F (36.8 C) (06/23 1200) Pulse Rate:  [78-93] 79 (06/23 1500) Resp:  [14-35] 20 (06/23 1500) BP: (77-136)/(49-89) 129/65 mmHg (06/23 1500) SpO2:  [91 %-99 %] 97 % (06/23 1500) Physical Exam  Constitutional:  Frail, oriented to person, place, and time.  HENT: [place]/AT, PERRLA, no scleral icterus Mouth/Throat: Oropharynx is clear and moist. No oropharyngeal exudate.  Cardiovascular: Normal rate, regular rhythm and normal heart sounds.  Pulmonary/Chest: bibasilar rhonchi Neck = supple, no nuchal rigidity Abdominal: Soft. Bowel sounds are normal.  exhibits no distension. There is no tenderness.  Lymphadenopathy: no cervical adenopathy. No axillary adenopathy Neurological: alert but a ltitle confused Foley in place Skin: R hip incision covered by honeycomb dressing, min darainge Psychiatric: a normal mood and affect.  behavior is normal.    LABS: Results for orders placed or performed during the hospital encounter of 07/21/15 (from the past 48 hour(s))  CBC     Status: Abnormal   Collection Time: 07/26/15  7:52 PM  Result Value Ref Range   WBC 8.2 3.6 - 11.0 K/uL   RBC 3.40 (L) 3.80 - 5.20 MIL/uL   Hemoglobin 9.5 (L) 12.0 - 16.0 g/dL   HCT 28.7 (L) 35.0 - 47.0 %   MCV 84.5 80.0 - 100.0 fL   MCH 28.1 26.0 - 34.0 pg   MCHC 33.2 32.0 - 36.0 g/dL   RDW 15.6 (H) 11.5 - 14.5 %    Platelets 203 150 - 440 K/uL  Lactic acid, plasma     Status: None   Collection Time: 07/26/15  7:52 PM  Result Value Ref Range   Lactic Acid, Venous 1.1 0.5 - 2.0 mmol/L  Urinalysis complete, with microscopic ([facility] only)     Status: Abnormal   Collection Time: 07/26/15  7:53 PM  Result Value Ref Range  Color, Urine YELLOW (A) YELLOW   APPearance CLEAR (A) CLEAR   Glucose, UA NEGATIVE NEGATIVE mg/dL   Bilirubin Urine NEGATIVE NEGATIVE   Ketones, ur 1+ (A) NEGATIVE mg/dL   Specific Gravity, Urine 1.015 1.005 - 1.030   Hgb urine dipstick NEGATIVE NEGATIVE   pH 5.0 5.0 - 8.0   Protein, ur NEGATIVE NEGATIVE mg/dL   Nitrite NEGATIVE NEGATIVE   Leukocytes, UA 1+ (A) NEGATIVE   RBC / HPF 0-5 0 - 5 RBC/hpf   WBC, UA 6-30 0 - 5 WBC/hpf   Bacteria, UA NONE SEEN NONE SEEN   Squamous Epithelial / LPF NONE SEEN NONE SEEN   Mucous PRESENT   CULTURE, BLOOD (ROUTINE X 2) w Reflex to ID Panel     Status: None (Preliminary result)   Collection Time: 07/26/15  7:54 PM  Result Value Ref Range   Specimen Description BLOOD RIGHT ASSIST CONTROL    Special Requests      BOTTLES DRAWN AEROBIC AND ANAEROBIC  AERO 6CC ANA 3CC   Culture NO GROWTH 2 DAYS    Report Status PENDING   CULTURE, BLOOD (ROUTINE X 2) w Reflex to ID Panel     Status: None (Preliminary result)   Collection Time: 07/26/15  7:54 PM  Result Value Ref Range   Specimen Description BLOOD LEFT WRIST    Special Requests      BOTTLES DRAWN AEROBIC AND ANAEROBIC  AERO 5CC ANA 3CC   Culture NO GROWTH 2 DAYS    Report Status PENDING   Glucose, capillary     Status: Abnormal   Collection Time: 07/26/15  9:01 PM  Result Value Ref Range   Glucose-Capillary 134 (H) 65 - 99 mg/dL  MRSA PCR Screening     Status: None   Collection Time: 07/26/15  9:08 PM  Result Value Ref Range   MRSA by PCR NEGATIVE NEGATIVE    Comment:        The GeneXpert MRSA Assay (FDA approved for NASAL specimens only), is one component of a comprehensive MRSA  colonization surveillance program. It is not intended to diagnose MRSA infection nor to guide or monitor treatment for MRSA infections.   Lactic acid, plasma     Status: None   Collection Time: 07/26/15 10:43 PM  Result Value Ref Range   Lactic Acid, Venous 1.6 0.5 - 2.0 mmol/L  CBC     Status: Abnormal   Collection Time: 07/27/15  5:12 AM  Result Value Ref Range   WBC 9.6 3.6 - 11.0 K/uL   RBC 3.40 (L) 3.80 - 5.20 MIL/uL   Hemoglobin 9.6 (L) 12.0 - 16.0 g/dL   HCT 28.4 (L) 35.0 - 47.0 %   MCV 83.4 80.0 - 100.0 fL   MCH 28.4 26.0 - 34.0 pg   MCHC 34.0 32.0 - 36.0 g/dL   RDW 15.8 (H) 11.5 - 14.5 %   Platelets 216 150 - 440 K/uL  Basic metabolic panel     Status: Abnormal   Collection Time: 07/27/15  5:12 AM  Result Value Ref Range   Sodium 141 135 - 145 mmol/L   Potassium 3.4 (L) 3.5 - 5.1 mmol/L   Chloride 114 (H) 101 - 111 mmol/L   CO2 21 (L) 22 - 32 mmol/L   Glucose, Bld 122 (H) 65 - 99 mg/dL   BUN 17 6 - 20 mg/dL   Creatinine, Ser 0.73 0.44 - 1.00 mg/dL   Calcium 7.7 (L) 8.9 - 10.3 mg/dL   GFR  calc non Af Amer >60 >60 mL/min   GFR calc Af Amer >60 >60 mL/min    Comment: (NOTE) The eGFR has been calculated using the CKD EPI equation. This calculation has not been validated in all clinical situations. eGFR's persistently <60 mL/min signify possible Chronic Kidney Disease.    Anion gap 6 5 - 15  Basic metabolic panel     Status: Abnormal   Collection Time: 07/28/15  6:35 AM  Result Value Ref Range   Sodium 137 135 - 145 mmol/L   Potassium 2.9 (LL) 3.5 - 5.1 mmol/L    Comment: CRITICAL RESULT CALLED TO, READ BACK BY AND VERIFIED WITH SABRINA ELLOTT @ 0713 07/28/15 BY [facility]    Chloride 112 (H) 101 - 111 mmol/L   CO2 20 (L) 22 - 32 mmol/L   Glucose, Bld 109 (H) 65 - 99 mg/dL   BUN 19 6 - 20 mg/dL   Creatinine, Ser 0.67 0.44 - 1.00 mg/dL   Calcium 7.9 (L) 8.9 - 10.3 mg/dL   GFR calc non Af Amer >60 >60 mL/min   GFR calc Af Amer >60 >60 mL/min    Comment:  (NOTE) The eGFR has been calculated using the CKD EPI equation. This calculation has not been validated in all clinical situations. eGFR's persistently <60 mL/min signify possible Chronic Kidney Disease.    Anion gap 5 5 - 15  CBC with Differential/Platelet     Status: Abnormal   Collection Time: 07/28/15  6:35 AM  Result Value Ref Range   WBC 11.1 (H) 3.6 - 11.0 K/uL   RBC 3.36 (L) 3.80 - 5.20 MIL/uL   Hemoglobin 9.1 (L) 12.0 - 16.0 g/dL   HCT 27.8 (L) 35.0 - 47.0 %   MCV 82.8 80.0 - 100.0 fL   MCH 27.2 26.0 - 34.0 pg   MCHC 32.9 32.0 - 36.0 g/dL   RDW 16.1 (H) 11.5 - 14.5 %   Platelets 260 150 - 440 K/uL   Neutrophils Relative % 78% %   Neutro Abs 8.6 (H) 1.4 - 6.5 K/uL   Lymphocytes Relative 8% %   Lymphs Abs 0.9 (L) 1.0 - 3.6 K/uL   Monocytes Relative 11% %   Monocytes Absolute 1.2 (H) 0.2 - 0.9 K/uL   Eosinophils Relative 3% %   Eosinophils Absolute 0.4 0 - 0.7 K/uL   Basophils Relative 0% %   Basophils Absolute 0.0 0 - 0.1 K/uL  Magnesium     Status: Abnormal   Collection Time: 07/28/15  6:35 AM  Result Value Ref Range   Magnesium 1.5 (L) 1.7 - 2.4 mg/dL  Phosphorus     Status: None   Collection Time: 07/28/15  6:35 AM  Result Value Ref Range   Phosphorus 2.6 2.5 - 4.6 mg/dL   No components found for: ESR, C REACTIVE PROTEIN MICRO: Recent Results (from the past 720 hour(s))  Urine culture     Status: None   Collection Time: 07/22/15  4:20 AM  Result Value Ref Range Status   Specimen Description URINE, CLEAN CATCH  Final   Special Requests NONE  Final   Culture NO GROWTH Performed at [facility]   Final   Report Status 07/23/2015 FINAL  Final  Surgical pcr screen     Status: Abnormal   Collection Time: 07/22/15  4:20 AM  Result Value Ref Range Status   MRSA, PCR NEGATIVE NEGATIVE Final   Staphylococcus aureus POSITIVE (A) NEGATIVE Final    Comment:  The Xpert SA Assay (FDA approved for NASAL specimens in patients over 13 years of age), is  one component of a comprehensive surveillance program.  Test performance has been validated by [facility] for patients greater than or equal to 79 year old. It is not intended to diagnose infection nor to guide or monitor treatment. CRITICAL RESULT CALLED TO, READ BACK BY AND VERIFIED WITH: ANESSA MACRHON @ 3790 07/22/15 BY [facility]   CULTURE, BLOOD (ROUTINE X 2) w Reflex to ID Panel     Status: None   Collection Time: 07/22/15  1:33 PM  Result Value Ref Range Status   Specimen Description BLOOD RIGHT ANTECUBITAL  Final   Special Requests BOTTLES DRAWN AEROBIC AND ANAEROBIC  5CC  Final   Culture NO GROWTH 5 DAYS  Final   Report Status 07/27/2015 FINAL  Final  CULTURE, BLOOD (ROUTINE X 2) w Reflex to ID Panel     Status: None   Collection Time: 07/22/15  1:33 PM  Result Value Ref Range Status   Specimen Description BLOOD LEFT HAND  Final   Special Requests BOTTLES DRAWN AEROBIC AND ANAEROBIC  4CC  Final   Culture NO GROWTH 5 DAYS  Final   Report Status 07/27/2015 FINAL  Final  CULTURE, BLOOD (ROUTINE X 2) w Reflex to ID Panel     Status: None (Preliminary result)   Collection Time: 07/26/15  7:54 PM  Result Value Ref Range Status   Specimen Description BLOOD RIGHT ASSIST CONTROL  Final   Special Requests   Final    BOTTLES DRAWN AEROBIC AND ANAEROBIC  AERO 6CC ANA 3CC   Culture NO GROWTH 2 DAYS  Final   Report Status PENDING  Incomplete  CULTURE, BLOOD (ROUTINE X 2) w Reflex to ID Panel     Status: None (Preliminary result)   Collection Time: 07/26/15  7:54 PM  Result Value Ref Range Status   Specimen Description BLOOD LEFT WRIST  Final   Special Requests   Final    BOTTLES DRAWN AEROBIC AND ANAEROBIC  AERO 5CC ANA 3CC   Culture NO GROWTH 2 DAYS  Final   Report Status PENDING  Incomplete  MRSA PCR Screening     Status: None   Collection Time: 07/26/15  9:08 PM  Result Value Ref Range Status   MRSA by PCR NEGATIVE NEGATIVE Final    Comment:        The GeneXpert MRSA Assay  (FDA approved for NASAL specimens only), is one component of a comprehensive MRSA colonization surveillance program. It is not intended to diagnose MRSA infection nor to guide or monitor treatment for MRSA infections.     IMAGING: Dg Chest 1 View  07/25/2015  CLINICAL DATA:  Fever EXAM: CHEST 1 VIEW COMPARISON:  07/21/2015 FINDINGS: Stable mild cardiac enlargement. Vascular pattern normal. Opacity retrocardiac left lower lobe. Right lung clear. Incidental aortic calcific atherosclerosis. IMPRESSION: Left lower lobe infiltrate. Incidental aortic calcific atherosclerosis. Electronically Signed   By: Skipper Cliche M.D.   On: 07/25/2015 10:26   Dg Chest 1 View  07/22/2015  CLINICAL DATA:  Preoperative chest radiograph. EXAM: CHEST 1 VIEW COMPARISON:  None. FINDINGS: The heart size and mediastinal contours are within normal limits. Both lungs are clear. The visualized skeletal structures are unremarkable. IMPRESSION: No active disease. Electronically Signed   By: Fidela Salisbury M.D.   On: 07/22/2015 00:00   Dg Chest 2 View  07/26/2015  CLINICAL DATA:  Fever falling admission. EXAM: CHEST  2 VIEW  COMPARISON:  07/25/2015 FINDINGS: Opacity at the left lung base is stable from the earlier study. There is a small associated pleural effusion. Remainder of the lungs is clear. Cardiac silhouette is mildly enlarged. No mediastinal or hilar masses or convincing adenopathy. No pneumothorax. Bony thorax is demineralized but grossly intact. IMPRESSION: 1. No change in the opacity at the left lung base from the study obtained yesterday. When compared with exam dated 02/08/2015, there were larger pleural effusions and a greater degree of left lung base opacity. Opacity at the left lung base on the current exam may be chronic or reflect pneumonia or a combination. Electronically Signed   By: Lajean Manes M.D.   On: 07/26/2015 19:11   Dg Knee 1-2 Views Right  07/22/2015  CLINICAL DATA:  Right hip and right  knee pain after a fall. EXAM: RIGHT KNEE - 1-2 VIEW COMPARISON:  None. FINDINGS: Degenerative changes in the right knee with medial greater than lateral compartment narrowing and tricompartment osteophyte formation. There is a small right knee effusion without hemarthrosis. No evidence of acute fracture or dislocation. No focal bone lesion or bone destruction. Vascular calcifications. IMPRESSION: Moderate tricompartment degenerative changes in the right knee. Small right knee effusion. No acute displaced fractures identified. Electronically Signed   By: Lucienne Capers M.D.   On: 07/22/2015 00:01   Ct Head Wo Contrast  07/22/2015  CLINICAL DATA:  Status post fall. EXAM: CT HEAD WITHOUT CONTRAST TECHNIQUE: Contiguous axial images were obtained from the base of the skull through the vertex without intravenous contrast. COMPARISON:  01/15/2012 FINDINGS: Brain: No evidence of acute infarction, hemorrhage, extra-axial collection, ventriculomegaly, or mass effect. There is moderate brain parenchymal volume loss. Extensive microvascular ischemic changes are also seen. Vascular: No hyperdense vessel or unexpected calcification. Skull: Negative for fracture or focal lesion. Sinuses/Orbits: Mild opacification of the left sphenoid sinus, otherwise no acute findings. Other: None. IMPRESSION: No acute intracranial abnormality. Atrophy, chronic microvascular disease. Electronically Signed   By: Fidela Salisbury M.D.   On: 07/22/2015 00:17   Dg Hip Port Unilat With Pelvis 1v Right  07/26/2015  CLINICAL DATA:  Hip fracture EXAM: DG HIP (WITH OR WITHOUT PELVIS) 1V PORT RIGHT COMPARISON:  07/21/2015 FINDINGS: Two views of the right hip submitted including frontal view of the pelvis. The patient is status post right femoral neck fracture repair. There is a prosthesis in proximal right femur with anatomic alignment. IMPRESSION: Right hip femoral prosthesis with anatomic alignment. Electronically Signed   By: Lahoma Crocker M.D.    On: 07/26/2015 13:37   Dg Hip Unilat With Pelvis 2-3 Views Right  07/22/2015  CLINICAL DATA:  Mechanical fall. Right hip and right knee pain. Shortening and external rotation. EXAM: DG HIP (WITH OR WITHOUT PELVIS) 2-3V RIGHT COMPARISON:  None. FINDINGS: Acute transverse fracture of the right subcapital femoral neck with superior subluxation of the distal fracture fragment resulting in varus angulation of the fracture fragments. The pelvis appears intact. SI joints and symphysis pubis are not displaced. Mild degenerative changes in the lower lumbar spine and hips. Soft tissue calcifications likely represent injection granulomas. IMPRESSION: Acute transverse fracture of the subcapital right femoral neck with varus angulation. Electronically Signed   By: Lucienne Capers M.D.   On: 07/22/2015 00:00    Assessment:   AFTIN LYE is a 82 y.o. female  With post op fever, following surgical repair of R hip fracture. CXR shows infiltrate R base. Improving but still on O2. Had issues with diarrhea but  resolved and has known IBS.  Incision seems fine, UA good.  Recommendations Continue cefepime and flagyl DC vanco - MRSA PCR negative At dc send on levo and flagyl for total 7 days from 6/21  Will sign off now but please call with questions.  Thank you very much for allowing me to participate in the care of this patient.  Cheral Marker. Ola Spurr, MD

## 2015-07-28 NOTE — Progress Notes (Signed)
OT Cancellation Note  Patient Details Name: Harvel QualeDorothy L Helfman MRN: [id number] DOB: 07/04/1924   Cancelled Treatment:    Reason Eval/Treat Not Completed: Medical issues which prohibited therapy (Potassium is at critical level of 2.9 which is contraindicated for skilled OT services. Will continue to monitor and eval as appropriate.)   Olegario MessierElaine Lashica Hannay, MS, OTR/L   Olegario Messierlaine Marquee Fuchs 07/28/2015, 9:03 AM

## 2015-07-28 NOTE — Progress Notes (Signed)
Lab called with critical potassium level of 2.9. Dr. Amado CoeGouru notified and ordered 40 mEQ oral and 10 mEQ x4 IV

## 2015-07-28 NOTE — Progress Notes (Signed)
Physical Therapy Treatment Patient Details Name: Colleen QualeDorothy L Molony MRN: [id number] DOB: 11/23/1924 Today's Date: 07/28/2015    History of Present Illness 82 y/o s/p R hip hemiarthroplasty.  Pt with complications and transfered to CCU.    PT Comments    Pt continues to be very weak and fatigues quickly with light bed exercises but despite her pain and fatigue she shows good effort.  Her O2 and HR remain stable t/o the session, but she becomes more and more short of breath and tired with increased exercises.  Pt with more pain sensitivity with activities this afternoon and she does show considerable hesitancy with most acts (more than this AM).  Follow Up Recommendations  SNF     Equipment Recommendations       Recommendations for Other Services       Precautions / Restrictions Precautions Precautions: Fall;Posterior Hip Restrictions Weight Bearing Restrictions: Yes RLE Weight Bearing: Weight bearing as tolerated    Mobility  Bed Mobility               General bed mobility comments: deferred, pt's potassium still low and pt was relatively lethargic   Transfers                    Ambulation/Gait                 Stairs            Wheelchair Mobility    Modified Rankin (Stroke Patients Only)       Balance                                    Cognition Arousal/Alertness: Lethargic Behavior During Therapy: WFL for tasks assessed/performed Overall Cognitive Status: Within Functional Limits for tasks assessed                      Exercises Total Joint Exercises Ankle Circles/Pumps: AAROM;PROM;10 reps Quad Sets: 10 reps;Strengthening Gluteal Sets: Strengthening;10 reps Short Arc Quad: AROM;AAROM;10 reps Heel Slides: AAROM;PROM;5 reps Hip ABduction/ADduction: AROM;AAROM;10 reps    General Comments        Pertinent Vitals/Pain Pain Score: 8  (more pain sensitive this afternoon )    Home Living                       Prior Function            PT Goals (current goals can now be found in the care plan section)      Frequency  BID    PT Plan Current plan remains appropriate    Co-evaluation             End of Session Equipment Utilized During Treatment: Oxygen Activity Tolerance: Patient limited by pain;Patient limited by fatigue Patient left: in bed;with call bell/phone within reach;with family/visitor present     Time: [id number] PT Time Calculation (min) (ACUTE ONLY): 9 min  Charges:  $Therapeutic Exercise: 8-22 mins                    G Codes:      Malachi ProGalen R Amir Glaus, DPT 07/28/2015, 5:26 PM

## 2015-07-28 NOTE — Progress Notes (Signed)
Plan is for patient to D/C to [facility] tomorrow 07/29/15 pending medical clearance. Per Sue LushAndrea admissions coordinator at [facility] patient will go to room 326. RN will call report at [phone]) [phone]. Clinical Child psychotherapistocial Worker (CSW) sent D/C Summary, FL2 and D/C Packet to [employer] via [employer] today. CSW contacted patient's daughter Elita Quickam and made her aware of above. CSW will continue to follow and assist as needed.   Jetta LoutBailey Morgan, LCSW [phone]

## 2015-07-28 NOTE — Progress Notes (Signed)
Subjective:  POD #2 s/p right hip hemiarthroplasty.  Patient reports pain as mild to moderate.  Family members at the bedside. Patient remains in ICU bed 14. Patient is awaiting a bed on the floor.  Objective:   VITALS:   Filed Vitals:   07/28/15 1400 07/28/15 1500 07/28/15 1600 07/28/15 1630  BP: 123/65 129/65 131/62   Pulse: 78 79 79 79  Temp:      TempSrc:      Resp: 21 20 21 26   Height:      Weight:      SpO2: 97% 97% 98% 92%    PHYSICAL EXAM:  Right lower extremity:  Patient's dressing remains clean dry and intact. There is no significant thigh swelling and thigh compartments are soft and compressible. She has intact sensation light touch in palpable pedal pulses. She can flex and extend her toes and dorsiflex and plantarflex her ankle.  LABS  Results for orders placed or performed during the hospital encounter of 07/21/15 (from the past 24 hour(s))  Basic metabolic panel     Status: Abnormal   Collection Time: 07/28/15  6:35 AM  Result Value Ref Range   Sodium 137 135 - 145 mmol/L   Potassium 2.9 (LL) 3.5 - 5.1 mmol/L   Chloride 112 (H) 101 - 111 mmol/L   CO2 20 (L) 22 - 32 mmol/L   Glucose, Bld 109 (H) 65 - 99 mg/dL   BUN 19 6 - 20 mg/dL   Creatinine, Ser 9.560.67 0.44 - 1.00 mg/dL   Calcium 7.9 (L) 8.9 - 10.3 mg/dL   GFR calc non Af Amer >60 >60 mL/min   GFR calc Af Amer >60 >60 mL/min   Anion gap 5 5 - 15  CBC with Differential/Platelet     Status: Abnormal   Collection Time: 07/28/15  6:35 AM  Result Value Ref Range   WBC 11.1 (H) 3.6 - 11.0 K/uL   RBC 3.36 (L) 3.80 - 5.20 MIL/uL   Hemoglobin 9.1 (L) 12.0 - 16.0 g/dL   HCT 38.727.8 (L) 56.435.0 - 33.247.0 %   MCV 82.8 80.0 - 100.0 fL   MCH 27.2 26.0 - 34.0 pg   MCHC 32.9 32.0 - 36.0 g/dL   RDW 95.116.1 (H) 88.411.5 - 16.614.5 %   Platelets 260 150 - 440 K/uL   Neutrophils Relative % 78% %   Neutro Abs 8.6 (H) 1.4 - 6.5 K/uL   Lymphocytes Relative 8% %   Lymphs Abs 0.9 (L) 1.0 - 3.6 K/uL   Monocytes Relative 11% %   Monocytes  Absolute 1.2 (H) 0.2 - 0.9 K/uL   Eosinophils Relative 3% %   Eosinophils Absolute 0.4 0 - 0.7 K/uL   Basophils Relative 0% %   Basophils Absolute 0.0 0 - 0.1 K/uL  Magnesium     Status: Abnormal   Collection Time: 07/28/15  6:35 AM  Result Value Ref Range   Magnesium 1.5 (L) 1.7 - 2.4 mg/dL  Phosphorus     Status: None   Collection Time: 07/28/15  6:35 AM  Result Value Ref Range   Phosphorus 2.6 2.5 - 4.6 mg/dL    Dg Chest 2 View  0/63/01606/21/2017  CLINICAL DATA:  Fever falling admission. EXAM: CHEST  2 VIEW COMPARISON:  07/25/2015 FINDINGS: Opacity at the left lung base is stable from the earlier study. There is a small associated pleural effusion. Remainder of the lungs is clear. Cardiac silhouette is mildly enlarged. No mediastinal or hilar masses or convincing adenopathy. No  pneumothorax. Bony thorax is demineralized but grossly intact. IMPRESSION: 1. No change in the opacity at the left lung base from the study obtained yesterday. When compared with exam dated 02/08/2015, there were larger pleural effusions and a greater degree of left lung base opacity. Opacity at the left lung base on the current exam may be chronic or reflect pneumonia or a combination. Electronically Signed   By: Amie Portlandavid  Ormond M.D.   On: 07/26/2015 19:11    Assessment/Plan: 2 Days Post-Op   Active Problems:   Closed right hip fracture (HCC)   Hip fracture (HCC)   Pressure ulcer   Patient will continue physical therapy and progress with PT as medically indicated. Patient may be discharged to skilled nursing facility when medically stable. She will be weightbearing as tolerated on the right lower extremity. She needs to observe posterior hip precautions. She is restarted on her Eliquis which will cover her for DVT prophylaxis.  Patient will follow up with me in the office in 10-14 days.   Juanell FairlyKRASINSKI, Edman Lipsey , MD 07/28/2015, 5:44 PM

## 2015-07-28 NOTE — Consult Note (Signed)
Since she is being discharged tomorrow and I agree with Dr. Jarrett AblesFitzgerald's choice of antibiotics I will sign off.

## 2015-07-29 LAB — BASIC METABOLIC PANEL
Anion gap: 5 (ref 5–15)
BUN: 15 mg/dL (ref 6–20)
CHLORIDE: 112 mmol/L — AB (ref 101–111)
CO2: 20 mmol/L — AB (ref 22–32)
CREATININE: 0.72 mg/dL (ref 0.44–1.00)
Calcium: 7.7 mg/dL — ABNORMAL LOW (ref 8.9–10.3)
GFR calc Af Amer: >60 mL/min (ref >60)
GFR calc non Af Amer: >60 mL/min (ref >60)
GLUCOSE: 100 mg/dL — AB (ref 65–99)
Potassium: 3.2 mmol/L — ABNORMAL LOW (ref 3.5–5.1)
SODIUM: 137 mmol/L (ref 135–145)

## 2015-07-29 LAB — MAGNESIUM: MAGNESIUM: 1.8 mg/dL (ref 1.7–2.4)

## 2015-07-29 LAB — HEMOGLOBIN: HEMOGLOBIN: 8.7 g/dL — AB (ref 12.0–16.0)

## 2015-07-29 MED ORDER — POTASSIUM CHLORIDE CRYS ER 10 MEQ PO TBCR: 10.0000 meq | EXTENDED_RELEASE_TABLET | Freq: Every day | ORAL | Status: AC

## 2015-07-29 MED ORDER — POTASSIUM CHLORIDE CRYS ER 10 MEQ PO TBCR: 10.0000 meq | EXTENDED_RELEASE_TABLET | Freq: Every day | ORAL | Status: DC

## 2015-07-29 MED ORDER — MAGNESIUM SULFATE 2 GM/50ML IV SOLN
2.0000 g | Freq: Once | INTRAVENOUS | Status: AC
  Administered 2015-07-29: 2 g via INTRAVENOUS
  Filled 2015-07-29: qty 50

## 2015-07-29 MED ORDER — POTASSIUM CHLORIDE CRYS ER 20 MEQ PO TBCR
40.0000 meq | EXTENDED_RELEASE_TABLET | Freq: Once | ORAL | Status: AC
  Administered 2015-07-29: 40 meq via ORAL
  Filled 2015-07-29: qty 2

## 2015-07-29 NOTE — Progress Notes (Signed)
PT Cancellation Note  Patient Details Name: Harvel QualeDorothy L Cali MRN: [id number] DOB: 01/27/1925   Cancelled Treatment:    Reason Eval/Treat Not Completed: Fatigue/lethargy limiting ability to participate Attempted to see pt this AM, she was sleeping soundly despite shoulder shake and sternal rub. Family present and reports that she did not wake while she was having blood drawn earlier to day.  Pt to go to rehab later today, PT held.  Malachi ProGalen R Pegi Milazzo, DPT 07/29/2015, 10:56 AM

## 2015-07-29 NOTE — Clinical Social Work Placement (Signed)
   CLINICAL SOCIAL WORK PLACEMENT  NOTE  Date:  07/29/2015  Patient Details  Name: [name] MRN: [id number] Date of Birth: 01/02/1925  Clinical Social Work is seeking post-discharge placement for this patient at the Skilled  Nursing Facility level of care (*CSW will initial, date and re-position this form in  chart as items are completed):  Yes   Patient/family provided with [place] Clinical Social Work Department's list of facilities offering this level of care within the geographic area requested by the patient (or if unable, by the patient's family).  Yes   Patient/family informed of their freedom to choose among providers that offer the needed level of care, that participate in Medicare, Medicaid or managed care program needed by the patient, have an available bed and are willing to accept the patient.  Yes   Patient/family informed of [facility]'s ownership interest in [facility] and [facility], as well as of the fact that they are under no obligation to receive care at these facilities.  PASRR submitted to EDS on 07/23/15     PASRR number received on 07/23/15     Existing PASRR number confirmed on       FL2 transmitted to all facilities in geographic area requested by pt/family on 07/23/15     FL2 transmitted to all facilities within larger geographic area on       Patient informed that his/her managed care company has contracts with or will negotiate with certain facilities, including the following:        Yes   Patient/family informed of bed offers received.  Patient chooses bed at  [facility]([place])     Physician recommends and patient chooses bed at  [facility](SNF)    Patient to be transferred to  [facility]([place]) on 07/29/15.  Patient to be transferred to facility by  (EMS)     Patient family notified on 07/29/15 of transfer.  Name of family member notified:   (patient's son in hospital room)     PHYSICIAN       Additional Comment:     _______________________________________________ York SpanielMonica Ben Sanz, LCSW 07/29/2015, 10:56 AM

## 2015-07-29 NOTE — Progress Notes (Signed)
Pt to be discharged go [facility] today. Iv a nd tele discontinued. Report called to the facility. Transported via e.m.s.

## 2015-07-29 NOTE — Clinical Documentation Improvement (Signed)
Internal Medicine Orthopedic  Abnormal Lab/Test Results:  Component     Latest Ref Rng 07/25/2015 07/26/2015 07/26/2015 07/27/2015 07/28/2015          6:51 AM  9:26 AM    Potassium     3.5 - 5.1 mmol/L 3.5 3.0 (L) 3.4 (L) 3.4 (L) 2.9 (LL)    Possible Clinical Conditions associated with below indicators  Hypokalemia  Other Condition  Cannot Clinically Determine   Supporting Information:  6/23 Nsg Note:  Lab called with critical potassium level of 2.9. Dr. Amado CoeGouru notified and ordered 40 mEQ oral and 10 mEQ x4 IV  Treatment Provided:  Potassium 40mEq po IV potassium 10mEq x 4 doses   Please exercise your independent, professional judgment when responding. A specific answer is not anticipated or expected.   Thank You,  Harless Littenebora T Havilah Topor [employer] [place] [phone]

## 2015-07-29 NOTE — Progress Notes (Signed)
Checked on patient during hourly rounding. Family at bedside. Family noticed patient seem to be more swollen than before. Edema is generalized throughout body and non pitting. IV assessed on right arm. Infiltrated. Fluids stopped. MD notified. Will continue to monitor.

## 2015-07-29 NOTE — Progress Notes (Signed)
Subjective: 3 Days Post-Op Procedure(s) (LRB): ARTHROPLASTY BIPOLAR HIP (HEMIARTHROPLASTY) (Right)    Patient reports pain as mild.  Objective:   VITALS:   Filed Vitals:   07/29/15 0800 07/29/15 1205  BP: 121/60 135/70  Pulse: 83 81  Temp: 98.9 F (37.2 C) 98 F (36.7 C)  Resp: 15 19    Neurologically intact  LABS  Recent Labs  07/26/15 1952 07/27/15 0512 07/28/15 0635 07/29/15 0802  HGB 9.5* 9.6* 9.1* 8.7*  HCT 28.7* 28.4* 27.8*  --   WBC 8.2 9.6 11.1*  --   PLT 203 216 260  --      Recent Labs  07/27/15 0512 07/28/15 0635 07/29/15 0802  NA 141 137 137  K 3.4* 2.9* 3.2*  BUN 17 19 15   CREATININE 0.73 0.67 0.72  GLUCOSE 122* 109* 100*    No results for input(s): LABPT, INR in the last 72 hours.   Assessment/Plan: 3 Days Post-Op Procedure(s) (LRB): ARTHROPLASTY BIPOLAR HIP (HEMIARTHROPLASTY) (Right)   Discharge to SNF

## 2015-07-29 NOTE — Progress Notes (Signed)
Patient ID: [name], female   DOB: 01/03/1925, 82 y.o.   MRN: [id number] [employer] PROGRESS NOTE  [name] FAO:[id number] DOB: 12/29/1924 DOA: 07/21/2015 PCP: Rolm GalaGRANDIS, HEIDI, MD  HPI/Subjective: Patient awakened from sleep. Once she awoke she was able to answer questions. Offers no complaints.  Objective: Filed Vitals:   07/29/15 0418 07/29/15 0800  BP: 125/55 121/60  Pulse: 93 83  Temp: 99.8 F (37.7 C) 98.9 F (37.2 C)  Resp: 20 15    Filed Weights   07/21/15 2306  Weight: 58.968 kg (130 lb)    ROS: Review of Systems  Constitutional: Negative for fever and chills.  Eyes: Negative for blurred vision.  Respiratory: Negative for cough and shortness of breath.   Cardiovascular: Negative for chest pain.  Gastrointestinal: Positive for diarrhea. Negative for nausea, vomiting, abdominal pain and constipation.  Genitourinary: Negative for dysuria.  Musculoskeletal: Negative for joint pain.  Neurological: Negative for dizziness and headaches.   Exam: Physical Exam  HENT:  Nose: No mucosal edema.  Mouth/Throat: No oropharyngeal exudate or posterior oropharyngeal edema.  Eyes: Conjunctivae, EOM and lids are normal. Pupils are equal, round, and reactive to light.  Neck: No JVD present. Carotid bruit is not present. No edema present. No thyroid mass and no thyromegaly present.  Cardiovascular: S1 normal and S2 normal.  Exam reveals no gallop.   Murmur heard.  Systolic murmur is present with a grade of 2/6  Pulses:      Dorsalis pedis pulses are 2+ on the right side, and 2+ on the left side.  Respiratory: No respiratory distress. She has no wheezes. She has rhonchi in the right lower field and the left lower field. She has no rales.  GI: Soft. Bowel sounds are normal. There is no tenderness.  Musculoskeletal:       Right wrist: She exhibits swelling.       Left wrist: She exhibits swelling.       Right ankle: She exhibits swelling.       Left ankle: She  exhibits swelling.  Lymphadenopathy:    She has no cervical adenopathy.  Neurological: She is alert. No cranial nerve deficit.  Skin: Skin is warm. No rash noted. Nails show no clubbing.  Psychiatric: She has a normal mood and affect.      Data Reviewed: Basic Metabolic Panel:  Recent Labs Lab 07/25/15 0345 07/26/15 [id number] 07/26/15 0926 07/27/15 0512 07/28/15 0635 07/29/15 0802  NA 140 139 141 141 137 137  K 3.5 3.0* 3.4* 3.4* 2.9* 3.2*  CL 110 108  --  114* 112* 112*  CO2 26 24  --  21* 20* 20*  GLUCOSE 116* 111* 106* 122* 109* 100*  BUN 17 13  --  17 19 15   CREATININE 0.70 0.72  --  0.73 0.67 0.72  CALCIUM 8.3* 8.3*  --  7.7* 7.9* 7.7*  MG  --  1.5*  --   --  1.5* 1.8  PHOS  --   --   --   --  2.6  --    CBC:  Recent Labs Lab 07/24/15 0412 07/25/15 0345 07/26/15 0651 07/26/15 0926 07/26/15 1952 07/27/15 0512 07/28/15 0635 07/29/15 0802  WBC 10.2 9.3 8.2  --  8.2 9.6 11.1*  --   NEUTROABS 7.9*  --   --   --   --   --  8.6*  --   HGB 11.5* 10.7* 11.1* 10.9* 9.5* 9.6* 9.1* 8.7*  HCT 34.3* 31.9* 33.3*  32.0* 28.7* 28.4* 27.8*  --   MCV 83.8 84.0 84.2  --  84.5 83.4 82.8  --   PLT 152 167 201  --  203 216 260  --     CBG:  Recent Labs Lab 07/26/15 2101  GLUCAP 134*    Recent Results (from the past 240 hour(s))  Urine culture     Status: None   Collection Time: 07/22/15  4:20 AM  Result Value Ref Range Status   Specimen Description URINE, CLEAN CATCH  Final   Special Requests NONE  Final   Culture NO GROWTH [facility]   Final   Report Status 07/23/2015 FINAL  Final  Surgical pcr screen     Status: Abnormal   Collection Time: 07/22/15  4:20 AM  Result Value Ref Range Status   MRSA, PCR NEGATIVE NEGATIVE Final   Staphylococcus aureus POSITIVE (A) NEGATIVE Final    Comment:        The Xpert SA Assay (FDA approved for NASAL specimens in patients over 80 years of age), is one component of a comprehensive surveillance program.   Test performance has been validated by [facility] for patients greater than or equal to 80 year old. It is not intended to diagnose infection nor to guide or monitor treatment. CRITICAL RESULT CALLED TO, READ BACK BY AND VERIFIED WITH: ANESSA MACRHON @ [id number] 07/22/15 BY [facility]   CULTURE, BLOOD (ROUTINE X 2) w Reflex to ID Panel     Status: None   Collection Time: 07/22/15  1:33 PM  Result Value Ref Range Status   Specimen Description BLOOD RIGHT ANTECUBITAL  Final   Special Requests BOTTLES DRAWN AEROBIC AND ANAEROBIC  5CC  Final   Culture NO GROWTH 5 DAYS  Final   Report Status 07/27/2015 FINAL  Final  CULTURE, BLOOD (ROUTINE X 2) w Reflex to ID Panel     Status: None   Collection Time: 07/22/15  1:33 PM  Result Value Ref Range Status   Specimen Description BLOOD LEFT HAND  Final   Special Requests BOTTLES DRAWN AEROBIC AND ANAEROBIC  4CC  Final   Culture NO GROWTH 5 DAYS  Final   Report Status 07/27/2015 FINAL  Final  CULTURE, BLOOD (ROUTINE X 2) w Reflex to ID Panel     Status: None (Preliminary result)   Collection Time: 07/26/15  7:54 PM  Result Value Ref Range Status   Specimen Description BLOOD RIGHT ASSIST CONTROL  Final   Special Requests   Final    BOTTLES DRAWN AEROBIC AND ANAEROBIC  AERO 6CC ANA 3CC   Culture NO GROWTH 2 DAYS  Final   Report Status PENDING  Incomplete  CULTURE, BLOOD (ROUTINE X 2) w Reflex to ID Panel     Status: None (Preliminary result)   Collection Time: 07/26/15  7:54 PM  Result Value Ref Range Status   Specimen Description BLOOD LEFT WRIST  Final   Special Requests   Final    BOTTLES DRAWN AEROBIC AND ANAEROBIC  AERO 5CC ANA 3CC   Culture NO GROWTH 2 DAYS  Final   Report Status PENDING  Incomplete  MRSA PCR Screening     Status: None   Collection Time: 07/26/15  9:08 PM  Result Value Ref Range Status   MRSA by PCR NEGATIVE NEGATIVE Final    Comment:        The GeneXpert MRSA Assay (FDA approved for NASAL specimens only), is one component of  a comprehensive  MRSA colonization surveillance program. It is not intended to diagnose MRSA infection nor to guide or monitor treatment for MRSA infections.       Scheduled Meds: . apixaban  5 mg Oral BID  . aspirin EC  81 mg Oral Once  . ceFEPime (MAXIPIME) IV  1 g Intravenous Q12H  . ferrous sulfate  325 mg Oral TID PC  . folic acid  1 mg Oral Daily  . ketoconazole  1 application Topical Daily  . Lifitegrast  1 drop Both Eyes BID  . magnesium sulfate 1 - 4 g bolus IVPB  2 g Intravenous Once  . memantine  5 mg Oral BID  . metronidazole  500 mg Intravenous Q8H  . multivitamin with minerals  1 tablet Oral Daily  . PARoxetine  10 mg Oral Daily  . [START ON 07/30/2015] potassium chloride  10 mEq Oral Daily  . potassium chloride  40 mEq Oral Once  . QUEtiapine  25 mg Oral QHS  . saccharomyces boulardii  250 mg Oral BID  . senna  1 tablet Oral BID    Assessment/Plan:  1. Sepsis with pneumonia. As per ID, antibiotics changed over to Levaquin and Flagyl. 2. Hypokalemia and hypomagnesemia. Replace magnesium IV prior to discharge and oral potassium. Oral potassium for a few more days. 3. Right hip fracture. Physical therapy needed at rehabilitation. Pain control. Switch to Tylenol as soon as possible. 4. Dementia on Namenda, Paxil and Seroquel 5. History of pulmonary embolism on eliquis 6. History of IBS with alternating diarrhea and constipation  Code Status:     Code Status Orders        Start     Ordered   07/26/15 1410  Full code   Continuous     07/26/15 1409    Code Status History    Date Active Date Inactive Code Status Order ID Comments User Context   07/22/2015  3:22 AM 07/24/2015  5:35 PM Full Code [id number]  Juanell Fairly, MD Inpatient   07/22/2015  1:59 AM 07/22/2015  1:59 AM Full Code [id number]  Joella Prince, MD ED   07/22/2015  1:59 AM 07/22/2015  3:22 AM Full Code [id number]  Joella Prince, MD ED   02/09/2015 11:52 AM 02/09/2015  6:06 PM DNR [id number]  Gale Journey, MD Inpatient   02/05/2015  8:28 PM 02/09/2015 11:52 AM Full Code [id number]  Wyatt Haste, MD ED     Family Communication: Spoke with family at bedside and nursing staff Disposition Plan: Rehabilitation today  Antibiotics:  Levaquin and Flagyl upon discharge  Time spent: 32 minutes  Alford Highland  [employer]

## 2015-07-29 NOTE — Clinical Social Work Note (Signed)
[facility]. Packet prepared with scripts and patient to transport via ems due to hip fracture/surgery and 3 liters oxygen. Patient's son was in patient's room and is aware of discharge and transport via EMS. York SpanielMonica Vicie Cech MSW,LCSW [phone]

## 2015-07-31 DIAGNOSIS — K589 Irritable bowel syndrome without diarrhea: Secondary | ICD-10-CM | POA: Diagnosis not present

## 2015-07-31 DIAGNOSIS — J189 Pneumonia, unspecified organism: Secondary | ICD-10-CM | POA: Diagnosis not present

## 2015-07-31 LAB — CULTURE, BLOOD (ROUTINE X 2)
CULTURE: NO GROWTH
Culture: NO GROWTH

## 2015-08-03 DIAGNOSIS — F39 Unspecified mood [affective] disorder: Secondary | ICD-10-CM

## 2015-08-03 DIAGNOSIS — K589 Irritable bowel syndrome without diarrhea: Secondary | ICD-10-CM

## 2015-08-03 DIAGNOSIS — J181 Lobar pneumonia, unspecified organism: Secondary | ICD-10-CM | POA: Diagnosis not present

## 2015-08-03 DIAGNOSIS — S7291XA Unspecified fracture of right femur, initial encounter for closed fracture: Secondary | ICD-10-CM | POA: Diagnosis not present

## 2015-08-03 DIAGNOSIS — F0390 Unspecified dementia without behavioral disturbance: Secondary | ICD-10-CM | POA: Diagnosis not present

## 2015-09-14 ENCOUNTER — Encounter: Payer: Medicare Other | Attending: Surgery | Admitting: Surgery

## 2015-09-14 DIAGNOSIS — E785 Hyperlipidemia, unspecified: Secondary | ICD-10-CM | POA: Insufficient documentation

## 2015-09-14 DIAGNOSIS — Z7901 Long term (current) use of anticoagulants: Secondary | ICD-10-CM | POA: Insufficient documentation

## 2015-09-14 DIAGNOSIS — F0390 Unspecified dementia without behavioral disturbance: Secondary | ICD-10-CM | POA: Diagnosis not present

## 2015-09-14 DIAGNOSIS — Z880 Allergy status to penicillin: Secondary | ICD-10-CM | POA: Diagnosis not present

## 2015-09-14 DIAGNOSIS — I10 Essential (primary) hypertension: Secondary | ICD-10-CM | POA: Diagnosis not present

## 2015-09-14 DIAGNOSIS — M419 Scoliosis, unspecified: Secondary | ICD-10-CM | POA: Insufficient documentation

## 2015-09-14 DIAGNOSIS — M8580 Other specified disorders of bone density and structure, unspecified site: Secondary | ICD-10-CM | POA: Diagnosis not present

## 2015-09-14 DIAGNOSIS — Z79899 Other long term (current) drug therapy: Secondary | ICD-10-CM | POA: Insufficient documentation

## 2015-09-14 DIAGNOSIS — L89613 Pressure ulcer of right heel, stage 3: Secondary | ICD-10-CM | POA: Insufficient documentation

## 2015-09-14 DIAGNOSIS — E441 Mild protein-calorie malnutrition: Secondary | ICD-10-CM | POA: Insufficient documentation

## 2015-09-14 DIAGNOSIS — Z7982 Long term (current) use of aspirin: Secondary | ICD-10-CM | POA: Insufficient documentation

## 2015-09-14 DIAGNOSIS — Z86718 Personal history of other venous thrombosis and embolism: Secondary | ICD-10-CM | POA: Diagnosis not present

## 2015-09-14 NOTE — Progress Notes (Addendum)
CHIA, MOWERS ([id number]) Visit Report for 09/14/2015 Abuse/Suicide Risk Screen Details Patient Name: [name], [name] Date of Service: 09/14/2015 3:00 PM Medical Record Number: [id number] Patient Account Number: [id number] Date of Birth/Sex: 19-Jun-1924 (82 y.o. Female) Treating RN: Curtis Sites Primary Care Physician: Rolm Gala Other Clinician: Referring Physician: Rolm Gala Treating Physician/Extender: Rudene Re in Treatment: 0 Abuse/Suicide Risk Screen Items Answer ABUSE/SUICIDE RISK SCREEN: Has anyone close to you tried to hurt or harm you recentlyo No Do you feel uncomfortable with anyone in your familyo No Has anyone forced you do things that you didnot want to doo No Do you have any thoughts of harming yourselfo No Patient displays signs or symptoms of abuse and/or neglect. No Electronic Signature(s) Signed: 09/14/2015 3:10:46 PM By: Curtis Sites Entered By: Curtis Sites on 09/14/2015 15:10:46 [name] ([id number]) -------------------------------------------------------------------------------- Activities of Daily Living Details Patient Name: [name] Date of Service: 09/14/2015 3:00 PM Medical Record Number: [id number] Patient Account Number: [id number] Date of Birth/Sex: [birth year] (82 y.o. Female) Treating RN: Curtis Sites Primary Care Physician: Rolm Gala Other Clinician: Referring Physician: Rolm Gala Treating Physician/Extender: Rudene Re in Treatment: 0 Activities of Daily Living Items Answer Activities of Daily Living (Please select one for each item) Drive Automobile Not Able Take Medications Need Assistance Use Telephone Need Assistance Care for Appearance Need Assistance Use Toilet Need Assistance Bath / Shower Need Assistance Dress Self Need Assistance Feed Self Completely Able Walk Need Assistance Get In / Out Bed Need Assistance Housework Not Able Prepare Meals Not Able Handle Money  Not Able Shop for Self Need Assistance Electronic Signature(s) Signed: 09/14/2015 3:10:20 PM By: Curtis Sites Entered By: Curtis Sites on 09/14/2015 15:10:19 [name] ([id number]) -------------------------------------------------------------------------------- Education Assessment Details Patient Name: [name] Date of Service: 09/14/2015 3:00 PM Medical Record Number: [id number] Patient Account Number: [id number] Date of Birth/Sex: 1925-01-03 (82 y.o. Female) Treating RN: Curtis Sites Primary Care Physician: Rolm Gala Other Clinician: Referring Physician: Rolm Gala Treating Physician/Extender: Rudene Re in Treatment: 0 Primary Learner Assessed: Caregiver Reason Patient is not Primary Learner: wound location Learning Preferences/Education Level/Primary Language Learning Preference: Explanation, Demonstration Highest Education Level: High School Preferred Language: English Cognitive Barrier Assessment/Beliefs Language Barrier: No Translator Needed: No Memory Deficit: No Emotional Barrier: No Cultural/Religious Beliefs Affecting Medical No Care: Physical Barrier Assessment Impaired Vision: No Impaired Hearing: No Decreased Hand dexterity: No Knowledge/Comprehension Assessment Knowledge Level: Medium Comprehension Level: Medium Ability to understand written Medium instructions: Ability to understand verbal Medium instructions: Motivation Assessment Anxiety Level: Calm Cooperation: Cooperative Education Importance: Acknowledges Need Interest in Health Problems: Asks Questions Perception: Coherent Willingness to Engage in Self- Medium Management Activities: Readiness to Engage in Self- Medium Management Activities: LANDIS, CASSARO ([id number]) Electronic Signature(s) Signed: 09/14/2015 3:08:41 PM By: Curtis Sites Entered By: Curtis Sites on 09/14/2015 15:08:40 [name]  ([id number]) -------------------------------------------------------------------------------- Fall Risk Assessment Details Patient Name: [name] Date of Service: 09/14/2015 3:00 PM Medical Record Number: [id number] Patient Account Number: [id number] Date of Birth/Sex: [birth year] (82 y.o. Female) Treating RN: Curtis Sites Primary Care Physician: Rolm Gala Other Clinician: Referring Physician: Rolm Gala Treating Physician/Extender: Rudene Re in Treatment: 0 Fall Risk Assessment Items Have you had 2 or more falls in the last 12 monthso 0 Yes Have you had any fall that resulted in injury in the last 12 monthso 0 Yes FALL RISK ASSESSMENT: History of falling - immediate or within 3 months 25 Yes Secondary diagnosis 0 No Ambulatory aid None/bed  rest/wheelchair/nurse 0 Yes Crutches/cane/walker 0 No Furniture 0 No IV Access/Saline Lock 0 No Gait/Training Normal/bed rest/immobile 0 No Weak 10 Yes Impaired 0 No Mental Status Oriented to own ability 0 Yes Electronic Signature(s) Signed: 09/14/2015 4:38:29 PM By: Curtis Sitesorthy, Joanna Entered By: Curtis Sitesorthy, Joanna on 09/14/2015 15:23:29 Colleen QualeISLEY, Jamieka L. ([id number]) -------------------------------------------------------------------------------- Foot Assessment Details Patient Name: Colleen QualeISLEY, Jennifermarie L. Date of Service: 09/14/2015 3:00 PM Medical Record Number: [id number] Patient Account Number: [id number] Date of Birth/Sex: 05/27/1924 (82 y.o. Female) Treating RN: Curtis Sitesorthy, Joanna Primary Care Physician: Rolm GalaGrandis, Heidi Other Clinician: Referring Physician: Rolm GalaGrandis, Heidi Treating Physician/Extender: Rudene ReBritto, Errol Weeks in Treatment: 0 Foot Assessment Items Site Locations + = Sensation present, - = Sensation absent, C = Callus, U = Ulcer R = Redness, W = Warmth, M = Maceration, PU = Pre-ulcerative lesion F = Fissure, S = Swelling, D = Dryness Assessment Right: Left: Other Deformity: No No Prior Foot Ulcer: No No Prior  Amputation: No No Charcot Joint: No No Ambulatory Status: Non-ambulatory Assistance Device: Wheelchair Gait: Steady Electronic Signature(s) Signed: 09/14/2015 4:38:29 PM By: Curtis Sitesorthy, Joanna Entered By: Curtis Sitesorthy, Joanna on 09/14/2015 15:24:16 Mia, [name] ([id number]) -------------------------------------------------------------------------------- Nutrition Risk Assessment Details Patient Name: Colleen QualeISLEY, Lavena L. Date of Service: 09/14/2015 3:00 PM Medical Record Number: [id number] Patient Account Number: [id number] Date of Birth/Sex: 03/06/1924 (82 y.o. Female) Treating RN: Curtis Sitesorthy, Joanna Primary Care Physician: Rolm GalaGrandis, Heidi Other Clinician: Referring Physician: Rolm GalaGrandis, Heidi Treating Physician/Extender: Rudene ReBritto, Errol Weeks in Treatment: 0 Height (in): Weight (lbs): Body Mass Index (BMI): Nutrition Risk Assessment Items NUTRITION RISK SCREEN: I have an illness or condition that made me change the kind and/or 0 No amount of food I eat I eat fewer than two meals per day 0 No I eat few fruits and vegetables, or milk products 2 Yes I have three or more drinks of beer, liquor or wine almost every day 0 No I have tooth or mouth problems that make it hard for me to eat 0 No I don't always have enough money to buy the food I need 0 No I eat alone most of the time 0 No I take three or more different prescribed or over-the-counter drugs a 1 Yes day Without wanting to, I have lost or gained 10 pounds in the last six 0 No months I am not always physically able to shop, cook and/or feed myself 0 No Nutrition Protocols Good Risk Protocol Provide education on Moderate Risk Protocol 0 nutrition Electronic Signature(s) Signed: 09/14/2015 3:09:14 PM By: Curtis Sitesorthy, Joanna Entered By: Curtis Sitesorthy, Joanna on 09/14/2015 15:09:13

## 2015-09-14 NOTE — Progress Notes (Addendum)
[name], [name] ([id number]) Visit Report for 09/14/2015 Allergy List Details Patient Name: [name], [name] Date of Service: 09/14/2015 3:00 PM Medical Record Number: [id number] Patient Account Number: [id number] Date of Birth/Sex: 04/07/1924 (82 y.o. Female) Treating RN: Curtis Sitesorthy, Joanna Primary Care Physician: Rolm GalaGrandis, Heidi Other Clinician: Referring Physician: Rolm GalaGrandis, Heidi Treating Physician/Extender: Rudene ReBritto, Errol Weeks in Treatment: 0 Allergies Active Allergies Bactrim azithromycin penicillin Sulfa (Sulfonamide Antibiotics) Vibramycin Allergy Notes Electronic Signature(s) Signed: 09/14/2015 3:07:34 PM By: Curtis Sitesorthy, Joanna Entered By: Curtis Sitesorthy, Joanna on 09/14/2015 15:07:32 [name], [name] ([id number]) -------------------------------------------------------------------------------- Arrival Information Details Patient Name: [name], [name] Date of Service: 09/14/2015 3:00 PM Medical Record Number: [id number] Patient Account Number: [id number] Date of Birth/Sex: 05/11/1924 (82 y.o. Female) Treating RN: Curtis Sitesorthy, Joanna Primary Care Physician: Rolm GalaGrandis, Heidi Other Clinician: Referring Physician: Rolm GalaGrandis, Heidi Treating Physician/Extender: Rudene ReBritto, Errol Weeks in Treatment: 0 Visit Information Patient Arrived: Wheel Chair Arrival Time: 15:16 Accompanied By: Melony Overlydtrs Transfer Assistance: Manual Patient Identification Verified: Yes Secondary Verification Process Yes Completed: Patient Has Alerts: Yes Patient Alerts: Patient on Blood Thinner eliquis Electronic Signature(s) Signed: 09/14/2015 4:38:29 PM By: Curtis Sitesorthy, Joanna Entered By: Curtis Sitesorthy, Joanna on 09/14/2015 15:21:27 [name], [name] ([id number]) -------------------------------------------------------------------------------- Clinic Level of Care Assessment Details Patient Name: [name], [name] Date of Service: 09/14/2015 3:00 PM Medical Record Number: [id number] Patient Account Number: [id number] Date of Birth/Sex:  05/10/1924 (82 y.o. Female) Treating RN: Afful, RN, BSN, [place] Primary Care Physician: Rolm GalaGrandis, Heidi Other Clinician: Referring Physician: Rolm GalaGrandis, Heidi Treating Physician/Extender: Rudene ReBritto, Errol Weeks in Treatment: 0 Clinic Level of Care Assessment Items TOOL 1 Quantity Score []  - Use when EandM and Procedure is performed on INITIAL visit 0 ASSESSMENTS - Nursing Assessment / Reassessment X - General Physical Exam (combine w/ comprehensive assessment (listed just 1 20 below) when performed on new pt. evals) X - Comprehensive Assessment (HX, ROS, Risk Assessments, Wounds Hx, etc.) 1 25 ASSESSMENTS - Wound and Skin Assessment / Reassessment []  - Dermatologic / Skin Assessment (not related to wound area) 0 ASSESSMENTS - Ostomy and/or Continence Assessment and Care []  - Incontinence Assessment and Management 0 []  - Ostomy Care Assessment and Management (repouching, etc.) 0 PROCESS - Coordination of Care X - Simple Patient / Family Education for ongoing care 1 15 []  - Complex (extensive) Patient / Family Education for ongoing care 0 X - Staff obtains ChiropractorConsents, Records, Test Results / Process Orders 1 10 []  - Staff telephones HHA, Nursing Homes / Clarify orders / etc 0 []  - Routine Transfer to another Facility (non-emergent condition) 0 []  - Routine Hospital Admission (non-emergent condition) 0 X - New Admissions / Manufacturing engineernsurance Authorizations / Ordering NPWT, Apligraf, etc. 1 15 []  - Emergency Hospital Admission (emergent condition) 0 PROCESS - Special Needs []  - Pediatric / Minor Patient Management 0 []  - Isolation Patient Management 0 [name], [name] ([id number]) []  - Hearing / Language / Visual special needs 0 []  - Assessment of Community assistance (transportation, D/C planning, etc.) 0 []  - Additional assistance / Altered mentation 0 []  - Support Surface(s) Assessment (bed, cushion, seat, etc.) 0 INTERVENTIONS - Miscellaneous []  - External ear exam 0 []  - Patient Transfer (multiple  staff / Nurse, adultHoyer Lift / Similar devices) 0 []  - Simple Staple / Suture removal (25 or less) 0 []  - Complex Staple / Suture removal (26 or more) 0 []  - Hypo/Hyperglycemic Management (do not check if billed separately) 0 X - Ankle / Brachial Index (ABI) - do not check if billed separately 1 15 Has the patient been seen at  the hospital within the last three years: Yes Total Score: 100 Level Of Care: New/Established - Level 3 Electronic Signature(s) Signed: 09/14/2015 4:59:27 PM By: Elpidio Eric BSN, RN Entered By: Elpidio Eric on 09/14/2015 16:56:07 [name] ([id number]) -------------------------------------------------------------------------------- Encounter Discharge Information Details Patient Name: [name] Date of Service: 09/14/2015 3:00 PM Medical Record Number: [id number] Patient Account Number: [id number] Date of Birth/Sex: February 28, 1924 (82 y.o. Female) Treating RN: Curtis Sites Primary Care Physician: Rolm Gala Other Clinician: Referring Physician: Rolm Gala Treating Physician/Extender: Rudene Re in Treatment: 0 Encounter Discharge Information Items Discharge Pain Level: 0 Discharge Condition: Stable Ambulatory Status: Wheelchair Discharge Destination: Home Private Transportation: Auto Accompanied By: Melony Overly Schedule Follow-up Appointment: Yes Medication Reconciliation completed and No provided to Patient/Care [place]: Clinical Summary of Care: Electronic Signature(s) Signed: 09/14/2015 4:38:29 PM By: Curtis Sites Previous Signature: 09/14/2015 4:20:51 PM Version By: Gwenlyn Perking Entered By: Curtis Sites on 09/14/2015 16:20:57 [name] ([id number]) -------------------------------------------------------------------------------- Lower Extremity Assessment Details Patient Name: [name] Date of Service: 09/14/2015 3:00 PM Medical Record Number: [id number] Patient Account Number: [id number] Date of Birth/Sex: [birth year]  (82 y.o. Female) Treating RN: Curtis Sites Primary Care Physician: Rolm Gala Other Clinician: Referring Physician: Rolm Gala Treating Physician/Extender: Rudene Re in Treatment: 0 Edema Assessment Assessed: [Left: No] [Right: No] Edema: [Left: Ye] [Right: s] Calf Left: Right: Point of Measurement: 32 cm From Medial Instep cm 30 cm Ankle Left: Right: Point of Measurement: 10 cm From Medial Instep cm 21.5 cm Vascular Assessment Pulses: Posterior Tibial Palpable: [Right:Yes] Doppler: [Right:Monophasic] Dorsalis Pedis Palpable: [Right:Yes] Doppler: [Right:Monophasic] Extremity colors, hair growth, and conditions: Extremity Color: [Right:Normal] Hair Growth on Extremity: [Right:Yes] Temperature of Extremity: [Right:Warm] Capillary Refill: [Right:< 3 seconds] Blood Pressure: Brachial: [Right:126] Dorsalis Pedis: [Left:Dorsalis Pedis: 110] Ankle: Posterior Tibial: [Left:Posterior Tibial: 102] [Right:0.87] Toe Nail Assessment Left: Right: Thick: Yes Discolored: Yes Deformed: No Improper Length and Hygiene: Yes [name], [name] ([id number]) Electronic Signature(s) Signed: 09/14/2015 4:38:29 PM By: Curtis Sites Entered By: Curtis Sites on 09/14/2015 15:44:22 Nusbaum, Mena Elbert Ewings ([id number]) -------------------------------------------------------------------------------- Multi Wound Chart Details Patient Name: [name] Date of Service: 09/14/2015 3:00 PM Medical Record Number: [id number] Patient Account Number: [id number] Date of Birth/Sex: 1924-05-25 (82 y.o. Female) Treating RN: Clover Mealy, RN, BSN, [place] Primary Care Physician: Rolm Gala Other Clinician: Referring Physician: Rolm Gala Treating Physician/Extender: Rudene Re in Treatment: 0 Vital Signs Height(in): 59 Pulse(bpm): 85 Weight(lbs): 108 Blood Pressure 126/61 (mmHg): Body Mass Index(BMI): 22 Temperature(F): 97.5 Respiratory Rate 16 (breaths/min): Photos:  [N/A:N/A] Wound Location: Right Calcaneus N/A N/A Wounding Event: Pressure Injury N/A N/A Primary Etiology: Pressure Ulcer N/A N/A Comorbid History: Deep Vein Thrombosis, N/A N/A Hypertension, Dementia Date Acquired: 07/14/2015 N/A N/A Weeks of Treatment: 0 N/A N/A Wound Status: Open N/A N/A Measurements L x W x D 2.7x2.6x0.2 N/A N/A (cm) Area (cm) : 5.513 N/A N/A Volume (cm) : 1.103 N/A N/A Classification: Category/Stage III N/A N/A Exudate Amount: Large N/A N/A Exudate Type: Serous N/A N/A Exudate Color: amber N/A N/A Wound Margin: Flat and Intact N/A N/A Granulation Amount: None Present (0%) N/A N/A Necrotic Amount: Large (67-100%) N/A N/A Necrotic Tissue: Eschar, Adherent Slough N/A N/A Exposed Structures: Fascia: No N/A N/A Fat: No Tendon: No Mclaine, Naleyah L. ([id number]) Muscle: No Joint: No Bone: No Limited to Skin Breakdown Epithelialization: None N/A N/A Periwound Skin Texture: Edema: No N/A N/A Excoriation: No Induration: No Callus: No Crepitus: No Fluctuance: No Friable: No Rash: No Scarring: No Periwound Skin Moist: Yes  N/A N/A Moisture: Maceration: No Dry/Scaly: No Periwound Skin Color: Erythema: Yes N/A N/A Atrophie Blanche: No Cyanosis: No Ecchymosis: No Hemosiderin Staining: No Mottled: No Pallor: No Rubor: No Erythema Location: Circumferential N/A N/A Temperature: No Abnormality N/A N/A Tenderness on Yes N/A N/A Palpation: Wound Preparation: Ulcer Cleansing: N/A N/A Rinsed/Irrigated with Saline Topical Anesthetic Applied: Other: lidocaine 4% Treatment Notes Electronic Signature(s) Signed: 09/14/2015 4:59:27 PM By: Elpidio Eric BSN, RN Entered By: Elpidio Eric on 09/14/2015 16:00:45 [name] ([id number]) -------------------------------------------------------------------------------- Multi-Disciplinary Care Plan Details Patient Name: [name] Date of Service: 09/14/2015 3:00 PM Medical Record Number:  [id number] Patient Account Number: [id number] Date of Birth/Sex: 28-Nov-1924 (82 y.o. Female) Treating RN: Afful, RN, BSN, [place] Primary Care Physician: Rolm Gala Other Clinician: Referring Physician: Rolm Gala Treating Physician/Extender: Rudene Re in Treatment: 0 Active Inactive Abuse / Safety / Falls / Self Care Management Nursing Diagnoses: Impaired home maintenance Impaired physical mobility Potential for falls Self care deficit: actual or potential Goals: Patient will remain injury free Date Initiated: 09/14/2015 Goal Status: Active Patient/caregiver will verbalize understanding of skin care regimen Date Initiated: 09/14/2015 Goal Status: Active Patient/caregiver will verbalize/demonstrate measure taken to improve self care Date Initiated: 09/14/2015 Goal Status: Active Patient/caregiver will verbalize/demonstrate measures taken to improve the patient's personal safety Date Initiated: 09/14/2015 Goal Status: Active Patient/caregiver will verbalize/demonstrate measures taken to prevent injury and/or falls Date Initiated: 09/14/2015 Goal Status: Active Patient/caregiver will verbalize/demonstrate understanding of what to do in case of emergency Date Initiated: 09/14/2015 Goal Status: Active Interventions: Assess fall risk on admission and as needed Assess: immobility, friction, shearing, incontinence upon admission and as needed Assess impairment of mobility on admission and as needed per policy Assess self care needs on admission and as needed Provide education on basic hygiene Provide education on personal and home safety [name], [name] ([id number]) Provide education on safe transfers Notes: Orientation to the Wound Care Program Nursing Diagnoses: Knowledge deficit related to the wound healing center program Goals: Patient/caregiver will verbalize understanding of the Wound Healing Center Program Date Initiated: 09/14/2015 Goal Status:  Active Interventions: Provide education on orientation to the wound center Notes: Pressure Nursing Diagnoses: Knowledge deficit related to causes and risk factors for pressure ulcer development Knowledge deficit related to management of pressures ulcers Potential for impaired tissue integrity related to pressure, friction, moisture, and shear Goals: Patient will remain free from development of additional pressure ulcers Date Initiated: 09/14/2015 Goal Status: Active Patient will remain free of pressure ulcers Date Initiated: 09/14/2015 Goal Status: Active Patient/caregiver will verbalize risk factors for pressure ulcer development Date Initiated: 09/14/2015 Goal Status: Active Patient/caregiver will verbalize understanding of pressure ulcer management Date Initiated: 09/14/2015 Goal Status: Active Interventions: Assess: immobility, friction, shearing, incontinence upon admission and as needed Assess offloading mechanisms upon admission and as needed Assess potential for pressure ulcer upon admission and as needed Provide education on pressure ulcers Treatment Activities: DEBRAH, GRANDERSON ([id number]) Patient referred for pressure reduction/relief devices : 09/14/2015 Patient referred for seating evaluation to ensure proper offloading : 09/14/2015 Pressure reduction/relief device ordered : 09/14/2015 Notes: Wound/Skin Impairment Nursing Diagnoses: Impaired tissue integrity Knowledge deficit related to ulceration/compromised skin integrity Goals: Patient/caregiver will verbalize understanding of skin care regimen Date Initiated: 09/14/2015 Goal Status: Active Ulcer/skin breakdown will have a volume reduction of 30% by week 4 Date Initiated: 09/14/2015 Goal Status: Active Ulcer/skin breakdown will have a volume reduction of 50% by week 8 Date Initiated: 09/14/2015 Goal Status: Active Ulcer/skin breakdown will have a volume reduction  of 80% by week 12 Date Initiated: 09/14/2015 Goal  Status: Active Ulcer/skin breakdown will heal within 14 weeks Date Initiated: 09/14/2015 Goal Status: Active Interventions: Assess patient/caregiver ability to obtain necessary supplies Assess patient/caregiver ability to perform ulcer/skin care regimen upon admission and as needed Assess ulceration(s) every visit Provide education on ulcer and skin care Treatment Activities: Referred to DME Colie Fugitt for dressing supplies : 09/14/2015 Skin care regimen initiated : 09/14/2015 Topical wound management initiated : 09/14/2015 Notes: Electronic Signature(s) Signed: 09/14/2015 4:59:27 PM By: Elpidio Eric BSN, RN Entered By: Elpidio Eric on 09/14/2015 16:00:22 [name] ([id number] Mallick, Theora Gianotti ([id number]) -------------------------------------------------------------------------------- Pain Assessment Details Patient Name: [name] Date of Service: 09/14/2015 3:00 PM Medical Record Number: [id number] Patient Account Number: [id number] Date of Birth/Sex: 1924-11-27 (82 y.o. Female) Treating RN: Curtis Sites Primary Care Physician: Rolm Gala Other Clinician: Referring Physician: Rolm Gala Treating Physician/Extender: Rudene Re in Treatment: 0 Active Problems Location of Pain Severity and Description of Pain Patient Has Paino No Site Locations Pain Management and Medication Current Pain Management: Notes Topical or injectable lidocaine is offered to patient for acute pain when surgical debridement is performed. If needed, Patient is instructed to use over the counter pain medication for the following 24-48 hours after debridement. Wound care MDs do not prescribed pain medications. Patient has chronic pain or uncontrolled pain. Patient has been instructed to make an appointment with their Primary Care Physician for pain management. Electronic Signature(s) Signed: 09/14/2015 4:38:29 PM By: Curtis Sites Entered By: Curtis Sites on 09/14/2015  15:21:38 [name] ([id number]) -------------------------------------------------------------------------------- Patient/Caregiver Education Details Patient Name: [name] Date of Service: 09/14/2015 3:00 PM Medical Record Number: [id number] Patient Account Number: [id number] Date of Birth/Gender: [birth year] (82 y.o. Female) Treating RN: Curtis Sites Primary Care Physician: Rolm Gala Other Clinician: Referring Physician: Rolm Gala Treating Physician/Extender: Rudene Re in Treatment: 0 Education Assessment Education Provided To: Patient and Caregiver Education Topics Provided Pressure: Handouts: Pressure Ulcers: Care and Offloading Methods: Explain/Verbal Responses: State content correctly Wound/Skin Impairment: Handouts: Other: wound care as ordered Methods: Demonstration, Explain/Verbal Responses: State content correctly Electronic Signature(s) Signed: 09/14/2015 4:38:29 PM By: Curtis Sites Entered By: Curtis Sites on 09/14/2015 15:47:28 [place], Theora Gianotti ([id number]) -------------------------------------------------------------------------------- Wound Assessment Details Patient Name: [name] Date of Service: 09/14/2015 3:00 PM Medical Record Number: [id number] Patient Account Number: [id number] Date of Birth/Sex: Jul 09, 1924 (82 y.o. Female) Treating RN: Curtis Sites Primary Care Physician: Rolm Gala Other Clinician: Referring Physician: Rolm Gala Treating Physician/Extender: Rudene Re in Treatment: 0 Wound Status Wound Number: 1 Primary Pressure Ulcer Etiology: Wound Location: Right Calcaneus Wound Status: Open Wounding Event: Pressure Injury Comorbid Deep Vein Thrombosis, Date Acquired: 07/14/2015 History: Hypertension, Dementia Weeks Of Treatment: 0 Clustered Wound: No Photos Wound Measurements Length: (cm) 2.7 Width: (cm) 2.6 Depth: (cm) 0.2 Area: (cm) 5.513 Volume: (cm) 1.103 %  Reduction in Area: % Reduction in Volume: Epithelialization: None Tunneling: No Undermining: No Wound Description Classification: Category/Stage III Wound Margin: Flat and Intact Exudate Amount: Large Exudate Type: Serous Exudate Color: amber Foul Odor After Cleansing: No Wound Bed Granulation Amount: None Present (0%) Exposed Structure Necrotic Amount: Large (67-100%) Fascia Exposed: No Necrotic Quality: Eschar, Adherent Slough Fat Layer Exposed: No Tendon Exposed: No Muscle Exposed: No Lilja, Cerina L. ([id number]) Joint Exposed: No Bone Exposed: No Limited to Skin Breakdown Periwound Skin Texture Texture Color No Abnormalities Noted: No No Abnormalities Noted: No Callus: No Atrophie Blanche: No Crepitus: No Cyanosis: No Excoriation: No Ecchymosis:  No Fluctuance: No Erythema: Yes Friable: No Erythema Location: Circumferential Induration: No Hemosiderin Staining: No Localized Edema: No Mottled: No Rash: No Pallor: No Scarring: No Rubor: No Moisture Temperature / Pain No Abnormalities Noted: No Temperature: No Abnormality Dry / Scaly: No Tenderness on Palpation: Yes Maceration: No Moist: Yes Wound Preparation Ulcer Cleansing: Rinsed/Irrigated with Saline Topical Anesthetic Applied: Other: lidocaine 4%, Treatment Notes Wound #1 (Right Calcaneus) 1. Cleansed with: Clean wound with Normal Saline 2. Anesthetic Topical Lidocaine 4% cream to wound bed prior to debridement 4. Dressing Applied: Santyl Ointment 5. Secondary Dressing Applied Guaze, ABD and kerlix/Conform 7. Secured with Secretary/administrator) Signed: 09/14/2015 4:38:29 PM By: Curtis Sites Entered By: Curtis Sites on 09/14/2015 15:38:16 [name] ([id number]) -------------------------------------------------------------------------------- Vitals Details Patient Name: [name] Date of Service: 09/14/2015 3:00 PM Medical Record Number: [id number] Patient Account  Number: [id number] Date of Birth/Sex: 1924/12/02 (82 y.o. Female) Treating RN: Curtis Sites Primary Care Physician: Rolm Gala Other Clinician: Referring Physician: Rolm Gala Treating Physician/Extender: Rudene Re in Treatment: 0 Vital Signs Time Taken: 15:18 Temperature (F): 97.5 Height (in): 59 Pulse (bpm): 85 Source: Stated Respiratory Rate (breaths/min): 16 Weight (lbs): 108 Blood Pressure (mmHg): 126/61 Source: Stated Reference Range: 80 - 120 mg / dl Body Mass Index (BMI): 21.8 Electronic Signature(s) Signed: 09/14/2015 4:38:29 PM By: Curtis Sites Entered By: Curtis Sites on 09/14/2015 15:22:38

## 2015-09-15 NOTE — Progress Notes (Signed)
[name], [name] ([id number]) Visit Report for 09/14/2015 Chief Complaint Document Details Patient Name: [name], [name] Date of Service: 09/14/2015 3:00 PM Medical Record Number: [id number] Patient Account Number: [id number] Date of Birth/Sex: [birth year] (82 y.o. Female) Treating RN: Curtis Sites Primary Care Physician: Rolm Gala Other Clinician: Referring Physician: Rolm Gala Treating Physician/Extender: Rudene Re in Treatment: 0 Information Obtained from: Patient Chief Complaint Patient is at the clinic for treatment of an open pressure ulcer right heel which she's had for about 8 weeks Electronic Signature(s) Signed: 09/14/2015 4:21:50 PM By: Evlyn Kanner MD, FACS Entered By: Evlyn Kanner on 09/14/2015 16:21:49 [name] ([id number]) -------------------------------------------------------------------------------- Debridement Details Patient Name: [name] Date of Service: 09/14/2015 3:00 PM Medical Record Number: [id number] Patient Account Number: [id number] Date of Birth/Sex: 16-Aug-1924 (82 y.o. Female) Treating RN: Curtis Sites Primary Care Physician: Rolm Gala Other Clinician: Referring Physician: Rolm Gala Treating Physician/Extender: Rudene Re in Treatment: 0 Debridement Performed for Wound #1 Right Calcaneus Assessment: Performed By: Physician Evlyn Kanner, MD Debridement: Debridement Pre-procedure Yes Verification/Time Out Taken: Start Time: 15:58 Pain Control: Lidocaine 4% Topical Solution Level: Skin/Subcutaneous Tissue Total Area Debrided (L x 2.7 (cm) x 2.6 (cm) = 7.02 (cm) W): Tissue and other Viable, Non-Viable, Eschar, Exudate, Fibrin/Slough, Skin, Subcutaneous material debrided: Instrument: Blade, Forceps Bleeding: Minimum Hemostasis Achieved: Pressure End Time: 16:05 Procedural Pain: 0 Post Procedural Pain: 0 Response to Treatment: Procedure was tolerated well Post Debridement  Measurements of Total Wound Length: (cm) 2.7 Stage: Category/Stage III Width: (cm) 2.6 Depth: (cm) 0.2 Volume: (cm) 1.103 Post Procedure Diagnosis Same as Pre-procedure Electronic Signature(s) Signed: 09/14/2015 4:21:20 PM By: Evlyn Kanner MD, FACS Signed: 09/14/2015 4:38:29 PM By: Curtis Sites Entered By: Evlyn Kanner on 09/14/2015 16:21:20 [name] ([id number]) -------------------------------------------------------------------------------- HPI Details Patient Name: [name] Date of Service: 09/14/2015 3:00 PM Medical Record Number: [id number] Patient Account Number: [id number] Date of Birth/Sex: 1924-08-20 (82 y.o. Female) Treating RN: Curtis Sites Primary Care Physician: Rolm Gala Other Clinician: Referring Physician: Rolm Gala Treating Physician/Extender: Rudene Re in Treatment: 0 History of Present Illness Location: right heel Quality: Patient reports experiencing a dull pain to affected area(s). Severity: Patient states wound are getting worse. Duration: Patient has had the wound for < 8 weeks prior to presenting for treatment Timing: Pain in wound is Intermittent (comes and goes Context: The wound occurred when the patient the hip fracture and was in traction for a few days while in hospital Modifying Factors: Other treatment(s) tried include:local care symptomatic treatment Associated Signs and Symptoms: Patient reports having increase discharge. HPI Description: 82 year old patient has been referred to as by her PCP for a pressure ulcer of the right heel. his had this for about 8 weeks Has been treated with duoderm. Has been there for a while it's getting more painful and larger and she also was seen for a recent urinary tract infection. She was given ciprofloxacin for this and then changed on to nitrofurantoin. She has a past medical history of rheumatic fever, hyperlipidemia, hypertension, scoliosis and osteopenia. never been  a smoker. Electronic Signature(s) Signed: 09/14/2015 4:23:27 PM By: Evlyn Kanner MD, FACS Previous Signature: 09/14/2015 3:33:36 PM Version By: Evlyn Kanner MD, FACS Entered By: Evlyn Kanner on 09/14/2015 16:23:27 [name] ([id number]) -------------------------------------------------------------------------------- Physical Exam Details Patient Name: [name] Date of Service: 09/14/2015 3:00 PM Medical Record Number: [id number] Patient Account Number: [id number] Date of Birth/Sex: [birth year] (82 y.o. Female) Treating RN: Curtis Sites Primary Care Physician: Rolm Gala  Other Clinician: Referring Physician: Rolm Gala Treating Physician/Extender: Rudene Re in Treatment: 0 Constitutional . Pulse regular. Respirations normal and unlabored. Afebrile. . Eyes Nonicteric. Reactive to light. Ears, Nose, Mouth, and Throat Lips, teeth, and gums WNL.Marland Kitchen Moist mucosa without lesions. Neck supple and nontender. No palpable supraclavicular or cervical adenopathy. Normal sized without goiter. Respiratory WNL. No retractions.. Breath sounds WNL, No rubs, rales, rhonchi, or wheeze.. Chest Breasts symmetical and no nipple discharge.. Breast tissue WNL, no masses, lumps, or tenderness.. Gastrointestinal (GI) Abdomen without masses or tenderness.. No liver or spleen enlargement or tenderness.. Lymphatic No adneopathy. No adenopathy. No adenopathy. Musculoskeletal Adexa without tenderness or enlargement.. Digits and nails w/o clubbing, cyanosis, infection, petechiae, ischemia, or inflammatory conditions.. Integumentary (Hair, Skin) No suspicious lesions. No crepitus or fluctuance. No peri-wound warmth or erythema. No masses.Marland Kitchen Psychiatric Judgement and insight Intact.. No evidence of depression, anxiety, or agitation.. Notes has a thick eschar and necrotic skin on the right calcaneum which has a layer of separation and using a forcep and number in blade I was able  to remove the entire necrotic debris and there is no probing down to bone. Electronic Signature(s) Signed: 09/14/2015 4:27:53 PM By: Evlyn Kanner MD, FACS Entered By: Evlyn Kanner on 09/14/2015 16:27:51 [name] ([id number]) -------------------------------------------------------------------------------- Physician Orders Details Patient Name: [name] Date of Service: 09/14/2015 3:00 PM Medical Record Number: [id number] Patient Account Number: [id number] Date of Birth/Sex: Jun 23, 1924 (82 y.o. Female) Treating RN: Afful, RN, BSN, [place] Sink Primary Care Physician: Rolm Gala Other Clinician: Referring Physician: Rolm Gala Treating Physician/Extender: Rudene Re in Treatment: 0 Verbal / Phone Orders: Yes Clinician: Afful, RN, BSN, Rita Read Back and Verified: Yes Diagnosis Coding Wound Cleansing Wound #1 Right Calcaneus o Cleanse wound with mild soap and water o May Shower, gently pat wound dry prior to applying new dressing. o May shower with protection. Anesthetic Wound #1 Right Calcaneus o Topical Lidocaine 4% cream applied to wound bed prior to debridement - In Clinic Primary Wound Dressing Wound #1 Right Calcaneus o Santyl Ointment - In clinic, can continue to use if able to afford it. o Medihoney gel - Use if not able to afford Santyl Secondary Dressing Wound #1 Right Calcaneus o Gauze and Kerlix/Conform Dressing Change Frequency Wound #1 Right Calcaneus o Change dressing every day. Follow-up Appointments Wound #1 Right Calcaneus o Return Appointment in 1 week. Off-Loading Wound #1 Right Calcaneus o Heel suspension boot to: - [employer] Additional Orders / Instructions Wound #1 Right Calcaneus o Increase protein intake. o Activity as tolerated [name], [name] ([id number]) o Other: - Include these vitamins in your diet, MVI, ZINC, Vit C, Vit A Home Health Wound #1 Right Calcaneus o Continue Home Health  Visits - [facility] o Home Health Nurse may visit PRN to address patientos wound care needs. o FACE TO FACE ENCOUNTER: MEDICARE and MEDICAID PATIENTS: I certify that this patient is under my care and that I had a face-to-face encounter that meets the physician face-to-face encounter requirements with this patient on this date. The encounter with the patient was in whole or in part for the following MEDICAL CONDITION: (primary reason for Home Healthcare) MEDICAL NECESSITY: I certify, that based on my findings, NURSING services are a medically necessary home health service. HOME BOUND STATUS: I certify that my clinical findings support that this patient is homebound (i.e., Due to illness or injury, pt requires aid of supportive devices such as crutches, cane, wheelchairs, walkers, the use of special transportation or  the assistance of another person to leave their place of residence. There is a normal inability to leave the home and doing so requires considerable and taxing effort. Other absences are for medical reasons / religious services and are infrequent or of short duration when for other reasons). o If current dressing causes regression in wound condition, may D/C ordered dressing product/s and apply Normal Saline Moist Dressing daily until next Wound Healing Center / Other MD appointment. Notify Wound Healing Center of regression in wound condition at [phone]. o Please direct any NON-WOUND related issues/requests for orders to patient's Primary Care Physician Medications-please add to medication list. Wound #1 Right Calcaneus o Other: - Medihoney Patient Medications Allergies: Bactrim, azithromycin, penicillin, Sulfa (Sulfonamide Antibiotics), Vibramycin Notifications Medication Indication Start End Santyl 09/14/2015 DOSE topical 250 unit/gram ointment - ointment topical as directed MediHoney (honey) only if unable to 09/14/2015 afford santyl DOSE topical 100 % paste -  paste topical as directed Electronic Signature(s) Signed: 09/14/2015 4:26:41 PM By: Evlyn Kanner MD, FACS Entered By: Evlyn Kanner on 09/14/2015 16:26:41 [name] ([id number]) -------------------------------------------------------------------------------- Problem List Details Patient Name: [name] Date of Service: 09/14/2015 3:00 PM Medical Record Number: [id number] Patient Account Number: [id number] Date of Birth/Sex: [birth year] (82 y.o. Female) Treating RN: Curtis Sites Primary Care Physician: Rolm Gala Other Clinician: Referring Physician: Rolm Gala Treating Physician/Extender: Rudene Re in Treatment: 0 Active Problems ICD-10 Encounter Code Description Active Date Diagnosis L89.613 Pressure ulcer of right heel, stage 3 09/14/2015 Yes E44.1 Mild protein-calorie malnutrition 09/14/2015 Yes Inactive Problems Resolved Problems Electronic Signature(s) Signed: 09/14/2015 4:20:55 PM By: Evlyn Kanner MD, FACS Entered By: Evlyn Kanner on 09/14/2015 16:20:55 [name] ([id number]) -------------------------------------------------------------------------------- Progress Note Details Patient Name: [name] Date of Service: 09/14/2015 3:00 PM Medical Record Number: [id number] Patient Account Number: [id number] Date of Birth/Sex: 06/18/1924 (82 y.o. Female) Treating RN: Curtis Sites Primary Care Physician: Rolm Gala Other Clinician: Referring Physician: Rolm Gala Treating Physician/Extender: Rudene Re in Treatment: 0 Subjective Chief Complaint Information obtained from Patient Patient is at the clinic for treatment of an open pressure ulcer right heel which she's had for about 8 weeks History of Present Illness (HPI) The following HPI elements were documented for the patient's wound: Location: right heel Quality: Patient reports experiencing a dull pain to affected area(s). Severity: Patient states wound are  getting worse. Duration: Patient has had the wound for < 8 weeks prior to presenting for treatment Timing: Pain in wound is Intermittent (comes and goes Context: The wound occurred when the patient the hip fracture and was in traction for a few days while in hospital Modifying Factors: Other treatment(s) tried include:local care symptomatic treatment Associated Signs and Symptoms: Patient reports having increase discharge. 82 year old patient has been referred to as by her PCP for a pressure ulcer of the right heel. his had this for about 8 weeks Has been treated with duoderm. Has been there for a while it's getting more painful and larger and she also was seen for a recent urinary tract infection. She was given ciprofloxacin for this and then changed on to nitrofurantoin. She has a past medical history of rheumatic fever, hyperlipidemia, hypertension, scoliosis and osteopenia. never been a smoker. Wound History Patient presents with 1 open wound that has been present for approximately 2 months. Patient has been treating wound in the following manner: unknown. Laboratory tests have not been performed in the last month. Patient reportedly has not tested positive for an antibiotic resistant organism. Patient  reportedly has not tested positive for osteomyelitis. Patient reportedly has not had testing performed to evaluate circulation in the legs. Patient History Information obtained from Caregiver. Allergies Bactrim, azithromycin, penicillin, Sulfa (Sulfonamide Antibiotics), Vibramycin [name], [name] ([id number]) Social History Never smoker, Marital Status - Widowed, Alcohol Use - Never, Drug Use - No History, Caffeine Use - Never. Medical History Cardiovascular Patient has history of Deep Vein Thrombosis, Hypertension Neurologic Patient has history of Dementia Oncologic Denies history of Received Chemotherapy, Received Radiation Hospitalization/Surgery History - 07/14/2015, [facility],  broken hip. Review of Systems (ROS) Constitutional Symptoms (General Health) The patient has no complaints or symptoms. Eyes The patient has no complaints or symptoms. Ear/Nose/Mouth/Throat The patient has no complaints or symptoms. Hematologic/Lymphatic The patient has no complaints or symptoms. Respiratory The patient has no complaints or symptoms. Cardiovascular The patient has no complaints or symptoms. Gastrointestinal The patient has no complaints or symptoms. Endocrine The patient has no complaints or symptoms. Genitourinary Complains or has symptoms of Incontinence/dribbling. Immunological The patient has no complaints or symptoms. Integumentary (Skin) The patient has no complaints or symptoms. Musculoskeletal The patient has no complaints or symptoms. Neurologic The patient has no complaints or symptoms. Oncologic The patient has no complaints or symptoms. Psychiatric The patient has no complaints or symptoms. Medications [name], [name] ([id number]) memantine 10 mg tablet oral 1 1 tablet oral two times daily acetaminophen 325 mg tablet oral tablet oral dicyclomine 20 mg tablet oral one half tablet oral (10 mg.) three times daily as needed quetiapine 25 mg tablet oral 1 1 tablet oral nightly amlodipine 2.5 mg tablet oral 1 1 tablet oral daily as directed Eliquis 2.5 mg tablet oral 1 1 tablet oral two times daily folic acid 1 mg tablet oral 1 1 tablet oral daily Geritol Complete 16 mg iron-0.38 mg tablet oral tablet oral magnesium 250 mg tablet oral 1 1 tablet oral daily nitrofurantoin macrocrystal 100 mg capsule oral 1 1 capsule oral twotimes daily for seven days Xiidra 5 % eye drops in a dropperette ophthalmic dropperette ophthalmic aspirin 81 mg chewable tablet oral 1 1 tablet,chewable oral potassium chloride ER 20 mEq tablet,extended release oral 1 1 tablet extended release oral two times daily paroxetine 10 mg tablet oral 1 1 tablet oral daily MediHoney  (honey) 100 % topical paste topical paste topical as directed ketoconazole 2 % topical cream topical cream topical nystatin 100,000 unit/gram topical cream topical cream topical apply two times daily Santyl 250 unit/gram topical ointment topical ointment topical as directed Santyl 250 unit/gram topical ointment topical ointment topical cyanocobalamin (vit B-12) 1,000 mcg/mL injection solution injection solution injection monthly Objective Constitutional Pulse regular. Respirations normal and unlabored. Afebrile. Vitals Time Taken: 3:18 PM, Height: 59 in, Source: Stated, Weight: 108 lbs, Source: Stated, BMI: 21.8, Temperature: 97.5 F, Pulse: 85 bpm, Respiratory Rate: 16 breaths/min, Blood Pressure: 126/61 mmHg. Eyes Nonicteric. Reactive to light. Ears, Nose, Mouth, and Throat Lips, teeth, and gums WNL.Marland Kitchen. Moist mucosa without lesions. Neck supple and nontender. No palpable supraclavicular or cervical adenopathy. Normal sized without goiter. Respiratory WNL. No retractions.. Breath sounds WNL, No rubs, rales, rhonchi, or wheeze.. Chest Breasts symmetical and no nipple discharge.. Breast tissue WNL, no masses, lumps, or tenderness.. Gastrointestinal (GI) [name], [name] ([id number]) Abdomen without masses or tenderness.. No liver or spleen enlargement or tenderness.. Lymphatic No adneopathy. No adenopathy. No adenopathy. Musculoskeletal Adexa without tenderness or enlargement.. Digits and nails w/o clubbing, cyanosis, infection, petechiae, ischemia, or inflammatory conditions.Marland Kitchen. Psychiatric Judgement and insight Intact.. No evidence of  depression, anxiety, or agitation.. General Notes: has a thick eschar and necrotic skin on the right calcaneum which has a layer of separation and using a forcep and number in blade I was able to remove the entire necrotic debris and there is no probing down to bone. Integumentary (Hair, Skin) No suspicious lesions. No crepitus or fluctuance. No  peri-wound warmth or erythema. No masses.. Wound #1 status is Open. Original cause of wound was Pressure Injury. The wound is located on the Right Calcaneus. The wound measures 2.7cm length x 2.6cm width x 0.2cm depth; 5.513cm^2 area and 1.103cm^3 volume. The wound is limited to skin breakdown. There is no tunneling or undermining noted. There is a large amount of serous drainage noted. The wound margin is flat and intact. There is no granulation within the wound bed. There is a large (67-100%) amount of necrotic tissue within the wound bed including Eschar and Adherent Slough. The periwound skin appearance exhibited: Moist, Erythema. The periwound skin appearance did not exhibit: Callus, Crepitus, Excoriation, Fluctuance, Friable, Induration, Localized Edema, Rash, Scarring, Dry/Scaly, Maceration, Atrophie Blanche, Cyanosis, Ecchymosis, Hemosiderin Staining, Mottled, Pallor, Rubor. The surrounding wound skin color is noted with erythema which is circumferential. Periwound temperature was noted as No Abnormality. The periwound has tenderness on palpation. Assessment Active Problems ICD-10 L89.613 - Pressure ulcer of right heel, stage 3 E44.1 - Mild protein-calorie malnutrition 82 year old patient with stage III pressure injury to the right heel and the patient's family would like appropriate treatment but not aggressive workup. I have recommended: [name], [name] ([id number]) 1. Santyl ointment locally to be applied daily. If this is not affordable she should use Medihoney. 2. Offloading has been discussed in great detail 3. Adequate protein, vitamin A, vitamin C and zinc 4. regular visits to the wound center she has 2 attentive daughters at the bedside who have agreed on the treatment plan Procedures Wound #1 Wound #1 is a Pressure Ulcer located on the Right Calcaneus . There was a Skin/Subcutaneous Tissue Debridement ([id number]) debridement with total area of 7.02 sq cm performed by  Evlyn Kanner, MD. with the following instrument(s): Blade and Forceps to remove Viable and Non-Viable tissue/material including Exudate, Fibrin/Slough, Eschar, Skin, and Subcutaneous after achieving pain control using Lidocaine 4% Topical Solution. A time out was conducted prior to the start of the procedure. A Minimum amount of bleeding was controlled with Pressure. The procedure was tolerated well with a pain level of 0 throughout and a pain level of 0 following the procedure. Post Debridement Measurements: 2.7cm length x 2.6cm width x 0.2cm depth; 1.103cm^3 volume. Post debridement Stage noted as Category/Stage III. Post procedure Diagnosis Wound #1: Same as Pre-Procedure Plan Wound Cleansing: Wound #1 Right Calcaneus: Cleanse wound with mild soap and water May Shower, gently pat wound dry prior to applying new dressing. May shower with protection. Anesthetic: Wound #1 Right Calcaneus: Topical Lidocaine 4% cream applied to wound bed prior to debridement - In Clinic Primary Wound Dressing: Wound #1 Right Calcaneus: Santyl Ointment - In clinic, can continue to use if able to afford it. Medihoney gel - Use if not able to afford Santyl Secondary Dressing: Wound #1 Right Calcaneus: Gauze and Kerlix/Conform Dressing Change Frequency: Wound #1 Right Calcaneus: Change dressing every day. Follow-up Appointments: Wound #1 Right Calcaneus: Return Appointment in 1 week. Off-Loading: [name], [name] ([id number]) Wound #1 Right Calcaneus: Heel suspension boot to: - [employer] Additional Orders / Instructions: Wound #1 Right Calcaneus: Increase protein intake. Activity as tolerated Other: - Include  these vitamins in your diet, MVI, ZINC, Vit C, Vit A Home Health: Wound #1 Right Calcaneus: Continue Home Health Visits - [facility] Nurse may visit PRN to address patient s wound care needs. FACE TO FACE ENCOUNTER: MEDICARE and MEDICAID PATIENTS: I certify that this patient is  under my care and that I had a face-to-face encounter that meets the physician face-to-face encounter requirements with this patient on this date. The encounter with the patient was in whole or in part for the following MEDICAL CONDITION: (primary reason for Home Healthcare) MEDICAL NECESSITY: I certify, that based on my findings, NURSING services are a medically necessary home health service. HOME BOUND STATUS: I certify that my clinical findings support that this patient is homebound (i.e., Due to illness or injury, pt requires aid of supportive devices such as crutches, cane, wheelchairs, walkers, the use of special transportation or the assistance of another person to leave their place of residence. There is a normal inability to leave the home and doing so requires considerable and taxing effort. Other absences are for medical reasons / religious services and are infrequent or of short duration when for other reasons). If current dressing causes regression in wound condition, may D/C ordered dressing product/s and apply Normal Saline Moist Dressing daily until next Wound Healing Center / Other MD appointment. Notify Wound Healing Center of regression in wound condition at [phone]. Please direct any NON-WOUND related issues/requests for orders to patient's Primary Care Physician Medications-please add to medication list.: Wound #1 Right Calcaneus: Other: - Medihoney The following medication(s) was prescribed: Santyl topical 250 unit/gram ointment ointment topical as directed starting 09/14/2015 MediHoney (honey) topical 100 % paste paste topical as directed for only if unable to afford santyl starting 09/14/2015 82 year old patient with stage III pressure injury to the right heel and the patient's family would like appropriate treatment but not aggressive workup. I have recommended: 1. Santyl ointment locally to be applied daily. If this is not affordable she should use Medihoney. 2.  Offloading has been discussed in great detail 3. Adequate protein, vitamin A, vitamin C and zinc 4. regular visits to the wound center she has 2 attentive daughters at the bedside who have agreed on the treatment plan [name], [name] ([id number]) Electronic Signature(s) Signed: 09/14/2015 4:31:50 PM By: Evlyn Kanner MD, FACS Entered By: Evlyn Kanner on 09/14/2015 16:31:50 [name] ([id number]) -------------------------------------------------------------------------------- ROS/PFSH Details Patient Name: [name] Date of Service: 09/14/2015 3:00 PM Medical Record Number: [id number] Patient Account Number: [id number] Date of Birth/Sex: Oct 21, 1924 (82 y.o. Female) Treating RN: Curtis Sites Primary Care Physician: Rolm Gala Other Clinician: Referring Physician: Rolm Gala Treating Physician/Extender: Rudene Re in Treatment: 0 Information Obtained From Caregiver Wound History Do you currently have one or more open woundso Yes How many open wounds do you currently haveo 1 Approximately how long have you had your woundso 2 months How have you been treating your wound(s) until nowo unknown Has your wound(s) ever healed and then re-openedo No Have you had any lab work done in the past montho No Have you tested positive for an antibiotic resistant organism (MRSA, VRE)o No Have you tested positive for osteomyelitis (bone infection)o No Have you had any tests for circulation on your legso No Genitourinary Complaints and Symptoms: Positive for: Incontinence/dribbling Constitutional Symptoms (General Health) Complaints and Symptoms: No Complaints or Symptoms Eyes Complaints and Symptoms: No Complaints or Symptoms Ear/Nose/Mouth/Throat Complaints and Symptoms: No Complaints or Symptoms Hematologic/Lymphatic Complaints and Symptoms: No Complaints or  Symptoms Respiratory Complaints and Symptoms: No Complaints or Symptoms [name], [name]  ([id number]) Cardiovascular Complaints and Symptoms: No Complaints or Symptoms Medical History: Positive for: Deep Vein Thrombosis; Hypertension Gastrointestinal Complaints and Symptoms: No Complaints or Symptoms Endocrine Complaints and Symptoms: No Complaints or Symptoms Immunological Complaints and Symptoms: No Complaints or Symptoms Integumentary (Skin) Complaints and Symptoms: No Complaints or Symptoms Musculoskeletal Complaints and Symptoms: No Complaints or Symptoms Neurologic Complaints and Symptoms: No Complaints or Symptoms Medical History: Positive for: Dementia Oncologic Complaints and Symptoms: No Complaints or Symptoms Medical History: Negative for: Received Chemotherapy; Received Radiation Psychiatric [name], [name] ([id number]) Complaints and Symptoms: No Complaints or Symptoms Immunizations Immunization Notes: up to date Hospitalization / Surgery History Name of Hospital Purpose of Hospitalization/Surgery Date [facility] broken hip 07/14/2015 Family and Social History Never smoker; Marital Status - Widowed; Alcohol Use: Never; Drug Use: No History; Caffeine Use: Never; Financial Concerns: No; Food, Clothing or Shelter Needs: No; Support System Lacking: No; Transportation Concerns: No; Advanced Directives: No; Patient does not want information on Advanced Directives; Living Will: Yes (Not Provided); Medical Power of Attorney: Yes - Kandace Blitz - dtr (Not Provided) Physician Affirmation I have reviewed and agree with the above information. Electronic Signature(s) Signed: 09/14/2015 4:38:29 PM By: Curtis Sites Signed: 09/14/2015 4:56:45 PM By: Evlyn Kanner MD, FACS Entered By: Evlyn Kanner on 09/14/2015 15:34:04 [name] ([id number]) -------------------------------------------------------------------------------- SuperBill Details Patient Name: [name] Date of Service: 09/14/2015 Medical Record Number: [id number] Patient Account Number:  [id number] Date of Birth/Sex: 28-Jan-1925 (82 y.o. Female) Treating RN: Curtis Sites Primary Care Physician: Rolm Gala Other Clinician: Referring Physician: Rolm Gala Treating Physician/Extender: Rudene Re in Treatment: 0 Diagnosis Coding ICD-10 Codes Code Description [phone] Pressure ulcer of right heel, stage 3 E44.1 Mild protein-calorie malnutrition Facility Procedures CPT4 Code: 03474259 Description: 99213 - WOUND CARE VISIT-LEV 3 EST PT Modifier: Quantity: 1 CPT4 Code: 56387564 Description: 11042 - DEB SUBQ TISSUE 20 SQ CM/< ICD-10 Description Diagnosis L89.613 Pressure ulcer of right heel, stage 3 E44.1 Mild protein-calorie malnutrition Modifier: Quantity: 1 Physician Procedures CPT4 Code: 3329518 Description: 99204 - WC PHYS LEVEL 4 - NEW PT ICD-10 Description Diagnosis L89.613 Pressure ulcer of right heel, stage 3 E44.1 Mild protein-calorie malnutrition Modifier: 25 Quantity: 1 CPT4 Code: 8416606 Description: 11042 - WC PHYS SUBQ TISS 20 SQ CM ICD-10 Description Diagnosis L89.613 Pressure ulcer of right heel, stage 3 E44.1 Mild protein-calorie malnutrition Modifier: Quantity: 1 Electronic Signature(s) Signed: 09/14/2015 4:56:19 PM By: Elpidio Eric BSN, RN Signed: 09/14/2015 4:56:45 PM By: Evlyn Kanner MD, FACS Previous Signature: 09/14/2015 4:32:08 PM Version By: Evlyn Kanner MD, FACS [name] ([id number]) Entered By: Elpidio Eric on 09/14/2015 16:56:19

## 2015-09-21 ENCOUNTER — Encounter (HOSPITAL_BASED_OUTPATIENT_CLINIC_OR_DEPARTMENT_OTHER): Payer: Medicare Other | Admitting: General Surgery

## 2015-09-21 DIAGNOSIS — L89613 Pressure ulcer of right heel, stage 3: Secondary | ICD-10-CM

## 2015-09-21 NOTE — Progress Notes (Signed)
See I heal 

## 2015-09-22 NOTE — Progress Notes (Signed)
Colleen QualeSLEY, Tommi L. ([id number]) Visit Report for 09/21/2015 Arrival Information Details Patient Name: Colleen QualeSLEY, Charissa L. Date of Service: 09/21/2015 8:30 AM Medical Record Number: [id number] Patient Account Number: [id number] Date of Birth/Sex: 03/10/1924 (82 y.o. Female) Treating RN: Curtis Sitesorthy, Joanna Primary Care Physician: Rolm GalaGrandis, Heidi Other Clinician: Referring Physician: Rolm GalaGrandis, Heidi Treating Physician/Extender: Elayne SnarePARKER, PETER Weeks in Treatment: 1 Visit Information History Since Last Visit Added or deleted any medications: No Patient Arrived: Wheel Chair Any new allergies or adverse reactions: No Arrival Time: 08:30 Had a fall or experienced change in No Accompanied By: dtr activities of daily living that may affect Transfer Assistance: Manual risk of falls: Patient Identification Verified: Yes Signs or symptoms of abuse/neglect since last No Secondary Verification Process Yes visito Completed: Hospitalized since last visit: No Patient Has Alerts: Yes Pain Present Now: No Patient Alerts: Patient on Blood Thinner eliquis Electronic Signature(s) Signed: 09/21/2015 4:58:15 PM By: Curtis Sitesorthy, Joanna Entered By: Curtis Sitesorthy, Joanna on 09/21/2015 08:36:45 [name], [name] ([id number]) -------------------------------------------------------------------------------- Encounter Discharge Information Details Patient Name: [name], [name] Date of Service: 09/21/2015 8:30 AM Medical Record Number: [id number] Patient Account Number: [id number] Date of Birth/Sex: 04/19/1924 (82 y.o. Female) Treating RN: Curtis Sitesorthy, Joanna Primary Care Physician: Rolm GalaGrandis, Heidi Other Clinician: Referring Physician: Rolm GalaGrandis, Heidi Treating Physician/Extender: Elayne SnarePARKER, PETER Weeks in Treatment: 1 Encounter Discharge Information Items Discharge Pain Level: 0 Discharge Condition: Stable Ambulatory Status: Wheelchair Discharge Destination: Home Transportation: Private Auto Accompanied By: dtr Schedule Follow-up  Appointment: Yes Medication Reconciliation completed and provided to Patient/Care No Brean Carberry: Provided on Clinical Summary of Care: 09/21/2015 Form Type Recipient Paper Patient DI Electronic Signature(s) Signed: 09/21/2015 9:18:36 AM By: Ardath SaxParker, Peter MD Previous Signature: 09/21/2015 9:05:30 AM Version By: Gwenlyn PerkingMoore, Shelia Entered By: Ardath SaxParker, Peter on 09/21/2015 09:18:36 [name], Kanda L. ([id number]) -------------------------------------------------------------------------------- Lower Extremity Assessment Details Patient Name: [name], [name] Date of Service: 09/21/2015 8:30 AM Medical Record Number: [id number] Patient Account Number: [id number] Date of Birth/Sex: 05/13/1924 (82 y.o. Female) Treating RN: Curtis Sitesorthy, Joanna Primary Care Physician: Rolm GalaGrandis, Heidi Other Clinician: Referring Physician: Rolm GalaGrandis, Heidi Treating Physician/Extender: Ardath SaxPARKER, PETER Weeks in Treatment: 1 Edema Assessment Assessed: [Left: No] [Right: No] Edema: [Left: N] [Right: o] Vascular Assessment Pulses: Posterior Tibial Dorsalis Pedis Palpable: [Right:Yes] Extremity colors, hair growth, and conditions: Extremity Color: [Right:Normal] Hair Growth on Extremity: [Right:No] Temperature of Extremity: [Right:Cool] Capillary Refill: [Right:< 3 seconds] Electronic Signature(s) Signed: 09/21/2015 4:58:15 PM By: Curtis Sitesorthy, Joanna Entered By: Curtis Sitesorthy, Joanna on 09/21/2015 08:39:09 Washam, [name] ([id number]) -------------------------------------------------------------------------------- Multi Wound Chart Details Patient Name: [name], [name] Date of Service: 09/21/2015 8:30 AM Medical Record Number: [id number] Patient Account Number: [id number] Date of Birth/Sex: 01/28/1925 (82 y.o. Female) Treating RN: Clover MealyAfful, RN, BSN, [place] Sinkita Primary Care Physician: Rolm GalaGrandis, Heidi Other Clinician: Referring Physician: Rolm GalaGrandis, Heidi Treating Physician/Extender: Elayne SnarePARKER, PETER Weeks in Treatment: 1 Vital Signs Height(in):  59 Pulse(bpm): 104 Weight(lbs): 108 Blood Pressure 132/105 (mmHg): Body Mass Index(BMI): 22 Temperature(F): 97.8 Respiratory Rate 16 (breaths/min): Photos: [N/A:N/A] Wound Location: Right Calcaneus N/A N/A Wounding Event: Pressure Injury N/A N/A Primary Etiology: Pressure Ulcer N/A N/A Comorbid History: Deep Vein Thrombosis, N/A N/A Hypertension, Dementia Date Acquired: 07/14/2015 N/A N/A Weeks of Treatment: 1 N/A N/A Wound Status: Open N/A N/A Measurements L x W x D 2.8x2.5x0.3 N/A N/A (cm) Area (cm) : 5.498 N/A N/A Volume (cm) : 1.649 N/A N/A % Reduction in Area: 0.30% N/A N/A % Reduction in Volume: -49.50% N/A N/A Classification: Category/Stage III N/A N/A Exudate Amount: Large N/A N/A Exudate Type: Serous N/A N/A Exudate Color: amber N/A N/A  Wound Margin: Flat and Intact N/A N/A Granulation Amount: None Present (0%) N/A N/A Necrotic Amount: Large (67-100%) N/A N/A Necrotic Tissue: Eschar, Adherent Slough N/A N/A Husser, Aymara L. ([id number]) Exposed Structures: Fascia: No N/A N/A Fat: No Tendon: No Muscle: No Joint: No Bone: No Limited to Skin Breakdown Epithelialization: None N/A N/A Periwound Skin Texture: Edema: No N/A N/A Excoriation: No Induration: No Callus: No Crepitus: No Fluctuance: No Friable: No Rash: No Scarring: No Periwound Skin Moist: Yes N/A N/A Moisture: Maceration: No Dry/Scaly: No Periwound Skin Color: Erythema: Yes N/A N/A Atrophie Blanche: No Cyanosis: No Ecchymosis: No Hemosiderin Staining: No Mottled: No Pallor: No Rubor: No Erythema Location: Circumferential N/A N/A Temperature: No Abnormality N/A N/A Tenderness on Yes N/A N/A Palpation: Wound Preparation: Ulcer Cleansing: N/A N/A Rinsed/Irrigated with Saline Topical Anesthetic Applied: Other: lidocaine 4% Treatment Notes Electronic Signature(s) Signed: 09/21/2015 12:00:13 PM By: Elpidio Eric BSN, RN Entered By: Elpidio Eric on 09/21/2015 08:51:08 [name] ([id number]) -------------------------------------------------------------------------------- Multi-Disciplinary Care Plan Details Patient Name: [name] Date of Service: 09/21/2015 8:30 AM Medical Record Number: [id number] Patient Account Number: [id number] Date of Birth/Sex: 12-May-1924 (82 y.o. Female) Treating RN: Afful, RN, BSN, [place] Sink Primary Care Physician: Rolm Gala Other Clinician: Referring Physician: Rolm Gala Treating Physician/Extender: Elayne Snare in Treatment: 1 Active Inactive Abuse / Safety / Falls / Self Care Management Nursing Diagnoses: Impaired home maintenance Impaired physical mobility Potential for falls Self care deficit: actual or potential Goals: Patient will remain injury free Date Initiated: 09/14/2015 Goal Status: Active Patient/caregiver will verbalize understanding of skin care regimen Date Initiated: 09/14/2015 Goal Status: Active Patient/caregiver will verbalize/demonstrate measure taken to improve self care Date Initiated: 09/14/2015 Goal Status: Active Patient/caregiver will verbalize/demonstrate measures taken to improve the patient's personal safety Date Initiated: 09/14/2015 Goal Status: Active Patient/caregiver will verbalize/demonstrate measures taken to prevent injury and/or falls Date Initiated: 09/14/2015 Goal Status: Active Patient/caregiver will verbalize/demonstrate understanding of what to do in case of emergency Date Initiated: 09/14/2015 Goal Status: Active Interventions: Assess fall risk on admission and as needed Assess: immobility, friction, shearing, incontinence upon admission and as needed Assess impairment of mobility on admission and as needed per policy Assess self care needs on admission and as needed Provide education on basic hygiene Provide education on personal and home safety DELPHINE, SIZEMORE ([id number]) Provide education on safe transfers Notes: Orientation to the Wound Care  Program Nursing Diagnoses: Knowledge deficit related to the wound healing center program Goals: Patient/caregiver will verbalize understanding of the Wound Healing Center Program Date Initiated: 09/14/2015 Goal Status: Active Interventions: Provide education on orientation to the wound center Notes: Pressure Nursing Diagnoses: Knowledge deficit related to causes and risk factors for pressure ulcer development Knowledge deficit related to management of pressures ulcers Potential for impaired tissue integrity related to pressure, friction, moisture, and shear Goals: Patient will remain free from development of additional pressure ulcers Date Initiated: 09/14/2015 Goal Status: Active Patient will remain free of pressure ulcers Date Initiated: 09/14/2015 Goal Status: Active Patient/caregiver will verbalize risk factors for pressure ulcer development Date Initiated: 09/14/2015 Goal Status: Active Patient/caregiver will verbalize understanding of pressure ulcer management Date Initiated: 09/14/2015 Goal Status: Active Interventions: Assess: immobility, friction, shearing, incontinence upon admission and as needed Assess offloading mechanisms upon admission and as needed Assess potential for pressure ulcer upon admission and as needed Provide education on pressure ulcers Treatment Activities: RAFAELA, DINIUS ([id number]) Patient referred for pressure reduction/relief devices : 09/14/2015 Patient referred for seating evaluation to ensure proper  offloading : 09/14/2015 Pressure reduction/relief device ordered : 09/14/2015 Notes: Wound/Skin Impairment Nursing Diagnoses: Impaired tissue integrity Knowledge deficit related to ulceration/compromised skin integrity Goals: Patient/caregiver will verbalize understanding of skin care regimen Date Initiated: 09/14/2015 Goal Status: Active Ulcer/skin breakdown will have a volume reduction of 30% by week 4 Date Initiated: 09/14/2015 Goal Status:  Active Ulcer/skin breakdown will have a volume reduction of 50% by week 8 Date Initiated: 09/14/2015 Goal Status: Active Ulcer/skin breakdown will have a volume reduction of 80% by week 12 Date Initiated: 09/14/2015 Goal Status: Active Ulcer/skin breakdown will heal within 14 weeks Date Initiated: 09/14/2015 Goal Status: Active Interventions: Assess patient/caregiver ability to obtain necessary supplies Assess patient/caregiver ability to perform ulcer/skin care regimen upon admission and as needed Assess ulceration(s) every visit Provide education on ulcer and skin care Treatment Activities: Referred to DME Radonna Bracher for dressing supplies : 09/14/2015 Skin care regimen initiated : 09/14/2015 Topical wound management initiated : 09/14/2015 Notes: Electronic Signature(s) Signed: 09/21/2015 12:00:13 PM By: Elpidio EricAfful, Rita BSN, RN Entered By: Elpidio EricAfful, Rita on 09/21/2015 08:50:46 [name], Gerald L. ([id number] Mallick) Mainer, [name] ([id number]) -------------------------------------------------------------------------------- Pain Assessment Details Patient Name: [name], [name] Date of Service: 09/21/2015 8:30 AM Medical Record Number: [id number] Patient Account Number: [id number] Date of Birth/Sex: 01/16/1925 (82 y.o. Female) Treating RN: Curtis Sitesorthy, Joanna Primary Care Physician: Rolm GalaGrandis, Heidi Other Clinician: Referring Physician: Rolm GalaGrandis, Heidi Treating Physician/Extender: Elayne SnarePARKER, PETER Weeks in Treatment: 1 Active Problems Location of Pain Severity and Description of Pain Patient Has Paino No Site Locations Pain Management and Medication Current Pain Management: Notes Topical or injectable lidocaine is offered to patient for acute pain when surgical debridement is performed. If needed, Patient is instructed to use over the counter pain medication for the following 24-48 hours after debridement. Wound care MDs do not prescribed pain medications. Patient has chronic pain or uncontrolled pain.  Patient has been instructed to make an appointment with their Primary Care Physician for pain management. Electronic Signature(s) Signed: 09/21/2015 4:58:15 PM By: Curtis Sitesorthy, Joanna Entered By: Curtis Sitesorthy, Joanna on 09/21/2015 08:37:07 [name], Ireanna L. ([id number]) -------------------------------------------------------------------------------- Patient/Caregiver Education Details Patient Name: [name], [name] Date of Service: 09/21/2015 8:30 AM Medical Record Number: [id number] Patient Account Number: [id number] Date of Birth/Gender: 10/11/1924 (82 y.o. Female) Treating RN: Curtis Sitesorthy, Joanna Primary Care Physician: Rolm GalaGrandis, Heidi Other Clinician: Referring Physician: Rolm GalaGrandis, Heidi Treating Physician/Extender: Elayne SnarePARKER, PETER Weeks in Treatment: 1 Education Assessment Education Provided To: Caregiver Education Topics Provided Wound/Skin Impairment: Handouts: Other: wound care and pressure relief Methods: Demonstration, Explain/Verbal Responses: State content correctly Electronic Signature(s) Signed: 09/22/2015 8:09:12 AM By: Ardath SaxParker, Peter MD Entered By: Ardath SaxParker, Peter on 09/21/2015 09:18:46 [name], [name] ([id number]) -------------------------------------------------------------------------------- Wound Assessment Details Patient Name: [name], [name] Date of Service: 09/21/2015 8:30 AM Medical Record Number: [id number] Patient Account Number: [id number] Date of Birth/Sex: 07/26/1924 (82 y.o. Female) Treating RN: Curtis Sitesorthy, Joanna Primary Care Physician: Rolm GalaGrandis, Heidi Other Clinician: Referring Physician: Rolm GalaGrandis, Heidi Treating Physician/Extender: Ardath SaxPARKER, PETER Weeks in Treatment: 1 Wound Status Wound Number: 1 Primary Pressure Ulcer Etiology: Wound Location: Right Calcaneus Wound Status: Open Wounding Event: Pressure Injury Comorbid Deep Vein Thrombosis, Date Acquired: 07/14/2015 History: Hypertension, Dementia Weeks Of Treatment: 1 Clustered Wound: No Photos Wound  Measurements Length: (cm) 2.8 Width: (cm) 2.5 Depth: (cm) 0.3 Area: (cm) 5.498 Volume: (cm) 1.649 % Reduction in Area: 0.3% % Reduction in Volume: -49.5% Epithelialization: None Tunneling: No Undermining: No Wound Description Classification: Category/Stage III Wound Margin: Flat and Intact Exudate Amount: Large Exudate Type: Serous Exudate Color: amber Foul Odor After Cleansing:  No Wound Bed Granulation Amount: None Present (0%) Exposed Structure Necrotic Amount: Large (67-100%) Fascia Exposed: No Necrotic Quality: Eschar, Adherent Slough Fat Layer Exposed: No Tendon Exposed: No Muscle Exposed: No Brower, Lalania L. ([id number]) Joint Exposed: No Bone Exposed: No Limited to Skin Breakdown Periwound Skin Texture Texture Color No Abnormalities Noted: No No Abnormalities Noted: No Callus: No Atrophie Blanche: No Crepitus: No Cyanosis: No Excoriation: No Ecchymosis: No Fluctuance: No Erythema: Yes Friable: No Erythema Location: Circumferential Induration: No Hemosiderin Staining: No Localized Edema: No Mottled: No Rash: No Pallor: No Scarring: No Rubor: No Moisture Temperature / Pain No Abnormalities Noted: No Temperature: No Abnormality Dry / Scaly: No Tenderness on Palpation: Yes Maceration: No Moist: Yes Wound Preparation Ulcer Cleansing: Rinsed/Irrigated with Saline Topical Anesthetic Applied: Other: lidocaine 4%, Treatment Notes Wound #1 (Right Calcaneus) 1. Cleansed with: Clean wound with Normal Saline 2. Anesthetic Topical Lidocaine 4% cream to wound bed prior to debridement 4. Dressing Applied: Santyl Ointment 5. Secondary Dressing Applied Guaze, ABD and kerlix/Conform 7. Secured with Secretary/administrator) Signed: 09/21/2015 4:58:15 PM By: Curtis Sites Entered By: Curtis Sites on 09/21/2015 08:44:00 [name] ([id number]) -------------------------------------------------------------------------------- Vitals  Details Patient Name: [name] Date of Service: 09/21/2015 8:30 AM Medical Record Number: [id number] Patient Account Number: [id number] Date of Birth/Sex: 11/06/1924 (82 y.o. Female) Treating RN: Curtis Sites Primary Care Physician: Rolm Gala Other Clinician: Referring Physician: Rolm Gala Treating Physician/Extender: Elayne Snare in Treatment: 1 Vital Signs Time Taken: 08:37 Temperature (F): 97.8 Height (in): 59 Pulse (bpm): 104 Weight (lbs): 108 Respiratory Rate (breaths/min): 16 Body Mass Index (BMI): 21.8 Blood Pressure (mmHg): 132/105 Reference Range: 80 - 120 mg / dl Electronic Signature(s) Signed: 09/21/2015 4:58:15 PM By: Curtis Sites Entered By: Curtis Sites on 09/21/2015 08:37:35

## 2015-09-22 NOTE — Progress Notes (Signed)
KEASHA, Chavez ([id number]) Visit Report for 09/21/2015 Chief Complaint Document Details Patient Name: [name], [name] Date of Service: 09/21/2015 8:30 AM Medical Record Number: [id number] Patient Account Number: [id number] Date of Birth/Sex: 1924-12-09 (82 y.o. Female) Treating RN: Curtis Sites Primary Care Physician: Rolm Gala Other Clinician: Referring Physician: Rolm Gala Treating Physician/Extender: Elayne Snare in Treatment: 1 Information Obtained from: Patient Chief Complaint Patient is at the clinic for treatment of an open pressure ulcer right heel which she's had for about 8 weeks Electronic Signature(s) Signed: 09/21/2015 9:15:35 AM By: Ardath Sax MD Entered By: Ardath Sax on 09/21/2015 09:15:35 [name] ([id number]) -------------------------------------------------------------------------------- Debridement Details Patient Name: [name] Date of Service: 09/21/2015 8:30 AM Medical Record Number: [id number] Patient Account Number: [id number] Date of Birth/Sex: Oct 05, 1924 (82 y.o. Female) Treating RN: Afful, RN, BSN, [place] Primary Care Physician: Rolm Gala Other Clinician: Referring Physician: Rolm Gala Treating Physician/Extender: Elayne Snare in Treatment: 1 Debridement Performed for Wound #1 Right Calcaneus Assessment: Performed By: Physician Ardath Sax, MD Debridement: Debridement Pre-procedure Yes Verification/Time Out Taken: Start Time: 08:50 Pain Control: Lidocaine 4% Topical Solution Level: Skin/Subcutaneous Tissue Total Area Debrided (L x 2.8 (cm) x 2.5 (cm) = 7 (cm) W): Tissue and other Non-Viable, Eschar, Fibrin/Slough, Subcutaneous material debrided: Instrument: Forceps, Scissors Bleeding: Minimum Hemostasis Achieved: Pressure End Time: 08:55 Procedural Pain: 0 Post Procedural Pain: 0 Response to Treatment: Procedure was tolerated well Post Debridement Measurements of Total  Wound Length: (cm) 2.8 Stage: Category/Stage III Width: (cm) 2.5 Depth: (cm) 0.3 Volume: (cm) 1.649 Post Procedure Diagnosis Same as Pre-procedure Electronic Signature(s) Signed: 09/21/2015 12:00:13 PM By: Elpidio Eric BSN, RN Signed: 09/22/2015 8:09:12 AM By: Ardath Sax MD Entered By: Elpidio Eric on 09/21/2015 08:54:09 [name] ([id number]) -------------------------------------------------------------------------------- HPI Details Patient Name: [name] Date of Service: 09/21/2015 8:30 AM Medical Record Number: [id number] Patient Account Number: [id number] Date of Birth/Sex: 09-10-1924 (82 y.o. Female) Treating RN: Curtis Sites Primary Care Physician: Rolm Gala Other Clinician: Referring Physician: Rolm Gala Treating Physician/Extender: Elayne Snare in Treatment: 1 History of Present Illness Location: right heel Quality: Patient reports experiencing a dull pain to affected area(s). Severity: Patient states wound are getting worse. Duration: Patient has had the wound for < 8 weeks prior to presenting for treatment Timing: Pain in wound is Intermittent (comes and goes Context: The wound occurred when the patient the hip fracture and was in traction for a few days while in hospital Modifying Factors: Other treatment(s) tried include:local care symptomatic treatment Associated Signs and Symptoms: Patient reports having increase discharge. HPI Description: 82 year old patient has been referred to as by her PCP for a pressure ulcer of the right heel. his had this for about 8 weeks Has been treated with duoderm. Has been there for a while it's getting more painful and larger and she also was seen for a recent urinary tract infection. She was given ciprofloxacin for this and then changed on to nitrofurantoin. She has a past medical history of rheumatic fever, hyperlipidemia, hypertension, scoliosis and osteopenia. never been a smoker. Electronic  Signature(s) Signed: 09/21/2015 9:15:46 AM By: Ardath Sax MD Entered By: Ardath Sax on 09/21/2015 09:15:46 [name] ([id number]) -------------------------------------------------------------------------------- Physical Exam Details Patient Name: [name] Date of Service: 09/21/2015 8:30 AM Medical Record Number: [id number] Patient Account Number: [id number] Date of Birth/Sex: 1924/11/16 (82 y.o. Female) Treating RN: Curtis Sites Primary Care Physician: Rolm Gala Other Clinician: Referring Physician: Rolm Gala Treating Physician/Extender: Ardath Sax Weeks in Treatment:  1 Electronic Signature(s) Signed: 09/21/2015 9:15:52 AM By: Ardath SaxParker, Nyaire Denbleyker MD Entered By: Ardath SaxParker, Cyndal Kasson on 09/21/2015 09:15:52 Colleen QualeISLEY, [name] ([id number]) -------------------------------------------------------------------------------- Physician Orders Details Patient Name: Colleen QualeISLEY, [name] Date of Service: 09/21/2015 8:30 AM Medical Record Number: [id number] Patient Account Number: [id number] Date of Birth/Sex: 12/20/1924 (82 y.o. Female) Treating RN: Afful, RN, BSN, [place] Primary Care Physician: Rolm GalaGrandis, Heidi Other Clinician: Referring Physician: Rolm GalaGrandis, Heidi Treating Physician/Extender: Elayne SnarePARKER, Krupa Stege Weeks in Treatment: 1 Verbal / Phone Orders: Yes Clinician: Afful, RN, BSN, Rita Read Back and Verified: Yes Diagnosis Coding Wound Cleansing Wound #1 Right Calcaneus o Cleanse wound with mild soap and water o May Shower, gently pat wound dry prior to applying new dressing. o May shower with protection. Anesthetic Wound #1 Right Calcaneus o Topical Lidocaine 4% cream applied to wound bed prior to debridement - In Clinic Primary Wound Dressing Wound #1 Right Calcaneus o Santyl Ointment - In clinic, can continue to use if able to afford it. o Medihoney gel - Use if not able to afford Santyl Secondary Dressing Wound #1 Right Calcaneus o Gauze and  Kerlix/Conform Dressing Change Frequency Wound #1 Right Calcaneus o Change dressing every day. Follow-up Appointments Wound #1 Right Calcaneus o Return Appointment in 1 week. Off-Loading Wound #1 Right Calcaneus o Heel suspension boot to: - [employer] Additional Orders / Instructions Wound #1 Right Calcaneus o Increase protein intake. o Activity as tolerated Jamerson, Mayar L. ([id number]) o Other: - Include these vitamins in your diet, MVI, ZINC, Vit C, Vit A Home Health Wound #1 Right Calcaneus o Continue Home Health Visits - [facility] o Home Health Nurse may visit PRN to address patientos wound care needs. o FACE TO FACE ENCOUNTER: MEDICARE and MEDICAID PATIENTS: I certify that this patient is under my care and that I had a face-to-face encounter that meets the physician face-to-face encounter requirements with this patient on this date. The encounter with the patient was in whole or in part for the following MEDICAL CONDITION: (primary reason for Home Healthcare) MEDICAL NECESSITY: I certify, that based on my findings, NURSING services are a medically necessary home health service. HOME BOUND STATUS: I certify that my clinical findings support that this patient is homebound (i.e., Due to illness or injury, pt requires aid of supportive devices such as crutches, cane, wheelchairs, walkers, the use of special transportation or the assistance of another person to leave their place of residence. There is a normal inability to leave the home and doing so requires considerable and taxing effort. Other absences are for medical reasons / religious services and are infrequent or of short duration when for other reasons). o If current dressing causes regression in wound condition, may D/C ordered dressing product/s and apply Normal Saline Moist Dressing daily until next Wound Healing Center / Other MD appointment. Notify Wound Healing Center of regression in wound  condition at [phone]. o Please direct any NON-WOUND related issues/requests for orders to patient's Primary Care Physician Electronic Signature(s) Signed: 09/21/2015 12:00:13 PM By: Elpidio EricAfful, Rita BSN, RN Signed: 09/22/2015 8:09:12 AM By: Ardath SaxParker, Jaselyn Nahm MD Entered By: Elpidio EricAfful, Rita on 09/21/2015 08:55:06 Colleen QualeISLEY, Samaya L. ([id number]) -------------------------------------------------------------------------------- Problem List Details Patient Name: Colleen QualeISLEY, Nancey L. Date of Service: 09/21/2015 8:30 AM Medical Record Number: [id number] Patient Account Number: [id number] Date of Birth/Sex: 03/14/1924 (82 y.o. Female) Treating RN: Curtis Sitesorthy, Joanna Primary Care Physician: Rolm GalaGrandis, Heidi Other Clinician: Referring Physician: Rolm GalaGrandis, Heidi Treating Physician/Extender: Elayne SnarePARKER, Julie Nay Weeks in Treatment: 1 Active Problems ICD-10 Encounter Code Description Active Date Diagnosis [phone]  Pressure ulcer of right heel, stage 3 09/14/2015 Yes E44.1 Mild protein-calorie malnutrition 09/14/2015 Yes Inactive Problems Resolved Problems Electronic Signature(s) Signed: 09/21/2015 9:15:25 AM By: Ardath Sax MD Entered By: Ardath Sax on 09/21/2015 09:15:25 [name] ([id number]) -------------------------------------------------------------------------------- Progress Note Details Patient Name: [name] Date of Service: 09/21/2015 8:30 AM Medical Record Number: [id number] Patient Account Number: [id number] Date of Birth/Sex: [birth year] (82 y.o. Female) Treating RN: Curtis Sites Primary Care Physician: Rolm Gala Other Clinician: Referring Physician: Rolm Gala Treating Physician/Extender: Elayne Snare in Treatment: 1 Subjective Chief Complaint Information obtained from Patient Patient is at the clinic for treatment of an open pressure ulcer right heel which she's had for about 8 weeks History of Present Illness (HPI) The following HPI elements were documented for the  patient's wound: Location: right heel Quality: Patient reports experiencing a dull pain to affected area(s). Severity: Patient states wound are getting worse. Duration: Patient has had the wound for < 8 weeks prior to presenting for treatment Timing: Pain in wound is Intermittent (comes and goes Context: The wound occurred when the patient the hip fracture and was in traction for a few days while in hospital Modifying Factors: Other treatment(s) tried include:local care symptomatic treatment Associated Signs and Symptoms: Patient reports having increase discharge. 82 year old patient has been referred to as by her PCP for a pressure ulcer of the right heel. his had this for about 8 weeks Has been treated with duoderm. Has been there for a while it's getting more painful and larger and she also was seen for a recent urinary tract infection. She was given ciprofloxacin for this and then changed on to nitrofurantoin. She has a past medical history of rheumatic fever, hyperlipidemia, hypertension, scoliosis and osteopenia. never been a smoker. Objective Constitutional Vitals Time Taken: 8:37 AM, Height: 59 in, Weight: 108 lbs, BMI: 21.8, Temperature: 97.8 F, Pulse: 104 bpm, Respiratory Rate: 16 breaths/min, Blood Pressure: 132/105 mmHg. Integumentary (Hair, Skin) Wound #1 status is Open. Original cause of wound was Pressure Injury. The wound is located on the Right Calcaneus. The wound measures 2.8cm length x 2.5cm width x 0.3cm depth; 5.498cm^2 area and Spates, Charnise L. ([id number]) 1.649cm^3 volume. The wound is limited to skin breakdown. There is no tunneling or undermining noted. There is a large amount of serous drainage noted. The wound margin is flat and intact. There is no granulation within the wound bed. There is a large (67-100%) amount of necrotic tissue within the wound bed including Eschar and Adherent Slough. The periwound skin appearance exhibited: Moist, Erythema. The  periwound skin appearance did not exhibit: Callus, Crepitus, Excoriation, Fluctuance, Friable, Induration, Localized Edema, Rash, Scarring, Dry/Scaly, Maceration, Atrophie Blanche, Cyanosis, Ecchymosis, Hemosiderin Staining, Mottled, Pallor, Rubor. The surrounding wound skin color is noted with erythema which is circumferential. Periwound temperature was noted as No Abnormality. The periwound has tenderness on palpation. Assessment Active Problems ICD-10 L89.613 - Pressure ulcer of right heel, stage 3 E44.1 - Mild protein-calorie malnutrition Procedures Wound #1 Wound #1 is a Pressure Ulcer located on the Right Calcaneus . There was a Skin/Subcutaneous Tissue Debridement ([id number]) debridement with total area of 7 sq cm performed by Ardath Sax, MD. with the following instrument(s): Forceps and Scissors to remove Non-Viable tissue/material including Fibrin/Slough, Eschar, and Subcutaneous after achieving pain control using Lidocaine 4% Topical Solution. A time out was conducted prior to the start of the procedure. A Minimum amount of bleeding was controlled with Pressure. The procedure was tolerated well with a pain level  of 0 throughout and a pain level of 0 following the procedure. Post Debridement Measurements: 2.8cm length x 2.5cm width x 0.3cm depth; 1.649cm^3 volume. Post debridement Stage noted as Category/Stage III. Post procedure Diagnosis Wound #1: Same as Pre-Procedure Stage 3 pressure ulcer right heel 3-4 cm.. Debrided and dressed with Santyl Plan Headley, Carlotta L. ([id number]) Wound Cleansing: Wound #1 Right Calcaneus: Cleanse wound with mild soap and water May Shower, gently pat wound dry prior to applying new dressing. May shower with protection. Anesthetic: Wound #1 Right Calcaneus: Topical Lidocaine 4% cream applied to wound bed prior to debridement - In Clinic Primary Wound Dressing: Wound #1 Right Calcaneus: Santyl Ointment - In clinic, can continue to use if  able to afford it. Medihoney gel - Use if not able to afford Santyl Secondary Dressing: Wound #1 Right Calcaneus: Gauze and Kerlix/Conform Dressing Change Frequency: Wound #1 Right Calcaneus: Change dressing every day. Follow-up Appointments: Wound #1 Right Calcaneus: Return Appointment in 1 week. Off-Loading: Wound #1 Right Calcaneus: Heel suspension boot to: - [employer] Additional Orders / Instructions: Wound #1 Right Calcaneus: Increase protein intake. Activity as tolerated Other: - Include these vitamins in your diet, MVI, ZINC, Vit C, Vit A Home Health: Wound #1 Right Calcaneus: [facility] Nurse may visit PRN to address patient s wound care needs. FACE TO FACE ENCOUNTER: MEDICARE and MEDICAID PATIENTS: I certify that this patient is under my care and that I had a face-to-face encounter that meets the physician face-to-face encounter requirements with this patient on this date. The encounter with the patient was in whole or in part for the following MEDICAL CONDITION: (primary reason for Home Healthcare) MEDICAL NECESSITY: I certify, that based on my findings, NURSING services are a medically necessary home health service. HOME BOUND STATUS: I certify that my clinical findings support that this patient is homebound (i.e., Due to illness or injury, pt requires aid of supportive devices such as crutches, cane, wheelchairs, walkers, the use of special transportation or the assistance of another person to leave their place of residence. There is a normal inability to leave the home and doing so requires considerable and taxing effort. Other absences are for medical reasons / religious services and are infrequent or of short duration when for other reasons). If current dressing causes regression in wound condition, may D/C ordered dressing product/s and apply Normal Saline Moist Dressing daily until next Wound Healing Center / Other MD  appointment. Notify Wound Healing Center of regression in wound condition at [phone]. Please direct any NON-WOUND related issues/requests for orders to patient's Primary Care Physician Colleen QualeSLEY, Nomi L. ([id number]) Follow-Up Appointments: A follow-up appointment should be scheduled. [facility] Patient Clinical [facility] Electronic Signature(s) Signed: 09/21/2015 9:17:40 AM By: Ardath SaxParker, Jakarri Lesko MD Entered By: Ardath SaxParker, Allice Garro on 09/21/2015 09:17:40 Colleen QualeISLEY, Babe L. ([id number]) -------------------------------------------------------------------------------- SuperBill Details Patient Name: Colleen QualeISLEY, Daylee L. Date of Service: 09/21/2015 Medical Record Number: [id number] Patient Account Number: [id number] Date of Birth/Sex: 11/19/1924 (82 y.o. Female) Treating RN: Curtis Sitesorthy, Joanna Primary Care Physician: Rolm GalaGrandis, Heidi Other Clinician: Referring Physician: Rolm GalaGrandis, Heidi Treating Physician/Extender: Elayne SnarePARKER, Rendy Lazard Weeks in Treatment: 1 Diagnosis Coding ICD-10 Codes Code Description [phone] Pressure ulcer of right heel, stage 3 E44.1 Mild protein-calorie malnutrition Facility Procedures CPT4 Code: [id number] Description: 11042 - DEB SUBQ TISSUE 20 SQ CM/< ICD-10 Description Diagnosis E44.1 Mild protein-calorie malnutrition Modifier: Quantity: 1 Physician Procedures CPT4 Code: [id number] Description: 99213 - WC PHYS LEVEL 3 - EST PT  ICD-10 Description Diagnosis L89.613 Pressure ulcer of right heel, stage 3 Modifier: Quantity: 1 CPT4 Code: [id number] Description: 11042 - WC PHYS SUBQ TISS 20 SQ CM ICD-10 Description Diagnosis E44.1 Mild protein-calorie malnutrition Modifier: Quantity: 1 Electronic Signature(s) Signed: 09/21/2015 9:18:11 AM By: Ardath Sax MD Entered By: Ardath Sax on 09/21/2015 09:18:11

## 2015-09-28 ENCOUNTER — Ambulatory Visit
Admission: RE | Admit: 2015-09-28 | Discharge: 2015-09-28 | Disposition: A | Discharge status: Home / Self Care | Payer: Medicare Other | Source: Ambulatory Visit | Attending: Surgery | Admitting: Surgery

## 2015-09-28 ENCOUNTER — Other Ambulatory Visit: Payer: Self-pay | Admitting: Surgery

## 2015-09-28 ENCOUNTER — Encounter: Payer: Medicare Other | Admitting: Surgery

## 2015-09-28 DIAGNOSIS — S90921A Unspecified superficial injury of right foot, initial encounter: Secondary | ICD-10-CM | POA: Diagnosis present

## 2015-09-28 DIAGNOSIS — X58XXXA Exposure to other specified factors, initial encounter: Secondary | ICD-10-CM | POA: Insufficient documentation

## 2015-09-28 DIAGNOSIS — S81801A Unspecified open wound, right lower leg, initial encounter: Secondary | ICD-10-CM

## 2015-09-28 DIAGNOSIS — L89613 Pressure ulcer of right heel, stage 3: Secondary | ICD-10-CM | POA: Diagnosis not present

## 2015-09-29 NOTE — Progress Notes (Signed)
[name], [name] ([id number]) Visit Report for 09/28/2015 Arrival Information Details Patient Name: [name], [name] Date of Service: 09/28/2015 2:15 PM Medical Record Number: [id number] Patient Account Number: [id number] Date of Birth/Sex: 08/18/1924 (82 y.o. Female) Treating RN: Ashok CordiaPinkerton, Debi Primary Care Physician: Rolm GalaGrandis, Heidi Other Clinician: Referring Physician: Rolm GalaGrandis, Heidi Treating Physician/Extender: Rudene ReBritto, Errol Weeks in Treatment: 2 Visit Information History Since Last Visit All ordered tests and consults were completed: No Patient Arrived: Wheel Chair Added or deleted any medications: No Arrival Time: 14:17 Any new allergies or adverse reactions: No Accompanied By: daughter Had a fall or experienced change in No Transfer Assistance: EasyPivot Patient activities of daily living that may affect Lift risk of falls: Patient Identification Verified: Yes Signs or symptoms of abuse/neglect since last No Secondary Verification Process Yes visito Completed: Hospitalized since last visit: No Patient Requires Transmission- No Pain Present Now: No Based Precautions: Patient Has Alerts: Yes Patient Alerts: Patient on Blood Thinner eliquis Electronic Signature(s) Signed: 09/28/2015 5:32:44 PM By: Alejandro MullingPinkerton, Debra Entered By: Alejandro MullingPinkerton, Debra on 09/28/2015 14:22:09 [name], [name] ([id number]) -------------------------------------------------------------------------------- Clinic Level of Care Assessment Details Patient Name: [name], Shelena L. Date of Service: 09/28/2015 2:15 PM Medical Record Number: [id number] Patient Account Number: [id number] Date of Birth/Sex: 07/29/1924 (82 y.o. Female) Treating RN: Afful, RN, BSN, [place] Sinkita Primary Care Physician: Rolm GalaGrandis, Heidi Other Clinician: Referring Physician: Rolm GalaGrandis, Heidi Treating Physician/Extender: Rudene ReBritto, Errol Weeks in Treatment: 2 Clinic Level of Care Assessment Items TOOL 4 Quantity Score []  - Use when only an  EandM is performed on FOLLOW-UP visit 0 ASSESSMENTS - Nursing Assessment / Reassessment X - Reassessment of Co-morbidities (includes updates in patient status) 1 10 X - Reassessment of Adherence to Treatment Plan 1 5 ASSESSMENTS - Wound and Skin Assessment / Reassessment X - Simple Wound Assessment / Reassessment - one wound 1 5 []  - Complex Wound Assessment / Reassessment - multiple wounds 0 []  - Dermatologic / Skin Assessment (not related to wound area) 0 ASSESSMENTS - Focused Assessment []  - Circumferential Edema Measurements - multi extremities 0 []  - Nutritional Assessment / Counseling / Intervention 0 X - Lower Extremity Assessment (monofilament, tuning fork, pulses) 1 5 []  - Peripheral Arterial Disease Assessment (using hand held doppler) 0 ASSESSMENTS - Ostomy and/or Continence Assessment and Care []  - Incontinence Assessment and Management 0 []  - Ostomy Care Assessment and Management (repouching, etc.) 0 PROCESS - Coordination of Care X - Simple Patient / Family Education for ongoing care 1 15 []  - Complex (extensive) Patient / Family Education for ongoing care 0 []  - Staff obtains ChiropractorConsents, Records, Test Results / Process Orders 0 []  - Staff telephones HHA, Nursing Homes / Clarify orders / etc 0 []  - Routine Transfer to another Facility (non-emergent condition) 0 Groll, Terresa L. ([id number]) []  - Routine Hospital Admission (non-emergent condition) 0 []  - New Admissions / Manufacturing engineernsurance Authorizations / Ordering NPWT, Apligraf, etc. 0 []  - Emergency Hospital Admission (emergent condition) 0 []  - Simple Discharge Coordination 0 []  - Complex (extensive) Discharge Coordination 0 PROCESS - Special Needs []  - Pediatric / Minor Patient Management 0 []  - Isolation Patient Management 0 []  - Hearing / Language / Visual special needs 0 []  - Assessment of Community assistance (transportation, D/C planning, etc.) 0 []  - Additional assistance / Altered mentation 0 []  - Support Surface(s)  Assessment (bed, cushion, seat, etc.) 0 INTERVENTIONS - Wound Cleansing / Measurement X - Simple Wound Cleansing - one wound 1 5 []  - Complex Wound Cleansing - multiple wounds 0 X -  Wound Imaging (photographs - any number of wounds) 1 5 []  - Wound Tracing (instead of photographs) 0 []  - Simple Wound Measurement - one wound 0 []  - Complex Wound Measurement - multiple wounds 0 INTERVENTIONS - Wound Dressings X - Small Wound Dressing one or multiple wounds 1 10 []  - Medium Wound Dressing one or multiple wounds 0 []  - Large Wound Dressing one or multiple wounds 0 []  - Application of Medications - topical 0 []  - Application of Medications - injection 0 INTERVENTIONS - Miscellaneous []  - External ear exam 0 Brann, Ernestene L. ([id number]) []  - Specimen Collection (cultures, biopsies, blood, body fluids, etc.) 0 []  - Specimen(s) / Culture(s) sent or taken to Lab for analysis 0 []  - Patient Transfer (multiple staff / Michiel Sites Lift / Similar devices) 0 []  - Simple Staple / Suture removal (25 or less) 0 []  - Complex Staple / Suture removal (26 or more) 0 []  - Hypo / Hyperglycemic Management (close monitor of Blood Glucose) 0 []  - Ankle / Brachial Index (ABI) - do not check if billed separately 0 X - Vital Signs 1 5 Has the patient been seen at the hospital within the last three years: Yes Total Score: 65 Level Of Care: New/Established - Level 2 Electronic Signature(s) Signed: 09/28/2015 5:25:53 PM By: Elpidio Eric BSN, RN Entered By: Elpidio Eric on 09/28/2015 14:34:44 [name] ([id number]) -------------------------------------------------------------------------------- Encounter Discharge Information Details Patient Name: [name] Date of Service: 09/28/2015 2:15 PM Medical Record Number: [id number] Patient Account Number: [id number] Date of Birth/Sex: 08/03/1924 (82 y.o. Female) Treating RN: Phillis Haggis Primary Care Physician: Rolm Gala Other Clinician: Referring  Physician: Rolm Gala Treating Physician/Extender: Rudene Re in Treatment: 2 Encounter Discharge Information Items Discharge Pain Level: 0 Discharge Condition: Stable Ambulatory Status: Wheelchair Discharge Destination: Home Transportation: Private Auto Accompanied By: daughter Schedule Follow-up Appointment: Yes Medication Reconciliation completed and provided to Patient/Care Yes Tanairy Payeur: Provided on Clinical Summary of Care: 09/28/2015 Form Type Recipient Paper Patient DI Electronic Signature(s) Signed: 09/28/2015 2:45:19 PM By: Gwenlyn Perking Entered By: Gwenlyn Perking on 09/28/2015 14:45:19 [name] ([id number]) -------------------------------------------------------------------------------- Lower Extremity Assessment Details Patient Name: [name] Date of Service: 09/28/2015 2:15 PM Medical Record Number: [id number] Patient Account Number: [id number] Date of Birth/Sex: 1924-03-24 (82 y.o. Female) Treating RN: Ashok Cordia, Debi Primary Care Physician: Rolm Gala Other Clinician: Referring Physician: Rolm Gala Treating Physician/Extender: Rudene Re in Treatment: 2 Vascular Assessment Pulses: Posterior Tibial Dorsalis Pedis Palpable: [Right:Yes] Extremity colors, hair growth, and conditions: Extremity Color: [Right:Normal] Temperature of Extremity: [Right:Warm] Capillary Refill: [Right:< 3 seconds] Electronic Signature(s) Signed: 09/28/2015 5:32:44 PM By: Alejandro Mulling Entered By: Alejandro Mulling on 09/28/2015 14:23:32 Giddings, Bani Elbert Ewings ([id number]) -------------------------------------------------------------------------------- Multi Wound Chart Details Patient Name: [name] Date of Service: 09/28/2015 2:15 PM Medical Record Number: [id number] Patient Account Number: [id number] Date of Birth/Sex: 1924/05/02 (82 y.o. Female) Treating RN: Clover Mealy, RN, BSN, [place] Sink Primary Care Physician: Rolm Gala Other  Clinician: Referring Physician: Rolm Gala Treating Physician/Extender: Rudene Re in Treatment: 2 Vital Signs Height(in): 59 Pulse(bpm): 89 Weight(lbs): 108 Blood Pressure 125/69 (mmHg): Body Mass Index(BMI): 22 Temperature(F): Respiratory Rate 16 (breaths/min): Photos: [1:No Photos] [N/A:N/A] Wound Location: [1:Right Calcaneus] [N/A:N/A] Wounding Event: [1:Pressure Injury] [N/A:N/A] Primary Etiology: [1:Pressure Ulcer] [N/A:N/A] Comorbid History: [1:Deep Vein Thrombosis, Hypertension, Dementia] [N/A:N/A] Date Acquired: [1:07/14/2015] [N/A:N/A] Weeks of Treatment: [1:2] [N/A:N/A] Wound Status: [1:Open] [N/A:N/A] Measurements L x W x D 2.3x2.7x0.2 [N/A:N/A] (cm) Area (cm) : [1:4.877] [N/A:N/A] Volume (cm) : [1:0.975] [N/A:N/A] %  Reduction in Area: [1:11.50%] [N/A:N/A] % Reduction in Volume: 11.60% [N/A:N/A] Classification: [1:Category/Stage III] [N/A:N/A] Exudate Amount: [1:Large] [N/A:N/A] Exudate Type: [1:Serous] [N/A:N/A] Exudate Color: [1:amber] [N/A:N/A] Wound Margin: [1:Flat and Intact] [N/A:N/A] Granulation Amount: [1:None Present (0%)] [N/A:N/A] Necrotic Amount: [1:Large (67-100%)] [N/A:N/A] Necrotic Tissue: [1:Eschar, Adherent Slough] [N/A:N/A] Exposed Structures: [1:Fascia: No Fat: No Tendon: No Muscle: No Joint: No Bone: No] [N/A:N/A] Limited to Skin Breakdown Epithelialization: None N/A N/A Periwound Skin Texture: Edema: No N/A N/A Excoriation: No Induration: No Callus: No Crepitus: No Fluctuance: No Friable: No Rash: No Scarring: No Periwound Skin Moist: Yes N/A N/A Moisture: Maceration: No Dry/Scaly: No Periwound Skin Color: Erythema: Yes N/A N/A Atrophie Blanche: No Cyanosis: No Ecchymosis: No Hemosiderin Staining: No Mottled: No Pallor: No Rubor: No Erythema Location: Circumferential N/A N/A Temperature: No Abnormality N/A N/A Tenderness on Yes N/A N/A Palpation: Wound Preparation: Ulcer Cleansing: N/A  N/A Rinsed/Irrigated with Saline Topical Anesthetic Applied: Other: lidocaine 4% Treatment Notes Electronic Signature(s) Signed: 09/28/2015 5:25:53 PM By: Elpidio Eric BSN, RN Entered By: Elpidio Eric on 09/28/2015 14:32:58 [name] ([id number]) -------------------------------------------------------------------------------- Multi-Disciplinary Care Plan Details Patient Name: [name] Date of Service: 09/28/2015 2:15 PM Medical Record Number: [id number] Patient Account Number: [id number] Date of Birth/Sex: 17-Jan-1925 (82 y.o. Female) Treating RN: Afful, RN, BSN, [place] Sink Primary Care Physician: Rolm Gala Other Clinician: Referring Physician: Rolm Gala Treating Physician/Extender: Rudene Re in Treatment: 2 Active Inactive Abuse / Safety / Falls / Self Care Management Nursing Diagnoses: Impaired home maintenance Impaired physical mobility Potential for falls Self care deficit: actual or potential Goals: Patient will remain injury free Date Initiated: 09/14/2015 Goal Status: Active Patient/caregiver will verbalize understanding of skin care regimen Date Initiated: 09/14/2015 Goal Status: Active Patient/caregiver will verbalize/demonstrate measure taken to improve self care Date Initiated: 09/14/2015 Goal Status: Active Patient/caregiver will verbalize/demonstrate measures taken to improve the patient's personal safety Date Initiated: 09/14/2015 Goal Status: Active Patient/caregiver will verbalize/demonstrate measures taken to prevent injury and/or falls Date Initiated: 09/14/2015 Goal Status: Active Patient/caregiver will verbalize/demonstrate understanding of what to do in case of emergency Date Initiated: 09/14/2015 Goal Status: Active Interventions: Assess fall risk on admission and as needed Assess: immobility, friction, shearing, incontinence upon admission and as needed Assess impairment of mobility on admission and as needed per  policy Assess self care needs on admission and as needed Provide education on basic hygiene Provide education on personal and home safety YESLI, VANDERHOFF ([id number]) Provide education on safe transfers Notes: Orientation to the Wound Care Program Nursing Diagnoses: Knowledge deficit related to the wound healing center program Goals: Patient/caregiver will verbalize understanding of the Wound Healing Center Program Date Initiated: 09/14/2015 Goal Status: Active Interventions: Provide education on orientation to the wound center Notes: Pressure Nursing Diagnoses: Knowledge deficit related to causes and risk factors for pressure ulcer development Knowledge deficit related to management of pressures ulcers Potential for impaired tissue integrity related to pressure, friction, moisture, and shear Goals: Patient will remain free from development of additional pressure ulcers Date Initiated: 09/14/2015 Goal Status: Active Patient will remain free of pressure ulcers Date Initiated: 09/14/2015 Goal Status: Active Patient/caregiver will verbalize risk factors for pressure ulcer development Date Initiated: 09/14/2015 Goal Status: Active Patient/caregiver will verbalize understanding of pressure ulcer management Date Initiated: 09/14/2015 Goal Status: Active Interventions: Assess: immobility, friction, shearing, incontinence upon admission and as needed Assess offloading mechanisms upon admission and as needed Assess potential for pressure ulcer upon admission and as needed Provide education on pressure ulcers Treatment Activities: Mayeda, Marrianne  L. ([id number]) Patient referred for pressure reduction/relief devices : 09/14/2015 Patient referred for seating evaluation to ensure proper offloading : 09/14/2015 Pressure reduction/relief device ordered : 09/14/2015 Notes: Wound/Skin Impairment Nursing Diagnoses: Impaired tissue integrity Knowledge deficit related to ulceration/compromised  skin integrity Goals: Patient/caregiver will verbalize understanding of skin care regimen Date Initiated: 09/14/2015 Goal Status: Active Ulcer/skin breakdown will have a volume reduction of 30% by week 4 Date Initiated: 09/14/2015 Goal Status: Active Ulcer/skin breakdown will have a volume reduction of 50% by week 8 Date Initiated: 09/14/2015 Goal Status: Active Ulcer/skin breakdown will have a volume reduction of 80% by week 12 Date Initiated: 09/14/2015 Goal Status: Active Ulcer/skin breakdown will heal within 14 weeks Date Initiated: 09/14/2015 Goal Status: Active Interventions: Assess patient/caregiver ability to obtain necessary supplies Assess patient/caregiver ability to perform ulcer/skin care regimen upon admission and as needed Assess ulceration(s) every visit Provide education on ulcer and skin care Treatment Activities: Referred to DME Nakia Koble for dressing supplies : 09/14/2015 Skin care regimen initiated : 09/14/2015 Topical wound management initiated : 09/14/2015 Notes: Electronic Signature(s) Signed: 09/28/2015 5:25:53 PM By: Elpidio Eric BSN, RN Entered By: Elpidio Eric on 09/28/2015 14:32:39 [name] ([id number] Mallick, Theora Gianotti ([id number]) -------------------------------------------------------------------------------- Pain Assessment Details Patient Name: [name] Date of Service: 09/28/2015 2:15 PM Medical Record Number: [id number] Patient Account Number: [id number] Date of Birth/Sex: 1924-08-22 (82 y.o. Female) Treating RN: Phillis Haggis Primary Care Physician: Rolm Gala Other Clinician: Referring Physician: Rolm Gala Treating Physician/Extender: Rudene Re in Treatment: 2 Active Problems Location of Pain Severity and Description of Pain Patient Has Paino No Site Locations With Dressing Change: No Pain Management and Medication Current Pain Management: Electronic Signature(s) Signed: 09/28/2015 5:32:44 PM By: Alejandro Mulling Entered By: Alejandro Mulling on 09/28/2015 14:22:17 [name] ([id number]) -------------------------------------------------------------------------------- Patient/Caregiver Education Details Patient Name: [name] Date of Service: 09/28/2015 2:15 PM Medical Record Number: [id number] Patient Account Number: [id number] Date of Birth/Gender: November 18, 1924 (82 y.o. Female) Treating RN: Phillis Haggis Primary Care Physician: Rolm Gala Other Clinician: Referring Physician: Rolm Gala Treating Physician/Extender: Rudene Re in Treatment: 2 Education Assessment Education Provided To: Patient Education Topics Provided Wound/Skin Impairment: Handouts: Other: change dressing as ordered Methods: Demonstration, Explain/Verbal Responses: State content correctly Electronic Signature(s) Signed: 09/28/2015 5:32:44 PM By: Alejandro Mulling Entered By: Alejandro Mulling on 09/28/2015 14:29:08 [name] ([id number]) -------------------------------------------------------------------------------- Wound Assessment Details Patient Name: [name] Date of Service: 09/28/2015 2:15 PM Medical Record Number: [id number] Patient Account Number: [id number] Date of Birth/Sex: 10/23/1924 (82 y.o. Female) Treating RN: Ashok Cordia, Debi Primary Care Physician: Rolm Gala Other Clinician: Referring Physician: Rolm Gala Treating Physician/Extender: Rudene Re in Treatment: 2 Wound Status Wound Number: 1 Primary Pressure Ulcer Etiology: Wound Location: Right Calcaneus Wound Status: Open Wounding Event: Pressure Injury Comorbid Deep Vein Thrombosis, Date Acquired: 07/14/2015 History: Hypertension, Dementia Weeks Of Treatment: 2 Clustered Wound: No Photos Photo Uploaded By: Alejandro Mulling on 09/28/2015 15:58:47 Wound Measurements Length: (cm) 2.3 Width: (cm) 2.7 Depth: (cm) 0.2 Area: (cm) 4.877 Volume: (cm) 0.975 % Reduction in Area:  11.5% % Reduction in Volume: 11.6% Epithelialization: None Tunneling: No Undermining: No Wound Description Classification: Category/Stage III Wound Margin: Flat and Intact Exudate Amount: Large Exudate Type: Serous Exudate Color: amber Foul Odor After Cleansing: No Wound Bed Granulation Amount: None Present (0%) Exposed Structure Necrotic Amount: Large (67-100%) Fascia Exposed: No Necrotic Quality: Eschar, Adherent Slough Fat Layer Exposed: No Tendon Exposed: No Khurana, Jamillia L. ([id number]) Muscle Exposed: No Joint Exposed: No  Bone Exposed: No Limited to Skin Breakdown Periwound Skin Texture Texture Color No Abnormalities Noted: No No Abnormalities Noted: No Callus: No Atrophie Blanche: No Crepitus: No Cyanosis: No Excoriation: No Ecchymosis: No Fluctuance: No Erythema: Yes Friable: No Erythema Location: Circumferential Induration: No Hemosiderin Staining: No Localized Edema: No Mottled: No Rash: No Pallor: No Scarring: No Rubor: No Moisture Temperature / Pain No Abnormalities Noted: No Temperature: No Abnormality Dry / Scaly: No Tenderness on Palpation: Yes Maceration: No Moist: Yes Wound Preparation Ulcer Cleansing: Rinsed/Irrigated with Saline Topical Anesthetic Applied: Other: lidocaine 4%, Treatment Notes Wound #1 (Right Calcaneus) 1. Cleansed with: Clean wound with Normal Saline 2. Anesthetic Topical Lidocaine 4% cream to wound bed prior to debridement 4. Dressing Applied: Santyl Ointment 5. Secondary Dressing Applied Dry Gauze Kerlix/Conform 7. Secured with Tape Notes netting Electronic Signature(s) Signed: 09/28/2015 5:32:44 PM By: Alejandro Mulling Entered By: Alejandro Mulling on 09/28/2015 14:27:19 [name] ([id number] Chavez ([id number]) -------------------------------------------------------------------------------- Vitals Details Patient Name: [name] Date of Service: 09/28/2015 2:15 PM Medical  Record Number: [id number] Patient Account Number: [id number] Date of Birth/Sex: 09/28/1924 (82 y.o. Female) Treating RN: Ashok Cordia, Debi Primary Care Physician: Rolm Gala Other Clinician: Referring Physician: Rolm Gala Treating Physician/Extender: Rudene Re in Treatment: 2 Vital Signs Time Taken: 14:22 Pulse (bpm): 89 Height (in): 59 Respiratory Rate (breaths/min): 16 Weight (lbs): 108 Blood Pressure (mmHg): 125/69 Body Mass Index (BMI): 21.8 Reference Range: 80 - 120 mg / dl Electronic Signature(s) Signed: 09/28/2015 5:32:44 PM By: Alejandro Mulling Entered By: Alejandro Mulling on 09/28/2015 14:22:31

## 2015-09-29 NOTE — Progress Notes (Signed)
[name], [name] ([id number]) Visit Report for 09/28/2015 Chief Complaint Document Details Patient Name: [name], [name] Date of Service: 09/28/2015 2:15 PM Medical Record Number: [id number] Patient Account Number: [id number] Date of Birth/Sex: 01/27/1925 (82 y.o. Female) Treating RN: Phillis HaggisPinkerton, Debi Primary Care Physician: Rolm GalaGrandis, Heidi Other Clinician: Referring Physician: Rolm GalaGrandis, Heidi Treating Physician/Extender: Rudene ReBritto, Bobi Daudelin Weeks in Treatment: 2 Information Obtained from: Patient Chief Complaint Patient is at the clinic for treatment of an open pressure ulcer right heel which she's had for about 8 weeks Electronic Signature(s) Signed: 09/28/2015 2:53:19 PM By: Evlyn KannerBritto, Leo Fray MD, FACS Entered By: Evlyn KannerBritto, Avion Patella on 09/28/2015 14:53:19 [name], [name] ([id number]) -------------------------------------------------------------------------------- HPI Details Patient Name: [name], [name] Date of Service: 09/28/2015 2:15 PM Medical Record Number: [id number] Patient Account Number: [id number] Date of Birth/Sex: 04/28/1924 (82 y.o. Female) Treating RN: Phillis HaggisPinkerton, Debi Primary Care Physician: Rolm GalaGrandis, Heidi Other Clinician: Referring Physician: Rolm GalaGrandis, Heidi Treating Physician/Extender: Rudene ReBritto, Taylor Levick Weeks in Treatment: 2 History of Present Illness Location: right heel Quality: Patient reports experiencing a dull pain to affected area(s). Severity: Patient states wound are getting worse. Duration: Patient has had the wound for < 8 weeks prior to presenting for treatment Timing: Pain in wound is Intermittent (comes and goes Context: The wound occurred when the patient the hip fracture and was in traction for a few days while in hospital Modifying Factors: Other treatment(s) tried include:local care symptomatic treatment Associated Signs and Symptoms: Patient reports having increase discharge. HPI Description: [age] year old patient has been referred to as by her PCP for a pressure  ulcer of the right heel. his had this for about 8 weeks Has been treated with duoderm. Has been there for a while it's getting more painful and larger and she also was seen for a recent urinary tract infection. She was given ciprofloxacin for this and then changed on to nitrofurantoin. She has a past medical history of rheumatic fever, hyperlipidemia, hypertension, scoliosis and osteopenia. never been a smoker. Electronic Signature(s) Signed: 09/28/2015 2:53:25 PM By: Evlyn KannerBritto, Rush Salce MD, FACS Entered By: Evlyn KannerBritto, Ryli Standlee on 09/28/2015 14:53:24 [name], [name] ([id number]) -------------------------------------------------------------------------------- Physical Exam Details Patient Name: [name], [name] Date of Service: 09/28/2015 2:15 PM Medical Record Number: [id number] Patient Account Number: [id number] Date of Birth/Sex: 10/23/1924 (82 y.o. Female) Treating RN: Phillis HaggisPinkerton, Debi Primary Care Physician: Rolm GalaGrandis, Heidi Other Clinician: Referring Physician: Rolm GalaGrandis, Heidi Treating Physician/Extender: Rudene ReBritto, Lilyonna Steidle Weeks in Treatment: 2 Constitutional . Pulse regular. Respirations normal and unlabored. Afebrile. . Eyes Nonicteric. Reactive to light. Ears, Nose, Mouth, and Throat Lips, teeth, and gums WNL.Marland Kitchen. Moist mucosa without lesions. Neck supple and nontender. No palpable supraclavicular or cervical adenopathy. Normal sized without goiter. Respiratory WNL. No retractions.. Cardiovascular Pedal Pulses WNL. No clubbing, cyanosis or edema. Lymphatic No adneopathy. No adenopathy. No adenopathy. Musculoskeletal Adexa without tenderness or enlargement.. Digits and nails w/o clubbing, cyanosis, infection, petechiae, ischemia, or inflammatory conditions.. Integumentary (Hair, Skin) No suspicious lesions. No crepitus or fluctuance. No peri-wound warmth or erythema. No masses.Marland Kitchen. Psychiatric Judgement and insight Intact.. No evidence of depression, anxiety, or agitation.. Notes wound in the  right heel continues to have significant subcutaneous debris but it is too tender to sharply remove at this stage. Electronic Signature(s) Signed: 09/28/2015 2:54:16 PM By: Evlyn KannerBritto, Ishia Tenorio MD, FACS Entered By: Evlyn KannerBritto, Ewing Fandino on 09/28/2015 14:54:16 [name], Amantha L. ([id number]) -------------------------------------------------------------------------------- Physician Orders Details Patient Name: [name], [name] Date of Service: 09/28/2015 2:15 PM Medical Record Number: [id number] Patient Account Number: [id number] Date of Birth/Sex: 11/27/1924 (82 y.o. Female) Treating RN: Afful, RN,  BSN, [place] Sink Primary Care Physician: Rolm Gala Other Clinician: Referring Physician: Rolm Gala Treating Physician/Extender: Rudene Re in Treatment: 2 Verbal / Phone Orders: Yes Clinician: Afful, RN, BSN, Rita Read Back and Verified: Yes Diagnosis Coding Wound Cleansing Wound #1 Right Calcaneus o Cleanse wound with mild soap and water o May Shower, gently pat wound dry prior to applying new dressing. o May shower with protection. Anesthetic Wound #1 Right Calcaneus o Topical Lidocaine 4% cream applied to wound bed prior to debridement - In Clinic Primary Wound Dressing Wound #1 Right Calcaneus o Santyl Ointment - In clinic, can continue to use if able to afford it. o Medihoney gel - Use if not able to afford Santyl Secondary Dressing Wound #1 Right Calcaneus o Gauze and Kerlix/Conform Dressing Change Frequency Wound #1 Right Calcaneus o Change dressing every day. Follow-up Appointments Wound #1 Right Calcaneus o Return Appointment in 1 week. Off-Loading Wound #1 Right Calcaneus o Heel suspension boot to: - [employer] Additional Orders / Instructions Wound #1 Right Calcaneus o Increase protein intake. o Activity as tolerated [name], [name] ([id number]) o Other: - Include these vitamins in your diet, MVI, ZINC, Vit C, Vit A Home Health Wound #1 Right  Calcaneus o Continue Home Health Visits - [facility] o Home Health Nurse may visit PRN to address patientos wound care needs. o FACE TO FACE ENCOUNTER: MEDICARE and MEDICAID PATIENTS: I certify that this patient is under my care and that I had a face-to-face encounter that meets the physician face-to-face encounter requirements with this patient on this date. The encounter with the patient was in whole or in part for the following MEDICAL CONDITION: (primary reason for Home Healthcare) MEDICAL NECESSITY: I certify, that based on my findings, NURSING services are a medically necessary home health service. HOME BOUND STATUS: I certify that my clinical findings support that this patient is homebound (i.e., Due to illness or injury, pt requires aid of supportive devices such as crutches, cane, wheelchairs, walkers, the use of special transportation or the assistance of another person to leave their place of residence. There is a normal inability to leave the home and doing so requires considerable and taxing effort. Other absences are for medical reasons / religious services and are infrequent or of short duration when for other reasons). o If current dressing causes regression in wound condition, may D/C ordered dressing product/s and apply Normal Saline Moist Dressing daily until next Wound Healing Center / Other MD appointment. Notify Wound Healing Center of regression in wound condition at [phone]. o Please direct any NON-WOUND related issues/requests for orders to patient's Primary Care Physician Radiology o X-ray, foot - right foot Electronic Signature(s) Signed: 09/28/2015 4:46:57 PM By: Evlyn Kanner MD, FACS Signed: 09/28/2015 5:25:53 PM By: Elpidio Eric BSN, RN Entered By: Elpidio Eric on 09/28/2015 14:35:39 Colleen Quale ([id number]) -------------------------------------------------------------------------------- Problem List Details Patient Name: Colleen Quale Date of Service: 09/28/2015 2:15 PM Medical Record Number: [id number] Patient Account Number: [id number] Date of Birth/Sex: Oct 11, 1924 (82 y.o. Female) Treating RN: Phillis Haggis Primary Care Physician: Rolm Gala Other Clinician: Referring Physician: Rolm Gala Treating Physician/Extender: Rudene Re in Treatment: 2 Active Problems ICD-10 Encounter Code Description Active Date Diagnosis L89.613 Pressure ulcer of right heel, stage 3 09/14/2015 Yes E44.1 Mild protein-calorie malnutrition 09/14/2015 Yes Inactive Problems Resolved Problems Electronic Signature(s) Signed: 09/28/2015 2:53:12 PM By: Evlyn Kanner MD, FACS Entered By: Evlyn Kanner on 09/28/2015 14:53:11 Zupko, Indra L. ([id number]) -------------------------------------------------------------------------------- Progress Note Details Patient Name: [name],  [name] Date of Service: 09/28/2015 2:15 PM Medical Record Number: [id number] Patient Account Number: [id number] Date of Birth/Sex: [birth year] (82 y.o. Female) Treating RN: Phillis Haggis Primary Care Physician: Rolm Gala Other Clinician: Referring Physician: Rolm Gala Treating Physician/Extender: Rudene Re in Treatment: 2 Subjective Chief Complaint Information obtained from Patient Patient is at the clinic for treatment of an open pressure ulcer right heel which she's had for about 8 weeks History of Present Illness (HPI) The following HPI elements were documented for the patient's wound: Location: right heel Quality: Patient reports experiencing a dull pain to affected area(s). Severity: Patient states wound are getting worse. Duration: Patient has had the wound for < 8 weeks prior to presenting for treatment Timing: Pain in wound is Intermittent (comes and goes Context: The wound occurred when the patient the hip fracture and was in traction for a few days while in hospital Modifying Factors: Other treatment(s) tried  include:local care symptomatic treatment Associated Signs and Symptoms: Patient reports having increase discharge. 82 year old patient has been referred to as by her PCP for a pressure ulcer of the right heel. his had this for about 8 weeks Has been treated with duoderm. Has been there for a while it's getting more painful and larger and she also was seen for a recent urinary tract infection. She was given ciprofloxacin for this and then changed on to nitrofurantoin. She has a past medical history of rheumatic fever, hyperlipidemia, hypertension, scoliosis and osteopenia. never been a smoker. Objective Constitutional Pulse regular. Respirations normal and unlabored. Afebrile. Vitals Time Taken: 2:22 PM, Height: 59 in, Weight: 108 lbs, BMI: 21.8, Pulse: 89 bpm, Respiratory Rate: 16 breaths/min, Blood Pressure: 125/69 mmHg. Eyes Bahr, Semone L. ([id number]) Nonicteric. Reactive to light. Ears, Nose, Mouth, and Throat Lips, teeth, and gums WNL.Marland Kitchen Moist mucosa without lesions. Neck supple and nontender. No palpable supraclavicular or cervical adenopathy. Normal sized without goiter. Respiratory WNL. No retractions.. Cardiovascular Pedal Pulses WNL. No clubbing, cyanosis or edema. Lymphatic No adneopathy. No adenopathy. No adenopathy. Musculoskeletal Adexa without tenderness or enlargement.. Digits and nails w/o clubbing, cyanosis, infection, petechiae, ischemia, or inflammatory conditions.Marland Kitchen Psychiatric Judgement and insight Intact.. No evidence of depression, anxiety, or agitation.. General Notes: wound in the right heel continues to have significant subcutaneous debris but it is too tender to sharply remove at this stage. Integumentary (Hair, Skin) No suspicious lesions. No crepitus or fluctuance. No peri-wound warmth or erythema. No masses.. Wound #1 status is Open. Original cause of wound was Pressure Injury. The wound is located on the Right Calcaneus. The wound measures 2.3cm  length x 2.7cm width x 0.2cm depth; 4.877cm^2 area and 0.975cm^3 volume. The wound is limited to skin breakdown. There is no tunneling or undermining noted. There is a large amount of serous drainage noted. The wound margin is flat and intact. There is no granulation within the wound bed. There is a large (67-100%) amount of necrotic tissue within the wound bed including Eschar and Adherent Slough. The periwound skin appearance exhibited: Moist, Erythema. The periwound skin appearance did not exhibit: Callus, Crepitus, Excoriation, Fluctuance, Friable, Induration, Localized Edema, Rash, Scarring, Dry/Scaly, Maceration, Atrophie Blanche, Cyanosis, Ecchymosis, Hemosiderin Staining, Mottled, Pallor, Rubor. The surrounding wound skin color is noted with erythema which is circumferential. Periwound temperature was noted as No Abnormality. The periwound has tenderness on palpation. Assessment Active Problems [name], Josslynn L. ([id number]) ICD-10 L89.613 - Pressure ulcer of right heel, stage 3 E44.1 - Mild protein-calorie malnutrition Plan Wound Cleansing: Wound #1 Right Calcaneus: Cleanse  wound with mild soap and water May Shower, gently pat wound dry prior to applying new dressing. May shower with protection. Anesthetic: Wound #1 Right Calcaneus: Topical Lidocaine 4% cream applied to wound bed prior to debridement - In Clinic Primary Wound Dressing: Wound #1 Right Calcaneus: Santyl Ointment - In clinic, can continue to use if able to afford it. Medihoney gel - Use if not able to afford Santyl Secondary Dressing: Wound #1 Right Calcaneus: Gauze and Kerlix/Conform Dressing Change Frequency: Wound #1 Right Calcaneus: Change dressing every day. Follow-up Appointments: Wound #1 Right Calcaneus: Return Appointment in 1 week. Off-Loading: Wound #1 Right Calcaneus: Heel suspension boot to: - [employer] Additional Orders / Instructions: Wound #1 Right Calcaneus: Increase protein  intake. Activity as tolerated Other: - Include these vitamins in your diet, MVI, ZINC, Vit C, Vit A Home Health: Wound #1 Right Calcaneus: Continue Home Health Visits - [facility] Nurse may visit PRN to address patient s wound care needs. FACE TO FACE ENCOUNTER: MEDICARE and MEDICAID PATIENTS: I certify that this patient is under my care and that I had a face-to-face encounter that meets the physician face-to-face encounter requirements with this patient on this date. The encounter with the patient was in whole or in part for the following MEDICAL CONDITION: (primary reason for Home Healthcare) MEDICAL NECESSITY: I certify, that based on my findings, NURSING services are a medically necessary home health service. HOME BOUND STATUS: I certify that my clinical findings support that this patient is homebound (i.e., Due to Brandon, Lilliahna L. ([id number]) illness or injury, pt requires aid of supportive devices such as crutches, cane, wheelchairs, walkers, the use of special transportation or the assistance of another person to leave their place of residence. There is a normal inability to leave the home and doing so requires considerable and taxing effort. Other absences are for medical reasons / religious services and are infrequent or of short duration when for other reasons). If current dressing causes regression in wound condition, may D/C ordered dressing product/s and apply Normal Saline Moist Dressing daily until next Wound Healing Center / Other MD appointment. Notify Wound Healing Center of regression in wound condition at [phone]. Please direct any NON-WOUND related issues/requests for orders to patient's Primary Care Physician Radiology ordered were: X-ray, foot - right foot I have recommended: 1. Santyl ointment locally to be applied daily. If this is not affordable she should use Medihoney. 2. Offloading has been discussed in great detail 3. Adequate protein, vitamin  A, vitamin C and zinc 4. I have recommended an x-ray of the right heel 5. regular visits to the wound center Electronic Signature(s) Signed: 09/28/2015 2:54:46 PM By: Evlyn Kanner MD, FACS Entered By: Evlyn Kanner on 09/28/2015 14:54:46 Cullars, Aloni Elbert Ewings ([id number]) -------------------------------------------------------------------------------- SuperBill Details Patient Name: Colleen Quale Date of Service: 09/28/2015 Medical Record Number: [id number] Patient Account Number: [id number] Date of Birth/Sex: 29-Jul-1924 (82 y.o. Female) Treating RN: Phillis Haggis Primary Care Physician: Rolm Gala Other Clinician: Referring Physician: Rolm Gala Treating Physician/Extender: Rudene Re in Treatment: 2 Diagnosis Coding ICD-10 Codes Code Description [id number] Pressure ulcer of right heel, stage 3 E44.1 Mild protein-calorie malnutrition Facility Procedures CPT4 Code: [id number] Description: [id number] - WOUND CARE VISIT-LEV 2 EST PT Modifier: Quantity: 1 Physician Procedures CPT4 Code: [id number] Description: 99213 - WC PHYS LEVEL 3 - EST PT ICD-10 Description Diagnosis L89.613 Pressure ulcer of right heel, stage 3 E44.1 Mild protein-calorie malnutrition Modifier: Quantity: 1 Electronic Signature(s) Signed: 09/28/2015 2:55:02 PM By: Evlyn Kanner  MD, FACS Entered By: Evlyn Kanner on 09/28/2015 14:55:01

## 2015-10-05 ENCOUNTER — Encounter: Payer: Medicare Other | Admitting: Surgery

## 2015-10-05 DIAGNOSIS — L89613 Pressure ulcer of right heel, stage 3: Secondary | ICD-10-CM | POA: Diagnosis not present

## 2015-10-05 NOTE — Progress Notes (Addendum)
SHARLINE, LEHANE ([id number]) Visit Report for 10/05/2015 Debridement Details Patient Name: [name], [name] Date of Service: 10/05/2015 2:15 PM Medical Record Number: [id number] Patient Account Number: [id number] Date of Birth/Sex: [birth year] (82 y.o. Female) Treating RN: Afful, RN, BSN, [place] Primary Care Physician: Rolm Gala Other Clinician: Referring Physician: Rolm Gala Treating Physician/Extender: Rudene Re in Treatment: 3 Debridement Performed for Wound #1 Right Calcaneus Assessment: Performed By: Physician Evlyn Kanner, MD Debridement: Debridement Pre-procedure Yes - 14:33 Verification/Time Out Taken: Start Time: 14:34 Pain Control: Lidocaine 4% Topical Solution Level: Skin/Subcutaneous Tissue Total Area Debrided (L x 2.5 (cm) x 2.6 (cm) = 6.5 (cm) W): Tissue and other Non-Viable, Exudate, Fibrin/Slough, Subcutaneous material debrided: Instrument: Curette, Forceps, Scissors Bleeding: Minimum Hemostasis Achieved: Pressure End Time: 14:40 Procedural Pain: 0 Post Procedural Pain: 0 Response to Treatment: Procedure was tolerated well Post Debridement Measurements of Total Wound Length: (cm) 2.5 Stage: Category/Stage III Width: (cm) 2.6 Depth: (cm) 0.3 Volume: (cm) 1.532 Character of Wound/Ulcer Post Requires Further Debridement: Debridement Severity of Tissue Post Limited to breakdown of skin Debridement: Post Procedure Diagnosis Same as Pre-procedure Electronic Signature(s) RECIE, CIRRINCIONE ([id number]) Signed: 10/05/2015 4:38:34 PM By: Evlyn Kanner MD, FACS Signed: 10/05/2015 5:32:33 PM By: Elpidio Eric BSN, RN Entered By: Elpidio Eric on 10/05/2015 14:38:22 [name] ([id number]) -------------------------------------------------------------------------------- HPI Details Patient Name: [name] Date of Service: 10/05/2015 2:15 PM Medical Record Number: [id number] Patient Account Number: [id number] Date of Birth/Sex:  [birth year] (82 y.o. Female) Treating RN: Curtis Sites Primary Care Physician: Rolm Gala Other Clinician: Referring Physician: Rolm Gala Treating Physician/Extender: Rudene Re in Treatment: 3 History of Present Illness Location: right heel Quality: Patient reports experiencing a dull pain to affected area(s). Severity: Patient states wound are getting worse. Duration: Patient has had the wound for < 8 weeks prior to presenting for treatment Timing: Pain in wound is Intermittent (comes and goes Context: The wound occurred when the patient the hip fracture and was in traction for a few days while in hospital Modifying Factors: Other treatment(s) tried include:local care symptomatic treatment Associated Signs and Symptoms: Patient reports having increase discharge. HPI Description: 82 year old patient has been referred to as by her PCP for a pressure ulcer of the right heel. his had this for about 8 weeks Has been treated with duoderm. Has been there for a while it's getting more painful and larger and she also was seen for a recent urinary tract infection. She was given ciprofloxacin for this and then changed on to nitrofurantoin. She has a past medical history of rheumatic fever, hyperlipidemia, hypertension, scoliosis and osteopenia. never been a smoker. 10/05/2015 -- x-ray of the right heel -- IMPRESSION:No osseous finding of significance. Abnormal appearance of the soft tissues posterior to the heel which could be due to infection or trauma. I do not see an intact Achilles tendon shadow. Electronic Signature(s) Signed: 10/05/2015 2:43:26 PM By: Evlyn Kanner MD, FACS Previous Signature: 10/05/2015 2:32:23 PM Version By: Evlyn Kanner MD, FACS Entered By: Evlyn Kanner on 10/05/2015 14:43:26 [name] ([id number]) -------------------------------------------------------------------------------- Physical Exam Details Patient Name: [name] Date of  Service: 10/05/2015 2:15 PM Medical Record Number: [id number] Patient Account Number: [id number] Date of Birth/Sex: 01-08-1925 (82 y.o. Female) Treating RN: Curtis Sites Primary Care Physician: Rolm Gala Other Clinician: Referring Physician: Rolm Gala Treating Physician/Extender: Rudene Re in Treatment: 3 Constitutional . Pulse regular. Respirations normal and unlabored. Afebrile. . Eyes Nonicteric. Reactive to light. Ears, Nose, Mouth, and Throat Lips, teeth, and  gums WNL.Marland Kitchen Moist mucosa without lesions. Neck supple and nontender. No palpable supraclavicular or cervical adenopathy. Normal sized without goiter. Respiratory WNL. No retractions.. Cardiovascular Pedal Pulses WNL. No clubbing, cyanosis or edema. Lymphatic No adneopathy. No adenopathy. No adenopathy. Musculoskeletal Adexa without tenderness or enlargement.. Digits and nails w/o clubbing, cyanosis, infection, petechiae, ischemia, or inflammatory conditions.. Integumentary (Hair, Skin) No suspicious lesions. No crepitus or fluctuance. No peri-wound warmth or erythema. No masses.Marland Kitchen Psychiatric Judgement and insight Intact.. No evidence of depression, anxiety, or agitation.. Notes the wound continues to have a lot of subcutaneous debris which are sharply removed with a #3 curet and then also used a forceps and scissors. There is no probing down to bone yet. Electronic Signature(s) Signed: 10/05/2015 2:44:06 PM By: Evlyn Kanner MD, FACS Entered By: Evlyn Kanner on 10/05/2015 14:44:06 [name] ([id number]) -------------------------------------------------------------------------------- Physician Orders Details Patient Name: [name] Date of Service: 10/05/2015 2:15 PM Medical Record Number: [id number] Patient Account Number: [id number] Date of Birth/Sex: 23-Oct-1924 (82 y.o. Female) Treating RN: Afful, RN, BSN, [place]ink Primary Care Physician: Rolm Gala Other Clinician: Referring  Physician: Rolm Gala Treating Physician/Extender: Rudene Re in Treatment: 3 Verbal / Phone Orders: Yes Clinician: Afful, RN, BSN, Rita Read Back and Verified: Yes Diagnosis Coding Wound Cleansing Wound #1 Right Calcaneus o Cleanse wound with mild soap and water o May Shower, gently pat wound dry prior to applying new dressing. o May shower with protection. Anesthetic Wound #1 Right Calcaneus o Topical Lidocaine 4% cream applied to wound bed prior to debridement - In Clinic Primary Wound Dressing Wound #1 Right Calcaneus o Santyl Ointment - In clinic, can continue to use if able to afford it. o Medihoney gel - Use if not able to afford Santyl Secondary Dressing Wound #1 Right Calcaneus o Gauze and Kerlix/Conform Dressing Change Frequency Wound #1 Right Calcaneus o Change dressing every day. Follow-up Appointments Wound #1 Right Calcaneus o Return Appointment in 1 week. Off-Loading Wound #1 Right Calcaneus o Heel suspension boot to: - [employer] Additional Orders / Instructions Wound #1 Right Calcaneus o Increase protein intake. o Activity as tolerated Chavez, [name] ([id number]) o Other: - Include these vitamins in your diet, MVI, ZINC, Vit C, Vit A Home Health Wound #1 Right Calcaneus o Continue Home Health Visits - [facility] o Home Health Nurse may visit PRN to address patientos wound care needs. o FACE TO FACE ENCOUNTER: MEDICARE and MEDICAID PATIENTS: I certify that this patient is under my care and that I had a face-to-face encounter that meets the physician face-to-face encounter requirements with this patient on this date. The encounter with the patient was in whole or in part for the following MEDICAL CONDITION: (primary reason for Home Healthcare) MEDICAL NECESSITY: I certify, that based on my findings, NURSING services are a medically necessary home health service. HOME BOUND STATUS: I certify that my clinical  findings support that this patient is homebound (i.e., Due to illness or injury, pt requires aid of supportive devices such as crutches, cane, wheelchairs, walkers, the use of special transportation or the assistance of another person to leave their place of residence. There is a normal inability to leave the home and doing so requires considerable and taxing effort. Other absences are for medical reasons / religious services and are infrequent or of short duration when for other reasons). o If current dressing causes regression in wound condition, may D/C ordered dressing product/s and apply Normal Saline Moist Dressing daily until next Wound Healing Center /  Other MD appointment. Notify Wound Healing Center of regression in wound condition at [phone]. o Please direct any NON-WOUND related issues/requests for orders to patient's Primary Care Physician Electronic Signature(s) Signed: 10/05/2015 4:38:34 PM By: Evlyn Kanner MD, FACS Signed: 10/05/2015 5:32:33 PM By: Elpidio Eric BSN, RN Entered By: Elpidio Eric on 10/05/2015 14:39:12 [name] ([id number]) -------------------------------------------------------------------------------- Progress Note Details Patient Name: [name] Date of Service: 10/05/2015 2:15 PM Medical Record Number: [id number] Patient Account Number: [id number] Date of Birth/Sex: [birth year] (82 y.o. Female) Treating RN: Curtis Sites Primary Care Physician: Rolm Gala Other Clinician: Referring Physician: Rolm Gala Treating Physician/Extender: Rudene Re in Treatment: 3 Subjective History of Present Illness (HPI) The following HPI elements were documented for the patient's wound: Location: right heel Quality: Patient reports experiencing a dull pain to affected area(s). Severity: Patient states wound are getting worse. Duration: Patient has had the wound for < 8 weeks prior to presenting for treatment Timing: Pain in wound  is Intermittent (comes and goes Context: The wound occurred when the patient the hip fracture and was in traction for a few days while in hospital Modifying Factors: Other treatment(s) tried include:local care symptomatic treatment Associated Signs and Symptoms: Patient reports having increase discharge. 82 year old patient has been referred to as by her PCP for a pressure ulcer of the right heel. his had this for about 8 weeks Has been treated with duoderm. Has been there for a while it's getting more painful and larger and she also was seen for a recent urinary tract infection. She was given ciprofloxacin for this and then changed on to nitrofurantoin. She has a past medical history of rheumatic fever, hyperlipidemia, hypertension, scoliosis and osteopenia. never been a smoker. 10/05/2015 -- x-ray of the right heel -- IMPRESSION:No osseous finding of significance. Abnormal appearance of the soft tissues posterior to the heel which could be due to infection or trauma. I do not see an intact Achilles tendon shadow. Objective Constitutional Pulse regular. Respirations normal and unlabored. Afebrile. Vitals Time Taken: 2:17 PM, Height: 59 in, Weight: 108 lbs, BMI: 21.8, Temperature: 97.8 F, Pulse: 97 bpm, Respiratory Rate: 16 breaths/min, Blood Pressure: 119/51 mmHg. Eyes Nonicteric. Reactive to light. Ellsworth, [name] ([id number]) Ears, Nose, Mouth, and Throat Lips, teeth, and gums WNL.Marland Kitchen Moist mucosa without lesions. Neck supple and nontender. No palpable supraclavicular or cervical adenopathy. Normal sized without goiter. Respiratory WNL. No retractions.. Cardiovascular Pedal Pulses WNL. No clubbing, cyanosis or edema. Lymphatic No adneopathy. No adenopathy. No adenopathy. Musculoskeletal Adexa without tenderness or enlargement.. Digits and nails w/o clubbing, cyanosis, infection, petechiae, ischemia, or inflammatory conditions.Marland Kitchen Psychiatric Judgement and insight Intact.. No  evidence of depression, anxiety, or agitation.. General Notes: the wound continues to have a lot of subcutaneous debris which are sharply removed with a #3 curet and then also used a forceps and scissors. There is no probing down to bone yet. Integumentary (Hair, Skin) No suspicious lesions. No crepitus or fluctuance. No peri-wound warmth or erythema. No masses.. Wound #1 status is Open. Original cause of wound was Pressure Injury. The wound is located on the Right Calcaneus. The wound measures 2.5cm length x 2.6cm width x 0.2cm depth; 5.105cm^2 area and 1.021cm^3 volume. The wound is limited to skin breakdown. There is no tunneling or undermining noted. There is a large amount of purulent drainage noted. The wound margin is flat and intact. There is no granulation within the wound bed. There is a large (67-100%) amount of necrotic tissue within the wound bed including  Eschar and Adherent Slough. The periwound skin appearance exhibited: Moist, Erythema. The periwound skin appearance did not exhibit: Callus, Crepitus, Excoriation, Fluctuance, Friable, Induration, Localized Edema, Rash, Scarring, Dry/Scaly, Maceration, Atrophie Blanche, Cyanosis, Ecchymosis, Hemosiderin Staining, Mottled, Pallor, Rubor. The surrounding wound skin color is noted with erythema which is circumferential. Periwound temperature was noted as No Abnormality. The periwound has tenderness on palpation. Procedures Wound #1 Wound #1 is a Pressure Ulcer located on the Right Calcaneus . There was a Skin/Subcutaneous Tissue Chavez, [name] ([id number]) Debridement ([id number]) debridement with total area of 6.5 sq cm performed by Evlyn Kanner, MD. with the following instrument(s): Curette, Forceps, and Scissors to remove Non-Viable tissue/material including Exudate, Fibrin/Slough, and Subcutaneous after achieving pain control using Lidocaine 4% Topical Solution. A time out was conducted at 14:33, prior to the start of the  procedure. A Minimum amount of bleeding was controlled with Pressure. The procedure was tolerated well with a pain level of 0 throughout and a pain level of 0 following the procedure. Post Debridement Measurements: 2.5cm length x 2.6cm width x 0.3cm depth; 1.532cm^3 volume. Post debridement Stage noted as Category/Stage III. Character of Wound/Ulcer Post Debridement requires further debridement. Severity of Tissue Post Debridement is: Limited to breakdown of skin. Post procedure Diagnosis Wound #1: Same as Pre-Procedure Plan Wound Cleansing: Wound #1 Right Calcaneus: Cleanse wound with mild soap and water May Shower, gently pat wound dry prior to applying new dressing. May shower with protection. Anesthetic: Wound #1 Right Calcaneus: Topical Lidocaine 4% cream applied to wound bed prior to debridement - In Clinic Primary Wound Dressing: Wound #1 Right Calcaneus: Santyl Ointment - In clinic, can continue to use if able to afford it. Medihoney gel - Use if not able to afford Santyl Secondary Dressing: Wound #1 Right Calcaneus: Gauze and Kerlix/Conform Dressing Change Frequency: Wound #1 Right Calcaneus: Change dressing every day. Follow-up Appointments: Wound #1 Right Calcaneus: Return Appointment in 1 week. Off-Loading: Wound #1 Right Calcaneus: Heel suspension boot to: - [employer] Additional Orders / Instructions: Wound #1 Right Calcaneus: Increase protein intake. Activity as tolerated Other: - Include these vitamins in your diet, MVI, ZINC, Vit C, Vit A Home Health: Wound #1 Right Calcaneus: Continue Home Health Visits - [name], [name] ([id number]) Home Health Nurse may visit PRN to address patient s wound care needs. FACE TO FACE ENCOUNTER: MEDICARE and MEDICAID PATIENTS: I certify that this patient is under my care and that I had a face-to-face encounter that meets the physician face-to-face encounter requirements with this patient on this date. The  encounter with the patient was in whole or in part for the following MEDICAL CONDITION: (primary reason for Home Healthcare) MEDICAL NECESSITY: I certify, that based on my findings, NURSING services are a medically necessary home health service. HOME BOUND STATUS: I certify that my clinical findings support that this patient is homebound (i.e., Due to illness or injury, pt requires aid of supportive devices such as crutches, cane, wheelchairs, walkers, the use of special transportation or the assistance of another person to leave their place of residence. There is a normal inability to leave the home and doing so requires considerable and taxing effort. Other absences are for medical reasons / religious services and are infrequent or of short duration when for other reasons). If current dressing causes regression in wound condition, may D/C ordered dressing product/s and apply Normal Saline Moist Dressing daily until next Wound Healing Center / Other MD appointment. Notify Wound Healing Center of regression in  wound condition at [phone]. Please direct any NON-WOUND related issues/requests for orders to patient's Primary Care Physician I have recommended: 1. Santyl ointment locally to be applied daily. If this is not affordable she should use Medihoney. 2. Offloading has been discussed in great detail 3. Adequate protein, vitamin A, vitamin C and zinc 4. x-ray of the right heel -- report discussed with the patient and her family 5. regular visits to the wound center Electronic Signature(s) Signed: 10/05/2015 2:45:00 PM By: Evlyn KannerBritto, Tahir Blank MD, FACS Entered By: Evlyn KannerBritto, Adie Vilar on 10/05/2015 14:45:00 [name], [name] ([id number]) -------------------------------------------------------------------------------- SuperBill Details Patient Name: [name], [name] Date of Service: 10/05/2015 Medical Record Number: [id number] Patient Account Number: [id number] Date of Birth/Sex: 02/26/1924 (82 y.o.  Female) Treating RN: Curtis Sitesorthy, Joanna Primary Care Physician: Rolm GalaGrandis, Heidi Other Clinician: Referring Physician: Rolm GalaGrandis, Heidi Treating Physician/Extender: Rudene ReBritto, Miranda Frese Weeks in Treatment: 3 Diagnosis Coding ICD-10 Codes Code Description [phone] [id number] Pressure ulcer of right heel, stage 3 E44.1 Mild protein-calorie malnutrition Facility Procedures CPT4 Code: [id number] Description: 11042 - DEB SUBQ TISSUE 20 SQ CM/< ICD-10 Description Diagnosis L89.613 Pressure ulcer of right heel, stage 3 E44.1 Mild protein-calorie malnutrition Modifier: Quantity: 1 Physician Procedures CPT4 Code: [id number] Description: 99213 - WC PHYS LEVEL 3 - EST PT ICD-10 Description Diagnosis L89.613 Pressure ulcer of right heel, stage 3 E44.1 Mild protein-calorie malnutrition Modifier: Quantity: 1 CPT4 Code: [id number] Description: 11042 - WC PHYS SUBQ TISS 20 SQ CM ICD-10 Description Diagnosis L89.613 Pressure ulcer of right heel, stage 3 E44.1 Mild protein-calorie malnutrition Modifier: Quantity: 1 Electronic Signature(s) Signed: 10/05/2015 2:45:21 PM By: Evlyn KannerBritto, Terrick Allred MD, FACS Entered By: Evlyn KannerBritto, Shantaya Bluestone on 10/05/2015 14:45:20

## 2015-10-06 NOTE — Progress Notes (Signed)
BARBIE, CROSTON ([id number]) Visit Report for 10/05/2015 Arrival Information Details Patient Name: [name], [name] Date of Service: 10/05/2015 2:15 PM Medical Record Number: [id number] Patient Account Number: [id number] Date of Birth/Sex: [birth year] (82 y.o. Female) Treating RN: Curtis Sites Primary Care Physician: Rolm Gala Other Clinician: Referring Physician: Rolm Gala Treating Physician/Extender: Rudene Re in Treatment: 3 Visit Information History Since Last Visit Added or deleted any medications: No Patient Arrived: Wheel Chair Any new allergies or adverse reactions: No Arrival Time: 14:11 Had a fall or experienced change in No Accompanied By: sister activities of daily living that may affect Transfer Assistance: Manual risk of falls: Patient Identification Verified: Yes Signs or symptoms of abuse/neglect since last No Secondary Verification Process Yes visito Completed: Hospitalized since last visit: No Patient Requires Transmission- No Pain Present Now: No Based Precautions: Patient Has Alerts: Yes Patient Alerts: Patient on Blood Thinner eliquis Electronic Signature(s) Signed: 10/05/2015 4:52:33 PM By: Curtis Sites Entered By: Curtis Sites on 10/05/2015 14:17:42 Worthey, Theora Gianotti ([id number]) -------------------------------------------------------------------------------- Encounter Discharge Information Details Patient Name: [name] Date of Service: 10/05/2015 2:15 PM Medical Record Number: [id number] Patient Account Number: [id number] Date of Birth/Sex: 24-May-1924 (82 y.o. Female) Treating RN: Curtis Sites Primary Care Physician: Rolm Gala Other Clinician: Referring Physician: Rolm Gala Treating Physician/Extender: Rudene Re in Treatment: 3 Encounter Discharge Information Items Discharge Pain Level: 0 Discharge Condition: Stable Ambulatory Status: Wheelchair Discharge Destination: Home Transportation:  Private Auto Accompanied By: dtr Schedule Follow-up Appointment: Yes Medication Reconciliation completed and provided to Patient/Care No Raizel Wesolowski: Provided on Clinical Summary of Care: 10/05/2015 Form Type Recipient Paper Patient DI Electronic Signature(s) Signed: 10/05/2015 2:50:18 PM By: Gwenlyn Perking Previous Signature: 10/05/2015 2:29:39 PM Version By: Curtis Sites Entered By: Gwenlyn Perking on 10/05/2015 14:50:18 [name] ([id number]) -------------------------------------------------------------------------------- Lower Extremity Assessment Details Patient Name: [name] Date of Service: 10/05/2015 2:15 PM Medical Record Number: [id number] Patient Account Number: [id number] Date of Birth/Sex: 1924-08-24 (82 y.o. Female) Treating RN: Curtis Sites Primary Care Physician: Rolm Gala Other Clinician: Referring Physician: Rolm Gala Treating Physician/Extender: Rudene Re in Treatment: 3 Vascular Assessment Pulses: Posterior Tibial Dorsalis Pedis Palpable: [Right:Yes] Extremity colors, hair growth, and conditions: Extremity Color: [Right:Normal] Hair Growth on Extremity: [Right:Yes] Temperature of Extremity: [Right:Warm] Capillary Refill: [Right:< 3 seconds] Electronic Signature(s) Signed: 10/05/2015 4:52:33 PM By: Curtis Sites Entered By: Curtis Sites on 10/05/2015 14:25:25 Paulick, Theora Gianotti ([id number]) -------------------------------------------------------------------------------- Multi Wound Chart Details Patient Name: [name] Date of Service: 10/05/2015 2:15 PM Medical Record Number: [id number] Patient Account Number: [id number] Date of Birth/Sex: 07/23/1924 (82 y.o. Female) Treating RN: Clover Mealy, RN, BSN, [place] Sink Primary Care Physician: Rolm Gala Other Clinician: Referring Physician: Rolm Gala Treating Physician/Extender: Rudene Re in Treatment: 3 Vital Signs Height(in): 59 Pulse(bpm): 97 Weight(lbs):  108 Blood Pressure 119/51 (mmHg): Body Mass Index(BMI): 22 Temperature(F): 97.8 Respiratory Rate 16 (breaths/min): Photos: [N/A:N/A] Wound Location: Right Calcaneus N/A N/A Wounding Event: Pressure Injury N/A N/A Primary Etiology: Pressure Ulcer N/A N/A Comorbid History: Deep Vein Thrombosis, N/A N/A Hypertension, Dementia Date Acquired: 07/14/2015 N/A N/A Weeks of Treatment: 3 N/A N/A Wound Status: Open N/A N/A Measurements L x W x D 2.5x2.6x0.2 N/A N/A (cm) Area (cm) : 5.105 N/A N/A Volume (cm) : 1.021 N/A N/A % Reduction in Area: 7.40% N/A N/A % Reduction in Volume: 7.40% N/A N/A Classification: Category/Stage III N/A N/A Exudate Amount: Large N/A N/A Exudate Type: Purulent N/A N/A Exudate Color: yellow, brown, green N/A N/A Wound Margin: Flat and Intact  N/A N/A Granulation Amount: None Present (0%) N/A N/A Necrotic Amount: Large (67-100%) N/A N/A Necrotic Tissue: Eschar, Adherent Slough N/A N/A Schabel, Yeily L. ([id number]) Exposed Structures: Fascia: No N/A N/A Fat: No Tendon: No Muscle: No Joint: No Bone: No Limited to Skin Breakdown Epithelialization: None N/A N/A Periwound Skin Texture: Edema: No N/A N/A Excoriation: No Induration: No Callus: No Crepitus: No Fluctuance: No Friable: No Rash: No Scarring: No Periwound Skin Moist: Yes N/A N/A Moisture: Maceration: No Dry/Scaly: No Periwound Skin Color: Erythema: Yes N/A N/A Atrophie Blanche: No Cyanosis: No Ecchymosis: No Hemosiderin Staining: No Mottled: No Pallor: No Rubor: No Erythema Location: Circumferential N/A N/A Temperature: No Abnormality N/A N/A Tenderness on Yes N/A N/A Palpation: Wound Preparation: Ulcer Cleansing: N/A N/A Rinsed/Irrigated with Saline Topical Anesthetic Applied: Other: lidocaine 4% Treatment Notes Electronic Signature(s) Signed: 10/05/2015 5:32:33 PM By: Elpidio Eric BSN, RN Entered By: Elpidio Eric on 10/05/2015 14:34:08 [name]  ([id number]) -------------------------------------------------------------------------------- Multi-Disciplinary Care Plan Details Patient Name: [name] Date of Service: 10/05/2015 2:15 PM Medical Record Number: [id number] Patient Account Number: [id number] Date of Birth/Sex: 1924/08/21 (82 y.o. Female) Treating RN: Afful, RN, BSN, [place] Sink Primary Care Physician: Rolm Gala Other Clinician: Referring Physician: Rolm Gala Treating Physician/Extender: Rudene Re in Treatment: 3 Active Inactive Abuse / Safety / Falls / Self Care Management Nursing Diagnoses: Impaired home maintenance Impaired physical mobility Potential for falls Self care deficit: actual or potential Goals: Patient will remain injury free Date Initiated: 09/14/2015 Goal Status: Active Patient/caregiver will verbalize understanding of skin care regimen Date Initiated: 09/14/2015 Goal Status: Active Patient/caregiver will verbalize/demonstrate measure taken to improve self care Date Initiated: 09/14/2015 Goal Status: Active Patient/caregiver will verbalize/demonstrate measures taken to improve the patient's personal safety Date Initiated: 09/14/2015 Goal Status: Active Patient/caregiver will verbalize/demonstrate measures taken to prevent injury and/or falls Date Initiated: 09/14/2015 Goal Status: Active Patient/caregiver will verbalize/demonstrate understanding of what to do in case of emergency Date Initiated: 09/14/2015 Goal Status: Active Interventions: Assess fall risk on admission and as needed Assess: immobility, friction, shearing, incontinence upon admission and as needed Assess impairment of mobility on admission and as needed per policy Assess self care needs on admission and as needed Provide education on basic hygiene Provide education on personal and home safety MALEIA, WEEMS ([id number]) Provide education on safe transfers Notes: Orientation to the Wound Care  Program Nursing Diagnoses: Knowledge deficit related to the wound healing center program Goals: Patient/caregiver will verbalize understanding of the Wound Healing Center Program Date Initiated: 09/14/2015 Goal Status: Active Interventions: Provide education on orientation to the wound center Notes: Pressure Nursing Diagnoses: Knowledge deficit related to causes and risk factors for pressure ulcer development Knowledge deficit related to management of pressures ulcers Potential for impaired tissue integrity related to pressure, friction, moisture, and shear Goals: Patient will remain free from development of additional pressure ulcers Date Initiated: 09/14/2015 Goal Status: Active Patient will remain free of pressure ulcers Date Initiated: 09/14/2015 Goal Status: Active Patient/caregiver will verbalize risk factors for pressure ulcer development Date Initiated: 09/14/2015 Goal Status: Active Patient/caregiver will verbalize understanding of pressure ulcer management Date Initiated: 09/14/2015 Goal Status: Active Interventions: Assess: immobility, friction, shearing, incontinence upon admission and as needed Assess offloading mechanisms upon admission and as needed Assess potential for pressure ulcer upon admission and as needed Provide education on pressure ulcers Treatment Activities: NECIE, WILCOXSON ([id number]) Patient referred for pressure reduction/relief devices : 09/14/2015 Patient referred for seating evaluation to ensure proper offloading : 09/14/2015 Pressure reduction/relief  device ordered : 09/14/2015 Notes: Wound/Skin Impairment Nursing Diagnoses: Impaired tissue integrity Knowledge deficit related to ulceration/compromised skin integrity Goals: Patient/caregiver will verbalize understanding of skin care regimen Date Initiated: 09/14/2015 Goal Status: Active Ulcer/skin breakdown will have a volume reduction of 30% by week 4 Date Initiated: 09/14/2015 Goal Status:  Active Ulcer/skin breakdown will have a volume reduction of 50% by week 8 Date Initiated: 09/14/2015 Goal Status: Active Ulcer/skin breakdown will have a volume reduction of 80% by week 12 Date Initiated: 09/14/2015 Goal Status: Active Ulcer/skin breakdown will heal within 14 weeks Date Initiated: 09/14/2015 Goal Status: Active Interventions: Assess patient/caregiver ability to obtain necessary supplies Assess patient/caregiver ability to perform ulcer/skin care regimen upon admission and as needed Assess ulceration(s) every visit Provide education on ulcer and skin care Treatment Activities: Referred to DME Ionna Avis for dressing supplies : 09/14/2015 Skin care regimen initiated : 09/14/2015 Topical wound management initiated : 09/14/2015 Notes: Electronic Signature(s) Signed: 10/05/2015 5:32:33 PM By: Elpidio EricAfful, Rita BSN, RN Entered By: Elpidio EricAfful, Rita on 10/05/2015 14:33:54 Colleen QualeISLEY, Cariana L. ([id number] Mallick) Cloward, [name] ([id number]) -------------------------------------------------------------------------------- Pain Assessment Details Patient Name: Colleen QualeISLEY, Jahzaria L. Date of Service: 10/05/2015 2:15 PM Medical Record Number: [id number] Patient Account Number: [id number] Date of Birth/Sex: 12/06/1924 (82 y.o. Female) Treating RN: Curtis Sitesorthy, Joanna Primary Care Physician: Rolm GalaGrandis, Heidi Other Clinician: Referring Physician: Rolm GalaGrandis, Heidi Treating Physician/Extender: Rudene ReBritto, Errol Weeks in Treatment: 3 Active Problems Location of Pain Severity and Description of Pain Patient Has Paino No Site Locations Pain Management and Medication Current Pain Management: Notes Topical or injectable lidocaine is offered to patient for acute pain when surgical debridement is performed. If needed, Patient is instructed to use over the counter pain medication for the following 24-48 hours after debridement. Wound care MDs do not prescribed pain medications. Patient has chronic pain or uncontrolled pain.  Patient has been instructed to make an appointment with their Primary Care Physician for pain management. Electronic Signature(s) Signed: 10/05/2015 4:52:33 PM By: Curtis Sitesorthy, Joanna Entered By: Curtis Sitesorthy, Joanna on 10/05/2015 14:17:49 Colleen QualeISLEY, Ivyanna L. ([id number]) -------------------------------------------------------------------------------- Patient/Caregiver Education Details Patient Name: Colleen QualeISLEY, Makaiyah L. Date of Service: 10/05/2015 2:15 PM Medical Record Number: [id number] Patient Account Number: [id number] Date of Birth/Gender: 07/14/1924 (82 y.o. Female) Treating RN: Curtis Sitesorthy, Joanna Primary Care Physician: Rolm GalaGrandis, Heidi Other Clinician: Referring Physician: Rolm GalaGrandis, Heidi Treating Physician/Extender: Rudene ReBritto, Errol Weeks in Treatment: 3 Education Assessment Education Provided To: Caregiver Education Topics Provided Wound/Skin Impairment: Handouts: Other: wound care as ordered and offloading Methods: Demonstration, Explain/Verbal Responses: State content correctly Electronic Signature(s) Signed: 10/05/2015 4:52:33 PM By: Curtis Sitesorthy, Joanna Entered By: Curtis Sitesorthy, Joanna on 10/05/2015 14:30:33 Compere, [name] ([id number]) -------------------------------------------------------------------------------- Wound Assessment Details Patient Name: Colleen QualeISLEY, Ermal L. Date of Service: 10/05/2015 2:15 PM Medical Record Number: [id number] Patient Account Number: [id number] Date of Birth/Sex: 02/26/1924 (82 y.o. Female) Treating RN: Curtis Sitesorthy, Joanna Primary Care Physician: Rolm GalaGrandis, Heidi Other Clinician: Referring Physician: Rolm GalaGrandis, Heidi Treating Physician/Extender: Rudene ReBritto, Errol Weeks in Treatment: 3 Wound Status Wound Number: 1 Primary Pressure Ulcer Etiology: Wound Location: Right Calcaneus Wound Status: Open Wounding Event: Pressure Injury Comorbid Deep Vein Thrombosis, Date Acquired: 07/14/2015 History: Hypertension, Dementia Weeks Of Treatment: 3 Clustered Wound: No Photos Wound  Measurements Length: (cm) 2.5 Width: (cm) 2.6 Depth: (cm) 0.2 Area: (cm) 5.105 Volume: (cm) 1.021 % Reduction in Area: 7.4% % Reduction in Volume: 7.4% Epithelialization: None Tunneling: No Undermining: No Wound Description Classification: Category/Stage III Wound Margin: Flat and Intact Exudate Amount: Large Exudate Type: Purulent Exudate Color: yellow, brown, green Foul Odor After Cleansing: No Wound Bed  Granulation Amount: None Present (0%) Exposed Structure Necrotic Amount: Large (67-100%) Fascia Exposed: No Necrotic Quality: Eschar, Adherent Slough Fat Layer Exposed: No Tendon Exposed: No Muscle Exposed: No Suriano, Delaina L. ([id number]) Joint Exposed: No Bone Exposed: No Limited to Skin Breakdown Periwound Skin Texture Texture Color No Abnormalities Noted: No No Abnormalities Noted: No Callus: No Atrophie Blanche: No Crepitus: No Cyanosis: No Excoriation: No Ecchymosis: No Fluctuance: No Erythema: Yes Friable: No Erythema Location: Circumferential Induration: No Hemosiderin Staining: No Localized Edema: No Mottled: No Rash: No Pallor: No Scarring: No Rubor: No Moisture Temperature / Pain No Abnormalities Noted: No Temperature: No Abnormality Dry / Scaly: No Tenderness on Palpation: Yes Maceration: No Moist: Yes Wound Preparation Ulcer Cleansing: Rinsed/Irrigated with Saline Topical Anesthetic Applied: Other: lidocaine 4%, Treatment Notes Wound #1 (Right Calcaneus) 1. Cleansed with: Clean wound with Normal Saline 2. Anesthetic Topical Lidocaine 4% cream to wound bed prior to debridement 4. Dressing Applied: Santyl Ointment 5. Secondary Dressing Applied Gauze and Kerlix/Conform 7. Secured with Tape Notes netting Electronic Signature(s) Signed: 10/05/2015 4:52:33 PM By: Curtis Sites Entered By: Curtis Sites on 10/05/2015 14:25:05 [name]  ([id number]) -------------------------------------------------------------------------------- Vitals Details Patient Name: [name] Date of Service: 10/05/2015 2:15 PM Medical Record Number: [id number] Patient Account Number: [id number] Date of Birth/Sex: 10/31/1924 (82 y.o. Female) Treating RN: Curtis Sites Primary Care Physician: Rolm Gala Other Clinician: Referring Physician: Rolm Gala Treating Physician/Extender: Rudene Re in Treatment: 3 Vital Signs Time Taken: 14:17 Temperature (F): 97.8 Height (in): 59 Pulse (bpm): 97 Weight (lbs): 108 Respiratory Rate (breaths/min): 16 Body Mass Index (BMI): 21.8 Blood Pressure (mmHg): 119/51 Reference Range: 80 - 120 mg / dl Electronic Signature(s) Signed: 10/05/2015 4:52:33 PM By: Curtis Sites Entered By: Curtis Sites on 10/05/2015 14:18:09

## 2015-10-12 ENCOUNTER — Encounter: Payer: Medicare Other | Attending: Surgery | Admitting: Surgery

## 2015-10-12 DIAGNOSIS — M8580 Other specified disorders of bone density and structure, unspecified site: Secondary | ICD-10-CM | POA: Diagnosis not present

## 2015-10-12 DIAGNOSIS — I10 Essential (primary) hypertension: Secondary | ICD-10-CM | POA: Diagnosis not present

## 2015-10-12 DIAGNOSIS — L89613 Pressure ulcer of right heel, stage 3: Secondary | ICD-10-CM | POA: Diagnosis not present

## 2015-10-12 DIAGNOSIS — Z86718 Personal history of other venous thrombosis and embolism: Secondary | ICD-10-CM | POA: Diagnosis not present

## 2015-10-12 DIAGNOSIS — Z880 Allergy status to penicillin: Secondary | ICD-10-CM | POA: Diagnosis not present

## 2015-10-12 DIAGNOSIS — Z7982 Long term (current) use of aspirin: Secondary | ICD-10-CM | POA: Diagnosis not present

## 2015-10-12 DIAGNOSIS — E785 Hyperlipidemia, unspecified: Secondary | ICD-10-CM | POA: Diagnosis not present

## 2015-10-12 DIAGNOSIS — E441 Mild protein-calorie malnutrition: Secondary | ICD-10-CM | POA: Diagnosis not present

## 2015-10-12 DIAGNOSIS — Z79899 Other long term (current) drug therapy: Secondary | ICD-10-CM | POA: Insufficient documentation

## 2015-10-12 DIAGNOSIS — F0390 Unspecified dementia without behavioral disturbance: Secondary | ICD-10-CM | POA: Insufficient documentation

## 2015-10-12 DIAGNOSIS — M419 Scoliosis, unspecified: Secondary | ICD-10-CM | POA: Diagnosis not present

## 2015-10-12 DIAGNOSIS — Z7901 Long term (current) use of anticoagulants: Secondary | ICD-10-CM | POA: Diagnosis not present

## 2015-10-13 NOTE — Progress Notes (Signed)
SABRIN, DUNLEVY ([id number]) Visit Report for 10/12/2015 Arrival Information Details Patient Name: [name], [name] Date of Service: 10/12/2015 2:15 PM Medical Record Number: [id number] Patient Account Number: [id number] Date of Birth/Sex: December 02, 1924 (82 y.o. Female) Treating RN: Curtis Sites Primary Care Physician: Rolm Gala Other Clinician: Referring Physician: Rolm Gala Treating Physician/Extender: Rudene Re in Treatment: 4 Visit Information History Since Last Visit Added or deleted any medications: No Patient Arrived: Wheel Chair Any new allergies or adverse reactions: No Arrival Time: 14:08 Had a fall or experienced change in No Accompanied By: dtr activities of daily living that may affect Transfer Assistance: Manual risk of falls: Patient Identification Verified: Yes Signs or symptoms of abuse/neglect since last No Secondary Verification Process Yes visito Completed: Hospitalized since last visit: No Patient Requires Transmission- No Pain Present Now: No Based Precautions: Patient Has Alerts: Yes Patient Alerts: Patient on Blood Thinner eliquis Electronic Signature(s) Signed: 10/12/2015 5:53:50 PM By: Curtis Sites Entered By: Curtis Sites on 10/12/2015 14:12:00 [name] ([id number]) -------------------------------------------------------------------------------- Encounter Discharge Information Details Patient Name: [name] Date of Service: 10/12/2015 2:15 PM Medical Record Number: [id number] Patient Account Number: [id number] Date of Birth/Sex: 21-Mar-1924 (82 y.o. Female) Treating RN: Phillis Haggis Primary Care Physician: Rolm Gala Other Clinician: Referring Physician: Rolm Gala Treating Physician/Extender: Rudene Re in Treatment: 4 Encounter Discharge Information Items Discharge Pain Level: 0 Discharge Condition: Stable Ambulatory Status: Wheelchair Discharge Destination: Home Transportation:  Other Accompanied By: daughter Schedule Follow-up Appointment: Yes Medication Reconciliation completed Yes and provided to Patient/Care Julieanna Geraci: Provided on Clinical Summary of Care: 10/12/2015 Form Type Recipient Paper Patient DI Electronic Signature(s) Signed: 10/12/2015 2:37:12 PM By: Gwenlyn Perking Entered By: Gwenlyn Perking on 10/12/2015 14:37:12 [name] ([id number]) -------------------------------------------------------------------------------- Lower Extremity Assessment Details Patient Name: [name] Date of Service: 10/12/2015 2:15 PM Medical Record Number: [id number] Patient Account Number: [id number] Date of Birth/Sex: Jan 01, 1925 (82 y.o. Female) Treating RN: Curtis Sites Primary Care Physician: Rolm Gala Other Clinician: Referring Physician: Rolm Gala Treating Physician/Extender: Rudene Re in Treatment: 4 Vascular Assessment Pulses: Posterior Tibial Dorsalis Pedis Palpable: [Right:Yes] Extremity colors, hair growth, and conditions: Extremity Color: [Right:Normal] Temperature of Extremity: [Right:Warm] Capillary Refill: [Right:< 3 seconds] Electronic Signature(s) Signed: 10/12/2015 5:53:50 PM By: Curtis Sites Entered By: Curtis Sites on 10/12/2015 14:14:55 Piccione, Purvi Elbert Ewings ([id number]) -------------------------------------------------------------------------------- Multi Wound Chart Details Patient Name: [name] Date of Service: 10/12/2015 2:15 PM Medical Record Number: [id number] Patient Account Number: [id number] Date of Birth/Sex: 02-01-1925 (82 y.o. Female) Treating RN: Clover Mealy, RN, BSN, [place] Sink Primary Care Physician: Rolm Gala Other Clinician: Referring Physician: Rolm Gala Treating Physician/Extender: Rudene Re in Treatment: 4 Vital Signs Height(in): 59 Pulse(bpm): 85 Weight(lbs): 108 Blood Pressure 138/59 (mmHg): Body Mass Index(BMI): 22 Temperature(F): 97.5 Respiratory  Rate 16 (breaths/min): Photos: [N/A:N/A] Wound Location: Right Calcaneus N/A N/A Wounding Event: Pressure Injury N/A N/A Primary Etiology: Pressure Ulcer N/A N/A Comorbid History: Deep Vein Thrombosis, N/A N/A Hypertension, Dementia Date Acquired: 07/14/2015 N/A N/A Weeks of Treatment: 4 N/A N/A Wound Status: Open N/A N/A Measurements L x W x D 2.3x2.5x0.3 N/A N/A (cm) Area (cm) : 4.516 N/A N/A Volume (cm) : 1.355 N/A N/A % Reduction in Area: 18.10% N/A N/A % Reduction in Volume: -22.80% N/A N/A Classification: Category/Stage III N/A N/A Exudate Amount: Large N/A N/A Exudate Type: Purulent N/A N/A Exudate Color: yellow, brown, green N/A N/A Wound Margin: Flat and Intact N/A N/A Granulation Amount: Medium (34-66%) N/A N/A Granulation Quality: Red N/A N/A Necrotic Amount:  Medium (34-66%) N/A N/A Carlyon, Samon L. ([id number]) Necrotic Tissue: Eschar, Adherent Slough N/A N/A Exposed Structures: Fascia: No N/A N/A Fat: No Tendon: No Muscle: No Joint: No Bone: No Limited to Skin Breakdown Epithelialization: None N/A N/A Periwound Skin Texture: Edema: No N/A N/A Excoriation: No Induration: No Callus: No Crepitus: No Fluctuance: No Friable: No Rash: No Scarring: No Periwound Skin Moist: Yes N/A N/A Moisture: Maceration: No Dry/Scaly: No Periwound Skin Color: Erythema: Yes N/A N/A Atrophie Blanche: No Cyanosis: No Ecchymosis: No Hemosiderin Staining: No Mottled: No Pallor: No Rubor: No Erythema Location: Circumferential N/A N/A Temperature: No Abnormality N/A N/A Tenderness on Yes N/A N/A Palpation: Wound Preparation: Ulcer Cleansing: N/A N/A Rinsed/Irrigated with Saline Topical Anesthetic Applied: Other: lidocaine 4% Treatment Notes Electronic Signature(s) Signed: 10/12/2015 5:31:48 PM By: Elpidio Eric BSN, RN Entered By: Elpidio Eric on 10/12/2015 14:23:16 [name]  ([id number]) -------------------------------------------------------------------------------- Multi-Disciplinary Care Plan Details Patient Name: [name] Date of Service: 10/12/2015 2:15 PM Medical Record Number: [id number] Patient Account Number: [id number] Date of Birth/Sex: 03/25/1924 (82 y.o. Female) Treating RN: Afful, RN, BSN, [place] Sink Primary Care Physician: Rolm Gala Other Clinician: Referring Physician: Rolm Gala Treating Physician/Extender: Rudene Re in Treatment: 4 Active Inactive Abuse / Safety / Falls / Self Care Management Nursing Diagnoses: Impaired home maintenance Impaired physical mobility Potential for falls Self care deficit: actual or potential Goals: Patient will remain injury free Date Initiated: 09/14/2015 Goal Status: Active Patient/caregiver will verbalize understanding of skin care regimen Date Initiated: 09/14/2015 Goal Status: Active Patient/caregiver will verbalize/demonstrate measure taken to improve self care Date Initiated: 09/14/2015 Goal Status: Active Patient/caregiver will verbalize/demonstrate measures taken to improve the patient's personal safety Date Initiated: 09/14/2015 Goal Status: Active Patient/caregiver will verbalize/demonstrate measures taken to prevent injury and/or falls Date Initiated: 09/14/2015 Goal Status: Active Patient/caregiver will verbalize/demonstrate understanding of what to do in case of emergency Date Initiated: 09/14/2015 Goal Status: Active Interventions: Assess fall risk on admission and as needed Assess: immobility, friction, shearing, incontinence upon admission and as needed Assess impairment of mobility on admission and as needed per policy Assess self care needs on admission and as needed Provide education on basic hygiene Provide education on personal and home safety LATESHA, CHESNEY ([id number]) Provide education on safe transfers Notes: Orientation to the Wound Care  Program Nursing Diagnoses: Knowledge deficit related to the wound healing center program Goals: Patient/caregiver will verbalize understanding of the Wound Healing Center Program Date Initiated: 09/14/2015 Goal Status: Active Interventions: Provide education on orientation to the wound center Notes: Pressure Nursing Diagnoses: Knowledge deficit related to causes and risk factors for pressure ulcer development Knowledge deficit related to management of pressures ulcers Potential for impaired tissue integrity related to pressure, friction, moisture, and shear Goals: Patient will remain free from development of additional pressure ulcers Date Initiated: 09/14/2015 Goal Status: Active Patient will remain free of pressure ulcers Date Initiated: 09/14/2015 Goal Status: Active Patient/caregiver will verbalize risk factors for pressure ulcer development Date Initiated: 09/14/2015 Goal Status: Active Patient/caregiver will verbalize understanding of pressure ulcer management Date Initiated: 09/14/2015 Goal Status: Active Interventions: Assess: immobility, friction, shearing, incontinence upon admission and as needed Assess offloading mechanisms upon admission and as needed Assess potential for pressure ulcer upon admission and as needed Provide education on pressure ulcers Treatment Activities: JERMIYA, REICHL ([id number]) Patient referred for pressure reduction/relief devices : 09/14/2015 Patient referred for seating evaluation to ensure proper offloading : 09/14/2015 Pressure reduction/relief device ordered : 09/14/2015 Notes: Wound/Skin Impairment Nursing Diagnoses: Impaired tissue  integrity Knowledge deficit related to ulceration/compromised skin integrity Goals: Patient/caregiver will verbalize understanding of skin care regimen Date Initiated: 09/14/2015 Goal Status: Active Ulcer/skin breakdown will have a volume reduction of 30% by week 4 Date Initiated: 09/14/2015 Goal Status:  Active Ulcer/skin breakdown will have a volume reduction of 50% by week 8 Date Initiated: 09/14/2015 Goal Status: Active Ulcer/skin breakdown will have a volume reduction of 80% by week 12 Date Initiated: 09/14/2015 Goal Status: Active Ulcer/skin breakdown will heal within 14 weeks Date Initiated: 09/14/2015 Goal Status: Active Interventions: Assess patient/caregiver ability to obtain necessary supplies Assess patient/caregiver ability to perform ulcer/skin care regimen upon admission and as needed Assess ulceration(s) every visit Provide education on ulcer and skin care Treatment Activities: Referred to DME Derrico Zhong for dressing supplies : 09/14/2015 Skin care regimen initiated : 09/14/2015 Topical wound management initiated : 09/14/2015 Notes: Electronic Signature(s) Signed: 10/12/2015 5:31:48 PM By: Elpidio EricAfful, Rita BSN, RN Entered By: Elpidio EricAfful, Rita on 10/12/2015 14:23:05 Colleen QualeISLEY, Raneshia L. ([id number] Mallick) Edick, [name] ([id number]) -------------------------------------------------------------------------------- Pain Assessment Details Patient Name: Colleen QualeISLEY, Tamarra L. Date of Service: 10/12/2015 2:15 PM Medical Record Number: [id number] Patient Account Number: [id number] Date of Birth/Sex: 09/11/1924 (82 y.o. Female) Treating RN: Curtis Sitesorthy, Joanna Primary Care Physician: Rolm GalaGrandis, Heidi Other Clinician: Referring Physician: Rolm GalaGrandis, Heidi Treating Physician/Extender: Rudene ReBritto, Errol Weeks in Treatment: 4 Active Problems Location of Pain Severity and Description of Pain Patient Has Paino No Site Locations Pain Management and Medication Current Pain Management: Notes Topical or injectable lidocaine is offered to patient for acute pain when surgical debridement is performed. If needed, Patient is instructed to use over the counter pain medication for the following 24-48 hours after debridement. Wound care MDs do not prescribed pain medications. Patient has chronic pain or uncontrolled pain. Patient  has been instructed to make an appointment with their Primary Care Physician for pain management. Electronic Signature(s) Signed: 10/12/2015 5:53:50 PM By: Curtis Sitesorthy, Joanna Entered By: Curtis Sitesorthy, Joanna on 10/12/2015 14:12:07 Colleen QualeISLEY, Mirriam L. ([id number]) -------------------------------------------------------------------------------- Patient/Caregiver Education Details Patient Name: Colleen QualeISLEY, Ainsleigh L. Date of Service: 10/12/2015 2:15 PM Medical Record Number: [id number] Patient Account Number: [id number] Date of Birth/Gender: 02/14/1924 (82 y.o. Female) Treating RN: Phillis HaggisPinkerton, Debi Primary Care Physician: Rolm GalaGrandis, Heidi Other Clinician: Referring Physician: Rolm GalaGrandis, Heidi Treating Physician/Extender: Rudene ReBritto, Errol Weeks in Treatment: 4 Education Assessment Education Provided To: Patient Education Topics Provided Wound/Skin Impairment: Handouts: Other: change dressing as ordered Methods: Demonstration, Explain/Verbal Responses: State content correctly Electronic Signature(s) Signed: 10/12/2015 5:34:49 PM By: Alejandro MullingPinkerton, Debra Entered By: Alejandro MullingPinkerton, Debra on 10/12/2015 14:30:51 Pallone, [name] ([id number]) -------------------------------------------------------------------------------- Wound Assessment Details Patient Name: Colleen QualeISLEY, Aimi L. Date of Service: 10/12/2015 2:15 PM Medical Record Number: [id number] Patient Account Number: [id number] Date of Birth/Sex: 11/02/1924 (82 y.o. Female) Treating RN: Curtis Sitesorthy, Joanna Primary Care Physician: Rolm GalaGrandis, Heidi Other Clinician: Referring Physician: Rolm GalaGrandis, Heidi Treating Physician/Extender: Rudene ReBritto, Errol Weeks in Treatment: 4 Wound Status Wound Number: 1 Primary Pressure Ulcer Etiology: Wound Location: Right Calcaneus Wound Status: Open Wounding Event: Pressure Injury Comorbid Deep Vein Thrombosis, Date Acquired: 07/14/2015 History: Hypertension, Dementia Weeks Of Treatment: 4 Clustered Wound: No Photos Wound Measurements Length: (cm)  2.3 Width: (cm) 2.5 Depth: (cm) 0.3 Area: (cm) 4.516 Volume: (cm) 1.355 % Reduction in Area: 18.1% % Reduction in Volume: -22.8% Epithelialization: None Tunneling: No Undermining: No Wound Description Classification: Category/Stage III Wound Margin: Flat and Intact Exudate Amount: Large Exudate Type: Purulent Exudate Color: yellow, brown, green Foul Odor After Cleansing: No Wound Bed Granulation Amount: Medium (34-66%) Exposed Structure Granulation Quality: Red Fascia Exposed: No Necrotic  Amount: Medium (34-66%) Fat Layer Exposed: No Necrotic Quality: Eschar, Adherent Slough Tendon Exposed: No Muscle Exposed: No Herter, Seairra L. ([id number]) Joint Exposed: No Bone Exposed: No Limited to Skin Breakdown Periwound Skin Texture Texture Color No Abnormalities Noted: No No Abnormalities Noted: No Callus: No Atrophie Blanche: No Crepitus: No Cyanosis: No Excoriation: No Ecchymosis: No Fluctuance: No Erythema: Yes Friable: No Erythema Location: Circumferential Induration: No Hemosiderin Staining: No Localized Edema: No Mottled: No Rash: No Pallor: No Scarring: No Rubor: No Moisture Temperature / Pain No Abnormalities Noted: No Temperature: No Abnormality Dry / Scaly: No Tenderness on Palpation: Yes Maceration: No Moist: Yes Wound Preparation Ulcer Cleansing: Rinsed/Irrigated with Saline Topical Anesthetic Applied: Other: lidocaine 4%, Treatment Notes Wound #1 (Right Calcaneus) 1. Cleansed with: Clean wound with Normal Saline 2. Anesthetic Topical Lidocaine 4% cream to wound bed prior to debridement 4. Dressing Applied: Santyl Ointment 5. Secondary Dressing Applied ABD Pad Dry Gauze Kerlix/Conform 7. Secured with Tape Notes netting Electronic Signature(s) Signed: 10/12/2015 5:53:50 PM By: Curtis Sites Entered By: Curtis Sites on 10/12/2015 14:18:06 [name] ([id number] Chavez  ([id number]) -------------------------------------------------------------------------------- Vitals Details Patient Name: [name] Date of Service: 10/12/2015 2:15 PM Medical Record Number: [id number] Patient Account Number: [id number] Date of Birth/Sex: 1924/10/02 (82 y.o. Female) Treating RN: Curtis Sites Primary Care Physician: Rolm Gala Other Clinician: Referring Physician: Rolm Gala Treating Physician/Extender: Rudene Re in Treatment: 4 Vital Signs Time Taken: 14:12 Temperature (F): 97.5 Height (in): 59 Pulse (bpm): 85 Weight (lbs): 108 Respiratory Rate (breaths/min): 16 Body Mass Index (BMI): 21.8 Blood Pressure (mmHg): 138/59 Reference Range: 80 - 120 mg / dl Electronic Signature(s) Signed: 10/12/2015 5:53:50 PM By: Curtis Sites Entered By: Curtis Sites on 10/12/2015 14:14:30

## 2015-10-13 NOTE — Progress Notes (Signed)
[name], [name] ([id number]) Visit Report for 10/12/2015 Chief Complaint Document Details Patient Name: [name], [name] Date of Service: 10/12/2015 2:15 PM Medical Record Number: [id number] Patient Account Number: [id number] Date of Birth/Sex: 1924/12/18 (82 y.o. Female) Treating RN: Curtis Sites Primary Care Physician: Rolm Gala Other Clinician: Referring Physician: Rolm Gala Treating Physician/Extender: Rudene Re in Treatment: 4 Information Obtained from: Patient Chief Complaint Patient is at the clinic for treatment of an open pressure ulcer right heel which she's had for about 8 weeks Electronic Signature(s) Signed: 10/12/2015 2:31:48 PM By: Evlyn Kanner MD, FACS Entered By: Evlyn Kanner on 10/12/2015 14:31:48 [name] ([id number]) -------------------------------------------------------------------------------- Debridement Details Patient Name: [name] Date of Service: 10/12/2015 2:15 PM Medical Record Number: [id number] Patient Account Number: [id number] Date of Birth/Sex: 01/20/1925 (82 y.o. Female) Treating RN: Curtis Sites Primary Care Physician: Rolm Gala Other Clinician: Referring Physician: Rolm Gala Treating Physician/Extender: Rudene Re in Treatment: 4 Debridement Performed for Wound #1 Right Calcaneus Assessment: Performed By: Physician Evlyn Kanner, MD Debridement: Debridement Pre-procedure Yes - 14:23 Verification/Time Out Taken: Start Time: 14:23 Pain Control: Lidocaine 4% Topical Solution Level: Skin/Subcutaneous Tissue Total Area Debrided (L x 2.3 (cm) x 2.5 (cm) = 5.75 (cm) W): Tissue and other Viable, Non-Viable, Exudate, Fibrin/Slough, Subcutaneous material debrided: Instrument: Curette Bleeding: Minimum Hemostasis Achieved: Pressure End Time: 14:25 Procedural Pain: 0 Post Procedural Pain: 0 Response to Treatment: Procedure was tolerated well Post Debridement Measurements of Total  Wound Length: (cm) 2.3 Stage: Category/Stage III Width: (cm) 2.5 Depth: (cm) 0.3 Volume: (cm) 1.355 Character of Wound/Ulcer Post Requires Further Debridement: Debridement Severity of Tissue Post Fat layer exposed Debridement: Post Procedure Diagnosis Same as Pre-procedure Electronic Signature(s) Signed: 10/12/2015 2:31:40 PM By: Evlyn Kanner MD, FACS Signed: 10/12/2015 5:53:50 PM By: Curtis Sites Entered By: Evlyn Kanner on 10/12/2015 14:31:40 [name] ([name], [name] ([id number]) -------------------------------------------------------------------------------- HPI Details Patient Name: [name] Date of Service: 10/12/2015 2:15 PM Medical Record Number: [id number] Patient Account Number: [id number] Date of Birth/Sex: [birth year] (82 y.o. Female) Treating RN: Curtis Sites Primary Care Physician: Rolm Gala Other Clinician: Referring Physician: Rolm Gala Treating Physician/Extender: Rudene Re in Treatment: 4 History of Present Illness Location: right heel Quality: Patient reports experiencing a dull pain to affected area(s). Severity: Patient states wound are getting worse. Duration: Patient has had the wound for < 8 weeks prior to presenting for treatment Timing: Pain in wound is Intermittent (comes and goes Context: The wound occurred when the patient the hip fracture and was in traction for a few days while in hospital Modifying Factors: Other treatment(s) tried include:local care symptomatic treatment Associated Signs and Symptoms: Patient reports having increase discharge. HPI Description: 82 year old patient has been referred to as by her PCP for a pressure ulcer of the right heel. his had this for about 8 weeks Has been treated with duoderm. Has been there for a while it's getting more painful and larger and she also was seen for a recent urinary tract infection. She was given ciprofloxacin for this and then changed on  to nitrofurantoin. She has a past medical history of rheumatic fever, hyperlipidemia, hypertension, scoliosis and osteopenia. never been a smoker. 10/05/2015 -- x-ray of the right heel -- IMPRESSION:No osseous finding of significance. Abnormal appearance of the soft tissues posterior to the heel which could be due to infection or trauma. I do not see an intact Achilles tendon shadow. Electronic Signature(s) Signed: 10/12/2015 2:31:55 PM By: Evlyn Kanner MD, FACS Entered By:  Evlyn Kanner on 10/12/2015 14:31:54 [name], [name] ([id number]) -------------------------------------------------------------------------------- Physical Exam Details Patient Name: [name], [name] Date of Service: 10/12/2015 2:15 PM Medical Record Number: [id number] Patient Account Number: [id number] Date of Birth/Sex: [birth year] (82 y.o. Female) Treating RN: Curtis Sites Primary Care Physician: Rolm Gala Other Clinician: Referring Physician: Rolm Gala Treating Physician/Extender: Rudene Re in Treatment: 4 Constitutional . Pulse regular. Respirations normal and unlabored. Afebrile. . Eyes Nonicteric. Reactive to light. Ears, Nose, Mouth, and Throat Lips, teeth, and gums WNL.Marland Kitchen Moist mucosa without lesions. Neck supple and nontender. No palpable supraclavicular or cervical adenopathy. Normal sized without goiter. Respiratory WNL. No retractions.. Cardiovascular Pedal Pulses WNL. No clubbing, cyanosis or edema. Lymphatic No adneopathy. No adenopathy. No adenopathy. Musculoskeletal Adexa without tenderness or enlargement.. Digits and nails w/o clubbing, cyanosis, infection, petechiae, ischemia, or inflammatory conditions.. Integumentary (Hair, Skin) No suspicious lesions. No crepitus or fluctuance. No peri-wound warmth or erythema. No masses.Marland Kitchen Psychiatric Judgement and insight Intact.. No evidence of depression, anxiety, or agitation.. Notes right calcaneus has significant amount of  subcutaneous debris and using a #3 curet sharply removed as much as possible. It does not probe down to bone. Bleeding controlled with pressure. Electronic Signature(s) Signed: 10/12/2015 2:32:37 PM By: Evlyn Kanner MD, FACS Entered By: Evlyn Kanner on 10/12/2015 14:32:36 [name] ([id number]) -------------------------------------------------------------------------------- Physician Orders Details Patient Name: [name] Date of Service: 10/12/2015 2:15 PM Medical Record Number: [id number] Patient Account Number: [id number] Date of Birth/Sex: 1924-08-16 (82 y.o. Female) Treating RN: Afful, RN, BSN, Gordon Sink Primary Care Physician: Rolm Gala Other Clinician: Referring Physician: Rolm Gala Treating Physician/Extender: Rudene Re in Treatment: 4 Verbal / Phone Orders: Yes Clinician: Afful, RN, BSN, Rita Read Back and Verified: Yes Diagnosis Coding Wound Cleansing Wound #1 Right Calcaneus o Cleanse wound with mild soap and water o May Shower, gently pat wound dry prior to applying new dressing. o May shower with protection. Anesthetic Wound #1 Right Calcaneus o Topical Lidocaine 4% cream applied to wound bed prior to debridement - In Clinic Primary Wound Dressing Wound #1 Right Calcaneus o Santyl Ointment - In clinic, can continue to use if able to afford it. o Medihoney gel - Use if not able to afford Santyl Secondary Dressing Wound #1 Right Calcaneus o Gauze and Kerlix/Conform Dressing Change Frequency Wound #1 Right Calcaneus o Change dressing every day. Follow-up Appointments Wound #1 Right Calcaneus o Return Appointment in 1 week. Off-Loading Wound #1 Right Calcaneus o Heel suspension boot to: - [employer] Additional Orders / Instructions Wound #1 Right Calcaneus o Increase protein intake. o Activity as tolerated Mcjunkins, Marialice L. ([id number]) o Other: - Include these vitamins in your diet, MVI, ZINC, Vit C, Vit  A Home Health Wound #1 Right Calcaneus o Continue Home Health Visits - [facility] o Home Health Nurse may visit PRN to address patientos wound care needs. o FACE TO FACE ENCOUNTER: MEDICARE and MEDICAID PATIENTS: I certify that this patient is under my care and that I had a face-to-face encounter that meets the physician face-to-face encounter requirements with this patient on this date. The encounter with the patient was in whole or in part for the following MEDICAL CONDITION: (primary reason for Home Healthcare) MEDICAL NECESSITY: I certify, that based on my findings, NURSING services are a medically necessary home health service. HOME BOUND STATUS: I certify that my clinical findings support that this patient is homebound (i.e., Due to illness or injury, pt requires aid of supportive devices such as crutches, cane, wheelchairs,  walkers, the use of special transportation or the assistance of another person to leave their place of residence. There is a normal inability to leave the home and doing so requires considerable and taxing effort. Other absences are for medical reasons / religious services and are infrequent or of short duration when for other reasons). o If current dressing causes regression in wound condition, may D/C ordered dressing product/s and apply Normal Saline Moist Dressing daily until next Wound Healing Center / Other MD appointment. Notify Wound Healing Center of regression in wound condition at [phone]. o Please direct any NON-WOUND related issues/requests for orders to patient's Primary Care Physician Electronic Signature(s) Signed: 10/12/2015 4:26:59 PM By: Evlyn KannerBritto, Magdalene Tardiff MD, FACS Signed: 10/12/2015 5:31:48 PM By: Elpidio EricAfful, Rita BSN, RN Entered By: Elpidio EricAfful, Rita on 10/12/2015 14:27:34 [name], [name] ([id number]) -------------------------------------------------------------------------------- Problem List Details Patient Name: [name], [name] Date of  Service: 10/12/2015 2:15 PM Medical Record Number: [id number] Patient Account Number: [id number] Date of Birth/Sex: 08/17/1924 (82 y.o. Female) Treating RN: Curtis Sitesorthy, Joanna Primary Care Physician: Rolm GalaGrandis, Heidi Other Clinician: Referring Physician: Rolm GalaGrandis, Heidi Treating Physician/Extender: Rudene ReBritto, Francie Keeling Weeks in Treatment: 4 Active Problems ICD-10 Encounter Code Description Active Date Diagnosis L89.613 Pressure ulcer of right heel, stage 3 09/14/2015 Yes E44.1 Mild protein-calorie malnutrition 09/14/2015 Yes Inactive Problems Resolved Problems Electronic Signature(s) Signed: 10/12/2015 2:31:23 PM By: Evlyn KannerBritto, Felicity Penix MD, FACS Entered By: Evlyn KannerBritto, Trinaty Bundrick on 10/12/2015 14:31:22 [name], [name] L. ([id number]) -------------------------------------------------------------------------------- Progress Note Details Patient Name: [name], [name] Date of Service: 10/12/2015 2:15 PM Medical Record Number: [id number] Patient Account Number: [id number] Date of Birth/Sex: 08/04/1924 (82 y.o. Female) Treating RN: Curtis Sitesorthy, Joanna Primary Care Physician: Rolm GalaGrandis, Heidi Other Clinician: Referring Physician: Rolm GalaGrandis, Heidi Treating Physician/Extender: Rudene ReBritto, Milbert Bixler Weeks in Treatment: 4 Subjective Chief Complaint Information obtained from Patient Patient is at the clinic for treatment of an open pressure ulcer right heel which she's had for about 8 weeks History of Present Illness (HPI) The following HPI elements were documented for the patient's wound: Location: right heel Quality: Patient reports experiencing a dull pain to affected area(s). Severity: Patient states wound are getting worse. Duration: Patient has had the wound for < 8 weeks prior to presenting for treatment Timing: Pain in wound is Intermittent (comes and goes Context: The wound occurred when the patient the hip fracture and was in traction for a few days while in hospital Modifying Factors: Other treatment(s) tried include:local care  symptomatic treatment Associated Signs and Symptoms: Patient reports having increase discharge. 82 year old patient has been referred to as by her PCP for a pressure ulcer of the right heel. his had this for about 8 weeks Has been treated with duoderm. Has been there for a while it's getting more painful and larger and she also was seen for a recent urinary tract infection. She was given ciprofloxacin for this and then changed on to nitrofurantoin. She has a past medical history of rheumatic fever, hyperlipidemia, hypertension, scoliosis and osteopenia. never been a smoker. 10/05/2015 -- x-ray of the right heel -- IMPRESSION:No osseous finding of significance. Abnormal appearance of the soft tissues posterior to the heel which could be due to infection or trauma. I do not see an intact Achilles tendon shadow. Objective Constitutional Pulse regular. Respirations normal and unlabored. Afebrile. Vitals Time Taken: 2:12 PM, Height: 59 in, Weight: 108 lbs, BMI: 21.8, Temperature: 97.5 F, Pulse: 85 [name], [name] ([id number]) bpm, Respiratory Rate: 16 breaths/min, Blood Pressure: 138/59 mmHg. Eyes Nonicteric. Reactive to light. Ears, Nose, Mouth, and Throat Lips,  teeth, and gums WNL.Marland Kitchen Moist mucosa without lesions. Neck supple and nontender. No palpable supraclavicular or cervical adenopathy. Normal sized without goiter. Respiratory WNL. No retractions.. Cardiovascular Pedal Pulses WNL. No clubbing, cyanosis or edema. Lymphatic No adneopathy. No adenopathy. No adenopathy. Musculoskeletal Adexa without tenderness or enlargement.. Digits and nails w/o clubbing, cyanosis, infection, petechiae, ischemia, or inflammatory conditions.Marland Kitchen Psychiatric Judgement and insight Intact.. No evidence of depression, anxiety, or agitation.. General Notes: right calcaneus has significant amount of subcutaneous debris and using a #3 curet sharply removed as much as possible. It does not probe down to bone.  Bleeding controlled with pressure. Integumentary (Hair, Skin) No suspicious lesions. No crepitus or fluctuance. No peri-wound warmth or erythema. No masses.. Wound #1 status is Open. Original cause of wound was Pressure Injury. The wound is located on the Right Calcaneus. The wound measures 2.3cm length x 2.5cm width x 0.3cm depth; 4.516cm^2 area and 1.355cm^3 volume. The wound is limited to skin breakdown. There is no tunneling or undermining noted. There is a large amount of purulent drainage noted. The wound margin is flat and intact. There is medium (34-66%) red granulation within the wound bed. There is a medium (34-66%) amount of necrotic tissue within the wound bed including Eschar and Adherent Slough. The periwound skin appearance exhibited: Moist, Erythema. The periwound skin appearance did not exhibit: Callus, Crepitus, Excoriation, Fluctuance, Friable, Induration, Localized Edema, Rash, Scarring, Dry/Scaly, Maceration, Atrophie Blanche, Cyanosis, Ecchymosis, Hemosiderin Staining, Mottled, Pallor, Rubor. The surrounding wound skin color is noted with erythema which is circumferential. Periwound temperature was noted as No Abnormality. The periwound has tenderness on palpation. Assessment [name], [name] ([id number]) Active Problems ICD-10 L89.613 - Pressure ulcer of right heel, stage 3 E44.1 - Mild protein-calorie malnutrition Procedures Wound #1 Wound #1 is a Pressure Ulcer located on the Right Calcaneus . There was a Skin/Subcutaneous Tissue Debridement ([id number]) debridement with total area of 5.75 sq cm performed by Evlyn Kanner, MD. with the following instrument(s): Curette to remove Viable and Non-Viable tissue/material including Exudate, Fibrin/Slough, and Subcutaneous after achieving pain control using Lidocaine 4% Topical Solution. A time out was conducted at 14:23, prior to the start of the procedure. A Minimum amount of bleeding was controlled with Pressure. The  procedure was tolerated well with a pain level of 0 throughout and a pain level of 0 following the procedure. Post Debridement Measurements: 2.3cm length x 2.5cm width x 0.3cm depth; 1.355cm^3 volume. Post debridement Stage noted as Category/Stage III. Character of Wound/Ulcer Post Debridement requires further debridement. Severity of Tissue Post Debridement is: Fat layer exposed. Post procedure Diagnosis Wound #1: Same as Pre-Procedure Plan Wound Cleansing: Wound #1 Right Calcaneus: Cleanse wound with mild soap and water May Shower, gently pat wound dry prior to applying new dressing. May shower with protection. Anesthetic: Wound #1 Right Calcaneus: Topical Lidocaine 4% cream applied to wound bed prior to debridement - In Clinic Primary Wound Dressing: Wound #1 Right Calcaneus: Santyl Ointment - In clinic, can continue to use if able to afford it. Medihoney gel - Use if not able to afford Santyl Secondary Dressing: Wound #1 Right Calcaneus: Gauze and Kerlix/Conform Dressing Change Frequency: [name], [name] ([id number]) Wound #1 Right Calcaneus: Change dressing every day. Follow-up Appointments: Wound #1 Right Calcaneus: Return Appointment in 1 week. Off-Loading: Wound #1 Right Calcaneus: Heel suspension boot to: - [employer] Additional Orders / Instructions: Wound #1 Right Calcaneus: Increase protein intake. Activity as tolerated Other: - Include these vitamins in your diet, MVI, ZINC, Vit C,  Vit A Home Health: Wound #1 Right Calcaneus: Continue Home Health Visits - [facility] Health Nurse may visit PRN to address patient s wound care needs. FACE TO FACE ENCOUNTER: MEDICARE and MEDICAID PATIENTS: I certify that this patient is under my care and that I had a face-to-face encounter that meets the physician face-to-face encounter requirements with this patient on this date. The encounter with the patient was in whole or in part for the following MEDICAL CONDITION:  (primary reason for Home Healthcare) MEDICAL NECESSITY: I certify, that based on my findings, NURSING services are a medically necessary home health service. HOME BOUND STATUS: I certify that my clinical findings support that this patient is homebound (i.e., Due to illness or injury, pt requires aid of supportive devices such as crutches, cane, wheelchairs, walkers, the use of special transportation or the assistance of another person to leave their place of residence. There is a normal inability to leave the home and doing so requires considerable and taxing effort. Other absences are for medical reasons / religious services and are infrequent or of short duration when for other reasons). If current dressing causes regression in wound condition, may D/C ordered dressing product/s and apply Normal Saline Moist Dressing daily until next Wound Healing Center / Other MD appointment. Notify Wound Healing Center of regression in wound condition at [phone]. Please direct any NON-WOUND related issues/requests for orders to patient's Primary Care Physician I have recommended: 1. Santyl ointment locally to be applied daily. If this is not affordable she should use Medihoney. 2. Offloading has been discussed in great detail 3. Adequate protein, vitamin A, vitamin C and zinc 4. regular visits to the wound center Electronic Signature(s) Signed: 10/12/2015 2:33:20 PM By: Evlyn Kanner MD, FACS Entered By: Evlyn Kanner on 10/12/2015 14:33:19 [name] ([id number]) -------------------------------------------------------------------------------- SuperBill Details Patient Name: [name] Date of Service: 10/12/2015 Medical Record Number: [id number] Patient Account Number: [id number] Date of Birth/Sex: 01/26/1925 (82 y.o. Female) Treating RN: Curtis Sites Primary Care Physician: Rolm Gala Other Clinician: Referring Physician: Rolm Gala Treating Physician/Extender: Rudene Re in Treatment: 4 Diagnosis Coding ICD-10 Codes Code Description [phone] Pressure ulcer of right heel, stage 3 E44.1 Mild protein-calorie malnutrition Facility Procedures CPT4 Code: [id number] Description: 11042 - DEB SUBQ TISSUE 20 SQ CM/< ICD-10 Description Diagnosis L89.613 Pressure ulcer of right heel, stage 3 E44.1 Mild protein-calorie malnutrition Modifier: Quantity: 1 Physician Procedures CPT4 Code: [id number] Description: 11042 - WC PHYS SUBQ TISS 20 SQ CM ICD-10 Description Diagnosis L89.613 Pressure ulcer of right heel, stage 3 E44.1 Mild protein-calorie malnutrition Modifier: Quantity: 1 Electronic Signature(s) Signed: 10/12/2015 2:33:37 PM By: Evlyn Kanner MD, FACS Entered By: Evlyn Kanner on 10/12/2015 14:33:36

## 2015-10-19 ENCOUNTER — Encounter: Payer: Medicare Other | Admitting: Surgery

## 2015-10-19 DIAGNOSIS — L89613 Pressure ulcer of right heel, stage 3: Secondary | ICD-10-CM | POA: Diagnosis not present

## 2015-10-19 NOTE — Progress Notes (Signed)
[name], [name] ([id number]) Visit Report for 10/19/2015 Arrival Information Details Patient Name: [name], [name] Date of Service: 10/19/2015 10:00 AM Medical Record Number: [id number] Patient Account Number: [id number] Date of Birth/Sex: 1924/04/01 (82 y.o. Female) Treating RN: Curtis Sites Primary Care Physician: Rolm Gala Other Clinician: Referring Physician: Rolm Gala Treating Physician/Extender: Rudene Re in Treatment: 5 Visit Information History Since Last Visit Added or deleted any medications: No Patient Arrived: Wheel Chair Any new allergies or adverse reactions: No Arrival Time: 10:11 Had a fall or experienced change in No Accompanied By: dtr activities of daily living that may affect Transfer Assistance: Manual risk of falls: Patient Identification Verified: Yes Signs or symptoms of abuse/neglect since last No Secondary Verification Process Yes visito Completed: Hospitalized since last visit: No Patient Requires Transmission- No Pain Present Now: No Based Precautions: Patient Has Alerts: Yes Patient Alerts: Patient on Blood Thinner eliquis Electronic Signature(s) Signed: 10/19/2015 4:30:55 PM By: Curtis Sites Entered By: Curtis Sites on 10/19/2015 10:11:58 [name] ([id number]) -------------------------------------------------------------------------------- Encounter Discharge Information Details Patient Name: [name] Date of Service: 10/19/2015 10:00 AM Medical Record Number: [id number] Patient Account Number: [id number] Date of Birth/Sex: 04-Dec-1924 (82 y.o. Female) Treating RN: Curtis Sites Primary Care Physician: Rolm Gala Other Clinician: Referring Physician: Rolm Gala Treating Physician/Extender: Rudene Re in Treatment: 5 Encounter Discharge Information Items Discharge Pain Level: 0 Discharge Condition: Stable Ambulatory Status: Wheelchair Discharge Destination: Home Transportation:  Private Auto Accompanied By: dtr Schedule Follow-up Appointment: Yes Medication Reconciliation completed and provided to Patient/Care No Bradin Mcadory: Provided on Clinical Summary of Care: 10/19/2015 Form Type Recipient Paper Patient DI Electronic Signature(s) Signed: 10/19/2015 10:52:55 AM By: Curtis Sites Previous Signature: 10/19/2015 10:48:28 AM Version By: Gwenlyn Perking Entered By: Curtis Sites on 10/19/2015 10:52:55 [name] ([id number]) -------------------------------------------------------------------------------- Lower Extremity Assessment Details Patient Name: [name] Date of Service: 10/19/2015 10:00 AM Medical Record Number: [id number] Patient Account Number: [id number] Date of Birth/Sex: 10-06-1924 (82 y.o. Female) Treating RN: Curtis Sites Primary Care Physician: Rolm Gala Other Clinician: Referring Physician: Rolm Gala Treating Physician/Extender: Rudene Re in Treatment: 5 Vascular Assessment Pulses: Posterior Tibial Dorsalis Pedis Palpable: [Right:Yes] Extremity colors, hair growth, and conditions: Extremity Color: [Right:Normal] Hair Growth on Extremity: [Right:Yes] Temperature of Extremity: [Right:Warm] Capillary Refill: [Right:< 3 seconds] Electronic Signature(s) Signed: 10/19/2015 4:30:55 PM By: Curtis Sites Entered By: Curtis Sites on 10/19/2015 10:18:34 Farabaugh, [name] ([id number]) -------------------------------------------------------------------------------- Multi Wound Chart Details Patient Name: [name] Date of Service: 10/19/2015 10:00 AM Medical Record Number: [id number] Patient Account Number: [id number] Date of Birth/Sex: 04/21/1924 (82 y.o. Female) Treating RN: Clover Mealy, RN, BSN, Cornelia Sink Primary Care Physician: Rolm Gala Other Clinician: Referring Physician: Rolm Gala Treating Physician/Extender: Rudene Re in Treatment: 5 Vital Signs Height(in): 59 Pulse(bpm):  83 Weight(lbs): 108 Blood Pressure 120/87 (mmHg): Body Mass Index(BMI): 22 Temperature(F): 97.3 Respiratory Rate 16 (breaths/min): Photos: [N/A:N/A] Wound Location: Right Calcaneus N/A N/A Wounding Event: Pressure Injury N/A N/A Primary Etiology: Pressure Ulcer N/A N/A Comorbid History: Deep Vein Thrombosis, N/A N/A Hypertension, Dementia Date Acquired: 07/14/2015 N/A N/A Weeks of Treatment: 5 N/A N/A Wound Status: Open N/A N/A Measurements L x W x D 2.2x2.3x0.2 N/A N/A (cm) Area (cm) : 3.974 N/A N/A Volume (cm) : 0.795 N/A N/A % Reduction in Area: 27.90% N/A N/A % Reduction in Volume: 27.90% N/A N/A Classification: Category/Stage III N/A N/A Exudate Amount: Large N/A N/A Exudate Type: Purulent N/A N/A Exudate Color: yellow, brown, green N/A N/A Wound Margin: Flat and Intact  N/A N/A Granulation Amount: Medium (34-66%) N/A N/A Granulation Quality: Red N/A N/A Necrotic Amount: Medium (34-66%) N/A N/A Holt, Saleemah L. ([id number]) Necrotic Tissue: Eschar, Adherent Slough N/A N/A Exposed Structures: Fascia: No N/A N/A Fat: No Tendon: No Muscle: No Joint: No Bone: No Limited to Skin Breakdown Epithelialization: None N/A N/A Periwound Skin Texture: Edema: No N/A N/A Excoriation: No Induration: No Callus: No Crepitus: No Fluctuance: No Friable: No Rash: No Scarring: No Periwound Skin Moist: Yes N/A N/A Moisture: Maceration: No Dry/Scaly: No Periwound Skin Color: Erythema: Yes N/A N/A Atrophie Blanche: No Cyanosis: No Ecchymosis: No Hemosiderin Staining: No Mottled: No Pallor: No Rubor: No Erythema Location: Circumferential N/A N/A Temperature: No Abnormality N/A N/A Tenderness on Yes N/A N/A Palpation: Wound Preparation: Ulcer Cleansing: N/A N/A Rinsed/Irrigated with Saline Topical Anesthetic Applied: Other: lidocaine 4% Treatment Notes Electronic Signature(s) Signed: 10/19/2015 12:49:55 PM By: Elpidio Eric BSN, RN Entered By: Elpidio Eric on  10/19/2015 10:33:50 [name] ([id number]) -------------------------------------------------------------------------------- Multi-Disciplinary Care Plan Details Patient Name: [name] Date of Service: 10/19/2015 10:00 AM Medical Record Number: [id number] Patient Account Number: [id number] Date of Birth/Sex: 08-09-1924 (82 y.o. Female) Treating RN: Afful, RN, BSN, Acworth Sink Primary Care Physician: Rolm Gala Other Clinician: Referring Physician: Rolm Gala Treating Physician/Extender: Rudene Re in Treatment: 5 Active Inactive Abuse / Safety / Falls / Self Care Management Nursing Diagnoses: Impaired home maintenance Impaired physical mobility Potential for falls Self care deficit: actual or potential Goals: Patient will remain injury free Date Initiated: 09/14/2015 Goal Status: Active Patient/caregiver will verbalize understanding of skin care regimen Date Initiated: 09/14/2015 Goal Status: Active Patient/caregiver will verbalize/demonstrate measure taken to improve self care Date Initiated: 09/14/2015 Goal Status: Active Patient/caregiver will verbalize/demonstrate measures taken to improve the patient's personal safety Date Initiated: 09/14/2015 Goal Status: Active Patient/caregiver will verbalize/demonstrate measures taken to prevent injury and/or falls Date Initiated: 09/14/2015 Goal Status: Active Patient/caregiver will verbalize/demonstrate understanding of what to do in case of emergency Date Initiated: 09/14/2015 Goal Status: Active Interventions: Assess fall risk on admission and as needed Assess: immobility, friction, shearing, incontinence upon admission and as needed Assess impairment of mobility on admission and as needed per policy Assess self care needs on admission and as needed Provide education on basic hygiene Provide education on personal and home safety [name], [name] ([id number]) Provide education on safe  transfers Notes: Orientation to the Wound Care Program Nursing Diagnoses: Knowledge deficit related to the wound healing center program Goals: Patient/caregiver will verbalize understanding of the Wound Healing Center Program Date Initiated: 09/14/2015 Goal Status: Active Interventions: Provide education on orientation to the wound center Notes: Pressure Nursing Diagnoses: Knowledge deficit related to causes and risk factors for pressure ulcer development Knowledge deficit related to management of pressures ulcers Potential for impaired tissue integrity related to pressure, friction, moisture, and shear Goals: Patient will remain free from development of additional pressure ulcers Date Initiated: 09/14/2015 Goal Status: Active Patient will remain free of pressure ulcers Date Initiated: 09/14/2015 Goal Status: Active Patient/caregiver will verbalize risk factors for pressure ulcer development Date Initiated: 09/14/2015 Goal Status: Active Patient/caregiver will verbalize understanding of pressure ulcer management Date Initiated: 09/14/2015 Goal Status: Active Interventions: Assess: immobility, friction, shearing, incontinence upon admission and as needed Assess offloading mechanisms upon admission and as needed Assess potential for pressure ulcer upon admission and as needed Provide education on pressure ulcers Treatment Activities: [name], [name] ([id number]) Patient referred for pressure reduction/relief devices : 09/14/2015 Patient referred for seating evaluation to ensure proper offloading :  09/14/2015 Pressure reduction/relief device ordered : 09/14/2015 Notes: Wound/Skin Impairment Nursing Diagnoses: Impaired tissue integrity Knowledge deficit related to ulceration/compromised skin integrity Goals: Patient/caregiver will verbalize understanding of skin care regimen Date Initiated: 09/14/2015 Goal Status: Active Ulcer/skin breakdown will have a volume reduction of 30% by  week 4 Date Initiated: 09/14/2015 Goal Status: Active Ulcer/skin breakdown will have a volume reduction of 50% by week 8 Date Initiated: 09/14/2015 Goal Status: Active Ulcer/skin breakdown will have a volume reduction of 80% by week 12 Date Initiated: 09/14/2015 Goal Status: Active Ulcer/skin breakdown will heal within 14 weeks Date Initiated: 09/14/2015 Goal Status: Active Interventions: Assess patient/caregiver ability to obtain necessary supplies Assess patient/caregiver ability to perform ulcer/skin care regimen upon admission and as needed Assess ulceration(s) every visit Provide education on ulcer and skin care Treatment Activities: Referred to DME Nithila Sumners for dressing supplies : 09/14/2015 Skin care regimen initiated : 09/14/2015 Topical wound management initiated : 09/14/2015 Notes: Electronic Signature(s) Signed: 10/19/2015 12:49:55 PM By: Elpidio EricAfful, Rita BSN, RN Entered By: Elpidio EricAfful, Rita on 10/19/2015 10:32:56 Colleen QualeISLEY, [name] ([id number] Mallick) [name], Colleen GianottiROTHY L. ([id number]) -------------------------------------------------------------------------------- Pain Assessment Details Patient Name: Colleen QualeISLEY, [name] Date of Service: 10/19/2015 10:00 AM Medical Record Number: [id number] Patient Account Number: [id number] Date of Birth/Sex: 10/08/1924 (82 y.o. Female) Treating RN: Curtis Sitesorthy, Joanna Primary Care Physician: Rolm GalaGrandis, Heidi Other Clinician: Referring Physician: Rolm GalaGrandis, Heidi Treating Physician/Extender: Rudene ReBritto, Errol Weeks in Treatment: 5 Active Problems Location of Pain Severity and Description of Pain Patient Has Paino No Site Locations Pain Management and Medication Current Pain Management: Notes Topical or injectable lidocaine is offered to patient for acute pain when surgical debridement is performed. If needed, Patient is instructed to use over the counter pain medication for the following 24-48 hours after debridement. Wound care MDs do not prescribed pain  medications. Patient has chronic pain or uncontrolled pain. Patient has been instructed to make an appointment with their Primary Care Physician for pain management. Electronic Signature(s) Signed: 10/19/2015 4:30:55 PM By: Curtis Sitesorthy, Joanna Entered By: Curtis Sitesorthy, Joanna on 10/19/2015 10:12:06 Colleen QualeISLEY, [name] ([id number]) -------------------------------------------------------------------------------- Patient/Caregiver Education Details Patient Name: Colleen QualeISLEY, [name] Date of Service: 10/19/2015 10:00 AM Medical Record Number: [id number] Patient Account Number: [id number] Date of Birth/Gender: 09/03/1924 (82 y.o. Female) Treating RN: Curtis Sitesorthy, Joanna Primary Care Physician: Rolm GalaGrandis, Heidi Other Clinician: Referring Physician: Rolm GalaGrandis, Heidi Treating Physician/Extender: Rudene ReBritto, Errol Weeks in Treatment: 5 Education Assessment Education Provided To: Patient and Caregiver Education Topics Provided Wound/Skin Impairment: Handouts: Other: wound care as ordered Methods: Demonstration, Explain/Verbal Responses: State content correctly Electronic Signature(s) Signed: 10/19/2015 4:30:55 PM By: Curtis Sitesorthy, Joanna Entered By: Curtis Sitesorthy, Joanna on 10/19/2015 10:53:16 Piechota, Colleen GianottiROTHY L. ([id number]) -------------------------------------------------------------------------------- Wound Assessment Details Patient Name: Colleen QualeISLEY, [name] Date of Service: 10/19/2015 10:00 AM Medical Record Number: [id number] Patient Account Number: [id number] Date of Birth/Sex: 11/20/1924 (82 y.o. Female) Treating RN: Curtis Sitesorthy, Joanna Primary Care Physician: Rolm GalaGrandis, Heidi Other Clinician: Referring Physician: Rolm GalaGrandis, Heidi Treating Physician/Extender: Rudene ReBritto, Errol Weeks in Treatment: 5 Wound Status Wound Number: 1 Primary Pressure Ulcer Etiology: Wound Location: Right Calcaneus Wound Status: Open Wounding Event: Pressure Injury Comorbid Deep Vein Thrombosis, Date Acquired: 07/14/2015 History: Hypertension, Dementia Weeks Of  Treatment: 5 Clustered Wound: No Photos Wound Measurements Length: (cm) 2.2 Width: (cm) 2.3 Depth: (cm) 0.2 Area: (cm) 3.974 Volume: (cm) 0.795 % Reduction in Area: 27.9% % Reduction in Volume: 27.9% Epithelialization: None Tunneling: No Undermining: No Wound Description Classification: Category/Stage III Wound Margin: Flat and Intact Exudate Amount: Large Exudate Type: Purulent Exudate Color: yellow, brown, green Foul Odor After Cleansing:  No Wound Bed Granulation Amount: Medium (34-66%) Exposed Structure Granulation Quality: Red Fascia Exposed: No Necrotic Amount: Medium (34-66%) Fat Layer Exposed: No Necrotic Quality: Eschar, Adherent Slough Tendon Exposed: No Muscle Exposed: No [name], [name] ([id number]) Joint Exposed: No Bone Exposed: No Limited to Skin Breakdown Periwound Skin Texture Texture Color No Abnormalities Noted: No No Abnormalities Noted: No Callus: No Atrophie Blanche: No Crepitus: No Cyanosis: No Excoriation: No Ecchymosis: No Fluctuance: No Erythema: Yes Friable: No Erythema Location: Circumferential Induration: No Hemosiderin Staining: No Localized Edema: No Mottled: No Rash: No Pallor: No Scarring: No Rubor: No Moisture Temperature / Pain No Abnormalities Noted: No Temperature: No Abnormality Dry / Scaly: No Tenderness on Palpation: Yes Maceration: No Moist: Yes Wound Preparation Ulcer Cleansing: Rinsed/Irrigated with Saline Topical Anesthetic Applied: Other: lidocaine 4%, Treatment Notes Wound #1 (Right Calcaneus) 1. Cleansed with: Clean wound with Normal Saline 2. Anesthetic Topical Lidocaine 4% cream to wound bed prior to debridement 4. Dressing Applied: Santyl Ointment 5. Secondary Dressing Applied Guaze, ABD and kerlix/Conform Notes netting Electronic Signature(s) Signed: 10/19/2015 4:30:55 PM By: Curtis Sites Entered By: Curtis Sites on 10/19/2015 10:19:01 [name]  ([id number]) -------------------------------------------------------------------------------- Vitals Details Patient Name: [name] Date of Service: 10/19/2015 10:00 AM Medical Record Number: [id number] Patient Account Number: [id number] Date of Birth/Sex: 12/28/1924 (82 y.o. Female) Treating RN: Curtis Sites Primary Care Physician: Rolm Gala Other Clinician: Referring Physician: Rolm Gala Treating Physician/Extender: Rudene Re in Treatment: 5 Vital Signs Time Taken: 10:12 Temperature (F): 97.3 Height (in): 59 Pulse (bpm): 83 Weight (lbs): 108 Respiratory Rate (breaths/min): 16 Body Mass Index (BMI): 21.8 Blood Pressure (mmHg): 120/87 Reference Range: 80 - 120 mg / dl Electronic Signature(s) Signed: 10/19/2015 4:30:55 PM By: Curtis Sites Entered By: Curtis Sites on 10/19/2015 10:13:51

## 2015-10-20 NOTE — Progress Notes (Signed)
[name], [name] ([id number]) Visit Report for 10/19/2015 Chief Complaint Document Details Patient Name: [name], [name] Date of Service: 10/19/2015 10:00 AM Medical Record Number: [id number] Patient Account Number: [id number] Date of Birth/Sex: 1924/10/27 (82 y.o. Female) Treating RN: Curtis Sites Primary Care Physician: Rolm Gala Other Clinician: Referring Physician: Rolm Gala Treating Physician/Extender: Rudene Re in Treatment: 5 Information Obtained from: Patient Chief Complaint Patient is at the clinic for treatment of an open pressure ulcer right heel which she's had for about 8 weeks Electronic Signature(s) Signed: 10/19/2015 10:40:00 AM By: Evlyn Kanner MD, FACS Entered By: Evlyn Kanner on 10/19/2015 10:40:00 [name] ([id number]) -------------------------------------------------------------------------------- Debridement Details Patient Name: [name] Date of Service: 10/19/2015 10:00 AM Medical Record Number: [id number] Patient Account Number: [id number] Date of Birth/Sex: 1924/07/02 (82 y.o. Female) Treating RN: Curtis Sites Primary Care Physician: Rolm Gala Other Clinician: Referring Physician: Rolm Gala Treating Physician/Extender: Rudene Re in Treatment: 5 Debridement Performed for Wound #1 Right Calcaneus Assessment: Performed By: Physician Evlyn Kanner, MD Debridement: Debridement Pre-procedure Yes - 10:33 Verification/Time Out Taken: Start Time: 10:33 Pain Control: Lidocaine 4% Topical Solution Level: Skin/Subcutaneous Tissue Total Area Debrided (L x 2.2 (cm) x 2.3 (cm) = 5.06 (cm) W): Tissue and other Non-Viable, Exudate, Fat, Fibrin/Slough, Subcutaneous material debrided: Instrument: Curette Bleeding: Minimum Hemostasis Achieved: Pressure End Time: 10:37 Procedural Pain: 0 Post Procedural Pain: 0 Response to Treatment: Procedure was tolerated well Post Debridement Measurements of Total  Wound Length: (cm) 2.2 Stage: Category/Stage III Width: (cm) 2.3 Depth: (cm) 0.2 Volume: (cm) 0.795 Character of Wound/Ulcer Post Requires Further Debridement: Debridement Severity of Tissue Post Fat layer exposed Debridement: Post Procedure Diagnosis Same as Pre-procedure Electronic Signature(s) Signed: 10/19/2015 10:39:52 AM By: Evlyn Kanner MD, FACS Signed: 10/19/2015 4:30:55 PM By: Curtis Sites Entered By: Evlyn Kanner on 10/19/2015 10:39:52 [name] ([name], [name] ([id number]) -------------------------------------------------------------------------------- HPI Details Patient Name: [name] Date of Service: 10/19/2015 10:00 AM Medical Record Number: [id number] Patient Account Number: [id number] Date of Birth/Sex: [birth year] (82 y.o. Female) Treating RN: Curtis Sites Primary Care Physician: Rolm Gala Other Clinician: Referring Physician: Rolm Gala Treating Physician/Extender: Rudene Re in Treatment: 5 History of Present Illness Location: right heel Quality: Patient reports experiencing a dull pain to affected area(s). Severity: Patient states wound are getting worse. Duration: Patient has had the wound for < 8 weeks prior to presenting for treatment Timing: Pain in wound is Intermittent (comes and goes Context: The wound occurred when the patient the hip fracture and was in traction for a few days while in hospital Modifying Factors: Other treatment(s) tried include:local care symptomatic treatment Associated Signs and Symptoms: Patient reports having increase discharge. HPI Description: 82 year old patient has been referred to as by her PCP for a pressure ulcer of the right heel. his had this for about 8 weeks Has been treated with duoderm. Has been there for a while it's getting more painful and larger and she also was seen for a recent urinary tract infection. She was given ciprofloxacin for this and then changed  on to nitrofurantoin. She has a past medical history of rheumatic fever, hyperlipidemia, hypertension, scoliosis and osteopenia. never been a smoker. 10/05/2015 -- x-ray of the right heel -- IMPRESSION:No osseous finding of significance. Abnormal appearance of the soft tissues posterior to the heel which could be due to infection or trauma. I do not see an intact Achilles tendon shadow. Electronic Signature(s) Signed: 10/19/2015 10:40:05 AM By: Evlyn Kanner MD, FACS Entered By:  Evlyn Kanner on 10/19/2015 10:40:04 [name], [name] ([id number]) -------------------------------------------------------------------------------- Physical Exam Details Patient Name: [name], [name] Date of Service: 10/19/2015 10:00 AM Medical Record Number: [id number] Patient Account Number: [id number] Date of Birth/Sex: Feb 28, 1924 (82 y.o. Female) Treating RN: Curtis Sites Primary Care Physician: Rolm Gala Other Clinician: Referring Physician: Rolm Gala Treating Physician/Extender: Rudene Re in Treatment: 5 Constitutional . Pulse regular. Respirations normal and unlabored. Afebrile. . Eyes Nonicteric. Reactive to light. Ears, Nose, Mouth, and Throat Lips, teeth, and gums WNL.Marland Kitchen Moist mucosa without lesions. Neck supple and nontender. No palpable supraclavicular or cervical adenopathy. Normal sized without goiter. Respiratory WNL. No retractions.. Cardiovascular Pedal Pulses WNL. No clubbing, cyanosis or edema. Lymphatic No adneopathy. No adenopathy. No adenopathy. Musculoskeletal Adexa without tenderness or enlargement.. Digits and nails w/o clubbing, cyanosis, infection, petechiae, ischemia, or inflammatory conditions.. Integumentary (Hair, Skin) No suspicious lesions. No crepitus or fluctuance. No peri-wound warmth or erythema. No masses.Marland Kitchen Psychiatric Judgement and insight Intact.. No evidence of depression, anxiety, or agitation.. Notes the wound on the right calcaneus has  significant amount of debris and using a #3 curet Sharply developed as much as I could and brisk bleeding controlled with pressure. Electronic Signature(s) Signed: 10/19/2015 10:40:51 AM By: Evlyn Kanner MD, FACS Entered By: Evlyn Kanner on 10/19/2015 10:40:50 [name] ([id number]) -------------------------------------------------------------------------------- Physician Orders Details Patient Name: [name] Date of Service: 10/19/2015 10:00 AM Medical Record Number: [id number] Patient Account Number: [id number] Date of Birth/Sex: 01-May-1924 (82 y.o. Female) Treating RN: Afful, RN, BSN, [place] Primary Care Physician: Rolm Gala Other Clinician: Referring Physician: Rolm Gala Treating Physician/Extender: Rudene Re in Treatment: 5 Verbal / Phone Orders: Yes Clinician: Afful, RN, BSN, Rita Read Back and Verified: Yes Diagnosis Coding Wound Cleansing Wound #1 Right Calcaneus o Cleanse wound with mild soap and water o May Shower, gently pat wound dry prior to applying new dressing. o May shower with protection. Anesthetic Wound #1 Right Calcaneus o Topical Lidocaine 4% cream applied to wound bed prior to debridement - In Clinic Primary Wound Dressing Wound #1 Right Calcaneus o Santyl Ointment - In clinic, can continue to use if able to afford it. o Medihoney gel - Use if not able to afford Santyl Secondary Dressing Wound #1 Right Calcaneus o Gauze and Kerlix/Conform Dressing Change Frequency Wound #1 Right Calcaneus o Change dressing every day. - When Home Health not visiting o Three times weekly - For Home Health Specifics Follow-up Appointments Wound #1 Right Calcaneus o Return Appointment in 1 week. Off-Loading Wound #1 Right Calcaneus o Heel suspension boot to: - [employer] Additional Orders / Instructions Wound #1 Right Calcaneus o Increase protein intake. GUYNELL, KLEIBER ([id number]) o Activity as  tolerated o Other: - Include these vitamins in your diet, MVI, ZINC, Vit C, Vit A Home Health Wound #1 Right Calcaneus o Continue Home Health Visits - [facility] o Home Health Nurse may visit PRN to address patientos wound care needs. o FACE TO FACE ENCOUNTER: MEDICARE and MEDICAID PATIENTS: I certify that this patient is under my care and that I had a face-to-face encounter that meets the physician face-to-face encounter requirements with this patient on this date. The encounter with the patient was in whole or in part for the following MEDICAL CONDITION: (primary reason for Home Healthcare) MEDICAL NECESSITY: I certify, that based on my findings, NURSING services are a medically necessary home health service. HOME BOUND STATUS: I certify that my clinical findings support that this patient is homebound (i.[name]., Due to  illness or injury, pt requires aid of supportive devices such as crutches, cane, wheelchairs, walkers, the use of special transportation or the assistance of another person to leave their place of residence. There is a normal inability to leave the home and doing so requires considerable and taxing effort. Other absences are for medical reasons / religious services and are infrequent or of short duration when for other reasons). o If current dressing causes regression in wound condition, may D/C ordered dressing product/s and apply Normal Saline Moist Dressing daily until next Wound Healing Center / Other MD appointment. Notify Wound Healing Center of regression in wound condition at [phone]. o Please direct any NON-WOUND related issues/requests for orders to patient's Primary Care Physician Electronic Signature(s) Signed: 10/19/2015 3:58:34 PM By: Evlyn Kanner MD, FACS Signed: 10/19/2015 5:04:09 PM By: Elpidio Eric BSN, RN Previous Signature: 10/19/2015 12:46:10 PM Version By: Evlyn Kanner MD, FACS Previous Signature: 10/19/2015 12:49:55 PM Version By: Elpidio Eric  BSN, RN Entered By: Elpidio Eric on 10/19/2015 12:53:00 [name] ([id number]) -------------------------------------------------------------------------------- Problem List Details Patient Name: [name] Date of Service: 10/19/2015 10:00 AM Medical Record Number: [id number] Patient Account Number: [id number] Date of Birth/Sex: [birth year] (82 y.o. Female) Treating RN: Curtis Sites Primary Care Physician: Rolm Gala Other Clinician: Referring Physician: Rolm Gala Treating Physician/Extender: Rudene Re in Treatment: 5 Active Problems ICD-10 Encounter Code Description Active Date Diagnosis L89.613 Pressure ulcer of right heel, stage 3 09/14/2015 Yes E44.1 Mild protein-calorie malnutrition 09/14/2015 Yes Inactive Problems Resolved Problems Electronic Signature(s) Signed: 10/19/2015 10:39:30 AM By: Evlyn Kanner MD, FACS Entered By: Evlyn Kanner on 10/19/2015 10:39:30 [name] ([id number]) -------------------------------------------------------------------------------- Progress Note Details Patient Name: [name] Date of Service: 10/19/2015 10:00 AM Medical Record Number: [id number] Patient Account Number: [id number] Date of Birth/Sex: Feb 14, 1924 (82 y.o. Female) Treating RN: Curtis Sites Primary Care Physician: Rolm Gala Other Clinician: Referring Physician: Rolm Gala Treating Physician/Extender: Rudene Re in Treatment: 5 Subjective Chief Complaint Information obtained from Patient Patient is at the clinic for treatment of an open pressure ulcer right heel which she's had for about 8 weeks History of Present Illness (HPI) The following HPI elements were documented for the patient's wound: Location: right heel Quality: Patient reports experiencing a dull pain to affected area(s). Severity: Patient states wound are getting worse. Duration: Patient has had the wound for < 8 weeks prior to presenting for  treatment Timing: Pain in wound is Intermittent (comes and goes Context: The wound occurred when the patient the hip fracture and was in traction for a few days while in hospital Modifying Factors: Other treatment(s) tried include:local care symptomatic treatment Associated Signs and Symptoms: Patient reports having increase discharge. 82 year old patient has been referred to as by her PCP for a pressure ulcer of the right heel. his had this for about 8 weeks Has been treated with duoderm. Has been there for a while it's getting more painful and larger and she also was seen for a recent urinary tract infection. She was given ciprofloxacin for this and then changed on to nitrofurantoin. She has a past medical history of rheumatic fever, hyperlipidemia, hypertension, scoliosis and osteopenia. never been a smoker. 10/05/2015 -- x-ray of the right heel -- IMPRESSION:No osseous finding of significance. Abnormal appearance of the soft tissues posterior to the heel which could be due to infection or trauma. I do not see an intact Achilles tendon shadow. Objective Constitutional Pulse regular. Respirations normal and unlabored. Afebrile. Vitals Time Taken: 10:12 AM, Height:  59 in, Weight: 108 lbs, BMI: 21.8, Temperature: 97.3 F, Pulse: 83 [name], [name] ([id number]) bpm, Respiratory Rate: 16 breaths/min, Blood Pressure: 120/87 mmHg. Eyes Nonicteric. Reactive to light. Ears, Nose, Mouth, and Throat Lips, teeth, and gums WNL.Marland Kitchen Moist mucosa without lesions. Neck supple and nontender. No palpable supraclavicular or cervical adenopathy. Normal sized without goiter. Respiratory WNL. No retractions.. Cardiovascular Pedal Pulses WNL. No clubbing, cyanosis or edema. Lymphatic No adneopathy. No adenopathy. No adenopathy. Musculoskeletal Adexa without tenderness or enlargement.. Digits and nails w/o clubbing, cyanosis, infection, petechiae, ischemia, or inflammatory  conditions.Marland Kitchen Psychiatric Judgement and insight Intact.. No evidence of depression, anxiety, or agitation.. General Notes: the wound on the right calcaneus has significant amount of debris and using a #3 curet Sharply developed as much as I could and brisk bleeding controlled with pressure. Integumentary (Hair, Skin) No suspicious lesions. No crepitus or fluctuance. No peri-wound warmth or erythema. No masses.. Wound #1 status is Open. Original cause of wound was Pressure Injury. The wound is located on the Right Calcaneus. The wound measures 2.2cm length x 2.3cm width x 0.2cm depth; 3.974cm^2 area and 0.795cm^3 volume. The wound is limited to skin breakdown. There is no tunneling or undermining noted. There is a large amount of purulent drainage noted. The wound margin is flat and intact. There is medium (34-66%) red granulation within the wound bed. There is a medium (34-66%) amount of necrotic tissue within the wound bed including Eschar and Adherent Slough. The periwound skin appearance exhibited: Moist, Erythema. The periwound skin appearance did not exhibit: Callus, Crepitus, Excoriation, Fluctuance, Friable, Induration, Localized Edema, Rash, Scarring, Dry/Scaly, Maceration, Atrophie Blanche, Cyanosis, Ecchymosis, Hemosiderin Staining, Mottled, Pallor, Rubor. The surrounding wound skin color is noted with erythema which is circumferential. Periwound temperature was noted as No Abnormality. The periwound has tenderness on palpation. Assessment [name], [name] ([id number]) Active Problems ICD-10 L89.613 - Pressure ulcer of right heel, stage 3 E44.1 - Mild protein-calorie malnutrition Procedures Wound #1 Wound #1 is a Pressure Ulcer located on the Right Calcaneus . There was a Skin/Subcutaneous Tissue Debridement ([id number]) debridement with total area of 5.06 sq cm performed by Evlyn Kanner, MD. with the following instrument(s): Curette to remove Non-Viable tissue/material  including Exudate, Fat, Fibrin/Slough, and Subcutaneous after achieving pain control using Lidocaine 4% Topical Solution. A time out was conducted at 10:33, prior to the start of the procedure. A Minimum amount of bleeding was controlled with Pressure. The procedure was tolerated well with a pain level of 0 throughout and a pain level of 0 following the procedure. Post Debridement Measurements: 2.2cm length x 2.3cm width x 0.2cm depth; 0.795cm^3 volume. Post debridement Stage noted as Category/Stage III. Character of Wound/Ulcer Post Debridement requires further debridement. Severity of Tissue Post Debridement is: Fat layer exposed. Post procedure Diagnosis Wound #1: Same as Pre-Procedure Plan Wound Cleansing: Wound #1 Right Calcaneus: Cleanse wound with mild soap and water May Shower, gently pat wound dry prior to applying new dressing. May shower with protection. Anesthetic: Wound #1 Right Calcaneus: Topical Lidocaine 4% cream applied to wound bed prior to debridement - In Clinic Primary Wound Dressing: Wound #1 Right Calcaneus: Santyl Ointment - In clinic, can continue to use if able to afford it. Medihoney gel - Use if not able to afford Santyl Secondary Dressing: Wound #1 Right Calcaneus: Gauze and Kerlix/Conform Dressing Change Frequency: [name], [name] ([id number]) Wound #1 Right Calcaneus: Change dressing every day. - When Home Health not visiting Three times weekly - For Home Health Specifics Follow-up  Appointments: Wound #1 Right Calcaneus: Return Appointment in 1 week. Off-Loading: Wound #1 Right Calcaneus: Heel suspension boot to: - [name] Additional Orders / Instructions: Wound #1 Right Calcaneus: Increase protein intake. Activity as tolerated Other: - Include these vitamins in your diet, MVI, ZINC, Vit C, Vit A Home Health: Wound #1 Right Calcaneus: [facility] Nurse may visit PRN to address patient s wound care  needs. FACE TO FACE ENCOUNTER: MEDICARE and MEDICAID PATIENTS: I certify that this patient is under my care and that I had a face-to-face encounter that meets the physician face-to-face encounter requirements with this patient on this date. The encounter with the patient was in whole or in part for the following MEDICAL CONDITION: (primary reason for Home Healthcare) MEDICAL NECESSITY: I certify, that based on my findings, NURSING services are a medically necessary home health service. HOME BOUND STATUS: I certify that my clinical findings support that this patient is homebound (i.[name]., Due to illness or injury, pt requires aid of supportive devices such as crutches, cane, wheelchairs, walkers, the use of special transportation or the assistance of another person to leave their place of residence. There is a normal inability to leave the home and doing so requires considerable and taxing effort. Other absences are for medical reasons / religious services and are infrequent or of short duration when for other reasons). If current dressing causes regression in wound condition, may D/C ordered dressing product/s and apply Normal Saline Moist Dressing daily until next Wound Healing Center / Other MD appointment. Notify Wound Healing Center of regression in wound condition at [phone]. Please direct any NON-WOUND related issues/requests for orders to patient's Primary Care Physician I have recommended: 1. Santyl ointment locally to be applied daily. 2. Offloading has been discussed in great detail 3. Adequate protein, vitamin A, vitamin C and zinc 4. regular visits to the wound center Electronic Signature(s) Signed: 10/19/2015 4:15:41 PM By: Evlyn KannerBritto, Monai Hindes MD, FACS Previous Signature: 10/19/2015 10:41:32 AM Version By: Evlyn KannerBritto, Salomon Ganser MD, FACS Entered By: Evlyn KannerBritto, Dagmar Adcox on 10/19/2015 16:15:41 Colleen QualeISLEY, [name] ([id number]) Colleen QualeISLEY, Emina L.  ([id number]) -------------------------------------------------------------------------------- SuperBill Details Patient Name: Colleen QualeISLEY, Rebekka L. Date of Service: 10/19/2015 Medical Record Number: [id number] Patient Account Number: [id number] Date of Birth/Sex: 06/27/1924 (82 y.o. Female) Treating RN: Curtis Sitesorthy, Joanna Primary Care Physician: Rolm GalaGrandis, Heidi Other Clinician: Referring Physician: Rolm GalaGrandis, Heidi Treating Physician/Extender: Rudene ReBritto, Deaveon Schoen Weeks in Treatment: 5 Diagnosis Coding ICD-10 Codes Code Description [phone] Pressure ulcer of right heel, stage 3 E44.1 Mild protein-calorie malnutrition Facility Procedures CPT4 Code: [id number] Description: 11042 - DEB SUBQ TISSUE 20 SQ CM/< ICD-10 Description Diagnosis L89.613 Pressure ulcer of right heel, stage 3 E44.1 Mild protein-calorie malnutrition Modifier: Quantity: 1 Physician Procedures CPT4 Code: [id number] Description: 11042 - WC PHYS SUBQ TISS 20 SQ CM ICD-10 Description Diagnosis L89.613 Pressure ulcer of right heel, stage 3 E44.1 Mild protein-calorie malnutrition Modifier: Quantity: 1 Electronic Signature(s) Signed: 10/19/2015 10:41:40 AM By: Evlyn KannerBritto, Rayne Loiseau MD, FACS Entered By: Evlyn KannerBritto, Taj Arteaga on 10/19/2015 10:41:40

## 2015-10-26 ENCOUNTER — Encounter: Payer: Medicare Other | Admitting: Surgery

## 2015-10-26 DIAGNOSIS — L89613 Pressure ulcer of right heel, stage 3: Secondary | ICD-10-CM | POA: Diagnosis not present

## 2015-10-27 NOTE — Progress Notes (Signed)
ARTESHA, [name] ([id number]) Visit Report for 10/26/2015 Arrival Information Details Patient Name: [name], [name] Date of Service: 10/26/2015 10:00 AM Medical Record Number: [id number] Patient Account Number: [id number] Date of Birth/Sex: 19-Apr-1924 (82 y.o. Female) Treating RN: Huel Coventry Primary Care Physician: Rolm Gala Other Clinician: Referring Physician: Rolm Gala Treating Physician/Extender: Rudene Re in Treatment: 6 Visit Information History Since Last Visit Added or deleted any medications: No Patient Arrived: Ambulatory Any new allergies or adverse reactions: No Arrival Time: 10:11 Had a fall or experienced change in No Accompanied By: daughter activities of daily living that may affect Transfer Assistance: None risk of falls: Patient Identification Verified: Yes Signs or symptoms of abuse/neglect since last No Secondary Verification Process Yes visito Completed: Hospitalized since last visit: No Patient Requires Transmission- No Has Dressing in Place as Prescribed: Yes Based Precautions: Pain Present Now: No Patient Has Alerts: Yes Patient Alerts: Patient on Blood Thinner eliquis Electronic Signature(s) Signed: 10/27/2015 10:50:50 AM By: Elliot Gurney, RN, BSN, Kim RN, BSN Entered By: Elliot Gurney, RN, BSN, Kim on 10/26/2015 10:12:19 [name] ([id number]) -------------------------------------------------------------------------------- Encounter Discharge Information Details Patient Name: [name] Date of Service: 10/26/2015 10:00 AM Medical Record Number: [id number] Patient Account Number: [id number] Date of Birth/Sex: February 07, 1924 (82 y.o. Female) Treating RN: Huel Coventry Primary Care Physician: Rolm Gala Other Clinician: Referring Physician: Rolm Gala Treating Physician/Extender: Rudene Re in Treatment: 6 Encounter Discharge Information Items Discharge Pain Level: 0 Discharge Condition: Stable Ambulatory Status:  Wheelchair Discharge Destination: Home Transportation: Private Auto Accompanied By: daughter Schedule Follow-up Appointment: Yes Medication Reconciliation completed and provided to Patient/Care Yes Karion Cudd: Provided on Clinical Summary of Care: 10/26/2015 Form Type Recipient Paper Patient DI Electronic Signature(s) Signed: 10/26/2015 10:56:43 AM By: Gwenlyn Perking Entered By: Gwenlyn Perking on 10/26/2015 10:56:42 [name] ([id number]) -------------------------------------------------------------------------------- Lower Extremity Assessment Details Patient Name: [name] Date of Service: 10/26/2015 10:00 AM Medical Record Number: [id number] Patient Account Number: [id number] Date of Birth/Sex: 12/07/1924 (82 y.o. Female) Treating RN: Huel Coventry Primary Care Physician: Rolm Gala Other Clinician: Referring Physician: Rolm Gala Treating Physician/Extender: Rudene Re in Treatment: 6 Vascular Assessment Pulses: Posterior Tibial Dorsalis Pedis Palpable: [Right:Yes] Extremity colors, hair growth, and conditions: Extremity Color: [Right:Normal] Hair Growth on Extremity: [Right:Yes] Temperature of Extremity: [Right:Warm] Capillary Refill: [Right:< 3 seconds] Dependent Rubor: [Right:No] Blanched when Elevated: [Right:No] Lipodermatosclerosis: [Right:No] Toe Nail Assessment Left: Right: Thick: No Discolored: No Deformed: No Improper Length and Hygiene: Yes Electronic Signature(s) Signed: 10/27/2015 10:50:50 AM By: Elliot Gurney, RN, BSN, Kim RN, BSN Entered By: Elliot Gurney, RN, BSN, Kim on 10/26/2015 10:17:09 [name] ([id number]) -------------------------------------------------------------------------------- Multi Wound Chart Details Patient Name: [name] Date of Service: 10/26/2015 10:00 AM Medical Record Number: [id number] Patient Account Number: [id number] Date of Birth/Sex: 02-18-1924 (82 y.o. Female) Treating RN: Huel Coventry Primary  Care Physician: Rolm Gala Other Clinician: Referring Physician: Rolm Gala Treating Physician/Extender: Rudene Re in Treatment: 6 Vital Signs Height(in): 59 Pulse(bpm): 77 Weight(lbs): 108 Blood Pressure 132/72 (mmHg): Body Mass Index(BMI): 22 Temperature(F): 98.0 Respiratory Rate 16 (breaths/min): Photos: [N/A:N/A] Wound Location: Right Calcaneus N/A N/A Wounding Event: Pressure Injury N/A N/A Primary Etiology: Pressure Ulcer N/A N/A Comorbid History: Deep Vein Thrombosis, N/A N/A Hypertension, Dementia Date Acquired: 07/14/2015 N/A N/A Weeks of Treatment: 6 N/A N/A Wound Status: Open N/A N/A Measurements L x W x D 0.2x0.2x0.8 N/A N/A (cm) Area (cm) : 0.031 N/A N/A Volume (cm) : 0.025 N/A N/A % Reduction in Area: 99.40% N/A N/A % Reduction  in Volume: 97.70% N/A N/A Classification: Category/Stage III N/A N/A Exudate Amount: Large N/A N/A Exudate Type: Serous N/A N/A Exudate Color: amber N/A N/A Wound Margin: Flat and Intact N/A N/A Granulation Amount: Medium (34-66%) N/A N/A Granulation Quality: Red N/A N/A Necrotic Amount: Medium (34-66%) N/A N/A Necrotic Tissue: Eschar, Adherent Slough N/A N/A Exposed Structures: N/A N/A [name], [name] ([id number]) Fascia: No Fat: No Tendon: No Muscle: No Joint: No Bone: No Limited to Skin Breakdown Epithelialization: None N/A N/A Periwound Skin Texture: Edema: No N/A N/A Excoriation: No Induration: No Callus: No Crepitus: No Fluctuance: No Friable: No Rash: No Scarring: No Periwound Skin Moist: Yes N/A N/A Moisture: Maceration: No Dry/Scaly: No Periwound Skin Color: Erythema: Yes N/A N/A Atrophie Blanche: No Cyanosis: No Ecchymosis: No Hemosiderin Staining: No Mottled: No Pallor: No Rubor: No Erythema Location: Circumferential N/A N/A Temperature: No Abnormality N/A N/A Tenderness on Yes N/A N/A Palpation: Wound Preparation: Ulcer Cleansing: N/A N/A Rinsed/Irrigated  with Saline Topical Anesthetic Applied: Other: lidocaine 4% Treatment Notes Electronic Signature(s) Signed: 10/27/2015 10:50:50 AM By: Elliot Gurney, RN, BSN, Kim RN, BSN Entered By: Elliot Gurney, RN, BSN, Kim on 10/26/2015 10:20:32 [name] ([id number]) -------------------------------------------------------------------------------- Multi-Disciplinary Care Plan Details Patient Name: [name] Date of Service: 10/26/2015 10:00 AM Medical Record Number: [id number] Patient Account Number: [id number] Date of Birth/Sex: 12-16-1924 (82 y.o. Female) Treating RN: Huel Coventry Primary Care Physician: Rolm Gala Other Clinician: Referring Physician: Rolm Gala Treating Physician/Extender: Rudene Re in Treatment: 6 Active Inactive Abuse / Safety / Falls / Self Care Management Nursing Diagnoses: Impaired home maintenance Impaired physical mobility Potential for falls Self care deficit: actual or potential Goals: Patient will remain injury free Date Initiated: 09/14/2015 Goal Status: Active Patient/caregiver will verbalize understanding of skin care regimen Date Initiated: 09/14/2015 Goal Status: Active Patient/caregiver will verbalize/demonstrate measure taken to improve self care Date Initiated: 09/14/2015 Goal Status: Active Patient/caregiver will verbalize/demonstrate measures taken to improve the patient's personal safety Date Initiated: 09/14/2015 Goal Status: Active Patient/caregiver will verbalize/demonstrate measures taken to prevent injury and/or falls Date Initiated: 09/14/2015 Goal Status: Active Patient/caregiver will verbalize/demonstrate understanding of what to do in case of emergency Date Initiated: 09/14/2015 Goal Status: Active Interventions: Assess fall risk on admission and as needed Assess: immobility, friction, shearing, incontinence upon admission and as needed Assess impairment of mobility on admission and as needed per policy Assess self care  needs on admission and as needed Provide education on basic hygiene Provide education on personal and home safety [name], [name] ([id number]) Provide education on safe transfers Notes: Orientation to the Wound Care Program Nursing Diagnoses: Knowledge deficit related to the wound healing center program Goals: Patient/caregiver will verbalize understanding of the Wound Healing Center Program Date Initiated: 09/14/2015 Goal Status: Active Interventions: Provide education on orientation to the wound center Notes: Pressure Nursing Diagnoses: Knowledge deficit related to causes and risk factors for pressure ulcer development Knowledge deficit related to management of pressures ulcers Potential for impaired tissue integrity related to pressure, friction, moisture, and shear Goals: Patient will remain free from development of additional pressure ulcers Date Initiated: 09/14/2015 Goal Status: Active Patient will remain free of pressure ulcers Date Initiated: 09/14/2015 Goal Status: Active Patient/caregiver will verbalize risk factors for pressure ulcer development Date Initiated: 09/14/2015 Goal Status: Active Patient/caregiver will verbalize understanding of pressure ulcer management Date Initiated: 09/14/2015 Goal Status: Active Interventions: Assess: immobility, friction, shearing, incontinence upon admission and as needed Assess offloading mechanisms upon admission and as needed Assess potential for pressure ulcer upon  admission and as needed Provide education on pressure ulcers Treatment Activities: Colleen QualeSLEY, Sharnetta L. ([id number]) Patient referred for pressure reduction/relief devices : 09/14/2015 Patient referred for seating evaluation to ensure proper offloading : 09/14/2015 Pressure reduction/relief device ordered : 09/14/2015 Notes: Wound/Skin Impairment Nursing Diagnoses: Impaired tissue integrity Knowledge deficit related to ulceration/compromised skin  integrity Goals: Patient/caregiver will verbalize understanding of skin care regimen Date Initiated: 09/14/2015 Goal Status: Active Ulcer/skin breakdown will have a volume reduction of 30% by week 4 Date Initiated: 09/14/2015 Goal Status: Active Ulcer/skin breakdown will have a volume reduction of 50% by week 8 Date Initiated: 09/14/2015 Goal Status: Active Ulcer/skin breakdown will have a volume reduction of 80% by week 12 Date Initiated: 09/14/2015 Goal Status: Active Ulcer/skin breakdown will heal within 14 weeks Date Initiated: 09/14/2015 Goal Status: Active Interventions: Assess patient/caregiver ability to obtain necessary supplies Assess patient/caregiver ability to perform ulcer/skin care regimen upon admission and as needed Assess ulceration(s) every visit Provide education on ulcer and skin care Treatment Activities: Referred to DME Sosha Shepherd for dressing supplies : 09/14/2015 Skin care regimen initiated : 09/14/2015 Topical wound management initiated : 09/14/2015 Notes: Electronic Signature(s) Signed: 10/27/2015 10:50:50 AM By: Elliot GurneyWoody, RN, BSN, Kim RN, BSN Entered By: Elliot GurneyWoody, RN, BSN, Kim on 10/26/2015 10:20:19 [name], Audrinna L. ([id number] Mallick) Chavez, Colleen GianottiROTHY L. ([id number]) -------------------------------------------------------------------------------- Pain Assessment Details Patient Name: [name], [name] Date of Service: 10/26/2015 10:00 AM Medical Record Number: [id number] Patient Account Number: [id number] Date of Birth/Sex: 02/27/1924 (82 y.o. Female) Treating RN: Huel CoventryWoody, Kim Primary Care Physician: Rolm GalaGrandis, Heidi Other Clinician: Referring Physician: Rolm GalaGrandis, Heidi Treating Physician/Extender: Rudene ReBritto, Errol Weeks in Treatment: 6 Active Problems Location of Pain Severity and Description of Pain Patient Has Paino Yes Site Locations Pain Location: Pain in Ulcers With Dressing Change: Yes Duration of the Pain. Constant / Intermittento Intermittent Rate the  pain. Current Pain Level: 3 Worst Pain Level: 10 Character of Pain Describe the Pain: Shooting, Throbbing Pain Management and Medication Current Pain Management: Medication: Yes Notes Topical or injectable lidocaine is offered to patient for acute pain when surgical debridement is performed. If needed, Patient is instructed to use over the counter pain medication for the following 24-48 hours after debridement. Wound care MDs do not prescribed pain medications. Patient has chronic pain or uncontrolled pain. Patient has been instructed to make an appointment with their Primary Care Physician for pain management. Electronic Signature(s) Signed: 10/27/2015 10:50:50 AM By: Elliot GurneyWoody, RN, BSN, Kim RN, BSN Entered By: Elliot GurneyWoody, RN, BSN, Kim on 10/26/2015 10:13:31 [name], Kalijah L. ([id number]) -------------------------------------------------------------------------------- Patient/Caregiver Education Details Patient Name: [name], [name] Date of Service: 10/26/2015 10:00 AM Medical Record Number: [id number] Patient Account Number: [id number] Date of Birth/Gender: 05/21/1924 (82 y.o. Female) Treating RN: Huel CoventryWoody, Kim Primary Care Physician: Rolm GalaGrandis, Heidi Other Clinician: Referring Physician: Rolm GalaGrandis, Heidi Treating Physician/Extender: Rudene ReBritto, Errol Weeks in Treatment: 6 Education Assessment Education Provided To: Patient Education Topics Provided Wound/Skin Impairment: Handouts: Caring for Your Ulcer, Other: continue wound care as prescribed Methods: Demonstration, Explain/Verbal Responses: State content correctly Electronic Signature(s) Signed: 10/27/2015 10:50:50 AM By: Elliot GurneyWoody, RN, BSN, Kim RN, BSN Entered By: Elliot GurneyWoody, RN, BSN, Kim on 10/26/2015 10:49:47 [name], Rayhana L. ([id number]) -------------------------------------------------------------------------------- Wound Assessment Details Patient Name: [name], [name] Date of Service: 10/26/2015 10:00 AM Medical Record Number:  [id number] Patient Account Number: [id number] Date of Birth/Sex: 08/28/1924 (82 y.o. Female) Treating RN: Huel CoventryWoody, Kim Primary Care Physician: Rolm GalaGrandis, Heidi Other Clinician: Referring Physician: Rolm GalaGrandis, Heidi Treating Physician/Extender: Rudene ReBritto, Errol Weeks in Treatment: 6 Wound Status Wound Number: 1 Primary  Pressure Ulcer Etiology: Wound Location: Right Calcaneus Wound Status: Open Wounding Event: Pressure Injury Comorbid Deep Vein Thrombosis, Date Acquired: 07/14/2015 History: Hypertension, Dementia Weeks Of Treatment: 6 Clustered Wound: No Photos Wound Measurements Length: (cm) 2 Width: (cm) 2 Depth: (cm) 0.8 Area: (cm) 3.142 Volume: (cm) 2.513 % Reduction in Area: 43% % Reduction in Volume: -127.8% Epithelialization: None Wound Description Classification: Category/Stage III Wound Margin: Flat and Intact Exudate Amount: Large Exudate Type: Serous Exudate Color: amber Foul Odor After Cleansing: No Wound Bed Granulation Amount: Medium (34-66%) Exposed Structure Granulation Quality: Red Fascia Exposed: No Necrotic Amount: Medium (34-66%) Fat Layer Exposed: No Necrotic Quality: Eschar, Adherent Slough Tendon Exposed: No Muscle Exposed: No Joint Exposed: No Bone Exposed: No Nin, [name] ([id number]) Limited to Skin Breakdown Periwound Skin Texture Texture Color No Abnormalities Noted: No No Abnormalities Noted: No Callus: No Atrophie Blanche: No Crepitus: No Cyanosis: No Excoriation: No Ecchymosis: No Fluctuance: No Erythema: Yes Friable: No Erythema Location: Circumferential Induration: No Hemosiderin Staining: No Localized Edema: No Mottled: No Rash: No Pallor: No Scarring: No Rubor: No Moisture Temperature / Pain No Abnormalities Noted: No Temperature: No Abnormality Dry / Scaly: No Tenderness on Palpation: Yes Maceration: No Moist: Yes Wound Preparation Ulcer Cleansing: Rinsed/Irrigated with Saline Topical Anesthetic  Applied: Other: lidocaine 4%, Treatment Notes Wound #1 (Right Calcaneus) 1. Cleansed with: Clean wound with Normal Saline 2. Anesthetic Topical Lidocaine 4% cream to wound bed prior to debridement 4. Dressing Applied: Prisma Ag 5. Secondary Dressing Applied Bordered Foam Dressing Notes netting Electronic Signature(s) Signed: 10/27/2015 10:50:50 AM By: Elliot Gurney, RN, BSN, Kim RN, BSN Entered By: Elliot Gurney, RN, BSN, Kim on 10/26/2015 11:17:01 [name] ([id number]) -------------------------------------------------------------------------------- Vitals Details Patient Name: [name] Date of Service: 10/26/2015 10:00 AM Medical Record Number: [id number] Patient Account Number: [id number] Date of Birth/Sex: [birth year] (82 y.o. Female) Treating RN: Huel Coventry Primary Care Physician: Rolm Gala Other Clinician: Referring Physician: Rolm Gala Treating Physician/Extender: Rudene Re in Treatment: 6 Vital Signs Time Taken: 10:13 Temperature (F): 98.0 Height (in): 59 Pulse (bpm): 77 Weight (lbs): 108 Respiratory Rate (breaths/min): 16 Body Mass Index (BMI): 21.8 Blood Pressure (mmHg): 132/72 Reference Range: 80 - 120 mg / dl Electronic Signature(s) Signed: 10/27/2015 10:50:50 AM By: Elliot Gurney, RN, BSN, Kim RN, BSN Entered By: Elliot Gurney, RN, BSN, Kim on 10/26/2015 10:13:53

## 2015-11-02 ENCOUNTER — Encounter: Payer: Medicare Other | Admitting: Surgery

## 2015-11-02 DIAGNOSIS — L89613 Pressure ulcer of right heel, stage 3: Secondary | ICD-10-CM | POA: Diagnosis not present

## 2015-11-03 NOTE — Progress Notes (Signed)
WESLYNN, KE ([id number]) Visit Report for 11/02/2015 Chief Complaint Document Details Patient Name: [name], [name] Date of Service: 11/02/2015 10:00 AM Medical Record Number: [id number] Patient Account Number: [id number] Date of Birth/Sex: Aug 19, 1924 (82 y.o. Female) Treating RN: Curtis Sites Primary Care Physician: Rolm Gala Other Clinician: Referring Physician: Rolm Gala Treating Physician/Extender: Rudene Re in Treatment: 7 Information Obtained from: Patient Chief Complaint Patient is at the clinic for treatment of an open pressure ulcer right heel which she's had for about 8 weeks Electronic Signature(s) Signed: 11/02/2015 10:37:49 AM By: Evlyn Kanner MD, FACS Entered By: Evlyn Kanner on 11/02/2015 10:37:49 [name] ([id number]) -------------------------------------------------------------------------------- Debridement Details Patient Name: [name] Date of Service: 11/02/2015 10:00 AM Medical Record Number: [id number] Patient Account Number: [id number] Date of Birth/Sex: 10-16-1924 (82 y.o. Female) Treating RN: Curtis Sites Primary Care Physician: Rolm Gala Other Clinician: Referring Physician: Rolm Gala Treating Physician/Extender: Rudene Re in Treatment: 7 Debridement Performed for Wound #1 Right Calcaneus Assessment: Performed By: Physician Evlyn Kanner, MD Debridement: Debridement Pre-procedure Yes - 10:33 Verification/Time Out Taken: Start Time: 10:33 Pain Control: Lidocaine 4% Topical Solution Level: Skin/Subcutaneous Tissue Total Area Debrided (L x 1.9 (cm) x 1.9 (cm) = 3.61 (cm) W): Tissue and other Non-Viable, Fibrin/Slough material debrided: Instrument: Curette Bleeding: Minimum Hemostasis Achieved: Pressure End Time: 10:38 Procedural Pain: 3 Post Procedural Pain: 3 Response to Treatment: Procedure was tolerated well Post Debridement Measurements of Total Wound Length: (cm)  1.9 Stage: Category/Stage III Width: (cm) 1.9 Depth: (cm) 0.5 Volume: (cm) 1.418 Character of Wound/Ulcer Post Requires Further Debridement: Debridement Severity of Tissue Post Fat layer exposed Debridement: Post Procedure Diagnosis Same as Pre-procedure Electronic Signature(s) Signed: 11/02/2015 10:37:40 AM By: Evlyn Kanner MD, FACS Signed: 11/02/2015 4:40:32 PM By: Curtis Sites Entered By: Evlyn Kanner on 11/02/2015 10:37:40 [name] ([id number] Mallick, Theora Gianotti ([id number]) -------------------------------------------------------------------------------- HPI Details Patient Name: [name] Date of Service: 11/02/2015 10:00 AM Medical Record Number: [id number] Patient Account Number: [id number] Date of Birth/Sex: 1924/02/07 (82 y.o. Female) Treating RN: Curtis Sites Primary Care Physician: Rolm Gala Other Clinician: Referring Physician: Rolm Gala Treating Physician/Extender: Rudene Re in Treatment: 7 History of Present Illness Location: right heel Quality: Patient reports experiencing a dull pain to affected area(s). Severity: Patient states wound are getting worse. Duration: Patient has had the wound for < 8 weeks prior to presenting for treatment Timing: Pain in wound is Intermittent (comes and goes Context: The wound occurred when the patient the hip fracture and was in traction for a few days while in hospital Modifying Factors: Other treatment(s) tried include:local care symptomatic treatment Associated Signs and Symptoms: Patient reports having increase discharge. HPI Description: 82 year old patient has been referred to as by her PCP for a pressure ulcer of the right heel. his had this for about 8 weeks Has been treated with duoderm. Has been there for a while it's getting more painful and larger and she also was seen for a recent urinary tract infection. She was given ciprofloxacin for this and then changed on to  nitrofurantoin. She has a past medical history of rheumatic fever, hyperlipidemia, hypertension, scoliosis and osteopenia. never been a smoker. 10/05/2015 -- x-ray of the right heel -- IMPRESSION:No osseous finding of significance. Abnormal appearance of the soft tissues posterior to the heel which could be due to infection or trauma. I do not see an intact Achilles tendon shadow. Electronic Signature(s) Signed: 11/02/2015 10:38:00 AM By: Evlyn Kanner MD, FACS Entered By: Evlyn Kanner on  11/02/2015 10:38:00 [name], [name] ([id number]) -------------------------------------------------------------------------------- Physical Exam Details Patient Name: [name], [name] Date of Service: 11/02/2015 10:00 AM Medical Record Number: [id number] Patient Account Number: [id number] Date of Birth/Sex: 03/30/1924 (82 y.o. Female) Treating RN: Curtis Sitesorthy, Joanna Primary Care Physician: Rolm GalaGrandis, Heidi Other Clinician: Referring Physician: Rolm GalaGrandis, Heidi Treating Physician/Extender: Rudene ReBritto, Ahnya Akre Weeks in Treatment: 7 Constitutional . Pulse regular. Respirations normal and unlabored. Afebrile. . Eyes Nonicteric. Reactive to light. Ears, Nose, Mouth, and Throat Lips, teeth, and gums WNL.Marland Kitchen. Moist mucosa without lesions. Neck supple and nontender. No palpable supraclavicular or cervical adenopathy. Normal sized without goiter. Respiratory WNL. No retractions.. Breath sounds WNL, No rubs, rales, rhonchi, or wheeze.. Cardiovascular Heart rhythm and rate regular, no murmur or gallop.. Pedal Pulses WNL. No clubbing, cyanosis or edema. Lymphatic No adneopathy. No adenopathy. No adenopathy. Musculoskeletal Adexa without tenderness or enlargement.. Digits and nails w/o clubbing, cyanosis, infection, petechiae, ischemia, or inflammatory conditions.. Integumentary (Hair, Skin) No suspicious lesions. No crepitus or fluctuance. No peri-wound warmth or erythema. No masses.Marland Kitchen. Psychiatric Judgement and insight  Intact.. No evidence of depression, anxiety, or agitation.. Notes overall there is much improvement in the wound and minimal debris in the subcutaneous area was removed sharply with a #3 curet and there is healthy granulation tissue. Electronic Signature(s) Signed: 11/02/2015 10:38:29 AM By: Evlyn KannerBritto, Pierson Vantol MD, FACS Entered By: Evlyn KannerBritto, Sahiti Joswick on 11/02/2015 10:38:29 [name], [name] ([id number]) -------------------------------------------------------------------------------- Physician Orders Details Patient Name: [name], [name] Date of Service: 11/02/2015 10:00 AM Medical Record Number: [id number] Patient Account Number: [id number] Date of Birth/Sex: 06/02/1924 (82 y.o. Female) Treating RN: Afful, RN, BSN, [place] Sinkita Primary Care Physician: Rolm GalaGrandis, Heidi Other Clinician: Referring Physician: Rolm GalaGrandis, Heidi Treating Physician/Extender: Rudene ReBritto, Dejia Ebron Weeks in Treatment: 7 Verbal / Phone Orders: Yes Clinician: Afful, RN, BSN, Rita Read Back and Verified: Yes Diagnosis Coding Wound Cleansing Wound #1 Right Calcaneus o Cleanse wound with mild soap and water o May Shower, gently pat wound dry prior to applying new dressing. o May shower with protection. Anesthetic Wound #1 Right Calcaneus o Topical Lidocaine 4% cream applied to wound bed prior to debridement - In Clinic Primary Wound Dressing Wound #1 Right Calcaneus o Prisma Ag Secondary Dressing Wound #1 Right Calcaneus o Boardered Foam Dressing Dressing Change Frequency Wound #1 Right Calcaneus o Three times weekly - For Home Health Specifics Follow-up Appointments Wound #1 Right Calcaneus o Return Appointment in 1 week. Off-Loading Wound #1 Right Calcaneus o Heel suspension boot to: - [employer] Additional Orders / Instructions Wound #1 Right Calcaneus o Increase protein intake. o Activity as tolerated o Other: - Include these vitamins in your diet, MVI, ZINC, Vit C, Vit A Felicetti, Jamell L.  ([id number]) Home Health Wound #1 Right Calcaneus o Continue Home Health Visits - [facility] o Home Health Nurse may visit PRN to address patientos wound care needs. o FACE TO FACE ENCOUNTER: MEDICARE and MEDICAID PATIENTS: I certify that this patient is under my care and that I had a face-to-face encounter that meets the physician face-to-face encounter requirements with this patient on this date. The encounter with the patient was in whole or in part for the following MEDICAL CONDITION: (primary reason for Home Healthcare) MEDICAL NECESSITY: I certify, that based on my findings, NURSING services are a medically necessary home health service. HOME BOUND STATUS: I certify that my clinical findings support that this patient is homebound (i.e., Due to illness or injury, pt requires aid of supportive devices such as crutches, cane, wheelchairs, walkers, the use of special transportation  or the assistance of another person to leave their place of residence. There is a normal inability to leave the home and doing so requires considerable and taxing effort. Other absences are for medical reasons / religious services and are infrequent or of short duration when for other reasons). o If current dressing causes regression in wound condition, may D/C ordered dressing product/s and apply Normal Saline Moist Dressing daily until next Wound Healing Center / Other MD appointment. Notify Wound Healing Center of regression in wound condition at [phone]. o Please direct any NON-WOUND related issues/requests for orders to patient's Primary Care Physician Electronic Signature(s) Signed: 11/02/2015 4:25:16 PM By: Evlyn Kanner MD, FACS Signed: 11/02/2015 5:32:18 PM By: Elpidio Eric BSN, RN Entered By: Elpidio Eric on 11/02/2015 10:35:48 [name] ([id number]) -------------------------------------------------------------------------------- Problem List Details Patient Name: [name] Date of Service: 11/02/2015 10:00 AM Medical Record Number: [id number] Patient Account Number: [id number] Date of Birth/Sex: October 19, 1924 (82 y.o. Female) Treating RN: Curtis Sites Primary Care Physician: Rolm Gala Other Clinician: Referring Physician: Rolm Gala Treating Physician/Extender: Rudene Re in Treatment: 7 Active Problems ICD-10 Encounter Code Description Active Date Diagnosis L89.613 Pressure ulcer of right heel, stage 3 09/14/2015 Yes E44.1 Mild protein-calorie malnutrition 09/14/2015 Yes Inactive Problems Resolved Problems Electronic Signature(s) Signed: 11/02/2015 10:37:29 AM By: Evlyn Kanner MD, FACS Entered By: Evlyn Kanner on 11/02/2015 10:37:28 [name] ([id number]) -------------------------------------------------------------------------------- Progress Note Details Patient Name: [name] Date of Service: 11/02/2015 10:00 AM Medical Record Number: [id number] Patient Account Number: [id number] Date of Birth/Sex: Mar 08, 1924 (82 y.o. Female) Treating RN: Curtis Sites Primary Care Physician: Rolm Gala Other Clinician: Referring Physician: Rolm Gala Treating Physician/Extender: Rudene Re in Treatment: 7 Subjective Chief Complaint Information obtained from Patient Patient is at the clinic for treatment of an open pressure ulcer right heel which she's had for about 8 weeks History of Present Illness (HPI) The following HPI elements were documented for the patient's wound: Location: right heel Quality: Patient reports experiencing a dull pain to affected area(s). Severity: Patient states wound are getting worse. Duration: Patient has had the wound for < 8 weeks prior to presenting for treatment Timing: Pain in wound is Intermittent (comes and goes Context: The wound occurred when the patient the hip fracture and was in traction for a few days while in hospital Modifying Factors: Other treatment(s) tried  include:local care symptomatic treatment Associated Signs and Symptoms: Patient reports having increase discharge. 82 year old patient has been referred to as by her PCP for a pressure ulcer of the right heel. his had this for about 8 weeks Has been treated with duoderm. Has been there for a while it's getting more painful and larger and she also was seen for a recent urinary tract infection. She was given ciprofloxacin for this and then changed on to nitrofurantoin. She has a past medical history of rheumatic fever, hyperlipidemia, hypertension, scoliosis and osteopenia. never been a smoker. 10/05/2015 -- x-ray of the right heel -- IMPRESSION:No osseous finding of significance. Abnormal appearance of the soft tissues posterior to the heel which could be due to infection or trauma. I do not see an intact Achilles tendon shadow. Objective Constitutional Pulse regular. Respirations normal and unlabored. Afebrile. Vitals Time Taken: 10:15 AM, Height: 59 in, Weight: 108 lbs, BMI: 21.8, Temperature: 98.1 F, Pulse: 83 [name], [name] ([id number]) bpm, Respiratory Rate: 18 breaths/min, Blood Pressure: 124/81 mmHg. Eyes Nonicteric. Reactive to light. Ears, Nose, Mouth, and Throat Lips, teeth, and gums WNL.Marland Kitchen Moist mucosa  without lesions. Neck supple and nontender. No palpable supraclavicular or cervical adenopathy. Normal sized without goiter. Respiratory WNL. No retractions.. Breath sounds WNL, No rubs, rales, rhonchi, or wheeze.. Cardiovascular Heart rhythm and rate regular, no murmur or gallop.. Pedal Pulses WNL. No clubbing, cyanosis or edema. Lymphatic No adneopathy. No adenopathy. No adenopathy. Musculoskeletal Adexa without tenderness or enlargement.. Digits and nails w/o clubbing, cyanosis, infection, petechiae, ischemia, or inflammatory conditions.Marland Kitchen Psychiatric Judgement and insight Intact.. No evidence of depression, anxiety, or agitation.. General Notes: overall there is much  improvement in the wound and minimal debris in the subcutaneous area was removed sharply with a #3 curet and there is healthy granulation tissue. Integumentary (Hair, Skin) No suspicious lesions. No crepitus or fluctuance. No peri-wound warmth or erythema. No masses.. Wound #1 status is Open. Original cause of wound was Pressure Injury. The wound is located on the Right Calcaneus. The wound measures 1.9cm length x 1.9cm width x 0.5cm depth; 2.835cm^2 area and 1.418cm^3 volume. The wound is limited to skin breakdown. There is no tunneling or undermining noted. There is a large amount of serous drainage noted. The wound margin is flat and intact. There is medium (34-66%) red granulation within the wound bed. There is a medium (34-66%) amount of necrotic tissue within the wound bed including Eschar and Adherent Slough. The periwound skin appearance exhibited: Moist, Erythema. The periwound skin appearance did not exhibit: Callus, Crepitus, Excoriation, Fluctuance, Friable, Induration, Localized Edema, Rash, Scarring, Dry/Scaly, Maceration, Atrophie Blanche, Cyanosis, Ecchymosis, Hemosiderin Staining, Mottled, Pallor, Rubor. The surrounding wound skin color is noted with erythema which is circumferential. Periwound temperature was noted as No Abnormality. The periwound has tenderness on palpation. Assessment [name], [name] ([id number]) Active Problems ICD-10 L89.613 - Pressure ulcer of right heel, stage 3 E44.1 - Mild protein-calorie malnutrition Procedures Wound #1 Wound #1 is a Pressure Ulcer located on the Right Calcaneus . There was a Skin/Subcutaneous Tissue Debridement ([id number]) debridement with total area of 3.61 sq cm performed by Evlyn Kanner, MD. with the following instrument(s): Curette to remove Non-Viable tissue/material including Fibrin/Slough after achieving pain control using Lidocaine 4% Topical Solution. A time out was conducted at 10:33, prior to the start of the  procedure. A Minimum amount of bleeding was controlled with Pressure. The procedure was tolerated well with a pain level of 3 throughout and a pain level of 3 following the procedure. Post Debridement Measurements: 1.9cm length x 1.9cm width x 0.5cm depth; 1.418cm^3 volume. Post debridement Stage noted as Category/Stage III. Character of Wound/Ulcer Post Debridement requires further debridement. Severity of Tissue Post Debridement is: Fat layer exposed. Post procedure Diagnosis Wound #1: Same as Pre-Procedure Plan Wound Cleansing: Wound #1 Right Calcaneus: Cleanse wound with mild soap and water May Shower, gently pat wound dry prior to applying new dressing. May shower with protection. Anesthetic: Wound #1 Right Calcaneus: Topical Lidocaine 4% cream applied to wound bed prior to debridement - In Clinic Primary Wound Dressing: Wound #1 Right Calcaneus: Prisma Ag Secondary Dressing: Wound #1 Right Calcaneus: Boardered Foam Dressing Dressing Change Frequency: Wound #1 Right Calcaneus: [name], [name] ([id number]) Three times weekly - For Home Health Specifics Follow-up Appointments: Wound #1 Right Calcaneus: Return Appointment in 1 week. Off-Loading: Wound #1 Right Calcaneus: Heel suspension boot to: - [employer] Additional Orders / Instructions: Wound #1 Right Calcaneus: Increase protein intake. Activity as tolerated Other: - Include these vitamins in your diet, MVI, ZINC, Vit C, Vit A Home Health: Wound #1 Right Calcaneus: Continue Home Health Visits -  [facility] Home Health Nurse may visit PRN to address patient s wound care needs. FACE TO FACE ENCOUNTER: MEDICARE and MEDICAID PATIENTS: I certify that this patient is under my care and that I had a face-to-face encounter that meets the physician face-to-face encounter requirements with this patient on this date. The encounter with the patient was in whole or in part for the following MEDICAL CONDITION: (primary reason for  Home Healthcare) MEDICAL NECESSITY: I certify, that based on my findings, NURSING services are a medically necessary home health service. HOME BOUND STATUS: I certify that my clinical findings support that this patient is homebound (i.e., Due to illness or injury, pt requires aid of supportive devices such as crutches, cane, wheelchairs, walkers, the use of special transportation or the assistance of another person to leave their place of residence. There is a normal inability to leave the home and doing so requires considerable and taxing effort. Other absences are for medical reasons / religious services and are infrequent or of short duration when for other reasons). If current dressing causes regression in wound condition, may D/C ordered dressing product/s and apply Normal Saline Moist Dressing daily until next Wound Healing Center / Other MD appointment. Notify Wound Healing Center of regression in wound condition at [phone]. Please direct any NON-WOUND related issues/requests for orders to patient's Primary Care Physician I have recommended: 1. Prisma Ag locally to be applied every other day. 2. Offloading has been discussed in great detail 3. Adequate protein, vitamin A, vitamin C and zinc 4. regular visits to the wound center Electronic Signature(s) Signed: 11/02/2015 10:38:55 AM By: Evlyn Kanner MD, FACS Entered By: Evlyn Kanner on 11/02/2015 10:38:55 [name] ([id number]) -------------------------------------------------------------------------------- SuperBill Details Patient Name: [name] Date of Service: 11/02/2015 Medical Record Number: [id number] Patient Account Number: [id number] Date of Birth/Sex: 18-Sep-1924 (82 y.o. Female) Treating RN: Curtis Sites Primary Care Physician: Rolm Gala Other Clinician: Referring Physician: Rolm Gala Treating Physician/Extender: Rudene Re in Treatment: 7 Diagnosis Coding ICD-10 Codes Code  Description [phone] Pressure ulcer of right heel, stage 3 E44.1 Mild protein-calorie malnutrition Facility Procedures CPT4 Code: [id number] Description: 11042 - DEB SUBQ TISSUE 20 SQ CM/< ICD-10 Description Diagnosis L89.613 Pressure ulcer of right heel, stage 3 E44.1 Mild protein-calorie malnutrition Modifier: Quantity: 1 Physician Procedures CPT4 Code: [id number] Description: 11042 - WC PHYS SUBQ TISS 20 SQ CM ICD-10 Description Diagnosis L89.613 Pressure ulcer of right heel, stage 3 E44.1 Mild protein-calorie malnutrition Modifier: Quantity: 1 Electronic Signature(s) Signed: 11/02/2015 10:39:06 AM By: Evlyn Kanner MD, FACS Entered By: Evlyn Kanner on 11/02/2015 10:39:06

## 2015-11-03 NOTE — Progress Notes (Signed)
[name] ([id number]) Visit Report for 11/02/2015 Arrival Information Details Patient Name: [name] Date of Service: 11/02/2015 10:00 AM Medical Record Number: [id number] Patient Account Number: [id number] Date of Birth/Sex: 08/09/1924 (82 y.o. Female) Treating RN: Curtis Sites Primary Care Physician: Rolm Gala Other Clinician: Referring Physician: Rolm Gala Treating Physician/Extender: Rudene Re in Treatment: 7 Visit Information History Since Last Visit Added or deleted any medications: No Patient Arrived: Wheel Chair Any new allergies or adverse reactions: No Arrival Time: 10:01 Had a fall or experienced change in No Accompanied By: caregiver activities of daily living that may affect Transfer Assistance: Manual risk of falls: Patient Identification Verified: Yes Signs or symptoms of abuse/neglect since last No Secondary Verification Process Yes visito Completed: Hospitalized since last visit: No Patient Requires Transmission- No Pain Present Now: No Based Precautions: Patient Has Alerts: Yes Patient Alerts: Patient on Blood Thinner eliquis Electronic Signature(s) Signed: 11/02/2015 4:40:32 PM By: Curtis Sites Entered By: Curtis Sites on 11/02/2015 10:15:21 [name] ([id number]) -------------------------------------------------------------------------------- Encounter Discharge Information Details Patient Name: [name] Date of Service: 11/02/2015 10:00 AM Medical Record Number: [id number] Patient Account Number: [id number] Date of Birth/Sex: 01-07-1925 (82 y.o. Female) Treating RN: Curtis Sites Primary Care Physician: Rolm Gala Other Clinician: Referring Physician: Rolm Gala Treating Physician/Extender: Rudene Re in Treatment: 7 Encounter Discharge Information Items Discharge Pain Level: 0 Discharge Condition: Stable Ambulatory Status: Wheelchair Discharge Destination:  Home Transportation: Private Auto Accompanied By: [name] Schedule Follow-up Appointment: Yes Medication Reconciliation completed and provided to Patient/Care No [name]: Provided on Clinical Summary of Care: 11/02/2015 Form Type Recipient Paper Patient DI Electronic Signature(s) Signed: 11/02/2015 11:16:34 AM By: Curtis Sites Previous Signature: 11/02/2015 10:44:34 AM Version By: Gwenlyn Perking Entered By: Curtis Sites on 11/02/2015 11:16:34 [name] ([id number]) -------------------------------------------------------------------------------- Lower Extremity Assessment Details Patient Name: [name] Date of Service: 11/02/2015 10:00 AM Medical Record Number: [id number] Patient Account Number: [id number] Date of Birth/Sex: July 14, 1924 (82 y.o. Female) Treating RN: Curtis Sites Primary Care Physician: Rolm Gala Other Clinician: Referring Physician: Rolm Gala Treating Physician/Extender: Rudene Re in Treatment: 7 Edema Assessment Assessed: [Left: No] [Right: No] Edema: [Left: N] [Right: o] Vascular Assessment Pulses: Posterior Tibial Dorsalis Pedis Palpable: [Right:Yes] Extremity colors, hair growth, and conditions: Extremity Color: [Right:Normal] Hair Growth on Extremity: [Right:Yes] Temperature of Extremity: [Right:Warm] Capillary Refill: [Right:< 3 seconds] Electronic Signature(s) Signed: 11/02/2015 4:40:32 PM By: Curtis Sites Entered By: Curtis Sites on 11/02/2015 10:22:32 [name] ([id number]) -------------------------------------------------------------------------------- Multi Wound Chart Details Patient Name: [name] Date of Service: 11/02/2015 10:00 AM Medical Record Number: [id number] Patient Account Number: [id number] Date of Birth/Sex: 10-Sep-1924 (82 y.o. Female) Treating RN: Clover Mealy, RN, BSN, [place] Primary Care Physician: Rolm Gala Other Clinician: Referring Physician: Rolm Gala Treating  Physician/Extender: Rudene Re in Treatment: 7 Vital Signs Height(in): 59 Pulse(bpm): 83 Weight(lbs): 108 Blood Pressure 124/81 (mmHg): Body Mass Index(BMI): 22 Temperature(F): 98.1 Respiratory Rate 18 (breaths/min): Photos: [N/A:N/A] Wound Location: Right Calcaneus N/A N/A Wounding Event: Pressure Injury N/A N/A Primary Etiology: Pressure Ulcer N/A N/A Comorbid History: Deep Vein Thrombosis, N/A N/A Hypertension, Dementia Date Acquired: 07/14/2015 N/A N/A Weeks of Treatment: 7 N/A N/A Wound Status: Open N/A N/A Measurements L x W x D 1.9x1.9x0.5 N/A N/A (cm) Area (cm) : 2.835 N/A N/A Volume (cm) : 1.418 N/A N/A % Reduction in Area: 48.60% N/A N/A % Reduction in Volume: -28.60% N/A N/A Classification: Category/Stage III N/A N/A Exudate Amount: Large N/A N/A Exudate Type: Serous N/A N/A  Exudate Color: amber N/A N/A Wound Margin: Flat and Intact N/A N/A Granulation Amount: Medium (34-66%) N/A N/A Granulation Quality: Red N/A N/A Necrotic Amount: Medium (34-66%) N/A N/A Chavez, [name] ([id number]) Necrotic Tissue: Eschar, Adherent Slough N/A N/A Exposed Structures: Fascia: No N/A N/A Fat: No Tendon: No Muscle: No Joint: No Bone: No Limited to Skin Breakdown Epithelialization: None N/A N/A Periwound Skin Texture: Edema: No N/A N/A Excoriation: No Induration: No Callus: No Crepitus: No Fluctuance: No Friable: No Rash: No Scarring: No Periwound Skin Moist: Yes N/A N/A Moisture: Maceration: No Dry/Scaly: No Periwound Skin Color: Erythema: Yes N/A N/A Atrophie Blanche: No Cyanosis: No Ecchymosis: No Hemosiderin Staining: No Mottled: No Pallor: No Rubor: No Erythema Location: Circumferential N/A N/A Temperature: No Abnormality N/A N/A Tenderness on Yes N/A N/A Palpation: Wound Preparation: Ulcer Cleansing: N/A N/A Rinsed/Irrigated with Saline Topical Anesthetic Applied: Other: lidocaine 4% Treatment Notes Electronic  Signature(s) Signed: 11/02/2015 5:32:18 PM By: Elpidio Eric BSN, RN Entered By: Elpidio Eric on 11/02/2015 10:34:18 [name] ([id number]) -------------------------------------------------------------------------------- Multi-Disciplinary Care Plan Details Patient Name: [name] Date of Service: 11/02/2015 10:00 AM Medical Record Number: [id number] Patient Account Number: [id number] Date of Birth/Sex: November 13, 1924 (82 y.o. Female) Treating RN: Afful, RN, BSN, [place] Primary Care Physician: Rolm Gala Other Clinician: Referring Physician: Rolm Gala Treating Physician/Extender: Rudene Re in Treatment: 7 Active Inactive Abuse / Safety / Falls / Self Care Management Nursing Diagnoses: Impaired home maintenance Impaired physical mobility Potential for falls Self care deficit: actual or potential Goals: Patient will remain injury free Date Initiated: 09/14/2015 Goal Status: Active Patient/caregiver will verbalize understanding of skin care regimen Date Initiated: 09/14/2015 Goal Status: Active Patient/caregiver will verbalize/demonstrate measure taken to improve self care Date Initiated: 09/14/2015 Goal Status: Active Patient/caregiver will verbalize/demonstrate measures taken to improve the patient's personal safety Date Initiated: 09/14/2015 Goal Status: Active Patient/caregiver will verbalize/demonstrate measures taken to prevent injury and/or falls Date Initiated: 09/14/2015 Goal Status: Active Patient/caregiver will verbalize/demonstrate understanding of what to do in case of emergency Date Initiated: 09/14/2015 Goal Status: Active Interventions: Assess fall risk on admission and as needed Assess: immobility, friction, shearing, incontinence upon admission and as needed Assess impairment of mobility on admission and as needed per policy Assess self care needs on admission and as needed Provide education on basic hygiene Provide education on personal  and home safety LATECIA, MILER ([id number]) Provide education on safe transfers Notes: Orientation to the Wound Care Program Nursing Diagnoses: Knowledge deficit related to the wound healing center program Goals: Patient/caregiver will verbalize understanding of the Wound Healing Center Program Date Initiated: 09/14/2015 Goal Status: Active Interventions: Provide education on orientation to the wound center Notes: Pressure Nursing Diagnoses: Knowledge deficit related to causes and risk factors for pressure ulcer development Knowledge deficit related to management of pressures ulcers Potential for impaired tissue integrity related to pressure, friction, moisture, and shear Goals: Patient will remain free from development of additional pressure ulcers Date Initiated: 09/14/2015 Goal Status: Active Patient will remain free of pressure ulcers Date Initiated: 09/14/2015 Goal Status: Active Patient/caregiver will verbalize risk factors for pressure ulcer development Date Initiated: 09/14/2015 Goal Status: Active Patient/caregiver will verbalize understanding of pressure ulcer management Date Initiated: 09/14/2015 Goal Status: Active Interventions: Assess: immobility, friction, shearing, incontinence upon admission and as needed Assess offloading mechanisms upon admission and as needed Assess potential for pressure ulcer upon admission and as needed Provide education on pressure ulcers Treatment Activities: TRISA, CRANOR ([id number]) Patient referred for pressure reduction/relief devices :  09/14/2015 Patient referred for seating evaluation to ensure proper offloading : 09/14/2015 Pressure reduction/relief device ordered : 09/14/2015 Notes: Wound/Skin Impairment Nursing Diagnoses: Impaired tissue integrity Knowledge deficit related to ulceration/compromised skin integrity Goals: Patient/caregiver will verbalize understanding of skin care regimen Date Initiated: 09/14/2015 Goal  Status: Active Ulcer/skin breakdown will have a volume reduction of 30% by week 4 Date Initiated: 09/14/2015 Goal Status: Active Ulcer/skin breakdown will have a volume reduction of 50% by week 8 Date Initiated: 09/14/2015 Goal Status: Active Ulcer/skin breakdown will have a volume reduction of 80% by week 12 Date Initiated: 09/14/2015 Goal Status: Active Ulcer/skin breakdown will heal within 14 weeks Date Initiated: 09/14/2015 Goal Status: Active Interventions: Assess patient/caregiver ability to obtain necessary supplies Assess patient/caregiver ability to perform ulcer/skin care regimen upon admission and as needed Assess ulceration(s) every visit Provide education on ulcer and skin care Treatment Activities: Referred to DME Marcellene Shivley for dressing supplies : 09/14/2015 Skin care regimen initiated : 09/14/2015 Topical wound management initiated : 09/14/2015 Notes: Electronic Signature(s) Signed: 11/02/2015 5:32:18 PM By: Elpidio Eric BSN, RN Entered By: Elpidio Eric on 11/02/2015 10:33:13 [name] ([id number] Mallick, Theora Gianotti ([id number]) -------------------------------------------------------------------------------- Pain Assessment Details Patient Name: [name] Date of Service: 11/02/2015 10:00 AM Medical Record Number: [id number] Patient Account Number: [id number] Date of Birth/Sex: 07/12/1924 (82 y.o. Female) Treating RN: Curtis Sites Primary Care Physician: Rolm Gala Other Clinician: Referring Physician: Rolm Gala Treating Physician/Extender: Rudene Re in Treatment: 7 Active Problems Location of Pain Severity and Description of Pain Patient Has Paino No Site Locations Pain Management and Medication Current Pain Management: Notes Topical or injectable lidocaine is offered to patient for acute pain when surgical debridement is performed. If needed, Patient is instructed to use over the counter pain medication for the following 24-48 hours  after debridement. Wound care MDs do not prescribed pain medications. Patient has chronic pain or uncontrolled pain. Patient has been instructed to make an appointment with their Primary Care Physician for pain management. Electronic Signature(s) Signed: 11/02/2015 4:40:32 PM By: Curtis Sites Entered By: Curtis Sites on 11/02/2015 10:15:48 [name] ([id number]) -------------------------------------------------------------------------------- Patient/Caregiver Education Details Patient Name: [name] Date of Service: 11/02/2015 10:00 AM Medical Record Number: [id number] Patient Account Number: [id number] Date of Birth/Gender: May 08, 1924 (82 y.o. Female) Treating RN: Curtis Sites Primary Care Physician: Rolm Gala Other Clinician: Referring Physician: Rolm Gala Treating Physician/Extender: Rudene Re in Treatment: 7 Education Assessment Education Provided To: Caregiver Education Topics Provided Wound/Skin Impairment: Handouts: Other: wound care as ordered Methods: Demonstration, Explain/Verbal Responses: State content correctly Electronic Signature(s) Signed: 11/02/2015 4:40:32 PM By: Curtis Sites Entered By: Curtis Sites on 11/02/2015 11:16:57 Debo, Garima Elbert Ewings ([id number]) -------------------------------------------------------------------------------- Wound Assessment Details Patient Name: [name] Date of Service: 11/02/2015 10:00 AM Medical Record Number: [id number] Patient Account Number: [id number] Date of Birth/Sex: 04-Jan-1925 (82 y.o. Female) Treating RN: Curtis Sites Primary Care Physician: Rolm Gala Other Clinician: Referring Physician: Rolm Gala Treating Physician/Extender: Rudene Re in Treatment: 7 Wound Status Wound Number: 1 Primary Pressure Ulcer Etiology: Wound Location: Right Calcaneus Wound Status: Open Wounding Event: Pressure Injury Comorbid Deep Vein Thrombosis, Date Acquired:  07/14/2015 History: Hypertension, Dementia Weeks Of Treatment: 7 Clustered Wound: No Photos Wound Measurements Length: (cm) 1.9 Width: (cm) 1.9 Depth: (cm) 0.5 Area: (cm) 2.835 Volume: (cm) 1.418 % Reduction in Area: 48.6% % Reduction in Volume: -28.6% Epithelialization: None Tunneling: No Undermining: No Wound Description Classification: Category/Stage III Wound Margin: Flat and Intact Exudate Amount: Large Exudate Type: Serous  Exudate Color: amber Foul Odor After Cleansing: No Wound Bed Granulation Amount: Medium (34-66%) Exposed Structure Granulation Quality: Red Fascia Exposed: No Necrotic Amount: Medium (34-66%) Fat Layer Exposed: No Necrotic Quality: Eschar, Adherent Slough Tendon Exposed: No Muscle Exposed: No Trudell, Calleigh L. ([id number]) Joint Exposed: No Bone Exposed: No Limited to Skin Breakdown Periwound Skin Texture Texture Color No Abnormalities Noted: No No Abnormalities Noted: No Callus: No Atrophie Blanche: No Crepitus: No Cyanosis: No Excoriation: No Ecchymosis: No Fluctuance: No Erythema: Yes Friable: No Erythema Location: Circumferential Induration: No Hemosiderin Staining: No Localized Edema: No Mottled: No Rash: No Pallor: No Scarring: No Rubor: No Moisture Temperature / Pain No Abnormalities Noted: No Temperature: No Abnormality Dry / Scaly: No Tenderness on Palpation: Yes Maceration: No Moist: Yes Wound Preparation Ulcer Cleansing: Rinsed/Irrigated with Saline Topical Anesthetic Applied: Other: lidocaine 4%, Treatment Notes Wound #1 (Right Calcaneus) 1. Cleansed with: Clean wound with Normal Saline 2. Anesthetic Topical Lidocaine 4% cream to wound bed prior to debridement 4. Dressing Applied: Prisma Ag 5. Secondary Dressing Applied Guaze, ABD and kerlix/Conform Notes netting Electronic Signature(s) Signed: 11/02/2015 4:40:32 PM By: Curtis Sitesorthy, Joanna Entered By: Curtis Sitesorthy, Joanna on 11/02/2015 10:26:25 Colleen QualeISLEY,  [name] ([id number]) -------------------------------------------------------------------------------- Vitals Details Patient Name: Colleen QualeISLEY, [name] Date of Service: 11/02/2015 10:00 AM Medical Record Number: [id number] Patient Account Number: [id number] Date of Birth/Sex: 12/23/1924 (82 y.o. Female) Treating RN: Curtis Sitesorthy, Joanna Primary Care Physician: Rolm GalaGrandis, Heidi Other Clinician: Referring Physician: Rolm GalaGrandis, Heidi Treating Physician/Extender: Rudene ReBritto, Errol Weeks in Treatment: 7 Vital Signs Time Taken: 10:15 Temperature (F): 98.1 Height (in): 59 Pulse (bpm): 83 Weight (lbs): 108 Respiratory Rate (breaths/min): 18 Body Mass Index (BMI): 21.8 Blood Pressure (mmHg): 124/81 Reference Range: 80 - 120 mg / dl Electronic Signature(s) Signed: 11/02/2015 4:40:32 PM By: Curtis Sitesorthy, Joanna Entered By: Curtis Sitesorthy, Joanna on 11/02/2015 10:16:07

## 2015-11-09 ENCOUNTER — Encounter: Payer: Medicare Other | Attending: General Surgery | Admitting: General Surgery

## 2015-11-09 DIAGNOSIS — Z7901 Long term (current) use of anticoagulants: Secondary | ICD-10-CM | POA: Diagnosis not present

## 2015-11-09 DIAGNOSIS — M419 Scoliosis, unspecified: Secondary | ICD-10-CM | POA: Diagnosis not present

## 2015-11-09 DIAGNOSIS — Z880 Allergy status to penicillin: Secondary | ICD-10-CM | POA: Diagnosis not present

## 2015-11-09 DIAGNOSIS — L89613 Pressure ulcer of right heel, stage 3: Secondary | ICD-10-CM

## 2015-11-09 DIAGNOSIS — M8580 Other specified disorders of bone density and structure, unspecified site: Secondary | ICD-10-CM | POA: Diagnosis not present

## 2015-11-09 DIAGNOSIS — E785 Hyperlipidemia, unspecified: Secondary | ICD-10-CM | POA: Diagnosis not present

## 2015-11-09 DIAGNOSIS — Z79899 Other long term (current) drug therapy: Secondary | ICD-10-CM | POA: Diagnosis not present

## 2015-11-09 DIAGNOSIS — E441 Mild protein-calorie malnutrition: Secondary | ICD-10-CM

## 2015-11-09 DIAGNOSIS — Z7982 Long term (current) use of aspirin: Secondary | ICD-10-CM | POA: Insufficient documentation

## 2015-11-09 DIAGNOSIS — Z86718 Personal history of other venous thrombosis and embolism: Secondary | ICD-10-CM | POA: Diagnosis not present

## 2015-11-09 DIAGNOSIS — F0390 Unspecified dementia without behavioral disturbance: Secondary | ICD-10-CM | POA: Diagnosis not present

## 2015-11-09 DIAGNOSIS — I10 Essential (primary) hypertension: Secondary | ICD-10-CM | POA: Diagnosis not present

## 2015-11-09 NOTE — Progress Notes (Signed)
See I heal note 

## 2015-11-10 NOTE — Progress Notes (Signed)
[name], [name] ([id number]) Visit Report for 11/09/2015 Chief Complaint Document Details Patient Name: [name], [name] Date of Service: 11/09/2015 2:15 PM Medical Record Number: [id number] Patient Account Number: [id number] Date of Birth/Sex: 10/26/1924 (82 y.o. Female) Treating RN: Curtis Sitesorthy, Joanna Primary Care Physician: Rolm GalaGrandis, Heidi Other Clinician: Referring Physician: Rolm GalaGrandis, Heidi Treating Physician/Extender: Elayne SnarePARKER, Frank Pilger Weeks in Treatment: 8 Information Obtained from: Patient Chief Complaint Patient is at the clinic for treatment of an open pressure ulcer right heel which she's had for about 8 weeks Electronic Signature(s) Signed: 11/09/2015 3:43:54 PM By: Ardath SaxParker, Marciano Mundt MD Entered By: Ardath SaxParker, Lander Eslick on 11/09/2015 15:43:54 [name], [name] ([id number]) -------------------------------------------------------------------------------- HPI Details Patient Name: [name], [name] Date of Service: 11/09/2015 2:15 PM Medical Record Number: [id number] Patient Account Number: [id number] Date of Birth/Sex: 04/08/1924 (82 y.o. Female) Treating RN: Curtis Sitesorthy, Joanna Primary Care Physician: Rolm GalaGrandis, Heidi Other Clinician: Referring Physician: Rolm GalaGrandis, Heidi Treating Physician/Extender: Elayne SnarePARKER, Tyri Elmore Weeks in Treatment: 8 History of Present Illness Location: right heel Quality: Patient reports experiencing a dull pain to affected area(s). Severity: Patient states wound are getting worse. Duration: Patient has had the wound for < 8 weeks prior to presenting for treatment Timing: Pain in wound is Intermittent (comes and goes Context: The wound occurred when the patient the hip fracture and was in traction for a few days while in hospital Modifying Factors: Other treatment(s) tried include:local care symptomatic treatment Associated Signs and Symptoms: Patient reports having increase discharge. HPI Description: 82 year old patient has been referred to as by her PCP for a pressure ulcer of the  right heel. his had this for about 8 weeks Has been treated with duoderm. Has been there for a while it's getting more painful and larger and she also was seen for a recent urinary tract infection. She was given ciprofloxacin for this and then changed on to nitrofurantoin. She has a past medical history of rheumatic fever, hyperlipidemia, hypertension, scoliosis and osteopenia. never been a smoker. 10/05/2015 -- x-ray of the right heel -- IMPRESSION:No osseous finding of significance. Abnormal appearance of the soft tissues posterior to the heel which could be due to infection or trauma. I do not see an intact Achilles tendon shadow. Electronic Signature(s) Signed: 11/09/2015 3:44:18 PM By: Ardath SaxParker, Kaijah Abts MD Entered By: Ardath SaxParker, Eligha Kmetz on 11/09/2015 15:44:17 [name], [name] ([id number]) -------------------------------------------------------------------------------- Physical Exam Details Patient Name: [name], [name] Date of Service: 11/09/2015 2:15 PM Medical Record Number: [id number] Patient Account Number: [id number] Date of Birth/Sex: 02/10/1924 (82 y.o. Female) Treating RN: Curtis Sitesorthy, Joanna Primary Care Physician: Rolm GalaGrandis, Heidi Other Clinician: Referring Physician: Rolm GalaGrandis, Heidi Treating Physician/Extender: Elayne SnarePARKER, Mardene Lessig Weeks in Treatment: 8 Electronic Signature(s) Signed: 11/09/2015 3:44:25 PM By: Ardath SaxParker, Kingdom Vanzanten MD Entered By: Ardath SaxParker, Gus Littler on 11/09/2015 15:44:24 [name], [name] ([id number]) -------------------------------------------------------------------------------- Physician Orders Details Patient Name: [name], [name] Date of Service: 11/09/2015 2:15 PM Medical Record Number: [id number] Patient Account Number: [id number] Date of Birth/Sex: 12/07/1924 (82 y.o. Female) Treating RN: Curtis Sitesorthy, Joanna Primary Care Physician: Rolm GalaGrandis, Heidi Other Clinician: Referring Physician: Rolm GalaGrandis, Heidi Treating Physician/Extender: Elayne SnarePARKER, Caitrin Pendergraph Weeks in Treatment: 8 Verbal / Phone Orders:  Yes Clinician: Curtis Sitesorthy, Joanna Read Back and Verified: Yes Diagnosis Coding Wound Cleansing Wound #1 Right Calcaneus o Cleanse wound with mild soap and water o May Shower, gently pat wound dry prior to applying new dressing. o May shower with protection. Anesthetic Wound #1 Right Calcaneus o Topical Lidocaine 4% cream applied to wound bed prior to debridement - In Clinic Primary Wound Dressing Wound #1 Right Calcaneus o Prisma Ag Secondary  Dressing Wound #1 Right Calcaneus o Gauze and Kerlix/Conform - netting Dressing Change Frequency Wound #1 Right Calcaneus o Three times weekly - For Home Health Specifics Follow-up Appointments Wound #1 Right Calcaneus o Return Appointment in 1 week. Off-Loading Wound #1 Right Calcaneus o Heel suspension boot to: - [employer] Additional Orders / Instructions Wound #1 Right Calcaneus o Increase protein intake. o Activity as tolerated o Other: - Include these vitamins in your diet, MVI, ZINC, Vit C, Vit A [name], [name] ([id number]) Home Health Wound #1 Right Calcaneus o Continue Home Health Visits - [facility] o Home Health Nurse may visit PRN to address patientos wound care needs. o FACE TO FACE ENCOUNTER: MEDICARE and MEDICAID PATIENTS: I certify that this patient is under my care and that I had a face-to-face encounter that meets the physician face-to-face encounter requirements with this patient on this date. The encounter with the patient was in whole or in part for the following MEDICAL CONDITION: (primary reason for Home Healthcare) MEDICAL NECESSITY: I certify, that based on my findings, NURSING services are a medically necessary home health service. HOME BOUND STATUS: I certify that my clinical findings support that this patient is homebound (i.e., Due to illness or injury, pt requires aid of supportive devices such as crutches, cane, wheelchairs, walkers, the use of special transportation or the  assistance of another person to leave their place of residence. There is a normal inability to leave the home and doing so requires considerable and taxing effort. Other absences are for medical reasons / religious services and are infrequent or of short duration when for other reasons). o If current dressing causes regression in wound condition, may D/C ordered dressing product/s and apply Normal Saline Moist Dressing daily until next Wound Healing Center / Other MD appointment. Notify Wound Healing Center of regression in wound condition at [phone]. o Please direct any NON-WOUND related issues/requests for orders to patient's Primary Care Physician Electronic Signature(s) Signed: 11/09/2015 3:59:06 PM By: Ardath Sax MD Signed: 11/09/2015 4:26:40 PM By: Curtis Sites Entered By: Curtis Sites on 11/09/2015 14:46:43 [name] ([id number]) -------------------------------------------------------------------------------- Problem List Details Patient Name: [name] Date of Service: 11/09/2015 2:15 PM Medical Record Number: [id number] Patient Account Number: [id number] Date of Birth/Sex: 1924-08-04 (82 y.o. Female) Treating RN: Curtis Sites Primary Care Physician: Rolm Gala Other Clinician: Referring Physician: Rolm Gala Treating Physician/Extender: Elayne Snare in Treatment: 8 Active Problems ICD-10 Encounter Code Description Active Date Diagnosis L89.613 Pressure ulcer of right heel, stage 3 09/14/2015 Yes E44.1 Mild protein-calorie malnutrition 09/14/2015 Yes Inactive Problems Resolved Problems Electronic Signature(s) Signed: 11/09/2015 3:43:44 PM By: Ardath Sax MD Entered By: Ardath Sax on 11/09/2015 15:43:43 [name] ([id number]) -------------------------------------------------------------------------------- Progress Note Details Patient Name: [name] Date of Service: 11/09/2015 2:15 PM Medical Record Number:  [id number] Patient Account Number: [id number] Date of Birth/Sex: 07-Dec-1924 (82 y.o. Female) Treating RN: Curtis Sites Primary Care Physician: Rolm Gala Other Clinician: Referring Physician: Rolm Gala Treating Physician/Extender: Elayne Snare in Treatment: 8 Subjective Chief Complaint Information obtained from Patient Patient is at the clinic for treatment of an open pressure ulcer right heel which she's had for about 8 weeks History of Present Illness (HPI) The following HPI elements were documented for the patient's wound: Location: right heel Quality: Patient reports experiencing a dull pain to affected area(s). Severity: Patient states wound are getting worse. Duration: Patient has had the wound for < 8 weeks prior to presenting for treatment Timing: Pain in wound  is Intermittent (comes and goes Context: The wound occurred when the patient the hip fracture and was in traction for a few days while in hospital Modifying Factors: Other treatment(s) tried include:local care symptomatic treatment Associated Signs and Symptoms: Patient reports having increase discharge. 82 year old patient has been referred to as by her PCP for a pressure ulcer of the right heel. his had this for about 8 weeks Has been treated with duoderm. Has been there for a while it's getting more painful and larger and she also was seen for a recent urinary tract infection. She was given ciprofloxacin for this and then changed on to nitrofurantoin. She has a past medical history of rheumatic fever, hyperlipidemia, hypertension, scoliosis and osteopenia. never been a smoker. 10/05/2015 -- x-ray of the right heel -- IMPRESSION:No osseous finding of significance. Abnormal appearance of the soft tissues posterior to the heel which could be due to infection or trauma. I do not see an intact Achilles tendon shadow. Objective Constitutional Vitals Time Taken: 2:25 PM, Height: 59 in, Weight: 108 lbs, BMI:  21.8, Temperature: 97.8 F, Pulse: 86 bpm, Respiratory Rate: 16 breaths/min, Blood Pressure: 134/56 mmHg. Mullenbach, Sincerity L. ([id number]) Integumentary (Hair, Skin) Wound #1 status is Open. Original cause of wound was Pressure Injury. The wound is located on the Right Calcaneus. The wound measures 1.5cm length x 1.4cm width x 0.3cm depth; 1.649cm^2 area and 0.495cm^3 volume. The wound is limited to skin breakdown. There is no tunneling or undermining noted. There is a large amount of serous drainage noted. The wound margin is flat and intact. There is medium (34-66%) red granulation within the wound bed. There is a medium (34-66%) amount of necrotic tissue within the wound bed including Eschar and Adherent Slough. The periwound skin appearance exhibited: Moist, Erythema. The periwound skin appearance did not exhibit: Callus, Crepitus, Excoriation, Fluctuance, Friable, Induration, Localized Edema, Rash, Scarring, Dry/Scaly, Maceration, Atrophie Blanche, Cyanosis, Ecchymosis, Hemosiderin Staining, Mottled, Pallor, Rubor. The surrounding wound skin color is noted with erythema which is circumferential. Periwound temperature was noted as No Abnormality. The periwound has tenderness on palpation. Pressure ulcer stage 3 right heel 4cm size. Treating with prisma and off loading Assessment Active Problems ICD-10 L89.613 - Pressure ulcer of right heel, stage 3 E44.1 - Mild protein-calorie malnutrition Plan Wound Cleansing: Wound #1 Right Calcaneus: Cleanse wound with mild soap and water May Shower, gently pat wound dry prior to applying new dressing. May shower with protection. Anesthetic: Wound #1 Right Calcaneus: Topical Lidocaine 4% cream applied to wound bed prior to debridement - In Clinic Primary Wound Dressing: Wound #1 Right Calcaneus: Prisma Ag Secondary Dressing: Wound #1 Right Calcaneus: Gauze and Kerlix/Conform - netting Dressing Change Frequency: Stjulien, Karthika L.  ([id number]) Wound #1 Right Calcaneus: Three times weekly - For Home Health Specifics Follow-up Appointments: Wound #1 Right Calcaneus: Return Appointment in 1 week. Off-Loading: Wound #1 Right Calcaneus: Heel suspension boot to: - [employer] Additional Orders / Instructions: Wound #1 Right Calcaneus: Increase protein intake. Activity as tolerated Other: - Include these vitamins in your diet, MVI, ZINC, Vit C, Vit A Home Health: Wound #1 Right Calcaneus: Continue Home Health Visits - [facility] Nurse may visit PRN to address patient s wound care needs. FACE TO FACE ENCOUNTER: MEDICARE and MEDICAID PATIENTS: I certify that this patient is under my care and that I had a face-to-face encounter that meets the physician face-to-face encounter requirements with this patient on this date. The encounter with the patient was in whole or  in part for the following MEDICAL CONDITION: (primary reason for Home Healthcare) MEDICAL NECESSITY: I certify, that based on my findings, NURSING services are a medically necessary home health service. HOME BOUND STATUS: I certify that my clinical findings support that this patient is homebound (i.e., Due to illness or injury, pt requires aid of supportive devices such as crutches, cane, wheelchairs, walkers, the use of special transportation or the assistance of another person to leave their place of residence. There is a normal inability to leave the home and doing so requires considerable and taxing effort. Other absences are for medical reasons / religious services and are infrequent or of short duration when for other reasons). If current dressing causes regression in wound condition, may D/C ordered dressing product/s and apply Normal Saline Moist Dressing daily until next Wound Healing Center / Other MD appointment. Notify Wound Healing Center of regression in wound condition at [phone]. Please direct any NON-WOUND related issues/requests  for orders to patient's Primary Care Physician Follow-Up Appointments: A follow-up appointment should be scheduled. A Patient Clinical Summary of Care was provided to [facility] Electronic Signature(s) Signed: 11/09/2015 3:46:08 PM By: Ardath Sax MD Entered By: Ardath Sax on 11/09/2015 15:46:08 [name] ([id number]) -------------------------------------------------------------------------------- SuperBill Details Patient Name: [name] Date of Service: 11/09/2015 Medical Record Number: [id number] Patient Account Number: [id number] Date of Birth/Sex: August 28, 1924 (82 y.o. Female) Treating RN: Curtis Sites Primary Care Physician: Rolm Gala Other Clinician: Referring Physician: Rolm Gala Treating Physician/Extender: Elayne Snare in Treatment: 8 Diagnosis Coding ICD-10 Codes Code Description [phone] Pressure ulcer of right heel, stage 3 E44.1 Mild protein-calorie malnutrition Facility Procedures CPT4 Code: [id number] Description: 99213 - WOUND CARE VISIT-LEV 3 EST PT Modifier: Quantity: 1 Physician Procedures CPT4 Code: [id number] Description: 44034 - WC PHYS LEVEL 2 - EST PT ICD-10 Description Diagnosis L89.613 Pressure ulcer of right heel, stage 3 E44.1 Mild protein-calorie malnutrition Modifier: Quantity: 1 Electronic Signature(s) Signed: 11/09/2015 3:46:29 PM By: Ardath Sax MD Entered By: Ardath Sax on 11/09/2015 15:46:29

## 2015-11-10 NOTE — Progress Notes (Signed)
[name], [name] ([id number]) Visit Report for 11/09/2015 Arrival Information Details Patient Name: [name], [name] Date of Service: 11/09/2015 2:15 PM Medical Record Number: [id number] Patient Account Number: [id number] Date of Birth/Sex: [birth year] (82 y.o. Female) Treating RN: Curtis Sites Primary Care Physician: Rolm Gala Other Clinician: Referring Physician: Rolm Gala Treating Physician/Extender: Elayne Snare in Treatment: 8 Visit Information History Since Last Visit Added or deleted any medications: No Patient Arrived: Wheel Chair Any new allergies or adverse reactions: No Arrival Time: 14:24 Had a fall or experienced change in No Accompanied By: dtr activities of daily living that may affect Transfer Assistance: Manual risk of falls: Patient Identification Verified: Yes Signs or symptoms of abuse/neglect since last No Secondary Verification Process Yes visito Completed: Hospitalized since last visit: No Patient Requires Transmission- No Pain Present Now: No Based Precautions: Patient Has Alerts: Yes Patient Alerts: Patient on Blood Thinner eliquis Electronic Signature(s) Signed: 11/09/2015 4:26:40 PM By: Curtis Sites Entered By: Curtis Sites on 11/09/2015 14:24:59 [name] ([id number]) -------------------------------------------------------------------------------- Clinic Level of Care Assessment Details Patient Name: [name] Date of Service: 11/09/2015 2:15 PM Medical Record Number: [id number] Patient Account Number: [id number] Date of Birth/Sex: 1924-09-05 (82 y.o. Female) Treating RN: Curtis Sites Primary Care Physician: Rolm Gala Other Clinician: Referring Physician: Rolm Gala Treating Physician/Extender: Elayne Snare in Treatment: 8 Clinic Level of Care Assessment Items TOOL 4 Quantity Score []  - Use when only an EandM is performed on FOLLOW-UP visit 0 ASSESSMENTS - Nursing Assessment /  Reassessment X - Reassessment of Co-morbidities (includes updates in patient status) 1 10 X - Reassessment of Adherence to Treatment Plan 1 5 ASSESSMENTS - Wound and Skin Assessment / Reassessment X - Simple Wound Assessment / Reassessment - one wound 1 5 []  - Complex Wound Assessment / Reassessment - multiple wounds 0 []  - Dermatologic / Skin Assessment (not related to wound area) 0 ASSESSMENTS - Focused Assessment []  - Circumferential Edema Measurements - multi extremities 0 []  - Nutritional Assessment / Counseling / Intervention 0 X - Lower Extremity Assessment (monofilament, tuning fork, pulses) 1 5 []  - Peripheral Arterial Disease Assessment (using hand held doppler) 0 ASSESSMENTS - Ostomy and/or Continence Assessment and Care []  - Incontinence Assessment and Management 0 []  - Ostomy Care Assessment and Management (repouching, etc.) 0 PROCESS - Coordination of Care X - Simple Patient / Family Education for ongoing care 1 15 []  - Complex (extensive) Patient / Family Education for ongoing care 0 []  - Staff obtains Chiropractor, Records, Test Results / Process Orders 0 []  - Staff telephones HHA, Nursing Homes / Clarify orders / etc 0 []  - Routine Transfer to another Facility (non-emergent condition) 0 [name], [name] []  - Routine Hospital Admission (non-emergent condition) 0 []  - New Admissions / Manufacturing engineer / Ordering NPWT, Apligraf, etc. 0 []  - Emergency Hospital Admission (emergent condition) 0 X - Simple Discharge Coordination 1 10 []  - Complex (extensive) Discharge Coordination 0 PROCESS - Special Needs []  - Pediatric / Minor Patient Management 0 []  - Isolation Patient Management 0 []  - Hearing / Language / Visual special needs 0 []  - Assessment of Community assistance (transportation, D/C planning, etc.) 0 []  - Additional assistance / Altered mentation 0 []  - Support Surface(s) Assessment (bed, cushion, seat, etc.) 0 INTERVENTIONS - Wound Cleansing /  Measurement X - Simple Wound Cleansing - one wound 1 5 []  - Complex Wound Cleansing - multiple wounds 0 X - Wound Imaging (photographs - any number of wounds) 1 5 []  -  Wound Tracing (instead of photographs) 0 X - Simple Wound Measurement - one wound 1 5 []  - Complex Wound Measurement - multiple wounds 0 INTERVENTIONS - Wound Dressings X - Small Wound Dressing one or multiple wounds 1 10 []  - Medium Wound Dressing one or multiple wounds 0 []  - Large Wound Dressing one or multiple wounds 0 []  - Application of Medications - topical 0 []  - Application of Medications - injection 0 INTERVENTIONS - Miscellaneous []  - External ear exam 0 [name], [name] []  - Specimen Collection (cultures, biopsies, blood, body fluids, etc.) 0 []  - Specimen(s) / Culture(s) sent or taken to Lab for analysis 0 []  - Patient Transfer (multiple staff / Michiel Sites Lift / Similar devices) 0 []  - Simple Staple / Suture removal (25 or less) 0 []  - Complex Staple / Suture removal (26 or more) 0 []  - Hypo / Hyperglycemic Management (close monitor of Blood Glucose) 0 []  - Ankle / Brachial Index (ABI) - do not check if billed separately 0 X - Vital Signs 1 5 Has the patient been seen at the hospital within the last three years: Yes Total Score: 80 Level Of Care: New/Established - Level 3 Electronic Signature(s) Signed: 11/09/2015 4:26:40 PM By: Curtis Sites Entered By: Curtis Sites on 11/09/2015 15:16:30 [name] ([id number]) -------------------------------------------------------------------------------- Encounter Discharge Information Details Patient Name: [name] Date of Service: 11/09/2015 2:15 PM Medical Record Number: [id number] Patient Account Number: [id number] Date of Birth/Sex: April 29, 1924 (82 y.o. Female) Treating RN: Curtis Sites Primary Care Physician: Rolm Gala Other Clinician: Referring Physician: Rolm Gala Treating Physician/Extender: Elayne Snare in  Treatment: 8 Encounter Discharge Information Items Discharge Pain Level: 0 Discharge Condition: Stable Ambulatory Status: Wheelchair Discharge Destination: Home Transportation: Private Auto Accompanied By: dtr Schedule Follow-up Appointment: Yes Medication Reconciliation completed and provided to Patient/Care No [name]: Provided on Clinical Summary of Care: 11/09/2015 Form Type Recipient Paper Patient DI Electronic Signature(s) Signed: 11/09/2015 3:46:56 PM By: Ardath Sax MD Previous Signature: 11/09/2015 3:17:14 PM Version By: Curtis Sites Previous Signature: 11/09/2015 2:55:55 PM Version By: Gwenlyn Perking Entered By: Ardath Sax on 11/09/2015 15:46:56 [name] ([id number]) -------------------------------------------------------------------------------- Lower Extremity Assessment Details Patient Name: [name] Date of Service: 11/09/2015 2:15 PM Medical Record Number: [id number] Patient Account Number: [id number] Date of Birth/Sex: 1924-12-30 (82 y.o. Female) Treating RN: Curtis Sites Primary Care Physician: Rolm Gala Other Clinician: Referring Physician: Rolm Gala Treating Physician/Extender: Ardath Sax Weeks in Treatment: 8 Edema Assessment Assessed: [Left: No] [Right: No] Edema: [Left: N] [Right: o] Vascular Assessment Pulses: Posterior Tibial Dorsalis Pedis Palpable: [Right:Yes] Extremity colors, hair growth, and conditions: Extremity Color: [Right:Normal] Hair Growth on Extremity: [Right:Yes] Temperature of Extremity: [Right:Warm] Capillary Refill: [Right:< 3 seconds] Electronic Signature(s) Signed: 11/09/2015 4:26:40 PM By: Curtis Sites Entered By: Curtis Sites on 11/09/2015 14:31:30 [name], [name] ([id number]) -------------------------------------------------------------------------------- Multi Wound Chart Details Patient Name: [name] Date of Service: 11/09/2015 2:15 PM Medical Record Number:  [id number] Patient Account Number: [id number] Date of Birth/Sex: 02/17/1924 (82 y.o. Female) Treating RN: Curtis Sites Primary Care Physician: Rolm Gala Other Clinician: Referring Physician: Rolm Gala Treating Physician/Extender: Elayne Snare in Treatment: 8 Vital Signs Height(in): 59 Pulse(bpm): 86 Weight(lbs): 108 Blood Pressure 134/56 (mmHg): Body Mass Index(BMI): 22 Temperature(F): 97.8 Respiratory Rate 16 (breaths/min): Photos: [N/A:N/A] Wound Location: Right Calcaneus N/A N/A Wounding Event: Pressure Injury N/A N/A Primary Etiology: Pressure Ulcer N/A N/A Comorbid History: Deep Vein Thrombosis, N/A N/A Hypertension, Dementia Date Acquired: 07/14/2015 N/A N/A Weeks of Treatment:  8 N/A N/A Wound Status: Open N/A N/A Measurements L x W x D 1.5x1.4x0.3 N/A N/A (cm) Area (cm) : 1.649 N/A N/A Volume (cm) : 0.495 N/A N/A % Reduction in Area: 70.10% N/A N/A % Reduction in Volume: 55.10% N/A N/A Classification: Category/Stage III N/A N/A Exudate Amount: Large N/A N/A Exudate Type: Serous N/A N/A Exudate Color: amber N/A N/A Wound Margin: Flat and Intact N/A N/A Granulation Amount: Medium (34-66%) N/A N/A Granulation Quality: Red N/A N/A Necrotic Amount: Medium (34-66%) N/A N/A Clinch, Starlet L. ([id number]) Necrotic Tissue: Eschar, Adherent Slough N/A N/A Exposed Structures: Fascia: No N/A N/A Fat: No Tendon: No Muscle: No Joint: No Bone: No Limited to Skin Breakdown Epithelialization: None N/A N/A Periwound Skin Texture: Edema: No N/A N/A Excoriation: No Induration: No Callus: No Crepitus: No Fluctuance: No Friable: No Rash: No Scarring: No Periwound Skin Moist: Yes N/A N/A Moisture: Maceration: No Dry/Scaly: No Periwound Skin Color: Erythema: Yes N/A N/A Atrophie Blanche: No Cyanosis: No Ecchymosis: No Hemosiderin Staining: No Mottled: No Pallor: No Rubor: No Erythema Location: Circumferential N/A N/A Temperature: No  Abnormality N/A N/A Tenderness on Yes N/A N/A Palpation: Wound Preparation: Ulcer Cleansing: N/A N/A Rinsed/Irrigated with Saline Topical Anesthetic Applied: Other: lidocaine 4% Treatment Notes Electronic Signature(s) Signed: 11/09/2015 4:26:40 PM By: Curtis Sitesorthy, Joanna Entered By: Curtis Sitesorthy, Joanna on 11/09/2015 14:34:26 Colleen QualeISLEY, [name] ([id number]) -------------------------------------------------------------------------------- Multi-Disciplinary Care Plan Details Patient Name: Colleen QualeISLEY, [name] Date of Service: 11/09/2015 2:15 PM Medical Record Number: [id number] Patient Account Number: [id number] Date of Birth/Sex: 05/26/1924 (82 y.o. Female) Treating RN: Curtis Sitesorthy, Joanna Primary Care Physician: Rolm GalaGrandis, Heidi Other Clinician: Referring Physician: Rolm GalaGrandis, Heidi Treating Physician/Extender: Elayne SnarePARKER, PETER Weeks in Treatment: 8 Active Inactive Abuse / Safety / Falls / Self Care Management Nursing Diagnoses: Impaired home maintenance Impaired physical mobility Potential for falls Self care deficit: actual or potential Goals: Patient will remain injury free Date Initiated: 09/14/2015 Goal Status: Active Patient/caregiver will verbalize understanding of skin care regimen Date Initiated: 09/14/2015 Goal Status: Active Patient/caregiver will verbalize/demonstrate measure taken to improve self care Date Initiated: 09/14/2015 Goal Status: Active Patient/caregiver will verbalize/demonstrate measures taken to improve the patient's personal safety Date Initiated: 09/14/2015 Goal Status: Active Patient/caregiver will verbalize/demonstrate measures taken to prevent injury and/or falls Date Initiated: 09/14/2015 Goal Status: Active Patient/caregiver will verbalize/demonstrate understanding of what to do in case of emergency Date Initiated: 09/14/2015 Goal Status: Active Interventions: Assess fall risk on admission and as needed Assess: immobility, friction, shearing, incontinence upon  admission and as needed Assess impairment of mobility on admission and as needed per policy Assess self care needs on admission and as needed Provide education on basic hygiene Provide education on personal and home safety Colleen QualeSLEY, [name] ([id number]) Provide education on safe transfers Notes: Orientation to the Wound Care Program Nursing Diagnoses: Knowledge deficit related to the wound healing center program Goals: Patient/caregiver will verbalize understanding of the Wound Healing Center Program Date Initiated: 09/14/2015 Goal Status: Active Interventions: Provide education on orientation to the wound center Notes: Pressure Nursing Diagnoses: Knowledge deficit related to causes and risk factors for pressure ulcer development Knowledge deficit related to management of pressures ulcers Potential for impaired tissue integrity related to pressure, friction, moisture, and shear Goals: Patient will remain free from development of additional pressure ulcers Date Initiated: 09/14/2015 Goal Status: Active Patient will remain free of pressure ulcers Date Initiated: 09/14/2015 Goal Status: Active Patient/caregiver will verbalize risk factors for pressure ulcer development Date Initiated: 09/14/2015 Goal Status: Active Patient/caregiver will verbalize understanding of pressure  ulcer management Date Initiated: 09/14/2015 Goal Status: Active Interventions: Assess: immobility, friction, shearing, incontinence upon admission and as needed Assess offloading mechanisms upon admission and as needed Assess potential for pressure ulcer upon admission and as needed Provide education on pressure ulcers Treatment Activities: [name], [name] ([id number]) Patient referred for pressure reduction/relief devices : 09/14/2015 Patient referred for seating evaluation to ensure proper offloading : 09/14/2015 Pressure reduction/relief device ordered : 09/14/2015 Notes: Wound/Skin Impairment Nursing  Diagnoses: Impaired tissue integrity Knowledge deficit related to ulceration/compromised skin integrity Goals: Patient/caregiver will verbalize understanding of skin care regimen Date Initiated: 09/14/2015 Goal Status: Active Ulcer/skin breakdown will have a volume reduction of 30% by week 4 Date Initiated: 09/14/2015 Goal Status: Active Ulcer/skin breakdown will have a volume reduction of 50% by week 8 Date Initiated: 09/14/2015 Goal Status: Active Ulcer/skin breakdown will have a volume reduction of 80% by week 12 Date Initiated: 09/14/2015 Goal Status: Active Ulcer/skin breakdown will heal within 14 weeks Date Initiated: 09/14/2015 Goal Status: Active Interventions: Assess patient/caregiver ability to obtain necessary supplies Assess patient/caregiver ability to perform ulcer/skin care regimen upon admission and as needed Assess ulceration(s) every visit Provide education on ulcer and skin care Treatment Activities: Referred to DME [facility] for dressing supplies : 09/14/2015 Skin care regimen initiated : 09/14/2015 Topical wound management initiated : 09/14/2015 Notes: Electronic Signature(s) Signed: 11/09/2015 4:26:40 PM By: Curtis Sites Entered By: Curtis Sites on 11/09/2015 14:34:19 [name] ([id number] Mallick, Theora Gianotti ([id number]) -------------------------------------------------------------------------------- Pain Assessment Details Patient Name: [name] Date of Service: 11/09/2015 2:15 PM Medical Record Number: [id number] Patient Account Number: [id number] Date of Birth/Sex: 06/22/1924 (82 y.o. Female) Treating RN: Curtis Sites Primary Care Physician: Rolm Gala Other Clinician: Referring Physician: Rolm Gala Treating Physician/Extender: Elayne Snare in Treatment: 8 Active Problems Location of Pain Severity and Description of Pain Patient Has Paino No Site Locations Pain Management and Medication Current Pain  Management: Notes Topical or injectable lidocaine is offered to patient for acute pain when surgical debridement is performed. If needed, Patient is instructed to use over the counter pain medication for the following 24-48 hours after debridement. Wound care MDs do not prescribed pain medications. Patient has chronic pain or uncontrolled pain. Patient has been instructed to make an appointment with their Primary Care Physician for pain management. Electronic Signature(s) Signed: 11/09/2015 4:26:40 PM By: Curtis Sites Entered By: Curtis Sites on 11/09/2015 14:25:15 [name] ([id number]) -------------------------------------------------------------------------------- Patient/Caregiver Education Details Patient Name: [name] Date of Service: 11/09/2015 2:15 PM Medical Record Number: [id number] Patient Account Number: [id number] Date of Birth/Gender: 1924-10-31 (82 y.o. Female) Treating RN: Curtis Sites Primary Care Physician: Rolm Gala Other Clinician: Referring Physician: Rolm Gala Treating Physician/Extender: Elayne Snare in Treatment: 8 Education Assessment Education Provided To: Patient and Caregiver Education Topics Provided Wound/Skin Impairment: Handouts: Other: wound care as ordered Methods: Demonstration, Explain/Verbal Responses: State content correctly Electronic Signature(s) Signed: 11/09/2015 3:59:06 PM By: Ardath Sax MD Entered By: Ardath Sax on 11/09/2015 15:47:03 [name] ([id number]) -------------------------------------------------------------------------------- Wound Assessment Details Patient Name: [name] Date of Service: 11/09/2015 2:15 PM Medical Record Number: [id number] Patient Account Number: [id number] Date of Birth/Sex: April 30, 1924 (82 y.o. Female) Treating RN: Curtis Sites Primary Care Physician: Rolm Gala Other Clinician: Referring Physician: Rolm Gala Treating  Physician/Extender: Ardath Sax Weeks in Treatment: 8 Wound Status Wound Number: 1 Primary Pressure Ulcer Etiology: Wound Location: Right Calcaneus Wound Status: Open Wounding Event: Pressure Injury Comorbid Deep Vein Thrombosis, Date Acquired: 07/14/2015 History: Hypertension, Dementia Weeks  Of Treatment: 8 Clustered Wound: No Photos Wound Measurements Length: (cm) 1.5 Width: (cm) 1.4 Depth: (cm) 0.3 Area: (cm) 1.649 Volume: (cm) 0.495 % Reduction in Area: 70.1% % Reduction in Volume: 55.1% Epithelialization: None Tunneling: No Undermining: No Wound Description Classification: Category/Stage III Wound Margin: Flat and Intact Exudate Amount: Large Exudate Type: Serous Exudate Color: amber Foul Odor After Cleansing: No Wound Bed Granulation Amount: Medium (34-66%) Exposed Structure Granulation Quality: Red Fascia Exposed: No Necrotic Amount: Medium (34-66%) Fat Layer Exposed: No Necrotic Quality: Eschar, Adherent Slough Tendon Exposed: No Muscle Exposed: No Hayton, Dannette L. ([id number]) Joint Exposed: No Bone Exposed: No Limited to Skin Breakdown Periwound Skin Texture Texture Color No Abnormalities Noted: No No Abnormalities Noted: No Callus: No Atrophie Blanche: No Crepitus: No Cyanosis: No Excoriation: No Ecchymosis: No Fluctuance: No Erythema: Yes Friable: No Erythema Location: Circumferential Induration: No Hemosiderin Staining: No Localized Edema: No Mottled: No Rash: No Pallor: No Scarring: No Rubor: No Moisture Temperature / Pain No Abnormalities Noted: No Temperature: No Abnormality Dry / Scaly: No Tenderness on Palpation: Yes Maceration: No Moist: Yes Wound Preparation Ulcer Cleansing: Rinsed/Irrigated with Saline Topical Anesthetic Applied: Other: lidocaine 4%, Treatment Notes Wound #1 (Right Calcaneus) 1. Cleansed with: Clean wound with Normal Saline 2. Anesthetic Topical Lidocaine 4% cream to wound bed prior to  debridement 4. Dressing Applied: Prisma Ag 5. Secondary Dressing Applied Gauze and Kerlix/Conform Notes netting Electronic Signature(s) Signed: 11/09/2015 4:26:40 PM By: Curtis Sites Entered By: Curtis Sites on 11/09/2015 14:34:07 [name] ([id number]) -------------------------------------------------------------------------------- Vitals Details Patient Name: [name] Date of Service: 11/09/2015 2:15 PM Medical Record Number: [id number] Patient Account Number: [id number] Date of Birth/Sex: 1925-01-28 (82 y.o. Female) Treating RN: Curtis Sites Primary Care Physician: Rolm Gala Other Clinician: Referring Physician: Rolm Gala Treating Physician/Extender: Elayne Snare in Treatment: 8 Vital Signs Time Taken: 14:25 Temperature (F): 97.8 Height (in): 59 Pulse (bpm): 86 Weight (lbs): 108 Respiratory Rate (breaths/min): 16 Body Mass Index (BMI): 21.8 Blood Pressure (mmHg): 134/56 Reference Range: 80 - 120 mg / dl Electronic Signature(s) Signed: 11/09/2015 4:26:40 PM By: Curtis Sites Entered By: Curtis Sites on 11/09/2015 14:25:51

## 2015-11-16 ENCOUNTER — Encounter (HOSPITAL_BASED_OUTPATIENT_CLINIC_OR_DEPARTMENT_OTHER): Payer: Medicare Other | Admitting: General Surgery

## 2015-11-16 DIAGNOSIS — E441 Mild protein-calorie malnutrition: Secondary | ICD-10-CM | POA: Diagnosis not present

## 2015-11-16 DIAGNOSIS — L89613 Pressure ulcer of right heel, stage 3: Secondary | ICD-10-CM

## 2015-11-16 NOTE — Progress Notes (Signed)
Colleen, Chavez ([id number]) Visit Report for 10/26/2015 Chief Complaint Document Details Patient Name: [name], [name] Date of Service: 10/26/2015 10:00 AM Medical Record Number: [id number] Patient Account Number: [id number] Date of Birth/Sex: April 12, 1924 (82 y.o. Female) Treating RN: Curtis Sites Primary Care Physician: Rolm Gala Other Clinician: Referring Physician: Rolm Gala Treating Physician/Extender: Rudene Re in Treatment: 6 Information Obtained from: Patient Chief Complaint Patient is at the clinic for treatment of an open pressure ulcer right heel which she's had for about 8 weeks Electronic Signature(s) Signed: 10/26/2015 11:18:17 AM By: Evlyn Kanner MD, FACS Entered By: Evlyn Kanner on 10/26/2015 11:18:16 [name] ([id number]) -------------------------------------------------------------------------------- Debridement Details Patient Name: [name] Date of Service: 10/26/2015 10:00 AM Medical Record Number: [id number] Patient Account Number: [id number] Date of Birth/Sex: 10-13-1924 (82 y.o. Female) Treating RN: Curtis Sites Primary Care Physician: Rolm Gala Other Clinician: Referring Physician: Rolm Gala Treating Physician/Extender: Rudene Re in Treatment: 6 Debridement Performed for Wound #1 Right Calcaneus Assessment: Performed By: Physician Evlyn Kanner, MD Debridement: Debridement Pre-procedure Yes - 10:43 Verification/Time Out Taken: Start Time: 10:42 Pain Control: Other : lidocaine 4% Level: Skin/Subcutaneous Tissue Total Area Debrided (L x 2 (cm) x 2 (cm) = 4 (cm) W): Tissue and other Viable, Non-Viable, Eschar, Exudate, Fibrin/Slough, Subcutaneous material debrided: Instrument: Curette Bleeding: Moderate Hemostasis Achieved: Pressure End Time: 10:47 Procedural Pain: 0 Post Procedural Pain: 0 Response to Treatment: Procedure was tolerated well Post Debridement Measurements of Total  Wound Length: (cm) 2 Stage: Category/Stage III Width: (cm) 2 Depth: (cm) 0.8 Volume: (cm) 2.513 Character of Wound/Ulcer Post Requires Further Debridement: Debridement Severity of Tissue Post Fat layer exposed Debridement: Post Procedure Diagnosis Same as Pre-procedure Electronic Signature(s) Signed: 10/26/2015 11:18:05 AM By: Evlyn Kanner MD, FACS Signed: 11/15/2015 4:35:19 PM By: Curtis Sites Entered By: Evlyn Kanner on 10/26/2015 11:18:05 [name] ([id number] Mallick, Theora Gianotti ([id number]) -------------------------------------------------------------------------------- HPI Details Patient Name: [name] Date of Service: 10/26/2015 10:00 AM Medical Record Number: [id number] Patient Account Number: [id number] Date of Birth/Sex: 1925/01/13 (82 y.o. Female) Treating RN: Curtis Sites Primary Care Physician: Rolm Gala Other Clinician: Referring Physician: Rolm Gala Treating Physician/Extender: Rudene Re in Treatment: 6 History of Present Illness Location: right heel Quality: Patient reports experiencing a dull pain to affected area(s). Severity: Patient states wound are getting worse. Duration: Patient has had the wound for < 8 weeks prior to presenting for treatment Timing: Pain in wound is Intermittent (comes and goes Context: The wound occurred when the patient the hip fracture and was in traction for a few days while in hospital Modifying Factors: Other treatment(s) tried include:local care symptomatic treatment Associated Signs and Symptoms: Patient reports having increase discharge. HPI Description: 82 year old patient has been referred to as by her PCP for a pressure ulcer of the right heel. his had this for about 8 weeks Has been treated with duoderm. Has been there for a while it's getting more painful and larger and she also was seen for a recent urinary tract infection. She was given ciprofloxacin for this and then changed on  to nitrofurantoin. She has a past medical history of rheumatic fever, hyperlipidemia, hypertension, scoliosis and osteopenia. never been a smoker. 10/05/2015 -- x-ray of the right heel -- IMPRESSION:No osseous finding of significance. Abnormal appearance of the soft tissues posterior to the heel which could be due to infection or trauma. I do not see an intact Achilles tendon shadow. Electronic Signature(s) Signed: 10/26/2015 11:18:31 AM By: Evlyn Kanner MD, FACS Entered  By: Evlyn Kanner on 10/26/2015 11:18:30 [name] ([id number]) -------------------------------------------------------------------------------- Physical Exam Details Patient Name: [name] Date of Service: 10/26/2015 10:00 AM Medical Record Number: [id number] Patient Account Number: [id number] Date of Birth/Sex: 04/04/1924 (82 y.o. Female) Treating RN: Curtis Sites Primary Care Physician: Rolm Gala Other Clinician: Referring Physician: Rolm Gala Treating Physician/Extender: Rudene Re in Treatment: 6 Constitutional . Pulse regular. Respirations normal and unlabored. Afebrile. . Eyes Nonicteric. Reactive to light. Ears, Nose, Mouth, and Throat Lips, teeth, and gums WNL.Marland Kitchen Moist mucosa without lesions. Neck supple and nontender. No palpable supraclavicular or cervical adenopathy. Normal sized without goiter. Respiratory WNL. No retractions.. Cardiovascular Pedal Pulses WNL. No clubbing, cyanosis or edema. Chest Breasts symmetical and no nipple discharge.. Breast tissue WNL, no masses, lumps, or tenderness.. Lymphatic No adneopathy. No adenopathy. No adenopathy. Musculoskeletal Adexa without tenderness or enlargement.. Digits and nails w/o clubbing, cyanosis, infection, petechiae, ischemia, or inflammatory conditions.. Integumentary (Hair, Skin) No suspicious lesions. No crepitus or fluctuance. No peri-wound warmth or erythema. No masses.Marland Kitchen Psychiatric Judgement and insight  Intact.. No evidence of depression, anxiety, or agitation.. Notes the wound has significant amount of subcutaneous debris and using a #3 curet I was able to sharply remove it and the area looks very clean underneath and I was pleased with the overall progress Electronic Signature(s) Signed: 10/26/2015 11:19:36 AM By: Evlyn Kanner MD, FACS Entered By: Evlyn Kanner on 10/26/2015 11:19:35 [name] ([id number]) -------------------------------------------------------------------------------- Physician Orders Details Patient Name: [name] Date of Service: 10/26/2015 10:00 AM Medical Record Number: [id number] Patient Account Number: [id number] Date of Birth/Sex: April 13, 1924 (82 y.o. Female) Treating RN: Huel Coventry Primary Care Physician: Rolm Gala Other Clinician: Referring Physician: Rolm Gala Treating Physician/Extender: Rudene Re in Treatment: 6 Verbal / Phone Orders: Yes Clinician: Huel Coventry Read Back and Verified: Yes Diagnosis Coding Wound Cleansing Wound #1 Right Calcaneus o Cleanse wound with mild soap and water o May Shower, gently pat wound dry prior to applying new dressing. o May shower with protection. Anesthetic Wound #1 Right Calcaneus o Topical Lidocaine 4% cream applied to wound bed prior to debridement - In Clinic Primary Wound Dressing Wound #1 Right Calcaneus o Prisma Ag Secondary Dressing Wound #1 Right Calcaneus o Boardered Foam Dressing Dressing Change Frequency Wound #1 Right Calcaneus o Three times weekly - For Home Health Specifics Follow-up Appointments Wound #1 Right Calcaneus o Return Appointment in 1 week. Off-Loading Wound #1 Right Calcaneus o Heel suspension boot to: - [employer] Additional Orders / Instructions Wound #1 Right Calcaneus o Increase protein intake. o Activity as tolerated o Other: - Include these vitamins in your diet, MVI, ZINC, Vit C, Vit A Kain, [place] L.  ([id number]) Home Health Wound #1 Right Calcaneus o Continue Home Health Visits - [facility] o Home Health Nurse may visit PRN to address patientos wound care needs. o FACE TO FACE ENCOUNTER: MEDICARE and MEDICAID PATIENTS: I certify that this patient is under my care and that I had a face-to-face encounter that meets the physician face-to-face encounter requirements with this patient on this date. The encounter with the patient was in whole or in part for the following MEDICAL CONDITION: (primary reason for Home Healthcare) MEDICAL NECESSITY: I certify, that based on my findings, NURSING services are a medically necessary home health service. HOME BOUND STATUS: I certify that my clinical findings support that this patient is homebound (i.e., Due to illness or injury, pt requires aid of supportive devices such as crutches, cane, wheelchairs, walkers, the  use of special transportation or the assistance of another person to leave their place of residence. There is a normal inability to leave the home and doing so requires considerable and taxing effort. Other absences are for medical reasons / religious services and are infrequent or of short duration when for other reasons). o If current dressing causes regression in wound condition, may D/C ordered dressing product/s and apply Normal Saline Moist Dressing daily until next Wound Healing Center / Other MD appointment. Notify Wound Healing Center of regression in wound condition at [phone]. o Please direct any NON-WOUND related issues/requests for orders to patient's Primary Care Physician Electronic Signature(s) Signed: 10/26/2015 11:43:27 AM By: Evlyn KannerBritto, Juddson Cobern MD, FACS Signed: 10/27/2015 10:50:50 AM By: Elliot GurneyWoody, RN, BSN, Kim RN, BSN Entered By: Elliot GurneyWoody, RN, BSN, Kim on 10/26/2015 10:48:47 Colleen QualeISLEY, Shanaiya L. ([id number]) -------------------------------------------------------------------------------- Problem List Details Patient Name:  Colleen QualeISLEY, Iysis L. Date of Service: 10/26/2015 10:00 AM Medical Record Number: [id number] Patient Account Number: [id number] Date of Birth/Sex: 04/12/1924 (82 y.o. Female) Treating RN: Curtis Sitesorthy, Joanna Primary Care Physician: Rolm GalaGrandis, Heidi Other Clinician: Referring Physician: Rolm GalaGrandis, Heidi Treating Physician/Extender: Rudene ReBritto, Tesla Bochicchio Weeks in Treatment: 6 Active Problems ICD-10 Encounter Code Description Active Date Diagnosis L89.613 Pressure ulcer of right heel, stage 3 09/14/2015 Yes E44.1 Mild protein-calorie malnutrition 09/14/2015 Yes Inactive Problems Resolved Problems Electronic Signature(s) Signed: 10/26/2015 11:15:37 AM By: Evlyn KannerBritto, Ignace Mandigo MD, FACS Entered By: Evlyn KannerBritto, Stephaine Breshears on 10/26/2015 11:15:37 Colleen QualeISLEY, Quentina L. ([id number]) -------------------------------------------------------------------------------- Progress Note Details Patient Name: Colleen QualeISLEY, Shaunice L. Date of Service: 10/26/2015 10:00 AM Medical Record Number: [id number] Patient Account Number: [id number] Date of Birth/Sex: 07/15/1924 (82 y.o. Female) Treating RN: Curtis Sitesorthy, Joanna Primary Care Physician: Rolm GalaGrandis, Heidi Other Clinician: Referring Physician: Rolm GalaGrandis, Heidi Treating Physician/Extender: Rudene ReBritto, Yoshiharu Brassell Weeks in Treatment: 6 Subjective Chief Complaint Information obtained from Patient Patient is at the clinic for treatment of an open pressure ulcer right heel which she's had for about 8 weeks History of Present Illness (HPI) The following HPI elements were documented for the patient's wound: Location: right heel Quality: Patient reports experiencing a dull pain to affected area(s). Severity: Patient states wound are getting worse. Duration: Patient has had the wound for < 8 weeks prior to presenting for treatment Timing: Pain in wound is Intermittent (comes and goes Context: The wound occurred when the patient the hip fracture and was in traction for a few days while in hospital Modifying Factors: Other  treatment(s) tried include:local care symptomatic treatment Associated Signs and Symptoms: Patient reports having increase discharge. 82 year old patient has been referred to as by her PCP for a pressure ulcer of the right heel. his had this for about 8 weeks Has been treated with duoderm. Has been there for a while it's getting more painful and larger and she also was seen for a recent urinary tract infection. She was given ciprofloxacin for this and then changed on to nitrofurantoin. She has a past medical history of rheumatic fever, hyperlipidemia, hypertension, scoliosis and osteopenia. never been a smoker. 10/05/2015 -- x-ray of the right heel -- IMPRESSION:No osseous finding of significance. Abnormal appearance of the soft tissues posterior to the heel which could be due to infection or trauma. I do not see an intact Achilles tendon shadow. Objective Constitutional Pulse regular. Respirations normal and unlabored. Afebrile. Vitals Time Taken: 10:13 AM, Height: 59 in, Weight: 108 lbs, BMI: 21.8, Temperature: 98.0 F, Pulse: 77 Gudino, Lashuna L. ([id number]) bpm, Respiratory Rate: 16 breaths/min, Blood Pressure: 132/72 mmHg. Eyes Nonicteric. Reactive to light. Ears, Nose, Mouth, and  Throat Lips, teeth, and gums WNL.Marland Kitchen Moist mucosa without lesions. Neck supple and nontender. No palpable supraclavicular or cervical adenopathy. Normal sized without goiter. Respiratory WNL. No retractions.. Cardiovascular Pedal Pulses WNL. No clubbing, cyanosis or edema. Chest Breasts symmetical and no nipple discharge.. Breast tissue WNL, no masses, lumps, or tenderness.. Lymphatic No adneopathy. No adenopathy. No adenopathy. Musculoskeletal Adexa without tenderness or enlargement.. Digits and nails w/o clubbing, cyanosis, infection, petechiae, ischemia, or inflammatory conditions.Marland Kitchen Psychiatric Judgement and insight Intact.. No evidence of depression, anxiety, or agitation.. General Notes: the  wound has significant amount of subcutaneous debris and using a #3 curet I was able to sharply remove it and the area looks very clean underneath and I was pleased with the overall progress Integumentary (Hair, Skin) No suspicious lesions. No crepitus or fluctuance. No peri-wound warmth or erythema. No masses.. Wound #1 status is Open. Original cause of wound was Pressure Injury. The wound is located on the Right Calcaneus. The wound measures 2cm length x 2cm width x 0.8cm depth; 3.142cm^2 area and 2.513cm^3 volume. The wound is limited to skin breakdown. There is a large amount of serous drainage noted. The wound margin is flat and intact. There is medium (34-66%) red granulation within the wound bed. There is a medium (34-66%) amount of necrotic tissue within the wound bed including Eschar and Adherent Slough. The periwound skin appearance exhibited: Moist, Erythema. The periwound skin appearance did not exhibit: Callus, Crepitus, Excoriation, Fluctuance, Friable, Induration, Localized Edema, Rash, Scarring, Dry/Scaly, Maceration, Atrophie Blanche, Cyanosis, Ecchymosis, Hemosiderin Staining, Mottled, Pallor, Rubor. The surrounding wound skin color is noted with erythema which is circumferential. Periwound temperature was noted as No Abnormality. The periwound has tenderness on palpation. KAWANDA, DRUMHELLER ([id number]) Assessment Active Problems ICD-10 L89.613 - Pressure ulcer of right heel, stage 3 E44.1 - Mild protein-calorie malnutrition Procedures Wound #1 Wound #1 is a Pressure Ulcer located on the Right Calcaneus . There was a Skin/Subcutaneous Tissue Debridement ([id number]) debridement with total area of 4 sq cm performed by Evlyn Kanner, MD. with the following instrument(s): Curette to remove Viable and Non-Viable tissue/material including Exudate, Fibrin/Slough, Eschar, and Subcutaneous after achieving pain control using Other (lidocaine 4%). A time out was conducted at 10:43,  prior to the start of the procedure. A Moderate amount of bleeding was controlled with Pressure. The procedure was tolerated well with a pain level of 0 throughout and a pain level of 0 following the procedure. Post Debridement Measurements: 2cm length x 2cm width x 0.8cm depth; 2.513cm^3 volume. Post debridement Stage noted as Category/Stage III. Character of Wound/Ulcer Post Debridement requires further debridement. Severity of Tissue Post Debridement is: Fat layer exposed. Post procedure Diagnosis Wound #1: Same as Pre-Procedure Plan Wound Cleansing: Wound #1 Right Calcaneus: Cleanse wound with mild soap and water May Shower, gently pat wound dry prior to applying new dressing. May shower with protection. Anesthetic: Wound #1 Right Calcaneus: Topical Lidocaine 4% cream applied to wound bed prior to debridement - In Clinic Primary Wound Dressing: Wound #1 Right Calcaneus: Prisma Ag Secondary Dressing: Jolin, Tamaiya L. ([id number]) Wound #1 Right Calcaneus: Boardered Foam Dressing Dressing Change Frequency: Wound #1 Right Calcaneus: Three times weekly - For Home Health Specifics Follow-up Appointments: Wound #1 Right Calcaneus: Return Appointment in 1 week. Off-Loading: Wound #1 Right Calcaneus: Heel suspension boot to: - [employer] Additional Orders / Instructions: Wound #1 Right Calcaneus: Increase protein intake. Activity as tolerated Other: - Include these vitamins in your diet, MVI, ZINC, Vit C, Vit A Home  Health: Wound #1 Right Calcaneus: Continue Home Health Visits - [facility] Nurse may visit PRN to address patient s wound care needs. FACE TO FACE ENCOUNTER: MEDICARE and MEDICAID PATIENTS: I certify that this patient is under my care and that I had a face-to-face encounter that meets the physician face-to-face encounter requirements with this patient on this date. The encounter with the patient was in whole or in part for the following MEDICAL CONDITION:  (primary reason for Home Healthcare) MEDICAL NECESSITY: I certify, that based on my findings, NURSING services are a medically necessary home health service. HOME BOUND STATUS: I certify that my clinical findings support that this patient is homebound (i.e., Due to illness or injury, pt requires aid of supportive devices such as crutches, cane, wheelchairs, walkers, the use of special transportation or the assistance of another person to leave their place of residence. There is a normal inability to leave the home and doing so requires considerable and taxing effort. Other absences are for medical reasons / religious services and are infrequent or of short duration when for other reasons). If current dressing causes regression in wound condition, may D/C ordered dressing product/s and apply Normal Saline Moist Dressing daily until next Wound Healing Center / Other MD appointment. Notify Wound Healing Center of regression in wound condition at [phone]. Please direct any NON-WOUND related issues/requests for orders to patient's Primary Care Physician I have recommended: 1. Prisma Ag locally to be applied every other day. 2. Offloading has been discussed in great detail 3. Adequate protein, vitamin A, vitamin C and zinc 4. regular visits to the wound center Electronic Signature(s) Signed: 10/26/2015 11:21:26 AM By: Evlyn Kanner MD, FACS [name] ([id number]) Entered By: Evlyn Kanner on 10/26/2015 11:21:25 [name] ([id number]) -------------------------------------------------------------------------------- SuperBill Details Patient Name: [name] Date of Service: 10/26/2015 Medical Record Number: [id number] Patient Account Number: [id number] Date of Birth/Sex: 1924-11-08 (82 y.o. Female) Treating RN: Curtis Sites Primary Care Physician: Rolm Gala Other Clinician: Referring Physician: Rolm Gala Treating Physician/Extender: Rudene Re in  Treatment: 6 Diagnosis Coding ICD-10 Codes Code Description [phone] Pressure ulcer of right heel, stage 3 E44.1 Mild protein-calorie malnutrition Facility Procedures CPT4 Code: [id number] Description: 11042 - DEB SUBQ TISSUE 20 SQ CM/< ICD-10 Description Diagnosis L89.613 Pressure ulcer of right heel, stage 3 E44.1 Mild protein-calorie malnutrition Modifier: Quantity: 1 Physician Procedures CPT4 Code: [id number] Description: 11042 - WC PHYS SUBQ TISS 20 SQ CM ICD-10 Description Diagnosis L89.613 Pressure ulcer of right heel, stage 3 E44.1 Mild protein-calorie malnutrition Modifier: Quantity: 1 Electronic Signature(s) Signed: 10/26/2015 11:21:36 AM By: Evlyn Kanner MD, FACS Entered By: Evlyn Kanner on 10/26/2015 11:21:36

## 2015-11-16 NOTE — Progress Notes (Signed)
See I heal 

## 2015-11-17 NOTE — Progress Notes (Signed)
WINNA, GOLLA ([id number]) Visit Report for 11/16/2015 Arrival Information Details Patient Name: [name], [name] Date of Service: 11/16/2015 3:00 PM Medical Record Number: [id number] Patient Account Number: [id number] Date of Birth/Sex: 16-Apr-1924 (82 y.o. Female) Treating RN: Curtis Sites Primary Care Physician: Rolm Gala Other Clinician: Referring Physician: Rolm Gala Treating Physician/Extender: Elayne Snare in Treatment: 9 Visit Information History Since Last Visit Added or deleted any medications: No Patient Arrived: Wheel Chair Any new allergies or adverse reactions: No Arrival Time: 15:04 Had a fall or experienced change in No Accompanied By: dtr activities of daily living that may affect Transfer Assistance: Manual risk of falls: Patient Identification Verified: Yes Signs or symptoms of abuse/neglect since last No Secondary Verification Process Yes visito Completed: Hospitalized since last visit: No Patient Requires Transmission- No Pain Present Now: No Based Precautions: Patient Has Alerts: Yes Patient Alerts: Patient on Blood Thinner eliquis Electronic Signature(s) Signed: 11/16/2015 4:07:00 PM By: Curtis Sites Entered By: Curtis Sites on 11/16/2015 15:07:52 Cerniglia, Theora Gianotti ([id number]) -------------------------------------------------------------------------------- Encounter Discharge Information Details Patient Name: [name] Date of Service: 11/16/2015 3:00 PM Medical Record Number: [id number] Patient Account Number: [id number] Date of Birth/Sex: 1925/01/29 (82 y.o. Female) Treating RN: Curtis Sites Primary Care Physician: Rolm Gala Other Clinician: Referring Physician: Rolm Gala Treating Physician/Extender: Elayne Snare in Treatment: 9 Encounter Discharge Information Items Discharge Pain Level: 0 Discharge Condition: Stable Ambulatory Status: Wheelchair Discharge Destination: Home Transportation:  Private Auto Accompanied By: dtr Schedule Follow-up Appointment: Yes Medication Reconciliation completed and provided to Patient/Care No Aydon Swamy: Provided on Clinical Summary of Care: 11/16/2015 Form Type Recipient Paper Patient DI Electronic Signature(s) Signed: 11/16/2015 3:40:12 PM By: Gwenlyn Perking Entered By: Gwenlyn Perking on 11/16/2015 15:40:11 [name] ([id number]) -------------------------------------------------------------------------------- Lower Extremity Assessment Details Patient Name: [name] Date of Service: 11/16/2015 3:00 PM Medical Record Number: [id number] Patient Account Number: [id number] Date of Birth/Sex: Sep 10, 1924 (82 y.o. Female) Treating RN: Curtis Sites Primary Care Physician: Rolm Gala Other Clinician: Referring Physician: Rolm Gala Treating Physician/Extender: Elayne Snare in Treatment: 9 Vascular Assessment Pulses: Posterior Tibial Dorsalis Pedis Palpable: [Right:Yes] Extremity colors, hair growth, and conditions: Extremity Color: [Right:Normal] Hair Growth on Extremity: [Right:Yes] Temperature of Extremity: [Right:Warm] Capillary Refill: [Right:< 3 seconds] Electronic Signature(s) Signed: 11/16/2015 4:07:00 PM By: Curtis Sites Entered By: Curtis Sites on 11/16/2015 15:14:07 [name], [name] ([id number]) -------------------------------------------------------------------------------- Multi Wound Chart Details Patient Name: [name] Date of Service: 11/16/2015 3:00 PM Medical Record Number: [id number] Patient Account Number: [id number] Date of Birth/Sex: [birth year] (82 y.o. Female) Treating RN: Curtis Sites Primary Care Physician: Rolm Gala Other Clinician: Referring Physician: Rolm Gala Treating Physician/Extender: Elayne Snare in Treatment: 9 Vital Signs Height(in): 59 Pulse(bpm): 71 Weight(lbs): 108 Blood Pressure 148/98 (mmHg): Body Mass Index(BMI):  22 Temperature(F): 97.6 Respiratory Rate 16 (breaths/min): Photos: [N/A:N/A] Wound Location: Right Calcaneus N/A N/A Wounding Event: Pressure Injury N/A N/A Primary Etiology: Pressure Ulcer N/A N/A Comorbid History: Deep Vein Thrombosis, N/A N/A Hypertension, Dementia Date Acquired: 07/14/2015 N/A N/A Weeks of Treatment: 9 N/A N/A Wound Status: Open N/A N/A Measurements L x W x D 1.2x1.2x0.2 N/A N/A (cm) Area (cm) : 1.131 N/A N/A Volume (cm) : 0.226 N/A N/A % Reduction in Area: 79.50% N/A N/A % Reduction in Volume: 79.50% N/A N/A Classification: Category/Stage III N/A N/A Exudate Amount: Large N/A N/A Exudate Type: Serous N/A N/A Exudate Color: amber N/A N/A Wound Margin: Flat and Intact N/A N/A Granulation Amount: Large (67-100%) N/A N/A Granulation Quality: Red N/A N/A  Necrotic Amount: Small (1-33%) N/A N/A [name], [name] ([id number]) Necrotic Tissue: Eschar, Adherent Slough N/A N/A Exposed Structures: Fascia: No N/A N/A Fat: No Tendon: No Muscle: No Joint: No Bone: No Limited to Skin Breakdown Epithelialization: None N/A N/A Periwound Skin Texture: Edema: No N/A N/A Excoriation: No Induration: No Callus: No Crepitus: No Fluctuance: No Friable: No Rash: No Scarring: No Periwound Skin Moist: Yes N/A N/A Moisture: Maceration: No Dry/Scaly: No Periwound Skin Color: Erythema: Yes N/A N/A Atrophie Blanche: No Cyanosis: No Ecchymosis: No Hemosiderin Staining: No Mottled: No Pallor: No Rubor: No Erythema Location: Circumferential N/A N/A Temperature: No Abnormality N/A N/A Tenderness on Yes N/A N/A Palpation: Wound Preparation: Ulcer Cleansing: N/A N/A Rinsed/Irrigated with Saline Topical Anesthetic Applied: Other: lidocaine 4% Treatment Notes Electronic Signature(s) Signed: 11/16/2015 4:07:00 PM By: Curtis Sites Entered By: Curtis Sites on 11/16/2015 15:21:20 [name]  ([id number]) -------------------------------------------------------------------------------- Multi-Disciplinary Care Plan Details Patient Name: [name] Date of Service: 11/16/2015 3:00 PM Medical Record Number: [id number] Patient Account Number: [id number] Date of Birth/Sex: Jan 15, 1925 (82 y.o. Female) Treating RN: Curtis Sites Primary Care Physician: Rolm Gala Other Clinician: Referring Physician: Rolm Gala Treating Physician/Extender: Elayne Snare in Treatment: 9 Active Inactive Abuse / Safety / Falls / Self Care Management Nursing Diagnoses: Impaired home maintenance Impaired physical mobility Potential for falls Self care deficit: actual or potential Goals: Patient will remain injury free Date Initiated: 09/14/2015 Goal Status: Active Patient/caregiver will verbalize understanding of skin care regimen Date Initiated: 09/14/2015 Goal Status: Active Patient/caregiver will verbalize/demonstrate measure taken to improve self care Date Initiated: 09/14/2015 Goal Status: Active Patient/caregiver will verbalize/demonstrate measures taken to improve the patient's personal safety Date Initiated: 09/14/2015 Goal Status: Active Patient/caregiver will verbalize/demonstrate measures taken to prevent injury and/or falls Date Initiated: 09/14/2015 Goal Status: Active Patient/caregiver will verbalize/demonstrate understanding of what to do in case of emergency Date Initiated: 09/14/2015 Goal Status: Active Interventions: Assess fall risk on admission and as needed Assess: immobility, friction, shearing, incontinence upon admission and as needed Assess impairment of mobility on admission and as needed per policy Assess self care needs on admission and as needed Provide education on basic hygiene Provide education on personal and home safety VERNELLA, NIZNIK ([id number]) Provide education on safe transfers Notes: Orientation to the Wound Care Program Nursing  Diagnoses: Knowledge deficit related to the wound healing center program Goals: Patient/caregiver will verbalize understanding of the Wound Healing Center Program Date Initiated: 09/14/2015 Goal Status: Active Interventions: Provide education on orientation to the wound center Notes: Pressure Nursing Diagnoses: Knowledge deficit related to causes and risk factors for pressure ulcer development Knowledge deficit related to management of pressures ulcers Potential for impaired tissue integrity related to pressure, friction, moisture, and shear Goals: Patient will remain free from development of additional pressure ulcers Date Initiated: 09/14/2015 Goal Status: Active Patient will remain free of pressure ulcers Date Initiated: 09/14/2015 Goal Status: Active Patient/caregiver will verbalize risk factors for pressure ulcer development Date Initiated: 09/14/2015 Goal Status: Active Patient/caregiver will verbalize understanding of pressure ulcer management Date Initiated: 09/14/2015 Goal Status: Active Interventions: Assess: immobility, friction, shearing, incontinence upon admission and as needed Assess offloading mechanisms upon admission and as needed Assess potential for pressure ulcer upon admission and as needed Provide education on pressure ulcers Treatment Activities: ELIS, RAWLINSON ([id number]) Patient referred for pressure reduction/relief devices : 09/14/2015 Patient referred for seating evaluation to ensure proper offloading : 09/14/2015 Pressure reduction/relief device ordered : 09/14/2015 Notes: Wound/Skin Impairment Nursing Diagnoses: Impaired tissue integrity Knowledge  deficit related to ulceration/compromised skin integrity Goals: Patient/caregiver will verbalize understanding of skin care regimen Date Initiated: 09/14/2015 Goal Status: Active Ulcer/skin breakdown will have a volume reduction of 30% by week 4 Date Initiated: 09/14/2015 Goal Status:  Active Ulcer/skin breakdown will have a volume reduction of 50% by week 8 Date Initiated: 09/14/2015 Goal Status: Active Ulcer/skin breakdown will have a volume reduction of 80% by week 12 Date Initiated: 09/14/2015 Goal Status: Active Ulcer/skin breakdown will heal within 14 weeks Date Initiated: 09/14/2015 Goal Status: Active Interventions: Assess patient/caregiver ability to obtain necessary supplies Assess patient/caregiver ability to perform ulcer/skin care regimen upon admission and as needed Assess ulceration(s) every visit Provide education on ulcer and skin care Treatment Activities: Referred to DME Wallace Cogliano for dressing supplies : 09/14/2015 Skin care regimen initiated : 09/14/2015 Topical wound management initiated : 09/14/2015 Notes: Electronic Signature(s) Signed: 11/16/2015 4:07:00 PM By: Curtis Sitesorthy, Joanna Entered By: Curtis Sitesorthy, Joanna on 11/16/2015 15:21:10 Colleen QualeISLEY, [name] ([id number] Mallick) Kopec, [name] ([id number]) -------------------------------------------------------------------------------- Pain Assessment Details Patient Name: Colleen QualeISLEY, [name] Date of Service: 11/16/2015 3:00 PM Medical Record Number: [id number] Patient Account Number: [id number] Date of Birth/Sex: 05/26/1924 (82 y.o. Female) Treating RN: Curtis Sitesorthy, Joanna Primary Care Physician: Rolm GalaGrandis, Heidi Other Clinician: Referring Physician: Rolm GalaGrandis, Heidi Treating Physician/Extender: Elayne SnarePARKER, PETER Weeks in Treatment: 9 Active Problems Location of Pain Severity and Description of Pain Patient Has Paino No Site Locations Pain Management and Medication Current Pain Management: Notes Topical or injectable lidocaine is offered to patient for acute pain when surgical debridement is performed. If needed, Patient is instructed to use over the counter pain medication for the following 24-48 hours after debridement. Wound care MDs do not prescribed pain medications. Patient has chronic pain or uncontrolled pain.  Patient has been instructed to make an appointment with their Primary Care Physician for pain management. Electronic Signature(s) Signed: 11/16/2015 4:07:00 PM By: Curtis Sitesorthy, Joanna Entered By: Curtis Sitesorthy, Joanna on 11/16/2015 15:08:14 Colleen QualeISLEY, [name] ([id number]) -------------------------------------------------------------------------------- Patient/Caregiver Education Details Patient Name: Colleen QualeISLEY, [name] Date of Service: 11/16/2015 3:00 PM Medical Record Number: [id number] Patient Account Number: [id number] Date of Birth/Gender: 09/12/1924 (82 y.o. Female) Treating RN: Curtis Sitesorthy, Joanna Primary Care Physician: Rolm GalaGrandis, Heidi Other Clinician: Referring Physician: Rolm GalaGrandis, Heidi Treating Physician/Extender: Elayne SnarePARKER, PETER Weeks in Treatment: 9 Education Assessment Education Provided To: Patient and Caregiver Education Topics Provided Wound/Skin Impairment: Handouts: Other: wound care s ordered Methods: Demonstration, Explain/Verbal Responses: State content correctly Electronic Signature(s) Signed: 11/16/2015 4:07:00 PM By: Curtis Sitesorthy, Joanna Previous Signature: 11/16/2015 3:41:47 PM Version By: Ardath SaxParker, Peter MD Entered By: Curtis Sitesorthy, Joanna on 11/16/2015 15:42:13 Colleen QualeISLEY, [name] ([id number]) -------------------------------------------------------------------------------- Wound Assessment Details Patient Name: Colleen QualeISLEY, [name] Date of Service: 11/16/2015 3:00 PM Medical Record Number: [id number] Patient Account Number: [id number] Date of Birth/Sex: 01/27/1925 (82 y.o. Female) Treating RN: Curtis Sitesorthy, Joanna Primary Care Physician: Rolm GalaGrandis, Heidi Other Clinician: Referring Physician: Rolm GalaGrandis, Heidi Treating Physician/Extender: Elayne SnarePARKER, PETER Weeks in Treatment: 9 Wound Status Wound Number: 1 Primary Pressure Ulcer Etiology: Wound Location: Right Calcaneus Wound Status: Open Wounding Event: Pressure Injury Comorbid Deep Vein Thrombosis, Date Acquired: 07/14/2015 History: Hypertension,  Dementia Weeks Of Treatment: 9 Clustered Wound: No Photos Wound Measurements Length: (cm) 1.2 Width: (cm) 1.2 Depth: (cm) 0.2 Area: (cm) 1.131 Volume: (cm) 0.226 % Reduction in Area: 79.5% % Reduction in Volume: 79.5% Epithelialization: None Tunneling: No Undermining: No Wound Description Classification: Category/Stage III Wound Margin: Flat and Intact Exudate Amount: Large Exudate Type: Serous Exudate Color: amber Foul Odor After Cleansing: No Wound Bed Granulation Amount: Large (67-100%) Exposed Structure Granulation  Quality: Red Fascia Exposed: No Necrotic Amount: Small (1-33%) Fat Layer Exposed: No Necrotic Quality: Eschar, Adherent Slough Tendon Exposed: No Muscle Exposed: No Malek, Madolyn L. ([id number]) Joint Exposed: No Bone Exposed: No Limited to Skin Breakdown Periwound Skin Texture Texture Color No Abnormalities Noted: No No Abnormalities Noted: No Callus: No Atrophie Blanche: No Crepitus: No Cyanosis: No Excoriation: No Ecchymosis: No Fluctuance: No Erythema: Yes Friable: No Erythema Location: Circumferential Induration: No Hemosiderin Staining: No Localized Edema: No Mottled: No Rash: No Pallor: No Scarring: No Rubor: No Moisture Temperature / Pain No Abnormalities Noted: No Temperature: No Abnormality Dry / Scaly: No Tenderness on Palpation: Yes Maceration: No Moist: Yes Wound Preparation Ulcer Cleansing: Rinsed/Irrigated with Saline Topical Anesthetic Applied: Other: lidocaine 4%, Treatment Notes Wound #1 (Right Calcaneus) 1. Cleansed with: Clean wound with Normal Saline 2. Anesthetic Topical Lidocaine 4% cream to wound bed prior to debridement 4. Dressing Applied: Prisma Ag 5. Secondary Dressing Applied Guaze, ABD and kerlix/Conform 7. Secured with Tape Notes netting Electronic Signature(s) Signed: 11/16/2015 4:07:00 PM By: Curtis Sites Entered By: Curtis Sites on 11/16/2015 15:20:02 [name]  ([id number]) -------------------------------------------------------------------------------- Vitals Details Patient Name: [name] Date of Service: 11/16/2015 3:00 PM Medical Record Number: [id number] Patient Account Number: [id number] Date of Birth/Sex: 1924/03/12 (82 y.o. Female) Treating RN: Curtis Sites Primary Care Physician: Rolm Gala Other Clinician: Referring Physician: Rolm Gala Treating Physician/Extender: Elayne Snare in Treatment: 9 Vital Signs Time Taken: 15:13 Temperature (F): 97.6 Height (in): 59 Pulse (bpm): 71 Weight (lbs): 108 Respiratory Rate (breaths/min): 16 Body Mass Index (BMI): 21.8 Blood Pressure (mmHg): 148/98 Reference Range: 80 - 120 mg / dl Electronic Signature(s) Signed: 11/16/2015 4:07:00 PM By: Curtis Sites Entered By: Curtis Sites on 11/16/2015 15:13:55

## 2015-11-17 NOTE — Progress Notes (Signed)
[name], Nathalia L. ([id number]) Visit Report for 11/16/2015 Chief Complaint Document Details Patient Name: [name], [name] Date of Service: 11/16/2015 3:00 PM Medical Record Number: [id number] Patient Account Number: [id number] Date of Birth/Sex: 01/05/1925 (82 y.o. Female) Treating RN: Curtis Sitesorthy, Joanna Primary Care Physician: Rolm GalaGrandis, Heidi Other Clinician: Referring Physician: Rolm GalaGrandis, Heidi Treating Physician/Extender: Elayne SnarePARKER, Mellony Danziger Weeks in Treatment: 9 Information Obtained from: Patient Chief Complaint Patient is at the clinic for treatment of an open pressure ulcer right heel which she's had for about 8 weeks Electronic Signature(s) Signed: 11/16/2015 3:41:47 PM By: Ardath SaxParker, Jash Wahlen MD Entered By: Ardath SaxParker, Tawfiq Favila on 11/16/2015 15:08:47 [name], [name] ([id number]) -------------------------------------------------------------------------------- Debridement Details Patient Name: [name], [name] Date of Service: 11/16/2015 3:00 PM Medical Record Number: [id number] Patient Account Number: [id number] Date of Birth/Sex: 03/13/1924 (82 y.o. Female) Treating RN: Curtis Sitesorthy, Joanna Primary Care Physician: Rolm GalaGrandis, Heidi Other Clinician: Referring Physician: Rolm GalaGrandis, Heidi Treating Physician/Extender: Elayne SnarePARKER, Willis Kuipers Weeks in Treatment: 9 Debridement Performed for Wound #1 Right Calcaneus Assessment: Performed By: Physician Ardath SaxPARKER, Celesta Funderburk, MD Debridement: Debridement Pre-procedure Yes - 15:16 Verification/Time Out Taken: Start Time: 15:16 Pain Control: Lidocaine 4% Topical Solution Level: Skin/Subcutaneous Tissue Total Area Debrided (L x 1.2 (cm) x 1.2 (cm) = 1.44 (cm) W): Tissue and other Viable, Non-Viable, Eschar, Fibrin/Slough, Skin, Subcutaneous material debrided: Instrument: Curette Bleeding: Minimum Hemostasis Achieved: Pressure End Time: 15:20 Procedural Pain: 0 Post Procedural Pain: 0 Response to Treatment: Procedure was tolerated well Post Debridement Measurements of Total  Wound Length: (cm) 1.2 Stage: Category/Stage III Width: (cm) 1.2 Depth: (cm) 0.2 Volume: (cm) 0.226 Character of Wound/Ulcer Post Improved Debridement: Severity of Tissue Post Fat layer exposed Debridement: Post Procedure Diagnosis Same as Pre-procedure Electronic Signature(s) Signed: 11/16/2015 3:41:47 PM By: Ardath SaxParker, Shirlette Scarber MD Signed: 11/16/2015 4:07:00 PM By: Curtis Sitesorthy, Joanna Entered By: Curtis Sitesorthy, Joanna on 11/16/2015 15:22:34 [name], Allizon L. ([id number] Mallick) Seebeck, [name] ([id number]) -------------------------------------------------------------------------------- HPI Details Patient Name: [name], [name] Date of Service: 11/16/2015 3:00 PM Medical Record Number: [id number] Patient Account Number: [id number] Date of Birth/Sex: 09/22/1924 (82 y.o. Female) Treating RN: Curtis Sitesorthy, Joanna Primary Care Physician: Rolm GalaGrandis, Heidi Other Clinician: Referring Physician: Rolm GalaGrandis, Heidi Treating Physician/Extender: Elayne SnarePARKER, Jaiden Wahab Weeks in Treatment: 9 History of Present Illness Location: right heel Quality: Patient reports experiencing a dull pain to affected area(s). Severity: Patient states wound are getting worse. Duration: Patient has had the wound for < 8 weeks prior to presenting for treatment Timing: Pain in wound is Intermittent (comes and goes Context: The wound occurred when the patient the hip fracture and was in traction for a few days while in hospital Modifying Factors: Other treatment(s) tried include:local care symptomatic treatment Associated Signs and Symptoms: Patient reports having increase discharge. HPI Description: 82 year old patient has been referred to as by her PCP for a pressure ulcer of the right heel. his had this for about 8 weeks Has been treated with duoderm. Has been there for a while it's getting more painful and larger and she also was seen for a recent urinary tract infection. She was given ciprofloxacin for this and then changed on to  nitrofurantoin. She has a past medical history of rheumatic fever, hyperlipidemia, hypertension, scoliosis and osteopenia. never been a smoker. 10/05/2015 -- x-ray of the right heel -- IMPRESSION:No osseous finding of significance. Abnormal appearance of the soft tissues posterior to the heel which could be due to infection or trauma. I do not see an intact Achilles tendon shadow. Electronic Signature(s) Signed: 11/16/2015 3:41:47 PM By: Ardath SaxParker, Raimi Guillermo MD Entered By: Ardath SaxParker, Adell Panek on 11/16/2015  15:09:04 MARKEISHA, MANCIAS ([id number]) -------------------------------------------------------------------------------- Physical Exam Details Patient Name: [name] Date of Service: 11/16/2015 3:00 PM Medical Record Number: [id number] Patient Account Number: [id number] Date of Birth/Sex: [birth year] (82 y.o. Female) Treating RN: Curtis Sites Primary Care Physician: Rolm Gala Other Clinician: Referring Physician: Rolm Gala Treating Physician/Extender: Elayne Snare in Treatment: 9 Electronic Signature(s) Signed: 11/16/2015 3:41:47 PM By: Ardath Sax MD Entered By: Ardath Sax on 11/16/2015 15:09:10 [name] ([id number]) -------------------------------------------------------------------------------- Physician Orders Details Patient Name: [name] Date of Service: 11/16/2015 3:00 PM Medical Record Number: [id number] Patient Account Number: [id number] Date of Birth/Sex: 11-Jun-1924 (82 y.o. Female) Treating RN: Curtis Sites Primary Care Physician: Rolm Gala Other Clinician: Referring Physician: Rolm Gala Treating Physician/Extender: Elayne Snare in Treatment: 9 Verbal / Phone Orders: Yes Clinician: Curtis Sites Read Back and Verified: Yes Diagnosis Coding ICD-10 Coding Code Description L89.613 Pressure ulcer of right heel, stage 3 E44.1 Mild protein-calorie malnutrition Wound Cleansing Wound #1 Right Calcaneus o Cleanse  wound with mild soap and water o May Shower, gently pat wound dry prior to applying new dressing. o May shower with protection. Anesthetic Wound #1 Right Calcaneus o Topical Lidocaine 4% cream applied to wound bed prior to debridement - In Clinic Primary Wound Dressing Wound #1 Right Calcaneus o Prisma Ag Secondary Dressing Wound #1 Right Calcaneus o Gauze and Kerlix/Conform - netting Dressing Change Frequency Wound #1 Right Calcaneus o Three times weekly - For Home Health Specifics Follow-up Appointments Wound #1 Right Calcaneus o Return Appointment in 1 week. Off-Loading Wound #1 Right Calcaneus o Heel suspension boot to: [name], Veleria L. ([id number]) Additional Orders / Instructions Wound #1 Right Calcaneus o Increase protein intake. o Activity as tolerated o Other: - Include these vitamins in your diet, MVI, ZINC, Vit C, Vit A Home Health Wound #1 Right Calcaneus o Continue Home Health Visits - [facility] o Home Health Nurse may visit PRN to address patientos wound care needs. o FACE TO FACE ENCOUNTER: MEDICARE and MEDICAID PATIENTS: I certify that this patient is under my care and that I had a face-to-face encounter that meets the physician face-to-face encounter requirements with this patient on this date. The encounter with the patient was in whole or in part for the following MEDICAL CONDITION: (primary reason for Home Healthcare) MEDICAL NECESSITY: I certify, that based on my findings, NURSING services are a medically necessary home health service. HOME BOUND STATUS: I certify that my clinical findings support that this patient is homebound (i.e., Due to illness or injury, pt requires aid of supportive devices such as crutches, cane, wheelchairs, walkers, the use of special transportation or the assistance of another person to leave their place of residence. There is a normal inability to leave the home and doing so requires  considerable and taxing effort. Other absences are for medical reasons / religious services and are infrequent or of short duration when for other reasons). o If current dressing causes regression in wound condition, may D/C ordered dressing product/s and apply Normal Saline Moist Dressing daily until next Wound Healing Center / Other MD appointment. Notify Wound Healing Center of regression in wound condition at [phone]. o Please direct any NON-WOUND related issues/requests for orders to patient's Primary Care Physician Electronic Signature(s) Signed: 11/16/2015 3:41:47 PM By: Ardath Sax MD Signed: 11/16/2015 4:07:00 PM By: Curtis Sites Entered By: Curtis Sites on 11/16/2015 15:23:12 Gurry, Arlisha Elbert Ewings ([id number]) -------------------------------------------------------------------------------- Problem List Details Patient Name: [name] Date of Service: 11/16/2015  3:00 PM Medical Record Number: [id number] Patient Account Number: [id number] Date of Birth/Sex: 24-Dec-1924 (82 y.o. Female) Treating RN: Curtis Sites Primary Care Physician: Rolm Gala Other Clinician: Referring Physician: Rolm Gala Treating Physician/Extender: Elayne Snare in Treatment: 9 Active Problems ICD-10 Encounter Code Description Active Date Diagnosis L89.613 Pressure ulcer of right heel, stage 3 09/14/2015 Yes E44.1 Mild protein-calorie malnutrition 09/14/2015 Yes Inactive Problems Resolved Problems Electronic Signature(s) Signed: 11/16/2015 3:41:47 PM By: Ardath Sax MD Entered By: Ardath Sax on 11/16/2015 15:08:31 [name] ([id number]) -------------------------------------------------------------------------------- Progress Note Details Patient Name: [name] Date of Service: 11/16/2015 3:00 PM Medical Record Number: [id number] Patient Account Number: [id number] Date of Birth/Sex: 1924/08/11 (82 y.o. Female) Treating RN: Curtis Sites Primary  Care Physician: Rolm Gala Other Clinician: Referring Physician: Rolm Gala Treating Physician/Extender: Elayne Snare in Treatment: 9 Subjective Chief Complaint Information obtained from Patient Patient is at the clinic for treatment of an open pressure ulcer right heel which she's had for about 8 weeks History of Present Illness (HPI) The following HPI elements were documented for the patient's wound: Location: right heel Quality: Patient reports experiencing a dull pain to affected area(s). Severity: Patient states wound are getting worse. Duration: Patient has had the wound for < 8 weeks prior to presenting for treatment Timing: Pain in wound is Intermittent (comes and goes Context: The wound occurred when the patient the hip fracture and was in traction for a few days while in hospital Modifying Factors: Other treatment(s) tried include:local care symptomatic treatment Associated Signs and Symptoms: Patient reports having increase discharge. 82 year old patient has been referred to as by her PCP for a pressure ulcer of the right heel. his had this for about 8 weeks Has been treated with duoderm. Has been there for a while it's getting more painful and larger and she also was seen for a recent urinary tract infection. She was given ciprofloxacin for this and then changed on to nitrofurantoin. She has a past medical history of rheumatic fever, hyperlipidemia, hypertension, scoliosis and osteopenia. never been a smoker. 10/05/2015 -- x-ray of the right heel -- IMPRESSION:No osseous finding of significance. Abnormal appearance of the soft tissues posterior to the heel which could be due to infection or trauma. I do not see an intact Achilles tendon shadow. Objective Pressure ulcer right heel. Debrided callous and dressed with collagen. Change daily Swenson, Audine L. ([id number]) Assessment Active Problems ICD-10 L89.613 - Pressure ulcer of right heel, stage 3 E44.1 -  Mild protein-calorie malnutrition Electronic Signature(s) Signed: 11/16/2015 3:41:47 PM By: Ardath Sax MD Entered By: Ardath Sax on 11/16/2015 15:34:57 [name] ([id number]) -------------------------------------------------------------------------------- SuperBill Details Patient Name: [name] Date of Service: 11/16/2015 Medical Record Number: [id number] Patient Account Number: [id number] Date of Birth/Sex: 06-17-1924 (82 y.o. Female) Treating RN: Curtis Sites Primary Care Physician: Rolm Gala Other Clinician: Referring Physician: Rolm Gala Treating Physician/Extender: Elayne Snare in Treatment: 9 Diagnosis Coding ICD-10 Codes Code Description [phone] Pressure ulcer of right heel, stage 3 E44.1 Mild protein-calorie malnutrition Facility Procedures CPT4 Code: [id number] Description: 11042 - DEB SUBQ TISSUE 20 SQ CM/< ICD-10 Description Diagnosis L89.613 Pressure ulcer of right heel, stage 3 E44.1 Mild protein-calorie malnutrition Modifier: Quantity: 1 Physician Procedures CPT4 Code: [id number] Description: 11042 - WC PHYS SUBQ TISS 20 SQ CM ICD-10 Description Diagnosis L89.613 Pressure ulcer of right heel, stage 3 E44.1 Mild protein-calorie malnutrition Modifier: Quantity: 1 Electronic Signature(s) Signed: 11/16/2015 4:04:38 PM By: Curtis Sites Signed: 11/16/2015 4:07:23 PM By: Ardath Sax MD  Previous Signature: 11/16/2015 4:03:55 PM Version By: Curtis Sites Previous Signature: 11/16/2015 3:41:47 PM Version By: Ardath Sax MD Entered By: Curtis Sites on 11/16/2015 16:04:38

## 2015-11-23 ENCOUNTER — Encounter: Payer: Medicare Other | Admitting: Surgery

## 2015-11-23 DIAGNOSIS — L89613 Pressure ulcer of right heel, stage 3: Secondary | ICD-10-CM | POA: Diagnosis not present

## 2015-11-24 NOTE — Progress Notes (Signed)
Colleen QualeSLEY, Breiona L. ([id number]) Visit Report for 11/23/2015 Arrival Information Details Patient Name: Colleen QualeSLEY, [name] Date of Service: 11/23/2015 2:15 PM Medical Record Number: [id number] Patient Account Number: [id number] Date of Birth/Sex: 05/02/1924 (82 y.o. Female) Treating RN: Curtis Sitesorthy, Joanna Primary Care Physician: Rolm GalaGrandis, Heidi Other Clinician: Referring Physician: Rolm GalaGrandis, Heidi Treating Physician/Extender: Rudene ReBritto, Errol Weeks in Treatment: 10 Visit Information History Since Last Visit Added or deleted any medications: No Patient Arrived: Wheel Chair Any new allergies or adverse reactions: No Arrival Time: 14:23 Had a fall or experienced change in No Accompanied By: dtr activities of daily living that may affect Transfer Assistance: Manual risk of falls: Patient Identification Verified: Yes Signs or symptoms of abuse/neglect since last No Secondary Verification Process Yes visito Completed: Hospitalized since last visit: No Patient Requires Transmission- No Pain Present Now: No Based Precautions: Patient Has Alerts: Yes Patient Alerts: Patient on Blood Thinner eliquis Electronic Signature(s) Signed: 11/23/2015 5:26:42 PM By: Curtis Sitesorthy, Joanna Entered By: Curtis Sitesorthy, Joanna on 11/23/2015 14:26:07 Colleen QualeISLEY, [name] ([id number]) -------------------------------------------------------------------------------- Clinic Level of Care Assessment Details Patient Name: Colleen QualeISLEY, [name] Date of Service: 11/23/2015 2:15 PM Medical Record Number: [id number] Patient Account Number: [id number] Date of Birth/Sex: 07/18/1924 (82 y.o. Female) Treating RN: Afful, RN, BSN, [place] Primary Care Physician: Rolm GalaGrandis, Heidi Other Clinician: Referring Physician: Rolm GalaGrandis, Heidi Treating Physician/Extender: Rudene ReBritto, Errol Weeks in Treatment: 10 Clinic Level of Care Assessment Items TOOL 4 Quantity Score []  - Use when only an EandM is performed on FOLLOW-UP visit 0 ASSESSMENTS - Nursing Assessment /  Reassessment X - Reassessment of Co-morbidities (includes updates in patient status) 1 10 X - Reassessment of Adherence to Treatment Plan 1 5 ASSESSMENTS - Wound and Skin Assessment / Reassessment X - Simple Wound Assessment / Reassessment - one wound 1 5 []  - Complex Wound Assessment / Reassessment - multiple wounds 0 []  - Dermatologic / Skin Assessment (not related to wound area) 0 ASSESSMENTS - Focused Assessment []  - Circumferential Edema Measurements - multi extremities 0 []  - Nutritional Assessment / Counseling / Intervention 0 X - Lower Extremity Assessment (monofilament, tuning fork, pulses) 1 5 []  - Peripheral Arterial Disease Assessment (using hand held doppler) 0 ASSESSMENTS - Ostomy and/or Continence Assessment and Care []  - Incontinence Assessment and Management 0 []  - Ostomy Care Assessment and Management (repouching, etc.) 0 PROCESS - Coordination of Care X - Simple Patient / Family Education for ongoing care 1 15 []  - Complex (extensive) Patient / Family Education for ongoing care 0 []  - Staff obtains ChiropractorConsents, Records, Test Results / Process Orders 0 []  - Staff telephones HHA, Nursing Homes / Clarify orders / etc 0 []  - Routine Transfer to another Facility (non-emergent condition) 0 Cafarella, [name] ([id number]) []  - Routine Hospital Admission (non-emergent condition) 0 []  - New Admissions / Manufacturing engineernsurance Authorizations / Ordering NPWT, Apligraf, etc. 0 []  - Emergency Hospital Admission (emergent condition) 0 []  - Simple Discharge Coordination 0 []  - Complex (extensive) Discharge Coordination 0 PROCESS - Special Needs []  - Pediatric / Minor Patient Management 0 []  - Isolation Patient Management 0 []  - Hearing / Language / Visual special needs 0 []  - Assessment of Community assistance (transportation, D/C planning, etc.) 0 []  - Additional assistance / Altered mentation 0 []  - Support Surface(s) Assessment (bed, cushion, seat, etc.) 0 INTERVENTIONS - Wound Cleansing /  Measurement X - Simple Wound Cleansing - one wound 1 5 []  - Complex Wound Cleansing - multiple wounds 0 X - Wound Imaging (photographs - any number of wounds) 1  5 []  - Wound Tracing (instead of photographs) 0 X - Simple Wound Measurement - one wound 1 5 []  - Complex Wound Measurement - multiple wounds 0 INTERVENTIONS - Wound Dressings X - Small Wound Dressing one or multiple wounds 1 10 []  - Medium Wound Dressing one or multiple wounds 0 []  - Large Wound Dressing one or multiple wounds 0 []  - Application of Medications - topical 0 []  - Application of Medications - injection 0 INTERVENTIONS - Miscellaneous []  - External ear exam 0 Griep, [name] ([id number]) []  - Specimen Collection (cultures, biopsies, blood, body fluids, etc.) 0 []  - Specimen(s) / Culture(s) sent or taken to Lab for analysis 0 []  - Patient Transfer (multiple staff / Michiel Sites Lift / Similar devices) 0 []  - Simple Staple / Suture removal (25 or less) 0 []  - Complex Staple / Suture removal (26 or more) 0 []  - Hypo / Hyperglycemic Management (close monitor of Blood Glucose) 0 []  - Ankle / Brachial Index (ABI) - do not check if billed separately 0 X - Vital Signs 1 5 Has the patient been seen at the hospital within the last three years: Yes Total Score: 70 Level Of Care: New/Established - Level 2 Electronic Signature(s) Signed: 11/23/2015 5:42:32 PM By: Elpidio Eric BSN, RN Entered By: Elpidio Eric on 11/23/2015 17:39:30 Colleen [name] ([id number]) -------------------------------------------------------------------------------- Encounter Discharge Information Details Patient Name: Colleen [name] Date of Service: 11/23/2015 2:15 PM Medical Record Number: [id number] Patient Account Number: [id number] Date of Birth/Sex: 12/18/1924 (82 y.o. Female) Treating RN: Curtis Sites Primary Care Physician: Rolm Gala Other Clinician: Referring Physician: Rolm Gala Treating Physician/Extender: Rudene Re in  Treatment: 10 Encounter Discharge Information Items Discharge Pain Level: 0 Discharge Condition: Stable Ambulatory Status: Wheelchair Discharge Destination: Home Transportation: Private Auto Accompanied By: dtr Schedule Follow-up Appointment: Yes Medication Reconciliation completed and provided to Patient/Care No Tarell Schollmeyer: Provided on Clinical Summary of Care: 11/23/2015 Form Type Recipient Paper Patient DI Electronic Signature(s) Signed: 11/23/2015 3:12:59 PM By: Gwenlyn Perking Entered By: Gwenlyn Perking on 11/23/2015 15:12:59 Colleen [name] ([id number]) -------------------------------------------------------------------------------- Lower Extremity Assessment Details Patient Name: Colleen [name] Date of Service: 11/23/2015 2:15 PM Medical Record Number: [id number] Patient Account Number: [id number] Date of Birth/Sex: [birth year] (82 y.o. Female) Treating RN: Curtis Sites Primary Care Physician: Rolm Gala Other Clinician: Referring Physician: Rolm Gala Treating Physician/Extender: Rudene Re in Treatment: 10 Edema Assessment Assessed: [Left: No] [Right: No] Edema: [Left: N] [Right: o] Vascular Assessment Pulses: Posterior Tibial Dorsalis Pedis Palpable: [Right:Yes] Extremity colors, hair growth, and conditions: Extremity Color: [Right:Normal] Hair Growth on Extremity: [Right:Yes] Temperature of Extremity: [Right:Warm] Capillary Refill: [Right:< 3 seconds] Electronic Signature(s) Signed: 11/23/2015 5:26:42 PM By: Curtis Sites Entered By: Curtis Sites on 11/23/2015 14:34:21 Rubalcava, Yarely Elbert Ewings ([id number]) -------------------------------------------------------------------------------- Multi Wound Chart Details Patient Name: Colleen [name] Date of Service: 11/23/2015 2:15 PM Medical Record Number: [id number] Patient Account Number: [id number] Date of Birth/Sex: Feb 10, 1924 (82 y.o. Female) Treating RN: Clover Mealy, RN, BSN, [place] Sink Primary Care  Physician: Rolm Gala Other Clinician: Referring Physician: Rolm Gala Treating Physician/Extender: Rudene Re in Treatment: 10 Vital Signs Height(in): 59 Pulse(bpm): 83 Weight(lbs): 108 Blood Pressure 132/92 (mmHg): Body Mass Index(BMI): 22 Temperature(F): Respiratory Rate 16 (breaths/min): Photos: [N/A:N/A] Wound Location: Right Calcaneus N/A N/A Wounding Event: Pressure Injury N/A N/A Primary Etiology: Pressure Ulcer N/A N/A Comorbid History: Deep Vein Thrombosis, N/A N/A Hypertension, Dementia Date Acquired: 07/14/2015 N/A N/A Weeks of Treatment: 10 N/A N/A Wound Status: Open N/A N/A Measurements L x W x  D 1x1.1x0.1 N/A N/A (cm) Area (cm) : 0.864 N/A N/A Volume (cm) : 0.086 N/A N/A % Reduction in Area: 84.30% N/A N/A % Reduction in Volume: 92.20% N/A N/A Classification: Category/Stage III N/A N/A Exudate Amount: Large N/A N/A Exudate Type: Serous N/A N/A Exudate Color: amber N/A N/A Wound Margin: Flat and Intact N/A N/A Granulation Amount: Large (67-100%) N/A N/A Granulation Quality: Red N/A N/A Necrotic Amount: Small (1-33%) N/A N/A Sedeno, Laberta L. ([id number]) Necrotic Tissue: Eschar, Adherent Slough N/A N/A Exposed Structures: Fascia: No N/A N/A Fat: No Tendon: No Muscle: No Joint: No Bone: No Limited to Skin Breakdown Epithelialization: None N/A N/A Periwound Skin Texture: Edema: No N/A N/A Excoriation: No Induration: No Callus: No Crepitus: No Fluctuance: No Friable: No Rash: No Scarring: No Periwound Skin Dry/Scaly: Yes N/A N/A Moisture: Maceration: No Moist: No Periwound Skin Color: Erythema: Yes N/A N/A Atrophie Blanche: No Cyanosis: No Ecchymosis: No Hemosiderin Staining: No Mottled: No Pallor: No Rubor: No Erythema Location: Circumferential N/A N/A Temperature: No Abnormality N/A N/A Tenderness on Yes N/A N/A Palpation: Wound Preparation: Ulcer Cleansing: N/A N/A Rinsed/Irrigated with Saline Topical  Anesthetic Applied: Other: lidocaine 4% Treatment Notes Electronic Signature(s) Signed: 11/23/2015 5:42:32 PM By: Elpidio Eric BSN, RN Entered By: Elpidio Eric on 11/23/2015 15:03:35 Colleen [name] ([id number]) -------------------------------------------------------------------------------- Multi-Disciplinary Care Plan Details Patient Name: Colleen [name] Date of Service: 11/23/2015 2:15 PM Medical Record Number: [id number] Patient Account Number: [id number] Date of Birth/Sex: Mar 18, 1924 (82 y.o. Female) Treating RN: Afful, RN, BSN, [place] Sink Primary Care Physician: Rolm Gala Other Clinician: Referring Physician: Rolm Gala Treating Physician/Extender: Rudene Re in Treatment: 10 Active Inactive Abuse / Safety / Falls / Self Care Management Nursing Diagnoses: Impaired home maintenance Impaired physical mobility Potential for falls Self care deficit: actual or potential Goals: Patient will remain injury free Date Initiated: 09/14/2015 Goal Status: Active Patient/caregiver will verbalize understanding of skin care regimen Date Initiated: 09/14/2015 Goal Status: Active Patient/caregiver will verbalize/demonstrate measure taken to improve self care Date Initiated: 09/14/2015 Goal Status: Active Patient/caregiver will verbalize/demonstrate measures taken to improve the patient's personal safety Date Initiated: 09/14/2015 Goal Status: Active Patient/caregiver will verbalize/demonstrate measures taken to prevent injury and/or falls Date Initiated: 09/14/2015 Goal Status: Active Patient/caregiver will verbalize/demonstrate understanding of what to do in case of emergency Date Initiated: 09/14/2015 Goal Status: Active Interventions: Assess fall risk on admission and as needed Assess: immobility, friction, shearing, incontinence upon admission and as needed Assess impairment of mobility on admission and as needed per policy Assess self care needs on admission and as  needed Provide education on basic hygiene Provide education on personal and home safety NIKKA, HAKIMIAN ([id number]) Provide education on safe transfers Notes: Orientation to the Wound Care Program Nursing Diagnoses: Knowledge deficit related to the wound healing center program Goals: Patient/caregiver will verbalize understanding of the Wound Healing Center Program Date Initiated: 09/14/2015 Goal Status: Active Interventions: Provide education on orientation to the wound center Notes: Pressure Nursing Diagnoses: Knowledge deficit related to causes and risk factors for pressure ulcer development Knowledge deficit related to management of pressures ulcers Potential for impaired tissue integrity related to pressure, friction, moisture, and shear Goals: Patient will remain free from development of additional pressure ulcers Date Initiated: 09/14/2015 Goal Status: Active Patient will remain free of pressure ulcers Date Initiated: 09/14/2015 Goal Status: Active Patient/caregiver will verbalize risk factors for pressure ulcer development Date Initiated: 09/14/2015 Goal Status: Active Patient/caregiver will verbalize understanding of pressure ulcer management Date Initiated: 09/14/2015 Goal Status: Active Interventions:  Assess: immobility, friction, shearing, incontinence upon admission and as needed Assess offloading mechanisms upon admission and as needed Assess potential for pressure ulcer upon admission and as needed Provide education on pressure ulcers Treatment Activities: NAVAYA, WIATREK ([id number]) Patient referred for pressure reduction/relief devices : 09/14/2015 Patient referred for seating evaluation to ensure proper offloading : 09/14/2015 Pressure reduction/relief device ordered : 09/14/2015 Notes: Wound/Skin Impairment Nursing Diagnoses: Impaired tissue integrity Knowledge deficit related to ulceration/compromised skin integrity Goals: Patient/caregiver will  verbalize understanding of skin care regimen Date Initiated: 09/14/2015 Goal Status: Active Ulcer/skin breakdown will have a volume reduction of 30% by week 4 Date Initiated: 09/14/2015 Goal Status: Active Ulcer/skin breakdown will have a volume reduction of 50% by week 8 Date Initiated: 09/14/2015 Goal Status: Active Ulcer/skin breakdown will have a volume reduction of 80% by week 12 Date Initiated: 09/14/2015 Goal Status: Active Ulcer/skin breakdown will heal within 14 weeks Date Initiated: 09/14/2015 Goal Status: Active Interventions: Assess patient/caregiver ability to obtain necessary supplies Assess patient/caregiver ability to perform ulcer/skin care regimen upon admission and as needed Assess ulceration(s) every visit Provide education on ulcer and skin care Treatment Activities: Referred to DME Carlis Blanchard for dressing supplies : 09/14/2015 Skin care regimen initiated : 09/14/2015 Topical wound management initiated : 09/14/2015 Notes: Electronic Signature(s) Signed: 11/23/2015 5:42:32 PM By: Elpidio Eric BSN, RN Entered By: Elpidio Eric on 11/23/2015 15:03:22 Colleen [name] ([id number] Mallick, Theora Gianotti ([id number]) -------------------------------------------------------------------------------- Pain Assessment Details Patient Name: Colleen [name] Date of Service: 11/23/2015 2:15 PM Medical Record Number: [id number] Patient Account Number: [id number] Date of Birth/Sex: [birth year] (82 y.o. Female) Treating RN: Curtis Sites Primary Care Physician: Rolm Gala Other Clinician: Referring Physician: Rolm Gala Treating Physician/Extender: Rudene Re in Treatment: 10 Active Problems Location of Pain Severity and Description of Pain Patient Has Paino No Site Locations Pain Management and Medication Current Pain Management: Notes Topical or injectable lidocaine is offered to patient for acute pain when surgical debridement is performed. If needed, Patient is  instructed to use over the counter pain medication for the following 24-48 hours after debridement. Wound care MDs do not prescribed pain medications. Patient has chronic pain or uncontrolled pain. Patient has been instructed to make an appointment with their Primary Care Physician for pain management. Electronic Signature(s) Signed: 11/23/2015 5:26:42 PM By: Curtis Sites Entered By: Curtis Sites on 11/23/2015 14:26:19 Colleen [name] ([id number]) -------------------------------------------------------------------------------- Patient/Caregiver Education Details Patient Name: Colleen [name] Date of Service: 11/23/2015 2:15 PM Medical Record Number: [id number] Patient Account Number: [id number] Date of Birth/Gender: 04-09-1924 (82 y.o. Female) Treating RN: Curtis Sites Primary Care Physician: Rolm Gala Other Clinician: Referring Physician: Rolm Gala Treating Physician/Extender: Rudene Re in Treatment: 10 Education Assessment Education Provided To: Patient Education Topics Provided Wound/Skin Impairment: Handouts: Other: wound care to continue as ordered Methods: Demonstration, Explain/Verbal Responses: State content correctly Electronic Signature(s) Signed: 11/23/2015 5:26:42 PM By: Curtis Sites Entered By: Curtis Sites on 11/23/2015 15:12:38 Colleen [name] ([id number]) -------------------------------------------------------------------------------- Wound Assessment Details Patient Name: Colleen [name] Date of Service: 11/23/2015 2:15 PM Medical Record Number: [id number] Patient Account Number: [id number] Date of Birth/Sex: 18-Sep-1924 (82 y.o. Female) Treating RN: Curtis Sites Primary Care Physician: Rolm Gala Other Clinician: Referring Physician: Rolm Gala Treating Physician/Extender: Rudene Re in Treatment: 10 Wound Status Wound Number: 1 Primary Pressure Ulcer Etiology: Wound Location: Right Calcaneus Wound  Status: Open Wounding Event: Pressure Injury Comorbid Deep Vein Thrombosis, Date Acquired: 07/14/2015 History: Hypertension, Dementia Weeks Of Treatment: 10 Clustered Wound: No Photos Wound  Measurements Length: (cm) 1 Width: (cm) 1.1 Depth: (cm) 0.1 Area: (cm) 0.864 Volume: (cm) 0.086 % Reduction in Area: 84.3% % Reduction in Volume: 92.2% Epithelialization: None Tunneling: No Undermining: No Wound Description Classification: Category/Stage III Wound Margin: Flat and Intact Exudate Amount: Large Exudate Type: Serous Exudate Color: amber Foul Odor After Cleansing: No Wound Bed Granulation Amount: Large (67-100%) Exposed Structure Granulation Quality: Red Fascia Exposed: No Necrotic Amount: Small (1-33%) Fat Layer Exposed: No Necrotic Quality: Eschar, Adherent Slough Tendon Exposed: No Muscle Exposed: No Oehlert, Rosely L. ([id number]) Joint Exposed: No Bone Exposed: No Limited to Skin Breakdown Periwound Skin Texture Texture Color No Abnormalities Noted: No No Abnormalities Noted: No Callus: No Atrophie Blanche: No Crepitus: No Cyanosis: No Excoriation: No Ecchymosis: No Fluctuance: No Erythema: Yes Friable: No Erythema Location: Circumferential Induration: No Hemosiderin Staining: No Localized Edema: No Mottled: No Rash: No Pallor: No Scarring: No Rubor: No Moisture Temperature / Pain No Abnormalities Noted: No Temperature: No Abnormality Dry / Scaly: Yes Tenderness on Palpation: Yes Maceration: No Moist: No Wound Preparation Ulcer Cleansing: Rinsed/Irrigated with Saline Topical Anesthetic Applied: Other: lidocaine 4%, Treatment Notes Wound #1 (Right Calcaneus) 1. Cleansed with: Clean wound with Normal Saline 2. Anesthetic Topical Lidocaine 4% cream to wound bed prior to debridement 4. Dressing Applied: Prisma Ag 5. Secondary Dressing Applied Guaze, ABD and kerlix/Conform 7. Secured with Tape Notes netting Electronic  Signature(s) Signed: 11/23/2015 5:26:42 PM By: Curtis Sites Entered By: Curtis Sites on 11/23/2015 14:33:45 Colleen [name] ([id number]) -------------------------------------------------------------------------------- Vitals Details Patient Name: Colleen [name] Date of Service: 11/23/2015 2:15 PM Medical Record Number: [id number] Patient Account Number: [id number] Date of Birth/Sex: 14-Jul-1924 (82 y.o. Female) Treating RN: Curtis Sites Primary Care Physician: Rolm Gala Other Clinician: Referring Physician: Rolm Gala Treating Physician/Extender: Rudene Re in Treatment: 10 Vital Signs Time Taken: 14:31 Pulse (bpm): 83 Height (in): 59 Respiratory Rate (breaths/min): 16 Weight (lbs): 108 Blood Pressure (mmHg): 132/92 Body Mass Index (BMI): 21.8 Reference Range: 80 - 120 mg / dl Electronic Signature(s) Signed: 11/23/2015 5:26:42 PM By: Curtis Sites Entered By: Curtis Sites on 11/23/2015 14:31:39

## 2015-11-24 NOTE — Progress Notes (Signed)
LAPORSHA, GREALISH ([id number]) Visit Report for 11/23/2015 Chief Complaint Document Details Patient Name: [name], [name] Date of Service: 11/23/2015 2:15 PM Medical Record Number: [id number] Patient Account Number: [id number] Date of Birth/Sex: [birth year] (82 y.o. Female) Treating RN: Curtis Sites Primary Care Physician: Rolm Gala Other Clinician: Referring Physician: Rolm Gala Treating Physician/Extender: Rudene Re in Treatment: 10 Information Obtained from: Patient Chief Complaint Patient is at the clinic for treatment of an open pressure ulcer right heel which she's had for about 8 weeks Electronic Signature(s) Signed: 11/23/2015 3:15:54 PM By: Evlyn Kanner MD, FACS Entered By: Evlyn Kanner on 11/23/2015 15:15:54 [name] ([id number]) -------------------------------------------------------------------------------- HPI Details Patient Name: [name] Date of Service: 11/23/2015 2:15 PM Medical Record Number: [id number] Patient Account Number: [id number] Date of Birth/Sex: [birth year] (82 y.o. Female) Treating RN: Curtis Sites Primary Care Physician: Rolm Gala Other Clinician: Referring Physician: Rolm Gala Treating Physician/Extender: Rudene Re in Treatment: 10 History of Present Illness Location: right heel Quality: Patient reports experiencing a dull pain to affected area(s). Severity: Patient states wound are getting worse. Duration: Patient has had the wound for < 8 weeks prior to presenting for treatment Timing: Pain in wound is Intermittent (comes and goes Context: The wound occurred when the patient the hip fracture and was in traction for a few days while in hospital Modifying Factors: Other treatment(s) tried include:local care symptomatic treatment Associated Signs and Symptoms: Patient reports having increase discharge. HPI Description: 82 year old patient has been referred to as by her PCP for a pressure  ulcer of the right heel. his had this for about 8 weeks Has been treated with duoderm. Has been there for a while it's getting more painful and larger and she also was seen for a recent urinary tract infection. She was given ciprofloxacin for this and then changed on to nitrofurantoin. She has a past medical history of rheumatic fever, hyperlipidemia, hypertension, scoliosis and osteopenia. never been a smoker. 10/05/2015 -- x-ray of the right heel -- IMPRESSION:No osseous finding of significance. Abnormal appearance of the soft tissues posterior to the heel which could be due to infection or trauma. I do not see an intact Achilles tendon shadow. Electronic Signature(s) Signed: 11/23/2015 3:15:59 PM By: Evlyn Kanner MD, FACS Entered By: Evlyn Kanner on 11/23/2015 15:15:59 [name] ([id number]) -------------------------------------------------------------------------------- Physical Exam Details Patient Name: [name] Date of Service: 11/23/2015 2:15 PM Medical Record Number: [id number] Patient Account Number: [id number] Date of Birth/Sex: [birth year] (82 y.o. Female) Treating RN: Curtis Sites Primary Care Physician: Rolm Gala Other Clinician: Referring Physician: Rolm Gala Treating Physician/Extender: Rudene Re in Treatment: 10 Constitutional . Pulse regular. Respirations normal and unlabored. Afebrile. . Eyes Nonicteric. Reactive to light. Ears, Nose, Mouth, and Throat Lips, teeth, and gums WNL.Marland Kitchen Moist mucosa without lesions. Neck supple and nontender. No palpable supraclavicular or cervical adenopathy. Normal sized without goiter. Respiratory WNL. No retractions.. Cardiovascular Pedal Pulses WNL. No clubbing, cyanosis or edema. Lymphatic No adneopathy. No adenopathy. No adenopathy. Musculoskeletal Adexa without tenderness or enlargement.. Digits and nails w/o clubbing, cyanosis, infection, petechiae, ischemia, or inflammatory  conditions.. Integumentary (Hair, Skin) No suspicious lesions. No crepitus or fluctuance. No peri-wound warmth or erythema. No masses.Marland Kitchen Psychiatric Judgement and insight Intact.. No evidence of depression, anxiety, or agitation.. Notes the wound on the left calcaneum is looking fairly clean and after debriding it with moist saline gauze lot of the superficial slough came out and there is healthy granulation tissue. Electronic Signature(s) Signed: 11/23/2015 3:16:40 PM By:  Evlyn Kanner MD, FACS Entered By: Evlyn Kanner on 11/23/2015 15:16:39 [name] ([id number]) -------------------------------------------------------------------------------- Physician Orders Details Patient Name: [name] Date of Service: 11/23/2015 2:15 PM Medical Record Number: [id number] Patient Account Number: [id number] Date of Birth/Sex: 10-May-1924 (82 y.o. Female) Treating RN: Afful, RN, BSN, Relampago Sink Primary Care Physician: Rolm Gala Other Clinician: Referring Physician: Rolm Gala Treating Physician/Extender: Rudene Re in Treatment: 10 Verbal / Phone Orders: Yes Clinician: Afful, RN, BSN, Rita Read Back and Verified: Yes Diagnosis Coding Wound Cleansing Wound #1 Right Calcaneus o Cleanse wound with mild soap and water o May Shower, gently pat wound dry prior to applying new dressing. o May shower with protection. Anesthetic Wound #1 Right Calcaneus o Topical Lidocaine 4% cream applied to wound bed prior to debridement - In Clinic Primary Wound Dressing Wound #1 Right Calcaneus o Prisma Ag Secondary Dressing Wound #1 Right Calcaneus o Gauze and Kerlix/Conform - netting Dressing Change Frequency Wound #1 Right Calcaneus o Three times weekly - For Home Health Specifics Follow-up Appointments Wound #1 Right Calcaneus o Return Appointment in 1 week. Off-Loading Wound #1 Right Calcaneus o Heel suspension boot to: - [employer] Additional Orders /  Instructions Wound #1 Right Calcaneus o Increase protein intake. o Activity as tolerated o Other: - Include these vitamins in your diet, MVI, ZINC, Vit C, Vit A [name], [name] ([id number]) Home Health Wound #1 Right Calcaneus o Continue Home Health Visits - [facility] o Home Health Nurse may visit PRN to address patientos wound care needs. o FACE TO FACE ENCOUNTER: MEDICARE and MEDICAID PATIENTS: I certify that this patient is under my care and that I had a face-to-face encounter that meets the physician face-to-face encounter requirements with this patient on this date. The encounter with the patient was in whole or in part for the following MEDICAL CONDITION: (primary reason for Home Healthcare) MEDICAL NECESSITY: I certify, that based on my findings, NURSING services are a medically necessary home health service. HOME BOUND STATUS: I certify that my clinical findings support that this patient is homebound (i.e., Due to illness or injury, pt requires aid of supportive devices such as crutches, cane, wheelchairs, walkers, the use of special transportation or the assistance of another person to leave their place of residence. There is a normal inability to leave the home and doing so requires considerable and taxing effort. Other absences are for medical reasons / religious services and are infrequent or of short duration when for other reasons). o If current dressing causes regression in wound condition, may D/C ordered dressing product/s and apply Normal Saline Moist Dressing daily until next Wound Healing Center / Other MD appointment. Notify Wound Healing Center of regression in wound condition at [phone]. o Please direct any NON-WOUND related issues/requests for orders to patient's Primary Care Physician Notes [employer] insurance for skin subooo EPIFIX Electronic Signature(s) Signed: 11/23/2015 3:58:39 PM By: Evlyn Kanner MD, FACS Signed: 11/23/2015 5:42:32 PM By:  Elpidio Eric BSN, RN Entered By: Elpidio Eric on 11/23/2015 15:05:56 [name] ([id number]) -------------------------------------------------------------------------------- Problem List Details Patient Name: [name] Date of Service: 11/23/2015 2:15 PM Medical Record Number: [id number] Patient Account Number: [id number] Date of Birth/Sex: 06-26-1924 (82 y.o. Female) Treating RN: Huel Coventry Primary Care Physician: Rolm Gala Other Clinician: Referring Physician: Rolm Gala Treating Physician/Extender: Rudene Re in Treatment: 10 Active Problems ICD-10 Encounter Code Description Active Date Diagnosis L89.613 Pressure ulcer of right heel, stage 3 09/14/2015 Yes E44.1 Mild protein-calorie malnutrition 09/14/2015 Yes Inactive  Problems Resolved Problems Electronic Signature(s) Signed: 11/23/2015 3:15:46 PM By: Evlyn Kanner MD, FACS Entered By: Evlyn Kanner on 11/23/2015 15:15:45 [name] ([id number]) -------------------------------------------------------------------------------- Progress Note Details Patient Name: [name] Date of Service: 11/23/2015 2:15 PM Medical Record Number: [id number] Patient Account Number: [id number] Date of Birth/Sex: Mar 19, 1924 (82 y.o. Female) Treating RN: Curtis Sites Primary Care Physician: Rolm Gala Other Clinician: Referring Physician: Rolm Gala Treating Physician/Extender: Rudene Re in Treatment: 10 Subjective Chief Complaint Information obtained from Patient Patient is at the clinic for treatment of an open pressure ulcer right heel which she's had for about 8 weeks History of Present Illness (HPI) The following HPI elements were documented for the patient's wound: Location: right heel Quality: Patient reports experiencing a dull pain to affected area(s). Severity: Patient states wound are getting worse. Duration: Patient has had the wound for < 8 weeks prior to presenting  for treatment Timing: Pain in wound is Intermittent (comes and goes Context: The wound occurred when the patient the hip fracture and was in traction for a few days while in hospital Modifying Factors: Other treatment(s) tried include:local care symptomatic treatment Associated Signs and Symptoms: Patient reports having increase discharge. 82 year old patient has been referred to as by her PCP for a pressure ulcer of the right heel. his had this for about 8 weeks Has been treated with duoderm. Has been there for a while it's getting more painful and larger and she also was seen for a recent urinary tract infection. She was given ciprofloxacin for this and then changed on to nitrofurantoin. She has a past medical history of rheumatic fever, hyperlipidemia, hypertension, scoliosis and osteopenia. never been a smoker. 10/05/2015 -- x-ray of the right heel -- IMPRESSION:No osseous finding of significance. Abnormal appearance of the soft tissues posterior to the heel which could be due to infection or trauma. I do not see an intact Achilles tendon shadow. Objective Constitutional Pulse regular. Respirations normal and unlabored. Afebrile. Vitals Time Taken: 2:31 PM, Height: 59 in, Weight: 108 lbs, BMI: 21.8, Pulse: 83 bpm, Respiratory Rate: 16 [name], [name] ([id number]) breaths/min, Blood Pressure: 132/92 mmHg. Eyes Nonicteric. Reactive to light. Ears, Nose, Mouth, and Throat Lips, teeth, and gums WNL.Marland Kitchen Moist mucosa without lesions. Neck supple and nontender. No palpable supraclavicular or cervical adenopathy. Normal sized without goiter. Respiratory WNL. No retractions.. Cardiovascular Pedal Pulses WNL. No clubbing, cyanosis or edema. Lymphatic No adneopathy. No adenopathy. No adenopathy. Musculoskeletal Adexa without tenderness or enlargement.. Digits and nails w/o clubbing, cyanosis, infection, petechiae, ischemia, or inflammatory conditions.Marland Kitchen Psychiatric Judgement and insight  Intact.. No evidence of depression, anxiety, or agitation.. General Notes: the wound on the left calcaneum is looking fairly clean and after debriding it with moist saline gauze lot of the superficial slough came out and there is healthy granulation tissue. Integumentary (Hair, Skin) No suspicious lesions. No crepitus or fluctuance. No peri-wound warmth or erythema. No masses.. Wound #1 status is Open. Original cause of wound was Pressure Injury. The wound is located on the Right Calcaneus. The wound measures 1cm length x 1.1cm width x 0.1cm depth; 0.864cm^2 area and 0.086cm^3 volume. The wound is limited to skin breakdown. There is no tunneling or undermining noted. There is a large amount of serous drainage noted. The wound margin is flat and intact. There is large (67- 100%) red granulation within the wound bed. There is a small (1-33%) amount of necrotic tissue within the wound bed including Eschar and Adherent Slough. The periwound skin appearance exhibited: Dry/Scaly,  Erythema. The periwound skin appearance did not exhibit: Callus, Crepitus, Excoriation, Fluctuance, Friable, Induration, Localized Edema, Rash, Scarring, Maceration, Moist, Atrophie Blanche, Cyanosis, Ecchymosis, Hemosiderin Staining, Mottled, Pallor, Rubor. The surrounding wound skin color is noted with erythema which is circumferential. Periwound temperature was noted as No Abnormality. The periwound has tenderness on palpation. Assessment [name], [name] ([id number]) Active Problems ICD-10 L89.613 - Pressure ulcer of right heel, stage 3 E44.1 - Mild protein-calorie malnutrition Plan Wound Cleansing: Wound #1 Right Calcaneus: Cleanse wound with mild soap and water May Shower, gently pat wound dry prior to applying new dressing. May shower with protection. Anesthetic: Wound #1 Right Calcaneus: Topical Lidocaine 4% cream applied to wound bed prior to debridement - In Clinic Primary Wound Dressing: Wound #1 Right  Calcaneus: Prisma Ag Secondary Dressing: Wound #1 Right Calcaneus: Gauze and Kerlix/Conform - netting Dressing Change Frequency: Wound #1 Right Calcaneus: Three times weekly - For Home Health Specifics Follow-up Appointments: Wound #1 Right Calcaneus: Return Appointment in 1 week. Off-Loading: Wound #1 Right Calcaneus: Heel suspension boot to: - [employer] Additional Orders / Instructions: Wound #1 Right Calcaneus: Increase protein intake. Activity as tolerated Other: - Include these vitamins in your diet, MVI, ZINC, Vit C, Vit A Home Health: Wound #1 Right Calcaneus: Continue Home Health Visits - [facility] Health Nurse may visit PRN to address patient s wound care needs. FACE TO FACE ENCOUNTER: MEDICARE and MEDICAID PATIENTS: I certify that this patient is under my care and that I had a face-to-face encounter that meets the physician face-to-face encounter requirements with this patient on this date. The encounter with the patient was in whole or in part for the following MEDICAL CONDITION: (primary reason for Home Healthcare) MEDICAL NECESSITY: I certify, [name], [name] ([id number]) that based on my findings, NURSING services are a medically necessary home health service. HOME BOUND STATUS: I certify that my clinical findings support that this patient is homebound (i.e., Due to illness or injury, pt requires aid of supportive devices such as crutches, cane, wheelchairs, walkers, the use of special transportation or the assistance of another person to leave their place of residence. There is a normal inability to leave the home and doing so requires considerable and taxing effort. Other absences are for medical reasons / religious services and are infrequent or of short duration when for other reasons). If current dressing causes regression in wound condition, may D/C ordered dressing product/s and apply Normal Saline Moist Dressing daily until next Wound Healing Center /  Other MD appointment. Notify Wound Healing Center of regression in wound condition at [phone]. Please direct any NON-WOUND related issues/requests for orders to patient's Primary Care Physician General Notes: [employer] insurance for skin subooo EPIFIX I have recommended: 1. Prisma Ag locally to be applied every other day. 2. Offloading has been discussed in great detail 3. Adequate protein, vitamin A, vitamin C and zinc 4. regular visits to the wound center Electronic Signature(s) Signed: 11/23/2015 3:17:18 PM By: Evlyn Kanner MD, FACS Entered By: Evlyn Kanner on 11/23/2015 15:17:17 [name] ([id number]) -------------------------------------------------------------------------------- SuperBill Details Patient Name: [name] Date of Service: 11/23/2015 Medical Record Number: [id number] Patient Account Number: [id number] Date of Birth/Sex: 09/06/1924 (82 y.o. Female) Treating RN: Curtis Sites Primary Care Physician: Rolm Gala Other Clinician: Referring Physician: Rolm Gala Treating Physician/Extender: Rudene Re in Treatment: 10 Diagnosis Coding ICD-10 Codes Code Description [phone] Pressure ulcer of right heel, stage 3 E44.1 Mild protein-calorie malnutrition Facility Procedures CPT4 Code: [id number] Description: [phone] -  WOUND CARE VISIT-LEV 2 EST PT Modifier: Quantity: 1 Physician Procedures CPT4 Code: [id number] Description: 99213 - WC PHYS LEVEL 3 - EST PT ICD-10 Description Diagnosis L89.613 Pressure ulcer of right heel, stage 3 E44.1 Mild protein-calorie malnutrition Modifier: Quantity: 1 Electronic Signature(s) Signed: 11/23/2015 5:39:40 PM By: Elpidio EricAfful, Rita BSN, RN Previous Signature: 11/23/2015 3:17:32 PM Version By: Evlyn KannerBritto, Tyrion Glaude MD, FACS Entered By: Elpidio EricAfful, Rita on 11/23/2015 17:39:40

## 2015-11-30 ENCOUNTER — Encounter: Payer: Medicare Other | Admitting: Surgery

## 2015-11-30 DIAGNOSIS — L89613 Pressure ulcer of right heel, stage 3: Secondary | ICD-10-CM | POA: Diagnosis not present

## 2015-12-01 NOTE — Progress Notes (Signed)
PATTIJO, JUSTE ([id number]) Visit Report for 11/30/2015 Chief Complaint Document Details Patient Name: Colleen, Chavez Date of Service: 11/30/2015 2:15 PM Medical Record Number: [id number] Patient Account Number: [id number] Date of Birth/Sex: 1924-10-17 (82 y.o. Female) Treating RN: Huel Coventry Primary Care Physician: Rolm Gala Other Clinician: Referring Physician: Rolm Gala Treating Physician/Extender: Rudene Re in Treatment: 11 Information Obtained from: Patient Chief Complaint Patient is at the clinic for treatment of an open pressure ulcer right heel which she's had for about 8 weeks Electronic Signature(s) Signed: 11/30/2015 2:57:04 PM By: Evlyn Kanner MD, FACS Entered By: Evlyn Kanner on 11/30/2015 14:57:04 [name] ([id number]) -------------------------------------------------------------------------------- HPI Details Patient Name: [name] Date of Service: 11/30/2015 2:15 PM Medical Record Number: [id number] Patient Account Number: [id number] Date of Birth/Sex: 11/05/1924 (82 y.o. Female) Treating RN: Huel Coventry Primary Care Physician: Rolm Gala Other Clinician: Referring Physician: Rolm Gala Treating Physician/Extender: Rudene Re in Treatment: 11 History of Present Illness Location: right heel Quality: Patient reports experiencing a dull pain to affected area(s). Severity: Patient states wound are getting worse. Duration: Patient has had the wound for < 8 weeks prior to presenting for treatment Timing: Pain in wound is Intermittent (comes and goes Context: The wound occurred when the patient the hip fracture and was in traction for a few days while in hospital Modifying Factors: Other treatment(s) tried include:local care symptomatic treatment Associated Signs and Symptoms: Patient reports having increase discharge. HPI Description: 82 year old patient has been referred to as by her PCP for a pressure ulcer of  the right heel. his had this for about 8 weeks Has been treated with duoderm. Has been there for a while it's getting more painful and larger and she also was seen for a recent urinary tract infection. She was given ciprofloxacin for this and then changed on to nitrofurantoin. She has a past medical history of rheumatic fever, hyperlipidemia, hypertension, scoliosis and osteopenia. never been a smoker. 10/05/2015 -- x-ray of the right heel -- IMPRESSION:No osseous finding of significance. Abnormal appearance of the soft tissues posterior to the heel which could be due to infection or trauma. I do not see an intact Achilles tendon shadow. Electronic Signature(s) Signed: 11/30/2015 2:57:10 PM By: Evlyn Kanner MD, FACS Entered By: Evlyn Kanner on 11/30/2015 14:57:10 [name] ([id number]) -------------------------------------------------------------------------------- Physical Exam Details Patient Name: [name] Date of Service: 11/30/2015 2:15 PM Medical Record Number: [id number] Patient Account Number: [id number] Date of Birth/Sex: May 20, 1924 (82 y.o. Female) Treating RN: Huel Coventry Primary Care Physician: Rolm Gala Other Clinician: Referring Physician: Rolm Gala Treating Physician/Extender: Rudene Re in Treatment: 11 Constitutional . Pulse regular. Respirations normal and unlabored. Afebrile. . Eyes Nonicteric. Reactive to light. Ears, Nose, Mouth, and Throat Lips, teeth, and gums WNL.Marland Kitchen Moist mucosa without lesions. Neck supple and nontender. No palpable supraclavicular or cervical adenopathy. Normal sized without goiter. Respiratory WNL. No retractions.. Breath sounds WNL, No rubs, rales, rhonchi, or wheeze.. Cardiovascular Pedal Pulses WNL. No clubbing, cyanosis or edema. Lymphatic No adneopathy. No adenopathy. No adenopathy. Musculoskeletal Adexa without tenderness or enlargement.. Digits and nails w/o clubbing, cyanosis, infection,  petechiae, ischemia, or inflammatory conditions.. Integumentary (Hair, Skin) No suspicious lesions. No crepitus or fluctuance. No peri-wound warmth or erythema. No masses.Marland Kitchen Psychiatric Judgement and insight Intact.. No evidence of depression, anxiety, or agitation.. Notes was washed out with moist saline gauze and it looks very clean and it is got no surrounding callus. Electronic Signature(s) Signed: 11/30/2015 2:57:38 PM By: Evlyn Kanner MD,  FACS Entered By: Evlyn Kanner on 11/30/2015 14:57:37 [name] ([id number]) -------------------------------------------------------------------------------- Physician Orders Details Patient Name: [name] Date of Service: 11/30/2015 2:15 PM Medical Record Number: [id number] Patient Account Number: [id number] Date of Birth/Sex: 1924-06-09 (82 y.o. Female) Treating RN: Curtis Sites Primary Care Physician: Rolm Gala Other Clinician: Referring Physician: Rolm Gala Treating Physician/Extender: Rudene Re in Treatment: 31 Verbal / Phone Orders: Yes Clinician: Curtis Sites Read Back and Verified: Yes Diagnosis Coding Wound Cleansing Wound #1 Right Calcaneus o Cleanse wound with mild soap and water o May Shower, gently pat wound dry prior to applying new dressing. o May shower with protection. Anesthetic Wound #1 Right Calcaneus o Topical Lidocaine 4% cream applied to wound bed prior to debridement - In Clinic Skin Barriers/Peri-Wound Care Wound #1 Right Calcaneus o Other: - may apply TCA cream to peri wound skin for itching Primary Wound Dressing Wound #1 Right Calcaneus o Other: - sorbact with hydrogel - sorbact was given to patient Secondary Dressing Wound #1 Right Calcaneus o Gauze and Kerlix/Conform - netting Dressing Change Frequency Wound #1 Right Calcaneus o Three times weekly - For Home Health Specifics Follow-up Appointments Wound #1 Right Calcaneus o Return Appointment  in 1 week. Off-Loading Wound #1 Right Calcaneus o Heel suspension boot to: [name], [name] ([id number]) Additional Orders / Instructions Wound #1 Right Calcaneus o Increase protein intake. o Activity as tolerated o Other: - Include these vitamins in your diet, MVI, ZINC, Vit C, Vit A Home Health Wound #1 Right Calcaneus o Continue Home Health Visits - [facility] o Home Health Nurse may visit PRN to address patientos wound care needs. o FACE TO FACE ENCOUNTER: MEDICARE and MEDICAID PATIENTS: I certify that this patient is under my care and that I had a face-to-face encounter that meets the physician face-to-face encounter requirements with this patient on this date. The encounter with the patient was in whole or in part for the following MEDICAL CONDITION: (primary reason for Home Healthcare) MEDICAL NECESSITY: I certify, that based on my findings, NURSING services are a medically necessary home health service. HOME BOUND STATUS: I certify that my clinical findings support that this patient is homebound (i.e., Due to illness or injury, pt requires aid of supportive devices such as crutches, cane, wheelchairs, walkers, the use of special transportation or the assistance of another person to leave their place of residence. There is a normal inability to leave the home and doing so requires considerable and taxing effort. Other absences are for medical reasons / religious services and are infrequent or of short duration when for other reasons). o If current dressing causes regression in wound condition, may D/C ordered dressing product/s and apply Normal Saline Moist Dressing daily until next Wound Healing Center / Other MD appointment. Notify Wound Healing Center of regression in wound condition at [phone]. o Please direct any NON-WOUND related issues/requests for orders to patient's Primary Care Physician Electronic Signature(s) Signed: 11/30/2015  4:19:07 PM By: Evlyn Kanner MD, FACS Signed: 11/30/2015 5:14:40 PM By: Curtis Sites Entered By: Curtis Sites on 11/30/2015 14:51:51 [name] ([id number]) -------------------------------------------------------------------------------- Problem List Details Patient Name: [name] Date of Service: 11/30/2015 2:15 PM Medical Record Number: [id number] Patient Account Number: [id number] Date of Birth/Sex: Jan 20, 1925 (82 y.o. Female) Treating RN: Huel Coventry Primary Care Physician: Rolm Gala Other Clinician: Referring Physician: Rolm Gala Treating Physician/Extender: Rudene Re in Treatment: 11 Active Problems ICD-10 Encounter Code Description Active Date Diagnosis L89.613 Pressure ulcer of  right heel, stage 3 09/14/2015 Yes E44.1 Mild protein-calorie malnutrition 09/14/2015 Yes Inactive Problems Resolved Problems Electronic Signature(s) Signed: 11/30/2015 2:56:32 PM By: Evlyn Kanner MD, FACS Entered By: Evlyn Kanner on 11/30/2015 14:56:32 [name], [name] ([id number]) -------------------------------------------------------------------------------- Progress Note Details Patient Name: [name] Date of Service: 11/30/2015 2:15 PM Medical Record Number: [id number] Patient Account Number: [id number] Date of Birth/Sex: [birth year] (82 y.o. Female) Treating RN: Huel Coventry Primary Care Physician: Rolm Gala Other Clinician: Referring Physician: Rolm Gala Treating Physician/Extender: Rudene Re in Treatment: 11 Subjective Chief Complaint Information obtained from Patient Patient is at the clinic for treatment of an open pressure ulcer right heel which she's had for about 8 weeks History of Present Illness (HPI) The following HPI elements were documented for the patient's wound: Location: right heel Quality: Patient reports experiencing a dull pain to affected area(s). Severity: Patient states wound are getting  worse. Duration: Patient has had the wound for < 8 weeks prior to presenting for treatment Timing: Pain in wound is Intermittent (comes and goes Context: The wound occurred when the patient the hip fracture and was in traction for a few days while in hospital Modifying Factors: Other treatment(s) tried include:local care symptomatic treatment Associated Signs and Symptoms: Patient reports having increase discharge. 82 year old patient has been referred to as by her PCP for a pressure ulcer of the right heel. his had this for about 8 weeks Has been treated with duoderm. Has been there for a while it's getting more painful and larger and she also was seen for a recent urinary tract infection. She was given ciprofloxacin for this and then changed on to nitrofurantoin. She has a past medical history of rheumatic fever, hyperlipidemia, hypertension, scoliosis and osteopenia. never been a smoker. 10/05/2015 -- x-ray of the right heel -- IMPRESSION:No osseous finding of significance. Abnormal appearance of the soft tissues posterior to the heel which could be due to infection or trauma. I do not see an intact Achilles tendon shadow. Objective Constitutional Pulse regular. Respirations normal and unlabored. Afebrile. Vitals Time Taken: 2:31 PM, Height: 59 in, Weight: 108 lbs, BMI: 21.8, Pulse: 79 bpm, Respiratory Rate: 16 [name], [name] ([id number]) breaths/min, Blood Pressure: 135/84 mmHg. Eyes Nonicteric. Reactive to light. Ears, Nose, Mouth, and Throat Lips, teeth, and gums WNL.Marland Kitchen Moist mucosa without lesions. Neck supple and nontender. No palpable supraclavicular or cervical adenopathy. Normal sized without goiter. Respiratory WNL. No retractions.. Breath sounds WNL, No rubs, rales, rhonchi, or wheeze.. Cardiovascular Pedal Pulses WNL. No clubbing, cyanosis or edema. Lymphatic No adneopathy. No adenopathy. No adenopathy. Musculoskeletal Adexa without tenderness or enlargement.. Digits  and nails w/o clubbing, cyanosis, infection, petechiae, ischemia, or inflammatory conditions.Marland Kitchen Psychiatric Judgement and insight Intact.. No evidence of depression, anxiety, or agitation.. General Notes: was washed out with moist saline gauze and it looks very clean and it is got no surrounding callus. Integumentary (Hair, Skin) No suspicious lesions. No crepitus or fluctuance. No peri-wound warmth or erythema. No masses.. Wound #1 status is Open. Original cause of wound was Pressure Injury. The wound is located on the Right Calcaneus. The wound measures 1cm length x 0.9cm width x 0.1cm depth; 0.707cm^2 area and 0.071cm^3 volume. The wound is limited to skin breakdown. There is no tunneling or undermining noted. There is a large amount of serous drainage noted. The wound margin is flat and intact. There is large (67- 100%) red granulation within the wound bed. There is a small (1-33%) amount of necrotic tissue within the wound bed including  Eschar and Adherent Slough. The periwound skin appearance exhibited: Dry/Scaly, Erythema. The periwound skin appearance did not exhibit: Callus, Crepitus, Excoriation, Fluctuance, Friable, Induration, Localized Edema, Rash, Scarring, Maceration, Moist, Atrophie Blanche, Cyanosis, Ecchymosis, Hemosiderin Staining, Mottled, Pallor, Rubor. The surrounding wound skin color is noted with erythema which is circumferential. Periwound temperature was noted as No Abnormality. The periwound has tenderness on palpation. Assessment [name], [name] ([id number]) Active Problems ICD-10 L89.613 - Pressure ulcer of right heel, stage 3 E44.1 - Mild protein-calorie malnutrition Plan Wound Cleansing: Wound #1 Right Calcaneus: Cleanse wound with mild soap and water May Shower, gently pat wound dry prior to applying new dressing. May shower with protection. Anesthetic: Wound #1 Right Calcaneus: Topical Lidocaine 4% cream applied to wound bed prior to debridement - In  Clinic Skin Barriers/Peri-Wound Care: Wound #1 Right Calcaneus: Other: - may apply TCA cream to peri wound skin for itching Primary Wound Dressing: Wound #1 Right Calcaneus: Other: - sorbact with hydrogel - sorbact was given to patient Secondary Dressing: Wound #1 Right Calcaneus: Gauze and Kerlix/Conform - netting Dressing Change Frequency: Wound #1 Right Calcaneus: Three times weekly - For Home Health Specifics Follow-up Appointments: Wound #1 Right Calcaneus: Return Appointment in 1 week. Off-Loading: Wound #1 Right Calcaneus: Heel suspension boot to: - [employer] Additional Orders / Instructions: Wound #1 Right Calcaneus: Increase protein intake. Activity as tolerated Other: - Include these vitamins in your diet, MVI, ZINC, Vit C, Vit A Home Health: Wound #1 Right Calcaneus: Continue Home Health Visits - [facility] Nurse may visit PRN to address patient s wound care needs. FACE TO FACE ENCOUNTER: MEDICARE and MEDICAID PATIENTS: I certify that this patient is under [name], [name] ([id number]) my care and that I had a face-to-face encounter that meets the physician face-to-face encounter requirements with this patient on this date. The encounter with the patient was in whole or in part for the following MEDICAL CONDITION: (primary reason for Home Healthcare) MEDICAL NECESSITY: I certify, that based on my findings, NURSING services are a medically necessary home health service. HOME BOUND STATUS: I certify that my clinical findings support that this patient is homebound (i.e., Due to illness or injury, pt requires aid of supportive devices such as crutches, cane, wheelchairs, walkers, the use of special transportation or the assistance of another person to leave their place of residence. There is a normal inability to leave the home and doing so requires considerable and taxing effort. Other absences are for medical reasons / religious services and are infrequent or  of short duration when for other reasons). If current dressing causes regression in wound condition, may D/C ordered dressing product/s and apply Normal Saline Moist Dressing daily until next Wound Healing Center / Other MD appointment. Notify Wound Healing Center of regression in wound condition at [phone]. Please direct any NON-WOUND related issues/requests for orders to patient's Primary Care Physician I have recommended: 1. Sorbact with hydrogel locally to be applied every other day. 2. Offloading has been discussed in great detail 3. Adequate protein, vitamin A, vitamin C and zinc 4. regular visits to the wound center at this stage we have treated her conservatively for a long while and she has now healthy granulation tissue which will benefit a great deal from a cellular or tissue base product hasten the process of healing. Electronic Signature(s) Signed: 11/30/2015 2:58:45 PM By: Evlyn Kanner MD, FACS Entered By: Evlyn Kanner on 11/30/2015 14:58:44 [name], [name] ([id number]) -------------------------------------------------------------------------------- SuperBill Details Patient Name: [name], [name]  Date of Service: 11/30/2015 Medical Record Number: [id number] Patient Account Number: [id number] Date of Birth/Sex: 04/30/1924 (82 y.o. Female) Treating RN: Huel CoventryWoody, Kim Primary Care Physician: Rolm GalaGrandis, Heidi Other Clinician: Referring Physician: Rolm GalaGrandis, Heidi Treating Physician/Extender: Rudene ReBritto, Geraldy Akridge Weeks in Treatment: 11 Diagnosis Coding ICD-10 Codes Code Description [phone] Pressure ulcer of right heel, stage 3 E44.1 Mild protein-calorie malnutrition Facility Procedures CPT4 Code: [id number] Description: 99213 - WOUND CARE VISIT-LEV 3 EST PT Modifier: Quantity: 1 Physician Procedures CPT4 Code: [id number] Description: 99213 - WC PHYS LEVEL 3 - EST PT ICD-10 Description Diagnosis L89.613 Pressure ulcer of right heel, stage 3 E44.1 Mild protein-calorie  malnutrition Modifier: Quantity: 1 Electronic Signature(s) Signed: 11/30/2015 2:58:57 PM By: Evlyn KannerBritto, Tye Vigo MD, FACS Entered By: Evlyn KannerBritto, Yurianna Tusing on 11/30/2015 14:58:55

## 2015-12-01 NOTE — Progress Notes (Signed)
BRIENNA, BASS ([id number]) Visit Report for 11/30/2015 Arrival Information Details Patient Name: JAZZY, PARMER Date of Service: 11/30/2015 2:15 PM Medical Record Number: [id number] Patient Account Number: [id number] Date of Birth/Sex: Aug 08, 1924 (82 y.o. Female) Treating RN: Curtis Sites Primary Care Physician: Rolm Gala Other Clinician: Referring Physician: Rolm Gala Treating Physician/Extender: Rudene Re in Treatment: 11 Visit Information History Since Last Visit Added or deleted any medications: No Patient Arrived: Wheel Chair Any new allergies or adverse reactions: No Arrival Time: 14:23 Had a fall or experienced change in No Accompanied By: dtr activities of daily living that may affect Transfer Assistance: Manual risk of falls: Patient Identification Verified: Yes Signs or symptoms of abuse/neglect since last No Secondary Verification Process Yes visito Completed: Hospitalized since last visit: No Patient Requires Transmission- No Pain Present Now: No Based Precautions: Patient Has Alerts: Yes Patient Alerts: Patient on Blood Thinner eliquis Electronic Signature(s) Signed: 11/30/2015 5:14:40 PM By: Curtis Sites Entered By: Curtis Sites on 11/30/2015 14:30:44 Fichera, Theora Gianotti ([id number]) -------------------------------------------------------------------------------- Clinic Level of Care Assessment Details Patient Name: [name] Date of Service: 11/30/2015 2:15 PM Medical Record Number: [id number] Patient Account Number: [id number] Date of Birth/Sex: 07/19/1924 (82 y.o. Female) Treating RN: Curtis Sites Primary Care Physician: Rolm Gala Other Clinician: Referring Physician: Rolm Gala Treating Physician/Extender: Rudene Re in Treatment: 11 Clinic Level of Care Assessment Items TOOL 4 Quantity Score []  - Use when only an EandM is performed on FOLLOW-UP visit 0 ASSESSMENTS - Nursing Assessment /  Reassessment X - Reassessment of Co-morbidities (includes updates in patient status) 1 10 X - Reassessment of Adherence to Treatment Plan 1 5 ASSESSMENTS - Wound and Skin Assessment / Reassessment X - Simple Wound Assessment / Reassessment - one wound 1 5 []  - Complex Wound Assessment / Reassessment - multiple wounds 0 []  - Dermatologic / Skin Assessment (not related to wound area) 0 ASSESSMENTS - Focused Assessment []  - Circumferential Edema Measurements - multi extremities 0 []  - Nutritional Assessment / Counseling / Intervention 0 X - Lower Extremity Assessment (monofilament, tuning fork, pulses) 1 5 []  - Peripheral Arterial Disease Assessment (using hand held doppler) 0 ASSESSMENTS - Ostomy and/or Continence Assessment and Care []  - Incontinence Assessment and Management 0 []  - Ostomy Care Assessment and Management (repouching, etc.) 0 PROCESS - Coordination of Care X - Simple Patient / Family Education for ongoing care 1 15 []  - Complex (extensive) Patient / Family Education for ongoing care 0 []  - Staff obtains Chiropractor, Records, Test Results / Process Orders 0 []  - Staff telephones HHA, Nursing Homes / Clarify orders / etc 0 []  - Routine Transfer to another Facility (non-emergent condition) 0 Billinger, Aija L. ([id number]) []  - Routine Hospital Admission (non-emergent condition) 0 []  - New Admissions / Manufacturing engineer / Ordering NPWT, Apligraf, etc. 0 []  - Emergency Hospital Admission (emergent condition) 0 X - Simple Discharge Coordination 1 10 []  - Complex (extensive) Discharge Coordination 0 PROCESS - Special Needs []  - Pediatric / Minor Patient Management 0 []  - Isolation Patient Management 0 []  - Hearing / Language / Visual special needs 0 []  - Assessment of Community assistance (transportation, D/C planning, etc.) 0 []  - Additional assistance / Altered mentation 0 []  - Support Surface(s) Assessment (bed, cushion, seat, etc.) 0 INTERVENTIONS - Wound Cleansing /  Measurement X - Simple Wound Cleansing - one wound 1 5 []  - Complex Wound Cleansing - multiple wounds 0 X - Wound Imaging (photographs - any number of wounds) 1 5 []  -  Wound Tracing (instead of photographs) 0 X - Simple Wound Measurement - one wound 1 5 []  - Complex Wound Measurement - multiple wounds 0 INTERVENTIONS - Wound Dressings X - Small Wound Dressing one or multiple wounds 1 10 []  - Medium Wound Dressing one or multiple wounds 0 []  - Large Wound Dressing one or multiple wounds 0 []  - Application of Medications - topical 0 []  - Application of Medications - injection 0 INTERVENTIONS - Miscellaneous []  - External ear exam 0 Ridlon, Shantay L. ([id number]) []  - Specimen Collection (cultures, biopsies, blood, body fluids, etc.) 0 []  - Specimen(s) / Culture(s) sent or taken to Lab for analysis 0 []  - Patient Transfer (multiple staff / Michiel Sites Lift / Similar devices) 0 []  - Simple Staple / Suture removal (25 or less) 0 []  - Complex Staple / Suture removal (26 or more) 0 []  - Hypo / Hyperglycemic Management (close monitor of Blood Glucose) 0 []  - Ankle / Brachial Index (ABI) - do not check if billed separately 0 X - Vital Signs 1 5 Has the patient been seen at the hospital within the last three years: Yes Total Score: 80 Level Of Care: New/Established - Level 3 Electronic Signature(s) Signed: 11/30/2015 5:14:40 PM By: Curtis Sites Entered By: Curtis Sites on 11/30/2015 14:51:06 [name] ([id number]) -------------------------------------------------------------------------------- Encounter Discharge Information Details Patient Name: [name] Date of Service: 11/30/2015 2:15 PM Medical Record Number: [id number] Patient Account Number: [id number] Date of Birth/Sex: [birth year] (82 y.o. Female) Treating RN: Huel Coventry Primary Care Physician: Rolm Gala Other Clinician: Referring Physician: Rolm Gala Treating Physician/Extender: Rudene Re in  Treatment: 43 Encounter Discharge Information Items Discharge Pain Level: 0 Discharge Condition: Stable Ambulatory Status: Wheelchair Discharge Destination: Home Transportation: Private Auto Accompanied By: dtr Schedule Follow-up Appointment: Yes Medication Reconciliation completed and provided to Patient/Care No Raynelle Fujikawa: Provided on Clinical Summary of Care: 11/30/2015 Form Type Recipient Paper Patient DI Electronic Signature(s) Signed: 11/30/2015 4:33:31 PM By: Curtis Sites Previous Signature: 11/30/2015 3:08:26 PM Version By: Gwenlyn Perking Entered By: Curtis Sites on 11/30/2015 16:33:31 Papandrea, Keimora Elbert Ewings ([id number]) -------------------------------------------------------------------------------- Multi Wound Chart Details Patient Name: [name] Date of Service: 11/30/2015 2:15 PM Medical Record Number: [id number] Patient Account Number: [id number] Date of Birth/Sex: 1924/07/10 (82 y.o. Female) Treating RN: Curtis Sites Primary Care Physician: Rolm Gala Other Clinician: Referring Physician: Rolm Gala Treating Physician/Extender: Rudene Re in Treatment: 11 Vital Signs Height(in): 59 Pulse(bpm): 79 Weight(lbs): 108 Blood Pressure 135/84 (mmHg): Body Mass Index(BMI): 22 Temperature(F): Respiratory Rate 16 (breaths/min): Photos: [N/A:N/A] Wound Location: Right Calcaneus N/A N/A Wounding Event: Pressure Injury N/A N/A Primary Etiology: Pressure Ulcer N/A N/A Comorbid History: Deep Vein Thrombosis, N/A N/A Hypertension, Dementia Date Acquired: 07/14/2015 N/A N/A Weeks of Treatment: 11 N/A N/A Wound Status: Open N/A N/A Measurements L x W x D 1x0.9x0.1 N/A N/A (cm) Area (cm) : 0.707 N/A N/A Volume (cm) : 0.071 N/A N/A % Reduction in Area: 87.20% N/A N/A % Reduction in Volume: 93.60% N/A N/A Classification: Category/Stage III N/A N/A Exudate Amount: Large N/A N/A Exudate Type: Serous N/A N/A Exudate Color: amber N/A  N/A Wound Margin: Flat and Intact N/A N/A Granulation Amount: Large (67-100%) N/A N/A Granulation Quality: Red N/A N/A Necrotic Amount: Small (1-33%) N/A N/A Bitton, Nicci L. ([id number]) Necrotic Tissue: Eschar, Adherent Slough N/A N/A Exposed Structures: Fascia: No N/A N/A Fat: No Tendon: No Muscle: No Joint: No Bone: No Limited to Skin Breakdown Epithelialization: None N/A N/A Periwound Skin Texture: Edema: No N/A  N/A Excoriation: No Induration: No Callus: No Crepitus: No Fluctuance: No Friable: No Rash: No Scarring: No Periwound Skin Dry/Scaly: Yes N/A N/A Moisture: Maceration: No Moist: No Periwound Skin Color: Erythema: Yes N/A N/A Atrophie Blanche: No Cyanosis: No Ecchymosis: No Hemosiderin Staining: No Mottled: No Pallor: No Rubor: No Erythema Location: Circumferential N/A N/A Temperature: No Abnormality N/A N/A Tenderness on Yes N/A N/A Palpation: Wound Preparation: Ulcer Cleansing: N/A N/A Rinsed/Irrigated with Saline Topical Anesthetic Applied: Other: lidocaine 4% Treatment Notes Electronic Signature(s) Signed: 11/30/2015 5:14:40 PM By: Curtis Sitesorthy, Joanna Entered By: Curtis Sitesorthy, Joanna on 11/30/2015 14:48:29 [name], Jamonica L. ([id number]) -------------------------------------------------------------------------------- Multi-Disciplinary Care Plan Details Patient Name: [name], Letty L. Date of Service: 11/30/2015 2:15 PM Medical Record Number: [id number] Patient Account Number: [id number] Date of Birth/Sex: 01/21/1925 (82 y.o. Female) Treating RN: Curtis Sitesorthy, Joanna Primary Care Physician: Rolm GalaGrandis, Heidi Other Clinician: Referring Physician: Rolm GalaGrandis, Heidi Treating Physician/Extender: Rudene ReBritto, Errol Weeks in Treatment: 3311 Active Inactive Abuse / Safety / Falls / Self Care Management Nursing Diagnoses: Impaired home maintenance Impaired physical mobility Potential for falls Self care deficit: actual or potential Goals: Patient will remain injury  free Date Initiated: 09/14/2015 Goal Status: Active Patient/caregiver will verbalize understanding of skin care regimen Date Initiated: 09/14/2015 Goal Status: Active Patient/caregiver will verbalize/demonstrate measure taken to improve self care Date Initiated: 09/14/2015 Goal Status: Active Patient/caregiver will verbalize/demonstrate measures taken to improve the patient's personal safety Date Initiated: 09/14/2015 Goal Status: Active Patient/caregiver will verbalize/demonstrate measures taken to prevent injury and/or falls Date Initiated: 09/14/2015 Goal Status: Active Patient/caregiver will verbalize/demonstrate understanding of what to do in case of emergency Date Initiated: 09/14/2015 Goal Status: Active Interventions: Assess fall risk on admission and as needed Assess: immobility, friction, shearing, incontinence upon admission and as needed Assess impairment of mobility on admission and as needed per policy Assess self care needs on admission and as needed Provide education on basic hygiene Provide education on personal and home safety Harvel QualeSLEY, Pami L. ([id number]) Provide education on safe transfers Notes: Orientation to the Wound Care Program Nursing Diagnoses: Knowledge deficit related to the wound healing center program Goals: Patient/caregiver will verbalize understanding of the Wound Healing Center Program Date Initiated: 09/14/2015 Goal Status: Active Interventions: Provide education on orientation to the wound center Notes: Pressure Nursing Diagnoses: Knowledge deficit related to causes and risk factors for pressure ulcer development Knowledge deficit related to management of pressures ulcers Potential for impaired tissue integrity related to pressure, friction, moisture, and shear Goals: Patient will remain free from development of additional pressure ulcers Date Initiated: 09/14/2015 Goal Status: Active Patient will remain free of pressure ulcers Date  Initiated: 09/14/2015 Goal Status: Active Patient/caregiver will verbalize risk factors for pressure ulcer development Date Initiated: 09/14/2015 Goal Status: Active Patient/caregiver will verbalize understanding of pressure ulcer management Date Initiated: 09/14/2015 Goal Status: Active Interventions: Assess: immobility, friction, shearing, incontinence upon admission and as needed Assess offloading mechanisms upon admission and as needed Assess potential for pressure ulcer upon admission and as needed Provide education on pressure ulcers Treatment Activities: Harvel QualeSLEY, Laurene L. ([id number]) Patient referred for pressure reduction/relief devices : 09/14/2015 Patient referred for seating evaluation to ensure proper offloading : 09/14/2015 Pressure reduction/relief device ordered : 09/14/2015 Notes: Wound/Skin Impairment Nursing Diagnoses: Impaired tissue integrity Knowledge deficit related to ulceration/compromised skin integrity Goals: Patient/caregiver will verbalize understanding of skin care regimen Date Initiated: 09/14/2015 Goal Status: Active Ulcer/skin breakdown will have a volume reduction of 30% by week 4 Date Initiated: 09/14/2015 Goal Status: Active Ulcer/skin breakdown will have a volume reduction of  50% by week 8 Date Initiated: 09/14/2015 Goal Status: Active Ulcer/skin breakdown will have a volume reduction of 80% by week 12 Date Initiated: 09/14/2015 Goal Status: Active Ulcer/skin breakdown will heal within 14 weeks Date Initiated: 09/14/2015 Goal Status: Active Interventions: Assess patient/caregiver ability to obtain necessary supplies Assess patient/caregiver ability to perform ulcer/skin care regimen upon admission and as needed Assess ulceration(s) every visit Provide education on ulcer and skin care Treatment Activities: Referred to DME Adelynn Gipe for dressing supplies : 09/14/2015 Skin care regimen initiated : 09/14/2015 Topical wound management initiated :  09/14/2015 Notes: Electronic Signature(s) Signed: 11/30/2015 5:14:40 PM By: Curtis Sites Entered By: Curtis Sites on 11/30/2015 14:48:22 [name] ([id number] Mallick, Theora Gianotti ([id number]) -------------------------------------------------------------------------------- Pain Assessment Details Patient Name: [name] Date of Service: 11/30/2015 2:15 PM Medical Record Number: [id number] Patient Account Number: [id number] Date of Birth/Sex: 01-Aug-1924 (82 y.o. Female) Treating RN: Curtis Sites Primary Care Physician: Rolm Gala Other Clinician: Referring Physician: Rolm Gala Treating Physician/Extender: Rudene Re in Treatment: 11 Active Problems Location of Pain Severity and Description of Pain Patient Has Paino No Site Locations Pain Management and Medication Current Pain Management: Notes Topical or injectable lidocaine is offered to patient for acute pain when surgical debridement is performed. If needed, Patient is instructed to use over the counter pain medication for the following 24-48 hours after debridement. Wound care MDs do not prescribed pain medications. Patient has chronic pain or uncontrolled pain. Patient has been instructed to make an appointment with their Primary Care Physician for pain management. Electronic Signature(s) Signed: 11/30/2015 5:14:40 PM By: Curtis Sites Entered By: Curtis Sites on 11/30/2015 14:30:54 [name] ([id number]) -------------------------------------------------------------------------------- Patient/Caregiver Education Details Patient Name: [name] Date of Service: 11/30/2015 2:15 PM Medical Record Number: [id number] Patient Account Number: [id number] Date of Birth/Gender: 10-Sep-1924 (82 y.o. Female) Treating RN: Curtis Sites Primary Care Physician: Rolm Gala Other Clinician: Referring Physician: Rolm Gala Treating Physician/Extender: Rudene Re in  Treatment: 11 Education Assessment Education Provided To: Caregiver Education Topics Provided Wound/Skin Impairment: Handouts: Other: new wound care as ordered Methods: Demonstration, Explain/Verbal Responses: State content correctly Electronic Signature(s) Signed: 11/30/2015 5:14:40 PM By: Curtis Sites Entered By: Curtis Sites on 11/30/2015 16:34:05 Bruster, Theora Gianotti ([id number]) -------------------------------------------------------------------------------- Wound Assessment Details Patient Name: [name] Date of Service: 11/30/2015 2:15 PM Medical Record Number: [id number] Patient Account Number: [id number] Date of Birth/Sex: [birth year] (82 y.o. Female) Treating RN: Curtis Sites Primary Care Physician: Rolm Gala Other Clinician: Referring Physician: Rolm Gala Treating Physician/Extender: Rudene Re in Treatment: 11 Wound Status Wound Number: 1 Primary Pressure Ulcer Etiology: Wound Location: Right Calcaneus Wound Status: Open Wounding Event: Pressure Injury Comorbid Deep Vein Thrombosis, Date Acquired: 07/14/2015 History: Hypertension, Dementia Weeks Of Treatment: 11 Clustered Wound: No Photos Wound Measurements Length: (cm) 1 Width: (cm) 0.9 Depth: (cm) 0.1 Area: (cm) 0.707 Volume: (cm) 0.071 % Reduction in Area: 87.2% % Reduction in Volume: 93.6% Epithelialization: None Tunneling: No Undermining: No Wound Description Classification: Category/Stage III Wound Margin: Flat and Intact Exudate Amount: Large Exudate Type: Serous Exudate Color: amber Foul Odor After Cleansing: No Wound Bed Granulation Amount: Large (67-100%) Exposed Structure Granulation Quality: Red Fascia Exposed: No Necrotic Amount: Small (1-33%) Fat Layer Exposed: No Necrotic Quality: Eschar, Adherent Slough Tendon Exposed: No Muscle Exposed: No Troyer, Valinda L. ([id number]) Joint Exposed: No Bone Exposed: No Limited to Skin Breakdown Periwound  Skin Texture Texture Color No Abnormalities Noted: No No Abnormalities Noted: No Callus: No Atrophie Blanche: No Crepitus: No  Cyanosis: No Excoriation: No Ecchymosis: No Fluctuance: No Erythema: Yes Friable: No Erythema Location: Circumferential Induration: No Hemosiderin Staining: No Localized Edema: No Mottled: No Rash: No Pallor: No Scarring: No Rubor: No Moisture Temperature / Pain No Abnormalities Noted: No Temperature: No Abnormality Dry / Scaly: Yes Tenderness on Palpation: Yes Maceration: No Moist: No Wound Preparation Ulcer Cleansing: Rinsed/Irrigated with Saline Topical Anesthetic Applied: Other: lidocaine 4%, Treatment Notes Wound #1 (Right Calcaneus) 1. Cleansed with: Clean wound with Normal Saline 2. Anesthetic Topical Lidocaine 4% cream to wound bed prior to debridement 4. Dressing Applied: Other dressing (specify in notes) 5. Secondary Dressing Applied Guaze, ABD and kerlix/Conform Notes sorbact with hydrogel, netting Electronic Signature(s) Signed: 11/30/2015 5:14:40 PM By: Curtis Sites Entered By: Curtis Sites on 11/30/2015 14:38:26 [name] ([id number]) -------------------------------------------------------------------------------- Vitals Details Patient Name: [name] Date of Service: 11/30/2015 2:15 PM Medical Record Number: [id number] Patient Account Number: [id number] Date of Birth/Sex: December 25, 1924 (82 y.o. Female) Treating RN: Curtis Sites Primary Care Physician: Rolm Gala Other Clinician: Referring Physician: Rolm Gala Treating Physician/Extender: Rudene Re in Treatment: 11 Vital Signs Time Taken: 14:31 Pulse (bpm): 79 Height (in): 59 Respiratory Rate (breaths/min): 16 Weight (lbs): 108 Blood Pressure (mmHg): 135/84 Body Mass Index (BMI): 21.8 Reference Range: 80 - 120 mg / dl Electronic Signature(s) Signed: 11/30/2015 5:14:40 PM By: Curtis Sites Entered By: Curtis Sites on  11/30/2015 14:31:14

## 2015-12-07 ENCOUNTER — Encounter: Payer: Medicare Other | Attending: Surgery | Admitting: Surgery

## 2015-12-07 DIAGNOSIS — Z79899 Other long term (current) drug therapy: Secondary | ICD-10-CM | POA: Diagnosis not present

## 2015-12-07 DIAGNOSIS — M419 Scoliosis, unspecified: Secondary | ICD-10-CM | POA: Diagnosis not present

## 2015-12-07 DIAGNOSIS — E441 Mild protein-calorie malnutrition: Secondary | ICD-10-CM | POA: Diagnosis not present

## 2015-12-07 DIAGNOSIS — Z7982 Long term (current) use of aspirin: Secondary | ICD-10-CM | POA: Insufficient documentation

## 2015-12-07 DIAGNOSIS — Z7901 Long term (current) use of anticoagulants: Secondary | ICD-10-CM | POA: Diagnosis not present

## 2015-12-07 DIAGNOSIS — I10 Essential (primary) hypertension: Secondary | ICD-10-CM | POA: Insufficient documentation

## 2015-12-07 DIAGNOSIS — M8580 Other specified disorders of bone density and structure, unspecified site: Secondary | ICD-10-CM | POA: Insufficient documentation

## 2015-12-07 DIAGNOSIS — Z880 Allergy status to penicillin: Secondary | ICD-10-CM | POA: Diagnosis not present

## 2015-12-07 DIAGNOSIS — E785 Hyperlipidemia, unspecified: Secondary | ICD-10-CM | POA: Diagnosis not present

## 2015-12-07 DIAGNOSIS — F0390 Unspecified dementia without behavioral disturbance: Secondary | ICD-10-CM | POA: Diagnosis not present

## 2015-12-07 DIAGNOSIS — L89613 Pressure ulcer of right heel, stage 3: Secondary | ICD-10-CM | POA: Diagnosis not present

## 2015-12-07 DIAGNOSIS — Z86718 Personal history of other venous thrombosis and embolism: Secondary | ICD-10-CM | POA: Diagnosis not present

## 2015-12-08 NOTE — Progress Notes (Signed)
[name], [name] ([id number]) Visit Report for 12/07/2015 Arrival Information Details Patient Name: [name], [name] Date of Service: 12/07/2015 1:45 PM Medical Record Number: [id number] Patient Account Number: [id number] Date of Birth/Sex: December 13, 1924 (82 y.o. Female) Treating RN: Curtis Sites Primary Care Physician: Rolm Gala Other Clinician: Referring Physician: Rolm Gala Treating Physician/Extender: Rudene Re in Treatment: 12 Visit Information History Since Last Visit Added or deleted any medications: No Patient Arrived: Wheel Chair Any new allergies or adverse reactions: No Arrival Time: 13:50 Had a fall or experienced change in No Accompanied By: dtr activities of daily living that may affect Transfer Assistance: Manual risk of falls: Patient Identification Verified: Yes Signs or symptoms of abuse/neglect since last No Secondary Verification Process Yes visito Completed: Hospitalized since last visit: No Patient Requires Transmission- No Pain Present Now: No Based Precautions: Patient Has Alerts: Yes Patient Alerts: Patient on Blood Thinner eliquis Electronic Signature(s) Signed: 12/07/2015 5:40:43 PM By: Curtis Sites Entered By: Curtis Sites on 12/07/2015 13:54:51 Dorin, Sehaj Elbert Ewings ([id number]) -------------------------------------------------------------------------------- Clinic Level of Care Assessment Details Patient Name: [name] Date of Service: 12/07/2015 1:45 PM Medical Record Number: [id number] Patient Account Number: [id number] Date of Birth/Sex: 09/10/1924 (82 y.o. Female) Treating RN: Afful, RN, BSN, Assaria Sink Primary Care Physician: Rolm Gala Other Clinician: Referring Physician: Rolm Gala Treating Physician/Extender: Rudene Re in Treatment: 12 Clinic Level of Care Assessment Items TOOL 4 Quantity Score []  - Use when only an EandM is performed on FOLLOW-UP visit 0 ASSESSMENTS - Nursing Assessment /  Reassessment X - Reassessment of Co-morbidities (includes updates in patient status) 1 10 X - Reassessment of Adherence to Treatment Plan 1 5 ASSESSMENTS - Wound and Skin Assessment / Reassessment X - Simple Wound Assessment / Reassessment - one wound 1 5 []  - Complex Wound Assessment / Reassessment - multiple wounds 0 []  - Dermatologic / Skin Assessment (not related to wound area) 0 ASSESSMENTS - Focused Assessment []  - Circumferential Edema Measurements - multi extremities 0 []  - Nutritional Assessment / Counseling / Intervention 0 X - Lower Extremity Assessment (monofilament, tuning fork, pulses) 1 5 []  - Peripheral Arterial Disease Assessment (using hand held doppler) 0 ASSESSMENTS - Ostomy and/or Continence Assessment and Care []  - Incontinence Assessment and Management 0 []  - Ostomy Care Assessment and Management (repouching, etc.) 0 PROCESS - Coordination of Care X - Simple Patient / Family Education for ongoing care 1 15 []  - Complex (extensive) Patient / Family Education for ongoing care 0 []  - Staff obtains Chiropractor, Records, Test Results / Process Orders 0 []  - Staff telephones HHA, Nursing Homes / Clarify orders / etc 0 []  - Routine Transfer to another Facility (non-emergent condition) 0 Teeple, Eron L. ([id number]) []  - Routine Hospital Admission (non-emergent condition) 0 []  - New Admissions / Manufacturing engineer / Ordering NPWT, Apligraf, etc. 0 []  - Emergency Hospital Admission (emergent condition) 0 []  - Simple Discharge Coordination 0 []  - Complex (extensive) Discharge Coordination 0 PROCESS - Special Needs []  - Pediatric / Minor Patient Management 0 []  - Isolation Patient Management 0 []  - Hearing / Language / Visual special needs 0 []  - Assessment of Community assistance (transportation, D/C planning, etc.) 0 []  - Additional assistance / Altered mentation 0 []  - Support Surface(s) Assessment (bed, cushion, seat, etc.) 0 INTERVENTIONS - Wound Cleansing /  Measurement X - Simple Wound Cleansing - one wound 1 5 []  - Complex Wound Cleansing - multiple wounds 0 X - Wound Imaging (photographs - any number of wounds) 1  5 []  - Wound Tracing (instead of photographs) 0 X - Simple Wound Measurement - one wound 1 5 []  - Complex Wound Measurement - multiple wounds 0 INTERVENTIONS - Wound Dressings X - Small Wound Dressing one or multiple wounds 1 10 []  - Medium Wound Dressing one or multiple wounds 0 []  - Large Wound Dressing one or multiple wounds 0 []  - Application of Medications - topical 0 []  - Application of Medications - injection 0 INTERVENTIONS - Miscellaneous []  - External ear exam 0 Struckman, Latronda L. ([id number]) []  - Specimen Collection (cultures, biopsies, blood, body fluids, etc.) 0 []  - Specimen(s) / Culture(s) sent or taken to Lab for analysis 0 []  - Patient Transfer (multiple staff / Michiel Sites Lift / Similar devices) 0 []  - Simple Staple / Suture removal (25 or less) 0 []  - Complex Staple / Suture removal (26 or more) 0 []  - Hypo / Hyperglycemic Management (close monitor of Blood Glucose) 0 []  - Ankle / Brachial Index (ABI) - do not check if billed separately 0 X - Vital Signs 1 5 Has the patient been seen at the hospital within the last three years: Yes Total Score: 70 Level Of Care: New/Established - Level 2 Electronic Signature(s) Signed: 12/07/2015 5:20:58 PM By: Elpidio Eric BSN, RN Entered By: Elpidio Eric on 12/07/2015 14:14:02 [name] ([id number]) -------------------------------------------------------------------------------- Encounter Discharge Information Details Patient Name: [name] Date of Service: 12/07/2015 1:45 PM Medical Record Number: [id number] Patient Account Number: [id number] Date of Birth/Sex: 05/24/1924 (82 y.o. Female) Treating RN: Curtis Sites Primary Care Physician: Rolm Gala Other Clinician: Referring Physician: Rolm Gala Treating Physician/Extender: Rudene Re in  Treatment: 12 Encounter Discharge Information Items Discharge Pain Level: 0 Discharge Condition: Stable Ambulatory Status: Wheelchair Discharge Destination: Home Transportation: Private Auto Accompanied By: dtr Schedule Follow-up Appointment: Yes Medication Reconciliation completed and provided to Patient/Care No Cloee [place]: Provided on Clinical Summary of Care: 12/07/2015 Form Type Recipient Paper Patient DI Electronic Signature(s) Signed: 12/07/2015 2:27:29 PM By: Curtis Sites Previous Signature: 12/07/2015 2:25:11 PM Version By: Gwenlyn Perking Entered By: Curtis Sites on 12/07/2015 14:27:29 [name] ([id number]) -------------------------------------------------------------------------------- Lower Extremity Assessment Details Patient Name: [name] Date of Service: 12/07/2015 1:45 PM Medical Record Number: [id number] Patient Account Number: [id number] Date of Birth/Sex: [birth year] (82 y.o. Female) Treating RN: Curtis Sites Primary Care Physician: Rolm Gala Other Clinician: Referring Physician: Rolm Gala Treating Physician/Extender: Rudene Re in Treatment: 12 Vascular Assessment Pulses: Posterior Tibial Dorsalis Pedis Palpable: [Right:Yes] Extremity colors, hair growth, and conditions: Extremity Color: [Right:Normal] Hair Growth on Extremity: [Right:Yes] Temperature of Extremity: [Right:Warm] Capillary Refill: [Right:< 3 seconds] Electronic Signature(s) Signed: 12/07/2015 5:40:43 PM By: Curtis Sites Entered By: Curtis Sites on 12/07/2015 14:01:21 Reames, Aalaysia Elbert Ewings ([id number]) -------------------------------------------------------------------------------- Multi Wound Chart Details Patient Name: [name] Date of Service: 12/07/2015 1:45 PM Medical Record Number: [id number] Patient Account Number: [id number] Date of Birth/Sex: April 30, 1924 (82 y.o. Female) Treating RN: Clover Mealy, RN, BSN, Moreland Sink Primary Care Physician: Rolm Gala Other Clinician: Referring Physician: Rolm Gala Treating Physician/Extender: Rudene Re in Treatment: 12 Vital Signs Height(in): 59 Pulse(bpm): 71 Weight(lbs): 108 Blood Pressure 145/67 (mmHg): Body Mass Index(BMI): 22 Temperature(F): 97.2 Respiratory Rate 16 (breaths/min): Photos: [N/A:N/A] Wound Location: Right Calcaneus N/A N/A Wounding Event: Pressure Injury N/A N/A Primary Etiology: Pressure Ulcer N/A N/A Comorbid History: Deep Vein Thrombosis, N/A N/A Hypertension, Dementia Date Acquired: 07/14/2015 N/A N/A Weeks of Treatment: 12 N/A N/A Wound Status: Open N/A N/A Measurements L x W x D 0.7x0.5x0.1  N/A N/A (cm) Area (cm) : 0.275 N/A N/A Volume (cm) : 0.027 N/A N/A % Reduction in Area: 95.00% N/A N/A % Reduction in Volume: 97.60% N/A N/A Classification: Category/Stage III N/A N/A Exudate Amount: Large N/A N/A Exudate Type: Serous N/A N/A Exudate Color: amber N/A N/A Wound Margin: Flat and Intact N/A N/A Granulation Amount: Large (67-100%) N/A N/A Granulation Quality: Red N/A N/A Necrotic Amount: Small (1-33%) N/A N/A Math, Amara L. ([id number]) Necrotic Tissue: Eschar, Adherent Slough N/A N/A Exposed Structures: Fascia: No N/A N/A Fat: No Tendon: No Muscle: No Joint: No Bone: No Limited to Skin Breakdown Epithelialization: None N/A N/A Periwound Skin Texture: Edema: No N/A N/A Excoriation: No Induration: No Callus: No Crepitus: No Fluctuance: No Friable: No Rash: No Scarring: No Periwound Skin Dry/Scaly: Yes N/A N/A Moisture: Maceration: No Moist: No Periwound Skin Color: Erythema: Yes N/A N/A Atrophie Blanche: No Cyanosis: No Ecchymosis: No Hemosiderin Staining: No Mottled: No Pallor: No Rubor: No Erythema Location: Circumferential N/A N/A Temperature: No Abnormality N/A N/A Tenderness on Yes N/A N/A Palpation: Wound Preparation: Ulcer Cleansing: N/A N/A Rinsed/Irrigated with Saline Topical  Anesthetic Applied: Other: lidocaine 4% Treatment Notes Electronic Signature(s) Signed: 12/07/2015 5:20:58 PM By: Elpidio EricAfful, Rita BSN, RN Entered By: Elpidio EricAfful, Rita on 12/07/2015 14:12:50 Colleen QualeISLEY, Samaia L. ([id number]) -------------------------------------------------------------------------------- Multi-Disciplinary Care Plan Details Patient Name: Colleen QualeISLEY, Maresa L. Date of Service: 12/07/2015 1:45 PM Medical Record Number: [id number] Patient Account Number: [id number] Date of Birth/Sex: 02/15/1924 (82 y.o. Female) Treating RN: Afful, RN, BSN, Regal Sinkita Primary Care Physician: Rolm GalaGrandis, Heidi Other Clinician: Referring Physician: Rolm GalaGrandis, Heidi Treating Physician/Extender: Rudene ReBritto, Errol Weeks in Treatment: 12 Active Inactive Abuse / Safety / Falls / Self Care Management Nursing Diagnoses: Impaired home maintenance Impaired physical mobility Potential for falls Self care deficit: actual or potential Goals: Patient will remain injury free Date Initiated: 09/14/2015 Goal Status: Active Patient/caregiver will verbalize understanding of skin care regimen Date Initiated: 09/14/2015 Goal Status: Active Patient/caregiver will verbalize/demonstrate measure taken to improve self care Date Initiated: 09/14/2015 Goal Status: Active Patient/caregiver will verbalize/demonstrate measures taken to improve the patient's personal safety Date Initiated: 09/14/2015 Goal Status: Active Patient/caregiver will verbalize/demonstrate measures taken to prevent injury and/or falls Date Initiated: 09/14/2015 Goal Status: Active Patient/caregiver will verbalize/demonstrate understanding of what to do in case of emergency Date Initiated: 09/14/2015 Goal Status: Active Interventions: Assess fall risk on admission and as needed Assess: immobility, friction, shearing, incontinence upon admission and as needed Assess impairment of mobility on admission and as needed per policy Assess self care needs on admission and as  needed Provide education on basic hygiene Provide education on personal and home safety Colleen QualeSLEY, Jamaya L. ([id number]) Provide education on safe transfers Notes: Orientation to the Wound Care Program Nursing Diagnoses: Knowledge deficit related to the wound healing center program Goals: Patient/caregiver will verbalize understanding of the Wound Healing Center Program Date Initiated: 09/14/2015 Goal Status: Active Interventions: Provide education on orientation to the wound center Notes: Pressure Nursing Diagnoses: Knowledge deficit related to causes and risk factors for pressure ulcer development Knowledge deficit related to management of pressures ulcers Potential for impaired tissue integrity related to pressure, friction, moisture, and shear Goals: Patient will remain free from development of additional pressure ulcers Date Initiated: 09/14/2015 Goal Status: Active Patient will remain free of pressure ulcers Date Initiated: 09/14/2015 Goal Status: Active Patient/caregiver will verbalize risk factors for pressure ulcer development Date Initiated: 09/14/2015 Goal Status: Active Patient/caregiver will verbalize understanding of pressure ulcer management Date Initiated: 09/14/2015 Goal Status: Active Interventions: Assess: immobility,  friction, shearing, incontinence upon admission and as needed Assess offloading mechanisms upon admission and as needed Assess potential for pressure ulcer upon admission and as needed Provide education on pressure ulcers Treatment Activities: Colleen QualeSLEY, Mahira L. ([id number]) Patient referred for pressure reduction/relief devices : 09/14/2015 Patient referred for seating evaluation to ensure proper offloading : 09/14/2015 Pressure reduction/relief device ordered : 09/14/2015 Notes: Wound/Skin Impairment Nursing Diagnoses: Impaired tissue integrity Knowledge deficit related to ulceration/compromised skin integrity Goals: Patient/caregiver will  verbalize understanding of skin care regimen Date Initiated: 09/14/2015 Goal Status: Active Ulcer/skin breakdown will have a volume reduction of 30% by week 4 Date Initiated: 09/14/2015 Goal Status: Active Ulcer/skin breakdown will have a volume reduction of 50% by week 8 Date Initiated: 09/14/2015 Goal Status: Active Ulcer/skin breakdown will have a volume reduction of 80% by week 12 Date Initiated: 09/14/2015 Goal Status: Active Ulcer/skin breakdown will heal within 14 weeks Date Initiated: 09/14/2015 Goal Status: Active Interventions: Assess patient/caregiver ability to obtain necessary supplies Assess patient/caregiver ability to perform ulcer/skin care regimen upon admission and as needed Assess ulceration(s) every visit Provide education on ulcer and skin care Treatment Activities: Referred to DME Ann Groeneveld for dressing supplies : 09/14/2015 Skin care regimen initiated : 09/14/2015 Topical wound management initiated : 09/14/2015 Notes: Electronic Signature(s) Signed: 12/07/2015 5:20:58 PM By: Elpidio EricAfful, Rita BSN, RN Entered By: Elpidio EricAfful, Rita on 12/07/2015 14:12:42 Colleen QualeISLEY, Shatana L. ([id number] Mallick) Carreon, [name] ([id number]) -------------------------------------------------------------------------------- Pain Assessment Details Patient Name: Colleen QualeISLEY, Alisa L. Date of Service: 12/07/2015 1:45 PM Medical Record Number: [id number] Patient Account Number: [id number] Date of Birth/Sex: 06/01/1924 (82 y.o. Female) Treating RN: Curtis Sitesorthy, Joanna Primary Care Physician: Rolm GalaGrandis, Heidi Other Clinician: Referring Physician: Rolm GalaGrandis, Heidi Treating Physician/Extender: Rudene ReBritto, Errol Weeks in Treatment: 12 Active Problems Location of Pain Severity and Description of Pain Patient Has Paino No Site Locations Pain Management and Medication Current Pain Management: Notes Topical or injectable lidocaine is offered to patient for acute pain when surgical debridement is performed. If needed, Patient is  instructed to use over the counter pain medication for the following 24-48 hours after debridement. Wound care MDs do not prescribed pain medications. Patient has chronic pain or uncontrolled pain. Patient has been instructed to make an appointment with their Primary Care Physician for pain management. Electronic Signature(s) Signed: 12/07/2015 5:40:43 PM By: Curtis Sitesorthy, Joanna Entered By: Curtis Sitesorthy, Joanna on 12/07/2015 13:54:59 Colleen QualeISLEY, Joyclyn L. ([id number]) -------------------------------------------------------------------------------- Patient/Caregiver Education Details Patient Name: Colleen QualeISLEY, Reet L. Date of Service: 12/07/2015 1:45 PM Medical Record Number: [id number] Patient Account Number: [id number] Date of Birth/Gender: 11/04/1924 (82 y.o. Female) Treating RN: Curtis Sitesorthy, Joanna Primary Care Physician: Rolm GalaGrandis, Heidi Other Clinician: Referring Physician: Rolm GalaGrandis, Heidi Treating Physician/Extender: Rudene ReBritto, Errol Weeks in Treatment: 12 Education Assessment Education Provided To: Caregiver Education Topics Provided Wound/Skin Impairment: Handouts: Other: wound care to continue as ordered Methods: Demonstration, Explain/Verbal Responses: State content correctly Electronic Signature(s) Signed: 12/07/2015 5:40:43 PM By: Curtis Sitesorthy, Joanna Entered By: Curtis Sitesorthy, Joanna on 12/07/2015 14:27:47 [place], [name] ([id number]) -------------------------------------------------------------------------------- Wound Assessment Details Patient Name: Colleen QualeISLEY, Samaa L. Date of Service: 12/07/2015 1:45 PM Medical Record Number: [id number] Patient Account Number: [id number] Date of Birth/Sex: 09/30/1924 (82 y.o. Female) Treating RN: Curtis Sitesorthy, Joanna Primary Care Physician: Rolm GalaGrandis, Heidi Other Clinician: Referring Physician: Rolm GalaGrandis, Heidi Treating Physician/Extender: Rudene ReBritto, Errol Weeks in Treatment: 12 Wound Status Wound Number: 1 Primary Pressure Ulcer Etiology: Wound Location: Right Calcaneus Wound  Status: Open Wounding Event: Pressure Injury Comorbid Deep Vein Thrombosis, Date Acquired: 07/14/2015 History: Hypertension, Dementia Weeks Of Treatment: 12 Clustered Wound: No Photos Wound Measurements Length: (  cm) 0.7 Width: (cm) 0.5 Depth: (cm) 0.1 Area: (cm) 0.275 Volume: (cm) 0.027 % Reduction in Area: 95% % Reduction in Volume: 97.6% Epithelialization: None Tunneling: No Undermining: No Wound Description Classification: Category/Stage III Wound Margin: Flat and Intact Exudate Amount: Large Exudate Type: Serous Exudate Color: amber Foul Odor After Cleansing: No Wound Bed Granulation Amount: Large (67-100%) Exposed Structure Granulation Quality: Red Fascia Exposed: No Necrotic Amount: Small (1-33%) Fat Layer Exposed: No Necrotic Quality: Eschar, Adherent Slough Tendon Exposed: No Muscle Exposed: No Sopko, Zaleigh L. ([id number]) Joint Exposed: No Bone Exposed: No Limited to Skin Breakdown Periwound Skin Texture Texture Color No Abnormalities Noted: No No Abnormalities Noted: No Callus: No Atrophie Blanche: No Crepitus: No Cyanosis: No Excoriation: No Ecchymosis: No Fluctuance: No Erythema: Yes Friable: No Erythema Location: Circumferential Induration: No Hemosiderin Staining: No Localized Edema: No Mottled: No Rash: No Pallor: No Scarring: No Rubor: No Moisture Temperature / Pain No Abnormalities Noted: No Temperature: No Abnormality Dry / Scaly: Yes Tenderness on Palpation: Yes Maceration: No Moist: No Wound Preparation Ulcer Cleansing: Rinsed/Irrigated with Saline Topical Anesthetic Applied: Other: lidocaine 4%, Treatment Notes Wound #1 (Right Calcaneus) 1. Cleansed with: Clean wound with Normal Saline 2. Anesthetic Topical Lidocaine 4% cream to wound bed prior to debridement 4. Dressing Applied: Other dressing (specify in notes) 5. Secondary Dressing Applied Guaze, ABD and kerlix/Conform Notes sorbact with hydrogel,  netting Electronic Signature(s) Signed: 12/07/2015 5:40:43 PM By: Curtis Sites Entered By: Curtis Sites on 12/07/2015 14:01:00 [name] ([id number]) -------------------------------------------------------------------------------- Vitals Details Patient Name: [name] Date of Service: 12/07/2015 1:45 PM Medical Record Number: [id number] Patient Account Number: [id number] Date of Birth/Sex: 05/10/1924 (82 y.o. Female) Treating RN: Curtis Sites Primary Care Physician: Rolm Gala Other Clinician: Referring Physician: Rolm Gala Treating Physician/Extender: Rudene Re in Treatment: 12 Vital Signs Time Taken: 13:55 Temperature (F): 97.2 Height (in): 59 Pulse (bpm): 71 Weight (lbs): 108 Respiratory Rate (breaths/min): 16 Body Mass Index (BMI): 21.8 Blood Pressure (mmHg): 145/67 Reference Range: 80 - 120 mg / dl Electronic Signature(s) Signed: 12/07/2015 5:40:43 PM By: Curtis Sites Entered By: Curtis Sites on 12/07/2015 13:55:20

## 2015-12-08 NOTE — Progress Notes (Signed)
[name] ([id number]) Visit Report for 12/07/2015 Chief Complaint Document Details Patient Name: [name] Date of Service: 12/07/2015 1:45 PM Medical Record Number: [id number] Patient Account Number: [id number] Date of Birth/Sex: 01/02/1925 (82 y.o. Female) Treating RN: Curtis Sites Primary Care Physician: Rolm Gala Other Clinician: Referring Physician: Rolm Gala Treating Physician/Extender: Rudene Re in Treatment: 12 Information Obtained from: Patient Chief Complaint Patient is at the clinic for treatment of an open pressure ulcer right heel which she's had for about 8 weeks Electronic Signature(s) Signed: 12/07/2015 2:14:25 PM By: Evlyn Kanner MD, FACS Entered By: Evlyn Kanner on 12/07/2015 14:14:24 [name] ([id number]) -------------------------------------------------------------------------------- HPI Details Patient Name: [name] Date of Service: 12/07/2015 1:45 PM Medical Record Number: [id number] Patient Account Number: [id number] Date of Birth/Sex: 1924-08-06 (82 y.o. Female) Treating RN: Curtis Sites Primary Care Physician: Rolm Gala Other Clinician: Referring Physician: Rolm Gala Treating Physician/Extender: Rudene Re in Treatment: 12 History of Present Illness Location: right heel Quality: Patient reports experiencing a dull pain to affected area(s). Severity: Patient states wound are getting worse. Duration: Patient has had the wound for < 8 weeks prior to presenting for treatment Timing: Pain in wound is Intermittent (comes and goes Context: The wound occurred when the patient the hip fracture and was in traction for a few days while in hospital Modifying Factors: Other treatment(s) tried include:local care symptomatic treatment Associated Signs and Symptoms: Patient reports having increase discharge. HPI Description: 82 year old patient has been referred to as by her PCP for a pressure  ulcer of the right heel. his had this for about 8 weeks Has been treated with duoderm. Has been there for a while it's getting more painful and larger and she also was seen for a recent urinary tract infection. She was given ciprofloxacin for this and then changed on to nitrofurantoin. She has a past medical history of rheumatic fever, hyperlipidemia, hypertension, scoliosis and osteopenia. never been a smoker. 10/05/2015 -- x-ray of the right heel -- IMPRESSION:No osseous finding of significance. Abnormal appearance of the soft tissues posterior to the heel which could be due to infection or trauma. I do not see an intact Achilles tendon shadow. 12/07/2015 -- the patient has been approved for grafix and we will get this ready for her next week Electronic Signature(s) Signed: 12/07/2015 2:15:10 PM By: Evlyn Kanner MD, FACS Entered By: Evlyn Kanner on 12/07/2015 14:15:09 [name] ([id number]) -------------------------------------------------------------------------------- Physical Exam Details Patient Name: [name] Date of Service: 12/07/2015 1:45 PM Medical Record Number: [id number] Patient Account Number: [id number] Date of Birth/Sex: [birth year] (82 y.o. Female) Treating RN: Curtis Sites Primary Care Physician: Rolm Gala Other Clinician: Referring Physician: Rolm Gala Treating Physician/Extender: Rudene Re in Treatment: 12 Constitutional . Pulse regular. Respirations normal and unlabored. Afebrile. . Eyes Nonicteric. Reactive to light. Ears, Nose, Mouth, and Throat Lips, teeth, and gums WNL.Marland Kitchen Moist mucosa without lesions. Neck supple and nontender. No palpable supraclavicular or cervical adenopathy. Normal sized without goiter. Respiratory WNL. No retractions.. Breath sounds WNL, No rubs, rales, rhonchi, or wheeze.. Cardiovascular Heart rhythm and rate regular, no murmur or gallop.. Pedal Pulses WNL. No clubbing, cyanosis or  edema. Chest Breasts symmetical and no nipple discharge.. Breast tissue WNL, no masses, lumps, or tenderness.. Lymphatic No adneopathy. No adenopathy. No adenopathy. Musculoskeletal Adexa without tenderness or enlargement.. Digits and nails w/o clubbing, cyanosis, infection, petechiae, ischemia, or inflammatory conditions.. Integumentary (Hair, Skin) No suspicious lesions. No crepitus or fluctuance. No peri-wound warmth or erythema. No  masses.Marland Kitchen Psychiatric Judgement and insight Intact.. No evidence of depression, anxiety, or agitation.. Notes the wound is looking very clean and has healthy granulation tissue but has some depth and at this stage no sharp debridement was required today. Electronic Signature(s) Signed: 12/07/2015 2:15:37 PM By: Evlyn Kanner MD, FACS Entered By: Evlyn Kanner on 12/07/2015 14:15:37 [name] ([id number]) -------------------------------------------------------------------------------- Physician Orders Details Patient Name: [name] Date of Service: 12/07/2015 1:45 PM Medical Record Number: [id number] Patient Account Number: [id number] Date of Birth/Sex: [birth year] (82 y.o. Female) Treating RN: Afful, RN, BSN, Elwood Sink Primary Care Physician: Rolm Gala Other Clinician: Referring Physician: Rolm Gala Treating Physician/Extender: Rudene Re in Treatment: 37 Verbal / Phone Orders: Yes Clinician: Afful, RN, BSN, Rita Read Back and Verified: Yes Diagnosis Coding Wound Cleansing Wound #1 Right Calcaneus o Cleanse wound with mild soap and water o May Shower, gently pat wound dry prior to applying new dressing. o May shower with protection. Anesthetic Wound #1 Right Calcaneus o Topical Lidocaine 4% cream applied to wound bed prior to debridement - In Clinic Skin Barriers/Peri-Wound Care Wound #1 Right Calcaneus o Other: - may apply TCA cream to peri wound skin for itching Primary Wound Dressing Wound #1 Right  Calcaneus o Other: - sorbact with hydrogel - sorbact was given to patient Secondary Dressing Wound #1 Right Calcaneus o Gauze and Kerlix/Conform - netting Dressing Change Frequency Wound #1 Right Calcaneus o Three times weekly - For Home Health Specifics Follow-up Appointments Wound #1 Right Calcaneus o Return Appointment in 1 week. Off-Loading Wound #1 Right Calcaneus o Heel suspension boot to: [name], [name]. ([id number]) Additional Orders / Instructions Wound #1 Right Calcaneus o Increase protein intake. o Activity as tolerated o Other: - Include these vitamins in your diet, MVI, ZINC, Vit C, Vit A Home Health Wound #1 Right Calcaneus o Continue Home Health Visits - [facility] o Home Health Nurse may visit PRN to address patientos wound care needs. o FACE TO FACE ENCOUNTER: MEDICARE and MEDICAID PATIENTS: I certify that this patient is under my care and that I had a face-to-face encounter that meets the physician face-to-face encounter requirements with this patient on this date. The encounter with the patient was in whole or in part for the following MEDICAL CONDITION: (primary reason for Home Healthcare) MEDICAL NECESSITY: I certify, that based on my findings, NURSING services are a medically necessary home health service. HOME BOUND STATUS: I certify that my clinical findings support that this patient is homebound (i.e., Due to illness or injury, pt requires aid of supportive devices such as crutches, cane, wheelchairs, walkers, the use of special transportation or the assistance of another person to leave their place of residence. There is a normal inability to leave the home and doing so requires considerable and taxing effort. Other absences are for medical reasons / religious services and are infrequent or of short duration when for other reasons). o If current dressing causes regression in wound condition, may D/C ordered dressing  product/s and apply Normal Saline Moist Dressing daily until next Wound Healing Center / Other MD appointment. Notify Wound Healing Center of regression in wound condition at [phone]. o Please direct any NON-WOUND related issues/requests for orders to patient's Primary Care Physician Notes order grafix...smallest size Electronic Signature(s) Signed: 12/07/2015 4:11:15 PM By: Evlyn Kanner MD, FACS Signed: 12/07/2015 5:20:58 PM By: Elpidio Eric BSN, RN Entered By: Elpidio Eric on 12/07/2015 14:13:27 [place], Sheneika Elbert Ewings ([id number]) -------------------------------------------------------------------------------- Problem List Details Patient Name: Ernest Mallick,  Stavroula Chavez. Date of Service: 12/07/2015 1:45 PM Medical Record Number: [id number] Patient Account Number: [id number] Date of Birth/Sex: 11/26/1924 (82 y.o. Female) Treating RN: Curtis Sitesorthy, Joanna Primary Care Physician: Rolm GalaGrandis, Heidi Other Clinician: Referring Physician: Rolm GalaGrandis, Heidi Treating Physician/Extender: Rudene ReBritto, Vianca Bracher Weeks in Treatment: 12 Active Problems ICD-10 Encounter Code Description Active Date Diagnosis L89.613 Pressure ulcer of right heel, stage 3 09/14/2015 Yes E44.1 Mild protein-calorie malnutrition 09/14/2015 Yes Inactive Problems Resolved Problems Electronic Signature(s) Signed: 12/07/2015 2:14:13 PM By: Evlyn KannerBritto, Coralyn Roselli MD, FACS Entered By: Evlyn KannerBritto, Mariadelaluz Guggenheim on 12/07/2015 14:14:13 [name], [name]. ([id number]) -------------------------------------------------------------------------------- Progress Note Details Patient Name: [name], Maja Chavez. Date of Service: 12/07/2015 1:45 PM Medical Record Number: [id number] Patient Account Number: [id number] Date of Birth/Sex: 09/20/1924 (82 y.o. Female) Treating RN: Curtis Sitesorthy, Joanna Primary Care Physician: Rolm GalaGrandis, Heidi Other Clinician: Referring Physician: Rolm GalaGrandis, Heidi Treating Physician/Extender: Rudene ReBritto, Clint Strupp Weeks in Treatment: 12 Subjective Chief Complaint Information  obtained from Patient Patient is at the clinic for treatment of an open pressure ulcer right heel which she's had for about 8 weeks History of Present Illness (HPI) The following HPI elements were documented for the patient's wound: Location: right heel Quality: Patient reports experiencing a dull pain to affected area(s). Severity: Patient states wound are getting worse. Duration: Patient has had the wound for < 8 weeks prior to presenting for treatment Timing: Pain in wound is Intermittent (comes and goes Context: The wound occurred when the patient the hip fracture and was in traction for a few days while in hospital Modifying Factors: Other treatment(s) tried include:local care symptomatic treatment Associated Signs and Symptoms: Patient reports having increase discharge. 82 year old patient has been referred to as by her PCP for a pressure ulcer of the right heel. his had this for about 8 weeks Has been treated with duoderm. Has been there for a while it's getting more painful and larger and she also was seen for a recent urinary tract infection. She was given ciprofloxacin for this and then changed on to nitrofurantoin. She has a past medical history of rheumatic fever, hyperlipidemia, hypertension, scoliosis and osteopenia. never been a smoker. 10/05/2015 -- x-ray of the right heel -- IMPRESSION:No osseous finding of significance. Abnormal appearance of the soft tissues posterior to the heel which could be due to infection or trauma. I do not see an intact Achilles tendon shadow. 12/07/2015 -- the patient has been approved for grafix and we will get this ready for her next week Objective Constitutional Pulse regular. Respirations normal and unlabored. Afebrile. Wenke, Colleen LMarland Kitchen. ([id number]) Vitals Time Taken: 1:55 PM, Height: 59 in, Weight: 108 lbs, BMI: 21.8, Temperature: 97.2 F, Pulse: 71 bpm, Respiratory Rate: 16 breaths/min, Blood Pressure: 145/67 mmHg. Eyes Nonicteric.  Reactive to light. Ears, Nose, Mouth, and Throat Lips, teeth, and gums WNL.Marland Kitchen. Moist mucosa without lesions. Neck supple and nontender. No palpable supraclavicular or cervical adenopathy. Normal sized without goiter. Respiratory WNL. No retractions.. Breath sounds WNL, No rubs, rales, rhonchi, or wheeze.. Cardiovascular Heart rhythm and rate regular, no murmur or gallop.. Pedal Pulses WNL. No clubbing, cyanosis or edema. Chest Breasts symmetical and no nipple discharge.. Breast tissue WNL, no masses, lumps, or tenderness.. Lymphatic No adneopathy. No adenopathy. No adenopathy. Musculoskeletal Adexa without tenderness or enlargement.. Digits and nails w/o clubbing, cyanosis, infection, petechiae, ischemia, or inflammatory conditions.Marland Kitchen. Psychiatric Judgement and insight Intact.. No evidence of depression, anxiety, or agitation.. General Notes: the wound is looking very clean and has healthy granulation tissue but has some depth and at this stage no sharp debridement  was required today. Integumentary (Hair, Skin) No suspicious lesions. No crepitus or fluctuance. No peri-wound warmth or erythema. No masses.. Wound #1 status is Open. Original cause of wound was Pressure Injury. The wound is located on the Right Calcaneus. The wound measures 0.7cm length x 0.5cm width x 0.1cm depth; 0.275cm^2 area and 0.027cm^3 volume. The wound is limited to skin breakdown. There is no tunneling or undermining noted. There is a large amount of serous drainage noted. The wound margin is flat and intact. There is large (67- 100%) red granulation within the wound bed. There is a small (1-33%) amount of necrotic tissue within the wound bed including Eschar and Adherent Slough. The periwound skin appearance exhibited: Dry/Scaly, Erythema. The periwound skin appearance did not exhibit: Callus, Crepitus, Excoriation, Fluctuance, Friable, Induration, Localized Edema, Rash, Scarring, Maceration, Moist, Atrophie Blanche,  Cyanosis, Ecchymosis, Hemosiderin Staining, Mottled, Pallor, Rubor. The surrounding wound skin color is noted with erythema which is circumferential. Periwound temperature was noted as No Abnormality. The periwound [name], [name]. ([id number]) has tenderness on palpation. Assessment Active Problems ICD-10 L89.613 - Pressure ulcer of right heel, stage 3 E44.1 - Mild protein-calorie malnutrition Plan Wound Cleansing: Wound #1 Right Calcaneus: Cleanse wound with mild soap and water May Shower, gently pat wound dry prior to applying new dressing. May shower with protection. Anesthetic: Wound #1 Right Calcaneus: Topical Lidocaine 4% cream applied to wound bed prior to debridement - In Clinic Skin Barriers/Peri-Wound Care: Wound #1 Right Calcaneus: Other: - may apply TCA cream to peri wound skin for itching Primary Wound Dressing: Wound #1 Right Calcaneus: Other: - sorbact with hydrogel - sorbact was given to patient Secondary Dressing: Wound #1 Right Calcaneus: Gauze and Kerlix/Conform - netting Dressing Change Frequency: Wound #1 Right Calcaneus: Three times weekly - For Home Health Specifics Follow-up Appointments: Wound #1 Right Calcaneus: Return Appointment in 1 week. Off-Loading: Wound #1 Right Calcaneus: Heel suspension boot to: - [employer] Additional Orders / Instructions: Wound #1 Right Calcaneus: Increase protein intake. Activity as tolerated [name], [name]. ([id number]) Other: - Include these vitamins in your diet, MVI, ZINC, Vit C, Vit A Home Health: Wound #1 Right Calcaneus: [facility] Nurse may visit PRN to address patient s wound care needs. FACE TO FACE ENCOUNTER: MEDICARE and MEDICAID PATIENTS: I certify that this patient is under my care and that I had a face-to-face encounter that meets the physician face-to-face encounter requirements with this patient on this date. The encounter with the patient was in whole or  in part for the following MEDICAL CONDITION: (primary reason for Home Healthcare) MEDICAL NECESSITY: I certify, that based on my findings, NURSING services are a medically necessary home health service. HOME BOUND STATUS: I certify that my clinical findings support that this patient is homebound (i.e., Due to illness or injury, pt requires aid of supportive devices such as crutches, cane, wheelchairs, walkers, the use of special transportation or the assistance of another person to leave their place of residence. There is a normal inability to leave the home and doing so requires considerable and taxing effort. Other absences are for medical reasons / religious services and are infrequent or of short duration when for other reasons). If current dressing causes regression in wound condition, may D/C ordered dressing product/s and apply Normal Saline Moist Dressing daily until next Wound Healing Center / Other MD appointment. Notify Wound Healing Center of regression in wound condition at [phone]. Please direct any NON-WOUND related issues/requests for  orders to patient's Primary Care Physician General Notes: order grafix...smallest size I have recommended: 1. Sorbact with hydrogel locally to be applied every other day. 2. Offloading has been discussed in great detail 3. Adequate protein, vitamin A, vitamin C and zinc 4. regular visits to the wound center at this stage we have treated her conservatively for a long while and she has now healthy granulation tissue which will benefit a great deal from a cellular or tissue base product hasten the process of healing. We will get Grafix for her next week Electronic Signature(s) Signed: 12/07/2015 2:17:08 PM By: Evlyn Kanner MD, FACS Entered By: Evlyn Kanner on 12/07/2015 14:17:08 [name] ([id number]) -------------------------------------------------------------------------------- SuperBill Details Patient Name: [name] Date of Service: 12/07/2015 Medical Record Number: [id number] Patient Account Number: [id number] Date of Birth/Sex: 1924-05-09 (82 y.o. Female) Treating RN: Curtis Sites Primary Care Physician: Rolm Gala Other Clinician: Referring Physician: Rolm Gala Treating Physician/Extender: Rudene Re in Treatment: 12 Diagnosis Coding ICD-10 Codes Code Description [id number] Pressure ulcer of right heel, stage 3 E44.1 Mild protein-calorie malnutrition Facility Procedures CPT4 Code: [id number] Description: [phone] - WOUND CARE VISIT-LEV 2 EST PT Modifier: Quantity: 1 Physician Procedures CPT4 Code: [id number] Description: 99213 - WC PHYS LEVEL 3 - EST PT ICD-10 Description Diagnosis L89.613 Pressure ulcer of right heel, stage 3 E44.1 Mild protein-calorie malnutrition Modifier: Quantity: 1 Electronic Signature(s) Signed: 12/07/2015 2:17:29 PM By: Evlyn Kanner MD, FACS Entered By: Evlyn Kanner on 12/07/2015 14:17:29

## 2015-12-14 ENCOUNTER — Encounter: Payer: Medicare Other | Admitting: Surgery

## 2015-12-14 DIAGNOSIS — L89613 Pressure ulcer of right heel, stage 3: Secondary | ICD-10-CM | POA: Diagnosis not present

## 2015-12-15 NOTE — Progress Notes (Signed)
[name] ([id number]) Visit Report for 12/14/2015 Chief Complaint Document Details Patient Name: [name], [name] Date of Service: 12/14/2015 2:30 PM Medical Record Number: [id number] Patient Account Number: [id number] Date of Birth/Sex: 1924-11-07 (82 y.o. Female) Treating RN: Huel Coventry Primary Care Physician: Rolm Gala Other Clinician: Referring Physician: Rolm Gala Treating Physician/Extender: Rudene Re in Treatment: 13 Information Obtained from: Patient Chief Complaint Patient is at the clinic for treatment of an open pressure ulcer right heel which she's had for about 8 weeks Electronic Signature(s) Signed: 12/14/2015 4:28:37 PM By: Evlyn Kanner MD, FACS Entered By: Evlyn Kanner on 12/14/2015 16:28:37 [name] ([id number]) -------------------------------------------------------------------------------- Cellular or Tissue Based Product Details Patient Name: [name] Date of Service: 12/14/2015 2:30 PM Medical Record Number: [id number] Patient Account Number: [id number] Date of Birth/Sex: 24-Apr-1924 (82 y.o. Female) Treating RN: Huel Coventry Primary Care Physician: Rolm Gala Other Clinician: Referring Physician: Rolm Gala Treating Physician/Extender: Rudene Re in Treatment: 13 Cellular or Tissue Based Wound #1 Right Calcaneus Product Type Applied to: Performed By: Physician Evlyn Kanner, MD Cellular or Tissue Based Grafix prime Product Type: Pre-procedure Yes - 15:00 Verification/Time Out Taken: Location: genitalia / hands / feet / multiple digits Wound Size (sq cm): 0.12 Product Size (sq cm): 2 Waste Size (sq cm): 0 Amount of Product Applied (sq cm): 2 Lot #: [id number] Expiration Date: 09/08/2018 Fenestrated: No Reconstituted: No Secured: Yes Secured With: Steri-Strips Dressing Applied: Yes Primary Dressing: abd Procedural Pain: 0 Post Procedural Pain: 0 Response to Treatment: Procedure was tolerated  well Post Procedure Diagnosis Same as Pre-procedure Electronic Signature(s) Signed: 12/14/2015 4:28:25 PM By: Evlyn Kanner MD, FACS Entered By: Evlyn Kanner on 12/14/2015 13:24:40 [name] ([id number]) -------------------------------------------------------------------------------- HPI Details Patient Name: [name] Date of Service: 12/14/2015 2:30 PM Medical Record Number: [id number] Patient Account Number: [id number] Date of Birth/Sex: Nov 26, 1924 (82 y.o. Female) Treating RN: Huel Coventry Primary Care Physician: Rolm Gala Other Clinician: Referring Physician: Rolm Gala Treating Physician/Extender: Rudene Re in Treatment: 13 History of Present Illness Location: right heel Quality: Patient reports experiencing a dull pain to affected area(s). Severity: Patient states wound are getting worse. Duration: Patient has had the wound for < 8 weeks prior to presenting for treatment Timing: Pain in wound is Intermittent (comes and goes Context: The wound occurred when the patient the hip fracture and was in traction for a few days while in hospital Modifying Factors: Other treatment(s) tried include:local care symptomatic treatment Associated Signs and Symptoms: Patient reports having increase discharge. HPI Description: 82 year old patient has been referred to as by her PCP for a pressure ulcer of the right heel. his had this for about 8 weeks Has been treated with duoderm. Has been there for a while it's getting more painful and larger and she also was seen for a recent urinary tract infection. She was given ciprofloxacin for this and then changed on to nitrofurantoin. She has a past medical history of rheumatic fever, hyperlipidemia, hypertension, scoliosis and osteopenia. never been a smoker. 10/05/2015 -- x-ray of the right heel -- IMPRESSION:No osseous finding of significance. Abnormal appearance of the soft tissues posterior to the heel which could be  due to infection or trauma. I do not see an intact Achilles tendon shadow. 12/07/2015 -- the patient has been approved for grafix and we will get this ready for her next week. 12/14/2015 -- she is here for her first application of Grafix Electronic Signature(s) Signed: 12/14/2015 4:28:56 PM By: Evlyn Kanner MD, FACS  Entered By: Evlyn Kanner on 12/14/2015 16:28:56 [name] ([id number]) -------------------------------------------------------------------------------- Physical Exam Details Patient Name: [name] Date of Service: 12/14/2015 2:30 PM Medical Record Number: [id number] Patient Account Number: [id number] Date of Birth/Sex: 03/20/1924 (82 y.o. Female) Treating RN: Huel Coventry Primary Care Physician: Rolm Gala Other Clinician: Referring Physician: Rolm Gala Treating Physician/Extender: Rudene Re in Treatment: 13 Constitutional . Pulse regular. Respirations normal and unlabored. Afebrile. . Eyes Nonicteric. Reactive to light. Ears, Nose, Mouth, and Throat Lips, teeth, and gums WNL.Marland Kitchen Moist mucosa without lesions. Neck supple and nontender. No palpable supraclavicular or cervical adenopathy. Normal sized without goiter. Respiratory WNL. No retractions.. Breath sounds WNL, No rubs, rales, rhonchi, or wheeze.. Cardiovascular Heart rhythm and rate regular, no murmur or gallop.. Pedal Pulses WNL. No clubbing, cyanosis or edema. Chest Breasts symmetical and no nipple discharge.. Breast tissue WNL, no masses, lumps, or tenderness.. Lymphatic No adneopathy. No adenopathy. No adenopathy. Musculoskeletal Adexa without tenderness or enlargement.. Digits and nails w/o clubbing, cyanosis, infection, petechiae, ischemia, or inflammatory conditions.. Integumentary (Hair, Skin) No suspicious lesions. No crepitus or fluctuance. No peri-wound warmth or erythema. No masses.Marland Kitchen Psychiatric Judgement and insight Intact.. No evidence of depression, anxiety, or  agitation.. Notes wound bed is prepared nicely after using a #3 curet and once this was cleaned I have applied graphics with the usual precautions and bolstered it in place. Electronic Signature(s) Signed: 12/14/2015 4:29:28 PM By: Evlyn Kanner MD, FACS Entered By: Evlyn Kanner on 12/14/2015 16:29:27 [name] ([id number]) -------------------------------------------------------------------------------- Physician Orders Details Patient Name: [name] Date of Service: 12/14/2015 2:30 PM Medical Record Number: [id number] Patient Account Number: [id number] Date of Birth/Sex: April 20, 1924 (82 y.o. Female) Treating RN: Huel Coventry Primary Care Physician: Rolm Gala Other Clinician: Referring Physician: Rolm Gala Treating Physician/Extender: Rudene Re in Treatment: 75 Verbal / Phone Orders: Yes Clinician: Huel Coventry Read Back and Verified: Yes Diagnosis Coding Wound Cleansing Wound #1 Right Calcaneus o Clean wound with Normal Saline. Anesthetic Wound #1 Right Calcaneus o Topical Lidocaine 4% cream applied to wound bed prior to debridement Skin Barriers/Peri-Wound Care Wound #1 Right Calcaneus o Skin Prep Primary Wound Dressing Wound #1 Right Calcaneus o ABD Pad Secondary Dressing Wound #1 Right Calcaneus o Conform/Kerlix - Coban Dressing Change Frequency Wound #1 Right Calcaneus o Other: - leave dressing in place Follow-up Appointments Wound #1 Right Calcaneus o Return Appointment in 1 week. Home Health Wound #1 Right Calcaneus o Continue Home Health Visits - Graphix applied. Hold wound care this week. o Home Health Nurse may visit PRN to address patientos wound care needs. o FACE TO FACE ENCOUNTER: MEDICARE and MEDICAID PATIENTS: I certify that this patient is under my care and that I had a face-to-face encounter that meets the physician face-to-face encounter requirements with this patient on this date. The encounter with  the patient was in [place], Alyss L. ([id number]) whole or in part for the following MEDICAL CONDITION: (primary reason for Home Healthcare) MEDICAL NECESSITY: I certify, that based on my findings, NURSING services are a medically necessary home health service. HOME BOUND STATUS: I certify that my clinical findings support that this patient is homebound (i.e., Due to illness or injury, pt requires aid of supportive devices such as crutches, cane, wheelchairs, walkers, the use of special transportation or the assistance of another person to leave their place of residence. There is a normal inability to leave the home and doing so requires considerable and taxing effort. Other absences are for medical reasons /  religious services and are infrequent or of short duration when for other reasons). o If current dressing causes regression in wound condition, may D/C ordered dressing product/s and apply Normal Saline Moist Dressing daily until next Wound Healing Center / Other MD appointment. Notify Wound Healing Center of regression in wound condition at [phone]. o Please direct any NON-WOUND related issues/requests for orders to patient's Primary Care Physician Advanced Therapies Wound #1 Right Calcaneus o Grafix Prime application in clinic; including contact layer, fixation with steri strips, dry gauze and cover dressing. Notes order grafix...smallest size Electronic Signature(s) Signed: 12/14/2015 5:08:19 PM By: Evlyn KannerBritto, Valeri Sula MD, FACS Signed: 12/14/2015 5:39:47 PM By: Elliot GurneyWoody, RN, BSN, Kim RN, BSN Entered By: Elliot GurneyWoody, RN, BSN, Kim on 12/14/2015 15:18:33 Colleen QualeISLEY, Malai L. ([id number]) -------------------------------------------------------------------------------- Problem List Details Patient Name: Colleen QualeISLEY, Georgine L. Date of Service: 12/14/2015 2:30 PM Medical Record Number: [id number] Patient Account Number: [id number] Date of Birth/Sex: 10/09/1924 (82 y.o. Female) Treating RN: Huel CoventryWoody,  Kim Primary Care Physician: Rolm GalaGrandis, Heidi Other Clinician: Referring Physician: Rolm GalaGrandis, Heidi Treating Physician/Extender: Rudene ReBritto, Kian Gamarra Weeks in Treatment: 13 Active Problems ICD-10 Encounter Code Description Active Date Diagnosis L89.613 Pressure ulcer of right heel, stage 3 09/14/2015 Yes E44.1 Mild protein-calorie malnutrition 09/14/2015 Yes Inactive Problems Resolved Problems Electronic Signature(s) Signed: 12/14/2015 4:27:59 PM By: Evlyn KannerBritto, Bookert Guzzi MD, FACS Entered By: Evlyn KannerBritto, Jaiona Simien on 12/14/2015 16:27:59 Colleen QualeISLEY, Maekayla L. ([id number]) -------------------------------------------------------------------------------- Progress Note Details Patient Name: Colleen QualeISLEY, Havanna L. Date of Service: 12/14/2015 2:30 PM Medical Record Number: [id number] Patient Account Number: [id number] Date of Birth/Sex: 08/09/1924 (82 y.o. Female) Treating RN: Huel CoventryWoody, Kim Primary Care Physician: Rolm GalaGrandis, Heidi Other Clinician: Referring Physician: Rolm GalaGrandis, Heidi Treating Physician/Extender: Rudene ReBritto, Pamila Mendibles Weeks in Treatment: 13 Subjective Chief Complaint Information obtained from Patient Patient is at the clinic for treatment of an open pressure ulcer right heel which she's had for about 8 weeks History of Present Illness (HPI) The following HPI elements were documented for the patient's wound: Location: right heel Quality: Patient reports experiencing a dull pain to affected area(s). Severity: Patient states wound are getting worse. Duration: Patient has had the wound for < 8 weeks prior to presenting for treatment Timing: Pain in wound is Intermittent (comes and goes Context: The wound occurred when the patient the hip fracture and was in traction for a few days while in hospital Modifying Factors: Other treatment(s) tried include:local care symptomatic treatment Associated Signs and Symptoms: Patient reports having increase discharge. 82 year old patient has been referred to as by her PCP for a pressure  ulcer of the right heel. his had this for about 8 weeks Has been treated with duoderm. Has been there for a while it's getting more painful and larger and she also was seen for a recent urinary tract infection. She was given ciprofloxacin for this and then changed on to nitrofurantoin. She has a past medical history of rheumatic fever, hyperlipidemia, hypertension, scoliosis and osteopenia. never been a smoker. 10/05/2015 -- x-ray of the right heel -- IMPRESSION:No osseous finding of significance. Abnormal appearance of the soft tissues posterior to the heel which could be due to infection or trauma. I do not see an intact Achilles tendon shadow. 12/07/2015 -- the patient has been approved for grafix and we will get this ready for her next week. 12/14/2015 -- she is here for her first application of Grafix Objective Constitutional [place], Katianne L. ([id number]) Pulse regular. Respirations normal and unlabored. Afebrile. Vitals Time Taken: 2:20 PM, Height: 59 in, Weight: 108 lbs, BMI: 21.8, Temperature: 97.8 F, Pulse: 82  bpm, Respiratory Rate: 16 breaths/min, Blood Pressure: 130/66 mmHg. Eyes Nonicteric. Reactive to light. Ears, Nose, Mouth, and Throat Lips, teeth, and gums WNL.Marland Kitchen. Moist mucosa without lesions. Neck supple and nontender. No palpable supraclavicular or cervical adenopathy. Normal sized without goiter. Respiratory WNL. No retractions.. Breath sounds WNL, No rubs, rales, rhonchi, or wheeze.. Cardiovascular Heart rhythm and rate regular, no murmur or gallop.. Pedal Pulses WNL. No clubbing, cyanosis or edema. Chest Breasts symmetical and no nipple discharge.. Breast tissue WNL, no masses, lumps, or tenderness.. Lymphatic No adneopathy. No adenopathy. No adenopathy. Musculoskeletal Adexa without tenderness or enlargement.. Digits and nails w/o clubbing, cyanosis, infection, petechiae, ischemia, or inflammatory conditions.Marland Kitchen. Psychiatric Judgement and insight Intact.. No  evidence of depression, anxiety, or agitation.. General Notes: wound bed is prepared nicely after using a #3 curet and once this was cleaned I have applied graphics with the usual precautions and bolstered it in place. Integumentary (Hair, Skin) No suspicious lesions. No crepitus or fluctuance. No peri-wound warmth or erythema. No masses.. Wound #1 status is Open. Original cause of wound was Pressure Injury. The wound is located on the Right Calcaneus. The wound measures 0.4cm length x 0.3cm width x 0.3cm depth; 0.094cm^2 area and 0.028cm^3 volume. There is fat exposed. There is no tunneling or undermining noted. There is a large amount of serous drainage noted. The wound margin is flat and intact. There is medium (34-66%) red granulation within the wound bed. There is a small (1-33%) amount of necrotic tissue within the wound bed including Adherent Slough. The periwound skin appearance exhibited: Scarring, Dry/Scaly, Maceration, Erythema. The periwound skin appearance did not exhibit: Callus, Crepitus, Excoriation, Fluctuance, Friable, Induration, Localized Edema, Rash, Moist, Atrophie Blanche, Cyanosis, Ecchymosis, Hemosiderin Staining, Mottled, Pallor, Rubor. The surrounding wound skin color is noted with erythema which is circumferential. Periwound temperature was noted as No Abnormality. The periwound has tenderness on Kot, Jalia L. ([id number]) palpation. Assessment Active Problems ICD-10 L89.613 - Pressure ulcer of right heel, stage 3 E44.1 - Mild protein-calorie malnutrition Procedures Wound #1 Wound #1 is a Pressure Ulcer located on the Right Calcaneus. A skin graft procedure using a bioengineered skin substitute/cellular or tissue based product was performed by Evlyn KannerBritto, Demetric Dunnaway, MD. Grafix prime was applied and secured with Steri-Strips. 2 sq cm of product was utilized and 0 sq cm was wasted. Post Application, abd was applied. A Time Out was conducted at 15:00, prior to the start  of the procedure. The procedure was tolerated well with a pain level of 0 throughout and a pain level of 0 following the procedure. Post procedure Diagnosis Wound #1: Same as Pre-Procedure . Plan Wound Cleansing: Wound #1 Right Calcaneus: Clean wound with Normal Saline. Anesthetic: Wound #1 Right Calcaneus: Topical Lidocaine 4% cream applied to wound bed prior to debridement Skin Barriers/Peri-Wound Care: Wound #1 Right Calcaneus: Skin Prep Primary Wound Dressing: Wound #1 Right Calcaneus: ABD Pad Secondary Dressing: Jimmerson, Hessie L. ([id number]) Wound #1 Right Calcaneus: Conform/Kerlix - Coban Dressing Change Frequency: Wound #1 Right Calcaneus: Other: - leave dressing in place Follow-up Appointments: Wound #1 Right Calcaneus: Return Appointment in 1 week. Home Health: Wound #1 Right Calcaneus: Continue Home Health Visits - Graphix applied. Hold wound care this week. Home Health Nurse may visit PRN to address patient s wound care needs. FACE TO FACE ENCOUNTER: MEDICARE and MEDICAID PATIENTS: I certify that this patient is under my care and that I had a face-to-face encounter that meets the physician face-to-face encounter requirements with this patient on this date. The  encounter with the patient was in whole or in part for the following MEDICAL CONDITION: (primary reason for Home Healthcare) MEDICAL NECESSITY: I certify, that based on my findings, NURSING services are a medically necessary home health service. HOME BOUND STATUS: I certify that my clinical findings support that this patient is homebound (i.e., Due to illness or injury, pt requires aid of supportive devices such as crutches, cane, wheelchairs, walkers, the use of special transportation or the assistance of another person to leave their place of residence. There is a normal inability to leave the home and doing so requires considerable and taxing effort. Other absences are for medical reasons / religious  services and are infrequent or of short duration when for other reasons). If current dressing causes regression in wound condition, may D/C ordered dressing product/s and apply Normal Saline Moist Dressing daily until next Wound Healing Center / Other MD appointment. Notify Wound Healing Center of regression in wound condition at [phone]. Please direct any NON-WOUND related issues/requests for orders to patient's Primary Care Physician Advanced Therapies: Wound #1 Right Calcaneus: Grafix Prime application in clinic; including contact layer, fixation with steri strips, dry gauze and cover dressing. General Notes: order grafix...smallest size After appropriate debridement and preparation of the wound bed she has had the first application of Grafix today. The heel as been bolstered in place and will be reviewed next week for the next application of Grafix. Electronic Signature(s) Signed: 12/14/2015 4:30:04 PM By: Evlyn Kanner MD, FACS Entered By: Evlyn Kanner on 12/14/2015 16:30:04 [name] ([id number]) -------------------------------------------------------------------------------- SuperBill Details Patient Name: [name] Date of Service: 12/14/2015 Medical Record Number: [id number] Patient Account Number: [id number] Date of Birth/Sex: January 12, 1925 (82 y.o. Female) Treating RN: Huel Coventry Primary Care Physician: Rolm Gala Other Clinician: Referring Physician: Rolm Gala Treating Physician/Extender: Rudene Re in Treatment: 13 Diagnosis Coding ICD-10 Codes Code Description [phone] Pressure ulcer of right heel, stage 3 E44.1 Mild protein-calorie malnutrition Facility Procedures CPT4 Code: [id number] Description: (Facility Use Only) Grafix Prime 2x3 Sq SQ CM Modifier: Quantity: 2 CPT4 Code: [id number] Description: 15275 - SKIN SUB GRAFT FACE/NK/HF/G ICD-10 Description Diagnosis L89.613 Pressure ulcer of right heel, stage 3 E44.1 Mild protein-calorie  malnutrition Modifier: Quantity: 1 Physician Procedures CPT4 Code: [id number] Description: 15275 - WC PHYS SKIN SUB GRAFT FACE/NK/HF/G ICD-10 Description Diagnosis L89.613 Pressure ulcer of right heel, stage 3 E44.1 Mild protein-calorie malnutrition Modifier: Quantity: 1 Electronic Signature(s) Signed: 12/14/2015 4:30:17 PM By: Evlyn Kanner MD, FACS Entered By: Evlyn Kanner on 12/14/2015 16:30:17

## 2015-12-15 NOTE — Progress Notes (Signed)
[name], Sobia L. ([id number]) Visit Report for 12/14/2015 Arrival Information Details Patient Name: [name], [name] Date of Service: 12/14/2015 2:30 PM Medical Record Number: [id number] Patient Account Number: [id number] Date of Birth/Sex: 07/05/1924 (82 y.o. Female) Treating RN: Huel CoventryWoody, Kim Primary Care Physician: Rolm GalaGrandis, Heidi Other Clinician: Referring Physician: Rolm GalaGrandis, Heidi Treating Physician/Extender: Rudene ReBritto, Errol Weeks in Treatment: 13 Visit Information History Since Last Visit Added or deleted any medications: No Patient Arrived: Ambulatory Any new allergies or adverse reactions: No Arrival Time: 14:39 Had a fall or experienced change in No Accompanied By: caregiver activities of daily living that may affect Transfer Assistance: Manual risk of falls: Patient Identification Verified: Yes Signs or symptoms of abuse/neglect since last No Patient Requires Transmission- No visito Based Precautions: Hospitalized since last visit: No Patient Has Alerts: Yes Has Dressing in Place as Prescribed: Yes Patient Alerts: Patient on Blood Pain Present Now: No Thinner eliquis Electronic Signature(s) Signed: 12/14/2015 5:39:47 PM By: Elliot GurneyWoody, RN, BSN, Kim RN, BSN Entered By: Elliot GurneyWoody, RN, BSN, Kim on 12/14/2015 14:40:32 [name], [name] ([id number]) -------------------------------------------------------------------------------- Encounter Discharge Information Details Patient Name: [name], [name] Date of Service: 12/14/2015 2:30 PM Medical Record Number: [id number] Patient Account Number: [id number] Date of Birth/Sex: 10/13/1924 (82 y.o. Female) Treating RN: Huel CoventryWoody, Kim Primary Care Physician: Rolm GalaGrandis, Heidi Other Clinician: Referring Physician: Rolm GalaGrandis, Heidi Treating Physician/Extender: Rudene ReBritto, Errol Weeks in Treatment: 913 Encounter Discharge Information Items Discharge Pain Level: 0 Discharge Condition: Stable Ambulatory Status: Ambulatory Discharge Destination:  Home Transportation: Private Auto Accompanied By: self Schedule Follow-up Appointment: Yes Medication Reconciliation completed and provided to Patient/Care Yes [name]: Provided on Clinical Summary of Care: 12/14/2015 Form Type Recipient Paper Patient DI Electronic Signature(s) Signed: 12/14/2015 5:39:47 PM By: Elliot GurneyWoody, RN, BSN, Kim RN, BSN Previous Signature: 12/14/2015 3:18:57 PM Version By: Gwenlyn PerkingMoore, Shelia Entered By: Elliot GurneyWoody, RN, BSN, Kim on 12/14/2015 15:20:19 [name], [name] ([id number]) -------------------------------------------------------------------------------- Lower Extremity Assessment Details Patient Name: [name], [name] Date of Service: 12/14/2015 2:30 PM Medical Record Number: [id number] Patient Account Number: [id number] Date of Birth/Sex: 11/18/1924 (82 y.o. Female) Treating RN: Huel CoventryWoody, Kim Primary Care Physician: Rolm GalaGrandis, Heidi Other Clinician: Referring Physician: Rolm GalaGrandis, Heidi Treating Physician/Extender: Rudene ReBritto, Errol Weeks in Treatment: 13 Vascular Assessment Pulses: Posterior Tibial Dorsalis Pedis Palpable: [Right:Yes] Extremity colors, hair growth, and conditions: Extremity Color: [Right:Normal] Hair Growth on Extremity: [Right:Yes] Temperature of Extremity: [Right:Warm] Capillary Refill: [Right:< 3 seconds] Dependent Rubor: [Right:No] Blanched when Elevated: [Right:No] Lipodermatosclerosis: [Right:No] Toe Nail Assessment Left: Right: Thick: No Discolored: No Deformed: No Improper Length and Hygiene: No Electronic Signature(s) Signed: 12/14/2015 5:39:47 PM By: Elliot GurneyWoody, RN, BSN, Kim RN, BSN Entered By: Elliot GurneyWoody, RN, BSN, Kim on 12/14/2015 14:46:51 [name], [name] ([id number]) -------------------------------------------------------------------------------- Multi Wound Chart Details Patient Name: [name], [name] Date of Service: 12/14/2015 2:30 PM Medical Record Number: [id number] Patient Account Number: [id number] Date of Birth/Sex: 01/25/1925 (82  y.o. Female) Treating RN: Huel CoventryWoody, Kim Primary Care Physician: Rolm GalaGrandis, Heidi Other Clinician: Referring Physician: Rolm GalaGrandis, Heidi Treating Physician/Extender: Rudene ReBritto, Errol Weeks in Treatment: 13 Vital Signs Height(in): 59 Pulse(bpm): 82 Weight(lbs): 108 Blood Pressure 130/66 (mmHg): Body Mass Index(BMI): 22 Temperature(F): 97.8 Respiratory Rate 16 (breaths/min): Photos: [N/A:N/A] Wound Location: Right Calcaneus N/A N/A Wounding Event: Pressure Injury N/A N/A Primary Etiology: Pressure Ulcer N/A N/A Comorbid History: Deep Vein Thrombosis, N/A N/A Hypertension, Dementia Date Acquired: 07/14/2015 N/A N/A Weeks of Treatment: 13 N/A N/A Wound Status: Open N/A N/A Measurements L x W x D 0.4x0.3x0.3 N/A N/A (cm) Area (cm) : 0.094 N/A N/A Volume (cm) : 0.028 N/A  N/A % Reduction in Area: 98.30% N/A N/A % Reduction in Volume: 97.50% N/A N/A Classification: Category/Stage III N/A N/A Exudate Amount: Large N/A N/A Exudate Type: Serous N/A N/A Exudate Color: amber N/A N/A Wound Margin: Flat and Intact N/A N/A Granulation Amount: Medium (34-66%) N/A N/A Granulation Quality: Red N/A N/A Necrotic Amount: Small (1-33%) N/A N/A Exposed Structures: Fat: Yes N/A N/A Fascia: No [name], [name] ([id number]) Tendon: No Muscle: No Joint: No Bone: No Epithelialization: Small (1-33%) N/A N/A Periwound Skin Texture: Scarring: Yes N/A N/A Edema: No Excoriation: No Induration: No Callus: No Crepitus: No Fluctuance: No Friable: No Rash: No Periwound Skin Maceration: Yes N/A N/A Moisture: Dry/Scaly: Yes Moist: No Periwound Skin Color: Erythema: Yes N/A N/A Atrophie Blanche: No Cyanosis: No Ecchymosis: No Hemosiderin Staining: No Mottled: No Pallor: No Rubor: No Erythema Location: Circumferential N/A N/A Temperature: No Abnormality N/A N/A Tenderness on Yes N/A N/A Palpation: Wound Preparation: Ulcer Cleansing: N/A N/A Rinsed/Irrigated with Saline Topical  Anesthetic Applied: Other: lidocaine 4% Treatment Notes Electronic Signature(s) Signed: 12/14/2015 5:39:47 PM By: Elliot GurneyWoody, RN, BSN, Kim RN, BSN Entered By: Elliot GurneyWoody, RN, BSN, Kim on 12/14/2015 15:01:16 [name], Laquonda L. ([id number]) -------------------------------------------------------------------------------- Multi-Disciplinary Care Plan Details Patient Name: [name], [name] Date of Service: 12/14/2015 2:30 PM Medical Record Number: [id number] Patient Account Number: [id number] Date of Birth/Sex: 07/08/1924 (82 y.o. Female) Treating RN: Huel CoventryWoody, Kim Primary Care Physician: Rolm GalaGrandis, Heidi Other Clinician: Referring Physician: Rolm GalaGrandis, Heidi Treating Physician/Extender: Rudene ReBritto, Errol Weeks in Treatment: 6813 Active Inactive Abuse / Safety / Falls / Self Care Management Nursing Diagnoses: Impaired home maintenance Impaired physical mobility Potential for falls Self care deficit: actual or potential Goals: Patient will remain injury free Date Initiated: 09/14/2015 Goal Status: Active Patient/caregiver will verbalize understanding of skin care regimen Date Initiated: 09/14/2015 Goal Status: Active Patient/caregiver will verbalize/demonstrate measure taken to improve self care Date Initiated: 09/14/2015 Goal Status: Active Patient/caregiver will verbalize/demonstrate measures taken to improve the patient's personal safety Date Initiated: 09/14/2015 Goal Status: Active Patient/caregiver will verbalize/demonstrate measures taken to prevent injury and/or falls Date Initiated: 09/14/2015 Goal Status: Active Patient/caregiver will verbalize/demonstrate understanding of what to do in case of emergency Date Initiated: 09/14/2015 Goal Status: Active Interventions: Assess fall risk on admission and as needed Assess: immobility, friction, shearing, incontinence upon admission and as needed Assess impairment of mobility on admission and as needed per policy Assess self care needs on admission and as  needed Provide education on basic hygiene Provide education on personal and home safety [name], [name] ([id number]) Provide education on safe transfers Notes: Orientation to the Wound Care Program Nursing Diagnoses: Knowledge deficit related to the wound healing center program Goals: Patient/caregiver will verbalize understanding of the Wound Healing Center Program Date Initiated: 09/14/2015 Goal Status: Active Interventions: Provide education on orientation to the wound center Notes: Pressure Nursing Diagnoses: Knowledge deficit related to causes and risk factors for pressure ulcer development Knowledge deficit related to management of pressures ulcers Potential for impaired tissue integrity related to pressure, friction, moisture, and shear Goals: Patient will remain free from development of additional pressure ulcers Date Initiated: 09/14/2015 Goal Status: Active Patient will remain free of pressure ulcers Date Initiated: 09/14/2015 Goal Status: Active Patient/caregiver will verbalize risk factors for pressure ulcer development Date Initiated: 09/14/2015 Goal Status: Active Patient/caregiver will verbalize understanding of pressure ulcer management Date Initiated: 09/14/2015 Goal Status: Active Interventions: Assess: immobility, friction, shearing, incontinence upon admission and as needed Assess offloading mechanisms upon admission and as needed Assess potential for pressure ulcer upon  admission and as needed Provide education on pressure ulcers Treatment Activities: JASHLEY, YELLIN ([id number]) Patient referred for pressure reduction/relief devices : 09/14/2015 Patient referred for seating evaluation to ensure proper offloading : 09/14/2015 Pressure reduction/relief device ordered : 09/14/2015 Notes: Wound/Skin Impairment Nursing Diagnoses: Impaired tissue integrity Knowledge deficit related to ulceration/compromised skin integrity Goals: Patient/caregiver will  verbalize understanding of skin care regimen Date Initiated: 09/14/2015 Goal Status: Active Ulcer/skin breakdown will have a volume reduction of 30% by week 4 Date Initiated: 09/14/2015 Goal Status: Active Ulcer/skin breakdown will have a volume reduction of 50% by week 8 Date Initiated: 09/14/2015 Goal Status: Active Ulcer/skin breakdown will have a volume reduction of 80% by week 12 Date Initiated: 09/14/2015 Goal Status: Active Ulcer/skin breakdown will heal within 14 weeks Date Initiated: 09/14/2015 Goal Status: Active Interventions: Assess patient/caregiver ability to obtain necessary supplies Assess patient/caregiver ability to perform ulcer/skin care regimen upon admission and as needed Assess ulceration(s) every visit Provide education on ulcer and skin care Treatment Activities: Referred to DME Jameir Ake for dressing supplies : 09/14/2015 Skin care regimen initiated : 09/14/2015 Topical wound management initiated : 09/14/2015 Notes: Electronic Signature(s) Signed: 12/14/2015 5:39:47 PM By: Elliot Gurney, RN, BSN, Kim RN, BSN Entered By: Elliot Gurney, RN, BSN, Kim on 12/14/2015 15:01:08 [name] ([id number] Mallick, Theora Gianotti ([id number]) -------------------------------------------------------------------------------- Pain Assessment Details Patient Name: [name] Date of Service: 12/14/2015 2:30 PM Medical Record Number: [id number] Patient Account Number: [id number] Date of Birth/Sex: [birth year] (82 y.o. Female) Treating RN: Huel Coventry Primary Care Physician: Rolm Gala Other Clinician: Referring Physician: Rolm Gala Treating Physician/Extender: Rudene Re in Treatment: 13 Active Problems Location of Pain Severity and Description of Pain Patient Has Paino No Site Locations With Dressing Change: No Pain Management and Medication Current Pain Management: Electronic Signature(s) Signed: 12/14/2015 5:39:47 PM By: Elliot Gurney, RN, BSN, Kim RN, BSN Entered By:  Elliot Gurney, RN, BSN, Kim on 12/14/2015 14:40:44 [name] ([id number]) -------------------------------------------------------------------------------- Patient/Caregiver Education Details Patient Name: [name] Date of Service: 12/14/2015 2:30 PM Medical Record Number: [id number] Patient Account Number: [id number] Date of Birth/Gender: 1924-11-12 (82 y.o. Female) Treating RN: Huel Coventry Primary Care Physician: Rolm Gala Other Clinician: Referring Physician: Rolm Gala Treating Physician/Extender: Rudene Re in Treatment: 13 Education Assessment Education Provided To: Patient Education Topics Provided Wound/Skin Impairment: Handouts: Caring for Your Ulcer, Other: keep dressing in place. do not remove. Methods: Explain/Verbal Responses: State content correctly Electronic Signature(s) Signed: 12/14/2015 5:39:47 PM By: Elliot Gurney, RN, BSN, Kim RN, BSN Entered By: Elliot Gurney, RN, BSN, Kim on 12/14/2015 15:20:53 [name] ([id number]) -------------------------------------------------------------------------------- Wound Assessment Details Patient Name: [name] Date of Service: 12/14/2015 2:30 PM Medical Record Number: [id number] Patient Account Number: [id number] Date of Birth/Sex: Jun 29, 1924 (82 y.o. Female) Treating RN: Huel Coventry Primary Care Physician: Rolm Gala Other Clinician: Referring Physician: Rolm Gala Treating Physician/Extender: Rudene Re in Treatment: 13 Wound Status Wound Number: 1 Primary Pressure Ulcer Etiology: Wound Location: Right Calcaneus Wound Status: Open Wounding Event: Pressure Injury Comorbid Deep Vein Thrombosis, Date Acquired: 07/14/2015 History: Hypertension, Dementia Weeks Of Treatment: 13 Clustered Wound: No Photos Wound Measurements Length: (cm) 0.4 Width: (cm) 0.3 Depth: (cm) 0.3 Area: (cm) 0.094 Volume: (cm) 0.028 % Reduction in Area: 98.3% % Reduction in Volume:  97.5% Epithelialization: Small (1-33%) Tunneling: No Undermining: No Wound Description Classification: Category/Stage III Foul Odor Aft Wound Margin: Flat and Intact Exudate Amount: Large Exudate Type: Serous Exudate Color: amber er Cleansing: No Wound Bed Granulation Amount: Medium (34-66%) Exposed Structure Granulation  Quality: Red Fascia Exposed: No Necrotic Amount: Small (1-33%) Fat Layer Exposed: Yes Necrotic Quality: Adherent Slough Tendon Exposed: No Muscle Exposed: No Joint Exposed: No Bone Exposed: No Hodgens, Aubriee L. ([id number]) Periwound Skin Texture Texture Color No Abnormalities Noted: No No Abnormalities Noted: No Callus: No Atrophie Blanche: No Crepitus: No Cyanosis: No Excoriation: No Ecchymosis: No Fluctuance: No Erythema: Yes Friable: No Erythema Location: Circumferential Induration: No Hemosiderin Staining: No Localized Edema: No Mottled: No Rash: No Pallor: No Scarring: Yes Rubor: No Moisture Temperature / Pain No Abnormalities Noted: No Temperature: No Abnormality Dry / Scaly: Yes Tenderness on Palpation: Yes Maceration: Yes Moist: No Wound Preparation Ulcer Cleansing: Rinsed/Irrigated with Saline Topical Anesthetic Applied: Other: lidocaine 4%, Treatment Notes Wound #1 (Right Calcaneus) 1. Cleansed with: Clean wound with Normal Saline 2. Anesthetic Topical Lidocaine 4% cream to wound bed prior to debridement 5. Secondary Dressing Applied ABD Pad Kerlix/Conform Notes Grafix applied. coban to secure Electronic Signature(s) Signed: 12/14/2015 5:39:47 PM By: Elliot Gurney, RN, BSN, Kim RN, BSN Entered By: Elliot Gurney, RN, BSN, Kim on 12/14/2015 14:47:48 [name] ([id number]) -------------------------------------------------------------------------------- Vitals Details Patient Name: [name] Date of Service: 12/14/2015 2:30 PM Medical Record Number: [id number] Patient Account Number: [id number] Date of Birth/Sex: Jun 25, 1924  (82 y.o. Female) Treating RN: Huel Coventry Primary Care Physician: Rolm Gala Other Clinician: Referring Physician: Rolm Gala Treating Physician/Extender: Rudene Re in Treatment: 13 Vital Signs Time Taken: 14:20 Temperature (F): 97.8 Height (in): 59 Pulse (bpm): 82 Weight (lbs): 108 Respiratory Rate (breaths/min): 16 Body Mass Index (BMI): 21.8 Blood Pressure (mmHg): 130/66 Reference Range: 80 - 120 mg / dl Electronic Signature(s) Signed: 12/14/2015 5:39:47 PM By: Elliot Gurney, RN, BSN, Kim RN, BSN Entered By: Elliot Gurney, RN, BSN, Kim on 12/14/2015 14:41:03

## 2015-12-19 DIAGNOSIS — G309 Alzheimer's disease, unspecified: Secondary | ICD-10-CM | POA: Insufficient documentation

## 2015-12-21 ENCOUNTER — Encounter: Payer: Medicare Other | Admitting: Surgery

## 2015-12-21 DIAGNOSIS — L89613 Pressure ulcer of right heel, stage 3: Secondary | ICD-10-CM | POA: Diagnosis not present

## 2015-12-22 NOTE — Progress Notes (Signed)
ISSIS, LINDSETH ([id number]) Visit Report for 12/21/2015 Arrival Information Details Patient Name: ALLEAN, MONTFORT Date of Service: 12/21/2015 2:15 PM Medical Record Number: [id number] Patient Account Number: [id number] Date of Birth/Sex: [birth year] (82 y.o. Female) Treating RN: Curtis Sites Primary Care Physician: Rolm Gala Other Clinician: Referring Physician: Rolm Gala Treating Physician/Extender: Rudene Re in Treatment: 14 Visit Information History Since Last Visit Added or deleted any medications: No Patient Arrived: Wheel Chair Any new allergies or adverse reactions: No Arrival Time: 14:20 Had a fall or experienced change in No Accompanied By: cg activities of daily living that may affect Transfer Assistance: Manual risk of falls: Patient Identification Verified: Yes Signs or symptoms of abuse/neglect since last No Secondary Verification Process Yes visito Completed: Hospitalized since last visit: No Patient Requires Transmission- No Pain Present Now: No Based Precautions: Patient Has Alerts: Yes Patient Alerts: Patient on Blood Thinner eliquis Electronic Signature(s) Signed: 12/21/2015 5:05:14 PM By: Curtis Sites Entered By: Curtis Sites on 12/21/2015 14:28:24 [name] ([id number]) -------------------------------------------------------------------------------- Encounter Discharge Information Details Patient Name: [name] Date of Service: 12/21/2015 2:15 PM Medical Record Number: [id number] Patient Account Number: [id number] Date of Birth/Sex: [birth year] (82 y.o. Female) Treating RN: Curtis Sites Primary Care Physician: Rolm Gala Other Clinician: Referring Physician: Rolm Gala Treating Physician/Extender: Rudene Re in Treatment: 85 Encounter Discharge Information Items Discharge Pain Level: 0 Discharge Condition: Stable Ambulatory Status: Wheelchair Discharge Destination:  Home Transportation: Private Auto Accompanied By: friend Schedule Follow-up Appointment: Yes Medication Reconciliation completed and provided to Patient/Care No Latisa Belay: Provided on Clinical Summary of Care: 12/21/2015 Form Type Recipient Paper Patient DI Electronic Signature(s) Signed: 12/21/2015 4:53:31 PM By: Curtis Sites Previous Signature: 12/21/2015 3:10:05 PM Version By: Gwenlyn Perking Entered By: Curtis Sites on 12/21/2015 16:53:31 Dehaven, Laurencia Elbert Ewings ([id number]) -------------------------------------------------------------------------------- Multi Wound Chart Details Patient Name: [name] Date of Service: 12/21/2015 2:15 PM Medical Record Number: [id number] Patient Account Number: [id number] Date of Birth/Sex: Jul 31, 1924 (82 y.o. Female) Treating RN: Curtis Sites Primary Care Physician: Rolm Gala Other Clinician: Referring Physician: Rolm Gala Treating Physician/Extender: Rudene Re in Treatment: 14 Vital Signs Height(in): 59 Pulse(bpm): 93 Weight(lbs): 108 Blood Pressure 124/73 (mmHg): Body Mass Index(BMI): 22 Temperature(F): 98.1 Respiratory Rate 16 (breaths/min): Photos: [N/A:N/A] Wound Location: Right Calcaneus N/A N/A Wounding Event: Pressure Injury N/A N/A Primary Etiology: Pressure Ulcer N/A N/A Comorbid History: Deep Vein Thrombosis, N/A N/A Hypertension, Dementia Date Acquired: 07/14/2015 N/A N/A Weeks of Treatment: 14 N/A N/A Wound Status: Open N/A N/A Measurements L x W x D 0.4x0.3x0.2 N/A N/A (cm) Area (cm) : 0.094 N/A N/A Volume (cm) : 0.019 N/A N/A % Reduction in Area: 98.30% N/A N/A % Reduction in Volume: 98.30% N/A N/A Classification: Category/Stage III N/A N/A Exudate Amount: Large N/A N/A Exudate Type: Serous N/A N/A Exudate Color: amber N/A N/A Wound Margin: Flat and Intact N/A N/A Granulation Amount: Large (67-100%) N/A N/A Granulation Quality: Red N/A N/A Necrotic Amount: Small (1-33%) N/A  N/A Metts, Mariposa L. ([id number]) Exposed Structures: Fat: Yes N/A N/A Fascia: No Tendon: No Muscle: No Joint: No Bone: No Epithelialization: Small (1-33%) N/A N/A Periwound Skin Texture: Scarring: Yes N/A N/A Edema: No Excoriation: No Induration: No Callus: No Crepitus: No Fluctuance: No Friable: No Rash: No Periwound Skin Maceration: Yes N/A N/A Moisture: Dry/Scaly: Yes Moist: No Periwound Skin Color: Erythema: Yes N/A N/A Atrophie Blanche: No Cyanosis: No Ecchymosis: No Hemosiderin Staining: No Mottled: No Pallor: No Rubor: No Erythema Location: Circumferential N/A N/A Temperature: No Abnormality N/A  N/A Tenderness on Yes N/A N/A Palpation: Wound Preparation: Ulcer Cleansing: N/A N/A Rinsed/Irrigated with Saline Topical Anesthetic Applied: Other: lidocaine 4% Treatment Notes Electronic Signature(s) Signed: 12/21/2015 5:05:14 PM By: Curtis Sitesorthy, Joanna Entered By: Curtis Sitesorthy, Joanna on 12/21/2015 14:54:38 [name], Paelyn L. ([id number]) -------------------------------------------------------------------------------- Multi-Disciplinary Care Plan Details Patient Name: [name], Jobeth L. Date of Service: 12/21/2015 2:15 PM Medical Record Number: [id number] Patient Account Number: [id number] Date of Birth/Sex: 01/19/1925 (82 y.o. Female) Treating RN: Curtis Sitesorthy, Joanna Primary Care Physician: Rolm GalaGrandis, Heidi Other Clinician: Referring Physician: Rolm GalaGrandis, Heidi Treating Physician/Extender: Rudene ReBritto, Errol Weeks in Treatment: 1814 Active Inactive Abuse / Safety / Falls / Self Care Management Nursing Diagnoses: Impaired home maintenance Impaired physical mobility Potential for falls Self care deficit: actual or potential Goals: Patient will remain injury free Date Initiated: 09/14/2015 Goal Status: Active Patient/caregiver will verbalize understanding of skin care regimen Date Initiated: 09/14/2015 Goal Status: Active Patient/caregiver will verbalize/demonstrate measure  taken to improve self care Date Initiated: 09/14/2015 Goal Status: Active Patient/caregiver will verbalize/demonstrate measures taken to improve the patient's personal safety Date Initiated: 09/14/2015 Goal Status: Active Patient/caregiver will verbalize/demonstrate measures taken to prevent injury and/or falls Date Initiated: 09/14/2015 Goal Status: Active Patient/caregiver will verbalize/demonstrate understanding of what to do in case of emergency Date Initiated: 09/14/2015 Goal Status: Active Interventions: Assess fall risk on admission and as needed Assess: immobility, friction, shearing, incontinence upon admission and as needed Assess impairment of mobility on admission and as needed per policy Assess self care needs on admission and as needed Provide education on basic hygiene Provide education on personal and home safety Harvel QualeSLEY, Dalores L. ([id number]) Provide education on safe transfers Notes: Orientation to the Wound Care Program Nursing Diagnoses: Knowledge deficit related to the wound healing center program Goals: Patient/caregiver will verbalize understanding of the Wound Healing Center Program Date Initiated: 09/14/2015 Goal Status: Active Interventions: Provide education on orientation to the wound center Notes: Pressure Nursing Diagnoses: Knowledge deficit related to causes and risk factors for pressure ulcer development Knowledge deficit related to management of pressures ulcers Potential for impaired tissue integrity related to pressure, friction, moisture, and shear Goals: Patient will remain free from development of additional pressure ulcers Date Initiated: 09/14/2015 Goal Status: Active Patient will remain free of pressure ulcers Date Initiated: 09/14/2015 Goal Status: Active Patient/caregiver will verbalize risk factors for pressure ulcer development Date Initiated: 09/14/2015 Goal Status: Active Patient/caregiver will verbalize understanding of pressure  ulcer management Date Initiated: 09/14/2015 Goal Status: Active Interventions: Assess: immobility, friction, shearing, incontinence upon admission and as needed Assess offloading mechanisms upon admission and as needed Assess potential for pressure ulcer upon admission and as needed Provide education on pressure ulcers Treatment Activities: Harvel QualeSLEY, Reanna L. ([id number]) Patient referred for pressure reduction/relief devices : 09/14/2015 Patient referred for seating evaluation to ensure proper offloading : 09/14/2015 Pressure reduction/relief device ordered : 09/14/2015 Notes: Wound/Skin Impairment Nursing Diagnoses: Impaired tissue integrity Knowledge deficit related to ulceration/compromised skin integrity Goals: Patient/caregiver will verbalize understanding of skin care regimen Date Initiated: 09/14/2015 Goal Status: Active Ulcer/skin breakdown will have a volume reduction of 30% by week 4 Date Initiated: 09/14/2015 Goal Status: Active Ulcer/skin breakdown will have a volume reduction of 50% by week 8 Date Initiated: 09/14/2015 Goal Status: Active Ulcer/skin breakdown will have a volume reduction of 80% by week 12 Date Initiated: 09/14/2015 Goal Status: Active Ulcer/skin breakdown will heal within 14 weeks Date Initiated: 09/14/2015 Goal Status: Active Interventions: Assess patient/caregiver ability to obtain necessary supplies Assess patient/caregiver ability to perform ulcer/skin care regimen upon admission and  as needed Assess ulceration(s) every visit Provide education on ulcer and skin care Treatment Activities: Referred to DME Kadir Azucena for dressing supplies : 09/14/2015 Skin care regimen initiated : 09/14/2015 Topical wound management initiated : 09/14/2015 Notes: Electronic Signature(s) Signed: 12/21/2015 5:05:14 PM By: Curtis Sitesorthy, Joanna Entered By: Curtis Sitesorthy, Joanna on 12/21/2015 14:54:22 Jann, Abbygale Elbert EwingsL. ([id number]) Ernest MallickISLEY, [name]  ([id number]) -------------------------------------------------------------------------------- Pain Assessment Details Patient Name: [name], Danial L. Date of Service: 12/21/2015 2:15 PM Medical Record Number: [id number] Patient Account Number: [id number] Date of Birth/Sex: 12/11/1924 (82 y.o. Female) Treating RN: Curtis Sitesorthy, Joanna Primary Care Physician: Rolm GalaGrandis, Heidi Other Clinician: Referring Physician: Rolm GalaGrandis, Heidi Treating Physician/Extender: Rudene ReBritto, Errol Weeks in Treatment: 14 Active Problems Location of Pain Severity and Description of Pain Patient Has Paino No Site Locations Pain Management and Medication Current Pain Management: Notes Topical or injectable lidocaine is offered to patient for acute pain when surgical debridement is performed. If needed, Patient is instructed to use over the counter pain medication for the following 24-48 hours after debridement. Wound care MDs do not prescribed pain medications. Patient has chronic pain or uncontrolled pain. Patient has been instructed to make an appointment with their Primary Care Physician for pain management. Electronic Signature(s) Signed: 12/21/2015 5:05:14 PM By: Curtis Sitesorthy, Joanna Entered By: Curtis Sitesorthy, Joanna on 12/21/2015 14:28:32 [name], Makalynn L. ([id number]) -------------------------------------------------------------------------------- Patient/Caregiver Education Details Patient Name: [name], Ignacia L. Date of Service: 12/21/2015 2:15 PM Medical Record Number: [id number] Patient Account Number: [id number] Date of Birth/Gender: 09/23/1924 (82 y.o. Female) Treating RN: Curtis Sitesorthy, Joanna Primary Care Physician: Rolm GalaGrandis, Heidi Other Clinician: Referring Physician: Rolm GalaGrandis, Heidi Treating Physician/Extender: Rudene ReBritto, Errol Weeks in Treatment: 14 Education Assessment Education Provided To: Patient and Caregiver Education Topics Provided Wound/Skin Impairment: Handouts: Other: leave dressing intact until visit on  Monday Methods: Explain/Verbal Responses: State content correctly Electronic Signature(s) Signed: 12/21/2015 5:05:14 PM By: Curtis Sitesorthy, Joanna Entered By: Curtis Sitesorthy, Joanna on 12/21/2015 16:53:58 Gugliotta, Jennifer Elbert EwingsL. ([id number]) -------------------------------------------------------------------------------- Wound Assessment Details Patient Name: [name], Kamani L. Date of Service: 12/21/2015 2:15 PM Medical Record Number: [id number] Patient Account Number: [id number] Date of Birth/Sex: 10/06/1924 (82 y.o. Female) Treating RN: Curtis Sitesorthy, Joanna Primary Care Physician: Rolm GalaGrandis, Heidi Other Clinician: Referring Physician: Rolm GalaGrandis, Heidi Treating Physician/Extender: Rudene ReBritto, Errol Weeks in Treatment: 14 Wound Status Wound Number: 1 Primary Pressure Ulcer Etiology: Wound Location: Right Calcaneus Wound Status: Open Wounding Event: Pressure Injury Comorbid Deep Vein Thrombosis, Date Acquired: 07/14/2015 History: Hypertension, Dementia Weeks Of Treatment: 14 Clustered Wound: No Photos Wound Measurements Length: (cm) 0.4 Width: (cm) 0.3 Depth: (cm) 0.2 Area: (cm) 0.094 Volume: (cm) 0.019 % Reduction in Area: 98.3% % Reduction in Volume: 98.3% Epithelialization: Small (1-33%) Tunneling: No Undermining: No Wound Description Classification: Category/Stage III Foul Odor Aft Wound Margin: Flat and Intact Exudate Amount: Large Exudate Type: Serous Exudate Color: amber er Cleansing: No Wound Bed Granulation Amount: Large (67-100%) Exposed Structure Granulation Quality: Red Fascia Exposed: No Necrotic Amount: Small (1-33%) Fat Layer Exposed: Yes Necrotic Quality: Adherent Slough Tendon Exposed: No Muscle Exposed: No Gedeon, Wilmetta L. ([id number]) Joint Exposed: No Bone Exposed: No Periwound Skin Texture Texture Color No Abnormalities Noted: No No Abnormalities Noted: No Callus: No Atrophie Blanche: No Crepitus: No Cyanosis: No Excoriation: No Ecchymosis: No Fluctuance:  No Erythema: Yes Friable: No Erythema Location: Circumferential Induration: No Hemosiderin Staining: No Localized Edema: No Mottled: No Rash: No Pallor: No Scarring: Yes Rubor: No Moisture Temperature / Pain No Abnormalities Noted: No Temperature: No Abnormality Dry / Scaly: Yes Tenderness on Palpation: Yes Maceration: Yes Moist: No Wound Preparation Ulcer Cleansing:  Rinsed/Irrigated with Saline Topical Anesthetic Applied: Other: lidocaine 4%, Treatment Notes Wound #1 (Right Calcaneus) 1. Cleansed with: Clean wound with Normal Saline 2. Anesthetic Topical Lidocaine 4% cream to wound bed prior to debridement 4. Dressing Applied: Other dressing (specify in notes) 5. Secondary Dressing Applied Guaze, ABD and kerlix/Conform 7. Secured with Tape Notes polymem with silver, coban to secure Electronic Signature(s) Signed: 12/21/2015 5:05:14 PM By: Curtis Sites Entered By: Curtis Sites on 12/21/2015 14:53:53 [name] ([id number]) -------------------------------------------------------------------------------- Vitals Details Patient Name: [name] Date of Service: 12/21/2015 2:15 PM Medical Record Number: [id number] Patient Account Number: [id number] Date of Birth/Sex: 1924/09/26 (82 y.o. Female) Treating RN: Curtis Sites Primary Care Physician: Rolm Gala Other Clinician: Referring Physician: Rolm Gala Treating Physician/Extender: Rudene Re in Treatment: 14 Vital Signs Time Taken: 14:28 Temperature (F): 98.1 Height (in): 59 Pulse (bpm): 93 Weight (lbs): 108 Respiratory Rate (breaths/min): 16 Body Mass Index (BMI): 21.8 Blood Pressure (mmHg): 124/73 Reference Range: 80 - 120 mg / dl Electronic Signature(s) Signed: 12/21/2015 5:05:14 PM By: Curtis Sites Entered By: Curtis Sites on 12/21/2015 14:29:01

## 2015-12-22 NOTE — Progress Notes (Signed)
ALEAN, KROMER ([id number]) Visit Report for 12/21/2015 Chief Complaint Document Details Patient Name: [name], [name] Date of Service: 12/21/2015 2:15 PM Medical Record Number: [id number] Patient Account Number: [id number] Date of Birth/Sex: 1924-02-22 (82 y.o. Female) Treating RN: Curtis Sites Primary Care Physician: Rolm Gala Other Clinician: Referring Physician: Rolm Gala Treating Physician/Extender: Rudene Re in Treatment: 14 Information Obtained from: Patient Chief Complaint Patient is at the clinic for treatment of an open pressure ulcer right heel which she's had for about 8 weeks Electronic Signature(s) Signed: 12/21/2015 3:04:43 PM By: Evlyn Kanner MD, FACS Entered By: Evlyn Kanner on 12/21/2015 15:04:43 [name] ([id number]) -------------------------------------------------------------------------------- Debridement Details Patient Name: [name] Date of Service: 12/21/2015 2:15 PM Medical Record Number: [id number] Patient Account Number: [id number] Date of Birth/Sex: 1924/03/16 (82 y.o. Female) Treating RN: Curtis Sites Primary Care Physician: Rolm Gala Other Clinician: Referring Physician: Rolm Gala Treating Physician/Extender: Rudene Re in Treatment: 14 Debridement Performed for Wound #1 Right Calcaneus Assessment: Performed By: Physician Evlyn Kanner, MD Debridement: Debridement Pre-procedure Yes - 14:51 Verification/Time Out Taken: Start Time: 14:51 Pain Control: Lidocaine 4% Topical Solution Level: Skin/Subcutaneous Tissue Total Area Debrided (L x 0.4 (cm) x 0.3 (cm) = 0.12 (cm) W): Tissue and other Viable, Non-Viable, Callus, Fibrin/Slough, Subcutaneous material debrided: Instrument: Curette Bleeding: Minimum Hemostasis Achieved: Pressure End Time: 14:54 Procedural Pain: 0 Post Procedural Pain: 0 Response to Treatment: Procedure was tolerated well Post Debridement Measurements of  Total Wound Length: (cm) 0.4 Stage: Category/Stage III Width: (cm) 0.3 Depth: (cm) 0.3 Volume: (cm) 0.028 Character of Wound/Ulcer Post Improved Debridement: Severity of Tissue Post Fat layer exposed Debridement: Post Procedure Diagnosis Same as Pre-procedure Electronic Signature(s) Signed: 12/21/2015 3:04:35 PM By: Evlyn Kanner MD, FACS Signed: 12/21/2015 5:05:14 PM By: Curtis Sites Entered By: Evlyn Kanner on 12/21/2015 15:04:34 [name] ([name], [name] ([id number]) -------------------------------------------------------------------------------- HPI Details Patient Name: [name] Date of Service: 12/21/2015 2:15 PM Medical Record Number: [id number] Patient Account Number: [id number] Date of Birth/Sex: 1924-03-16 (82 y.o. Female) Treating RN: Curtis Sites Primary Care Physician: Rolm Gala Other Clinician: Referring Physician: Rolm Gala Treating Physician/Extender: Rudene Re in Treatment: 14 History of Present Illness Location: right heel Quality: Patient reports experiencing a dull pain to affected area(s). Severity: Patient states wound are getting worse. Duration: Patient has had the wound for < 8 weeks prior to presenting for treatment Timing: Pain in wound is Intermittent (comes and goes Context: The wound occurred when the patient the hip fracture and was in traction for a few days while in hospital Modifying Factors: Other treatment(s) tried include:local care symptomatic treatment Associated Signs and Symptoms: Patient reports having increase discharge. HPI Description: 82 year old patient has been referred to as by her PCP for a pressure ulcer of the right heel. his had this for about 8 weeks Has been treated with duoderm. Has been there for a while it's getting more painful and larger and she also was seen for a recent urinary tract infection. She was given ciprofloxacin for this and then changed on to  nitrofurantoin. She has a past medical history of rheumatic fever, hyperlipidemia, hypertension, scoliosis and osteopenia. never been a smoker. 10/05/2015 -- x-ray of the right heel -- IMPRESSION:No osseous finding of significance. Abnormal appearance of the soft tissues posterior to the heel which could be due to infection or trauma. I do not see an intact Achilles tendon shadow. 12/07/2015 -- the patient has been approved for grafix and we will get this ready  for her next week. 12/14/2015 -- she is here for her first application of Grafix. 12/21/2015 - the cause of the holiday we will change her application of Grafix to Monday and will order 16mm size for this day Electronic Signature(s) Signed: 12/21/2015 3:05:11 PM By: Evlyn Kanner MD, FACS Entered By: Evlyn Kanner on 12/21/2015 15:05:11 [name] ([id number]) -------------------------------------------------------------------------------- Physical Exam Details Patient Name: [name] Date of Service: 12/21/2015 2:15 PM Medical Record Number: [id number] Patient Account Number: [id number] Date of Birth/Sex: [birth year] (82 y.o. Female) Treating RN: Curtis Sites Primary Care Physician: Rolm Gala Other Clinician: Referring Physician: Rolm Gala Treating Physician/Extender: Rudene Re in Treatment: 14 Constitutional . Pulse regular. Respirations normal and unlabored. Afebrile. . Eyes Nonicteric. Reactive to light. Ears, Nose, Mouth, and Throat Lips, teeth, and gums WNL.Marland Kitchen Moist mucosa without lesions. Neck supple and nontender. No palpable supraclavicular or cervical adenopathy. Normal sized without goiter. Respiratory WNL. No retractions.. Breath sounds WNL, No rubs, rales, rhonchi, or wheeze.. Cardiovascular Heart rhythm and rate regular, no murmur or gallop.. Pedal Pulses WNL. No clubbing, cyanosis or edema. Chest Breasts symmetical and no nipple discharge.. Breast tissue WNL, no masses, lumps,  or tenderness.. Lymphatic No adneopathy. No adenopathy. No adenopathy. Musculoskeletal Adexa without tenderness or enlargement.. Digits and nails w/o clubbing, cyanosis, infection, petechiae, ischemia, or inflammatory conditions.. Integumentary (Hair, Skin) No suspicious lesions. No crepitus or fluctuance. No peri-wound warmth or erythema. No masses.Marland Kitchen Psychiatric Judgement and insight Intact.. No evidence of depression, anxiety, or agitation.. Notes the wound bed is fairly clean but has surrounding eschar and subcutaneous debris and after removing this the wound does come in nicely. Electronic Signature(s) Signed: 12/21/2015 3:05:34 PM By: Evlyn Kanner MD, FACS Entered By: Evlyn Kanner on 12/21/2015 15:05:33 [name] ([id number]) -------------------------------------------------------------------------------- Physician Orders Details Patient Name: [name] Date of Service: 12/21/2015 2:15 PM Medical Record Number: [id number] Patient Account Number: [id number] Date of Birth/Sex: November 25, 1924 (82 y.o. Female) Treating RN: Curtis Sites Primary Care Physician: Rolm Gala Other Clinician: Referring Physician: Rolm Gala Treating Physician/Extender: Rudene Re in Treatment: 83 Verbal / Phone Orders: Yes Clinician: Curtis Sites Read Back and Verified: Yes Diagnosis Coding Wound Cleansing Wound #1 Right Calcaneus o Cleanse wound with mild soap and water o May Shower, gently pat wound dry prior to applying new dressing. o May shower with protection. Anesthetic Wound #1 Right Calcaneus o Topical Lidocaine 4% cream applied to wound bed prior to debridement - In Clinic Skin Barriers/Peri-Wound Care Wound #1 Right Calcaneus o Other: - may apply TCA cream to peri wound skin for itching Primary Wound Dressing Wound #1 Right Calcaneus o Other: - polymem with silver Secondary Dressing Wound #1 Right Calcaneus o Gauze and Kerlix/Conform  - netting Dressing Change Frequency Wound #1 Right Calcaneus o Three times weekly - For Home Health Specifics o Other: - Please leave dressing intact until Mondays appointment Follow-up Appointments Wound #1 Right Calcaneus o Return Appointment in 1 week. Off-Loading Wound #1 Right Calcaneus o Heel suspension boot to: [name], Aphrodite L. ([id number]) Additional Orders / Instructions Wound #1 Right Calcaneus o Increase protein intake. o Activity as tolerated o Other: - Include these vitamins in your diet, MVI, ZINC, Vit C, Vit A Home Health Wound #1 Right Calcaneus o Continue Home Health Visits - [facility] o Home Health Nurse may visit PRN to address patientos wound care needs. o FACE TO FACE ENCOUNTER: MEDICARE and MEDICAID PATIENTS: I certify that this patient is under my care and  that I had a face-to-face encounter that meets the physician face-to-face encounter requirements with this patient on this date. The encounter with the patient was in whole or in part for the following MEDICAL CONDITION: (primary reason for Home Healthcare) MEDICAL NECESSITY: I certify, that based on my findings, NURSING services are a medically necessary home health service. HOME BOUND STATUS: I certify that my clinical findings support that this patient is homebound (i.e., Due to illness or injury, pt requires aid of supportive devices such as crutches, cane, wheelchairs, walkers, the use of special transportation or the assistance of another person to leave their place of residence. There is a normal inability to leave the home and doing so requires considerable and taxing effort. Other absences are for medical reasons / religious services and are infrequent or of short duration when for other reasons). o If current dressing causes regression in wound condition, may D/C ordered dressing product/s and apply Normal Saline Moist Dressing daily until next Wound Healing Center  / Other MD appointment. Notify Wound Healing Center of regression in wound condition at [phone]. o Please direct any NON-WOUND related issues/requests for orders to patient's Primary Care Physician Electronic Signature(s) Signed: 12/21/2015 4:11:06 PM By: Evlyn KannerBritto, Jani Moronta MD, FACS Signed: 12/21/2015 5:05:14 PM By: Curtis Sitesorthy, Joanna Entered By: Curtis Sitesorthy, Joanna on 12/21/2015 15:02:05 [name], [name] ([id number]) -------------------------------------------------------------------------------- Problem List Details Patient Name: [name], [name] Date of Service: 12/21/2015 2:15 PM Medical Record Number: [id number] Patient Account Number: [id number] Date of Birth/Sex: 04/26/1924 (82 y.o. Female) Treating RN: Curtis Sitesorthy, Joanna Primary Care Physician: Rolm GalaGrandis, Heidi Other Clinician: Referring Physician: Rolm GalaGrandis, Heidi Treating Physician/Extender: Rudene ReBritto, Rhiannan Kievit Weeks in Treatment: 14 Active Problems ICD-10 Encounter Code Description Active Date Diagnosis L89.613 Pressure ulcer of right heel, stage 3 09/14/2015 Yes E44.1 Mild protein-calorie malnutrition 09/14/2015 Yes Inactive Problems Resolved Problems Electronic Signature(s) Signed: 12/21/2015 3:04:20 PM By: Evlyn KannerBritto, Sakara Lehtinen MD, FACS Entered By: Evlyn KannerBritto, Calyn Rubi on 12/21/2015 15:04:20 [name], [name] ([id number]) -------------------------------------------------------------------------------- Progress Note Details Patient Name: [name], [name] Date of Service: 12/21/2015 2:15 PM Medical Record Number: [id number] Patient Account Number: [id number] Date of Birth/Sex: 06/07/1924 (82 y.o. Female) Treating RN: Curtis Sitesorthy, Joanna Primary Care Physician: Rolm GalaGrandis, Heidi Other Clinician: Referring Physician: Rolm GalaGrandis, Heidi Treating Physician/Extender: Rudene ReBritto, Julio Zappia Weeks in Treatment: 14 Subjective Chief Complaint Information obtained from Patient Patient is at the clinic for treatment of an open pressure ulcer right heel which she's had for about 8  weeks History of Present Illness (HPI) The following HPI elements were documented for the patient's wound: Location: right heel Quality: Patient reports experiencing a dull pain to affected area(s). Severity: Patient states wound are getting worse. Duration: Patient has had the wound for < 8 weeks prior to presenting for treatment Timing: Pain in wound is Intermittent (comes and goes Context: The wound occurred when the patient the hip fracture and was in traction for a few days while in hospital Modifying Factors: Other treatment(s) tried include:local care symptomatic treatment Associated Signs and Symptoms: Patient reports having increase discharge. 82 year old patient has been referred to as by her PCP for a pressure ulcer of the right heel. his had this for about 8 weeks Has been treated with duoderm. Has been there for a while it's getting more painful and larger and she also was seen for a recent urinary tract infection. She was given ciprofloxacin for this and then changed on to nitrofurantoin. She has a past medical history of rheumatic fever, hyperlipidemia, hypertension, scoliosis and osteopenia. never been a smoker. 10/05/2015 -- x-ray  of the right heel -- IMPRESSION:No osseous finding of significance. Abnormal appearance of the soft tissues posterior to the heel which could be due to infection or trauma. I do not see an intact Achilles tendon shadow. 12/07/2015 -- the patient has been approved for grafix and we will get this ready for her next week. 12/14/2015 -- she is here for her first application of Grafix. 12/21/2015 - the cause of the holiday we will change her application of Grafix to Monday and will order 16mm size for this day Objective Stitely, [name] ([id number]) Constitutional Pulse regular. Respirations normal and unlabored. Afebrile. Vitals Time Taken: 2:28 PM, Height: 59 in, Weight: 108 lbs, BMI: 21.8, Temperature: 98.1 F, Pulse: 93 bpm, Respiratory Rate:  16 breaths/min, Blood Pressure: 124/73 mmHg. Eyes Nonicteric. Reactive to light. Ears, Nose, Mouth, and Throat Lips, teeth, and gums WNL.Marland Kitchen Moist mucosa without lesions. Neck supple and nontender. No palpable supraclavicular or cervical adenopathy. Normal sized without goiter. Respiratory WNL. No retractions.. Breath sounds WNL, No rubs, rales, rhonchi, or wheeze.. Cardiovascular Heart rhythm and rate regular, no murmur or gallop.. Pedal Pulses WNL. No clubbing, cyanosis or edema. Chest Breasts symmetical and no nipple discharge.. Breast tissue WNL, no masses, lumps, or tenderness.. Lymphatic No adneopathy. No adenopathy. No adenopathy. Musculoskeletal Adexa without tenderness or enlargement.. Digits and nails w/o clubbing, cyanosis, infection, petechiae, ischemia, or inflammatory conditions.Marland Kitchen Psychiatric Judgement and insight Intact.. No evidence of depression, anxiety, or agitation.. General Notes: the wound bed is fairly clean but has surrounding eschar and subcutaneous debris and after removing this the wound does come in nicely. Integumentary (Hair, Skin) No suspicious lesions. No crepitus or fluctuance. No peri-wound warmth or erythema. No masses.. Wound #1 status is Open. Original cause of wound was Pressure Injury. The wound is located on the Right Calcaneus. The wound measures 0.4cm length x 0.3cm width x 0.2cm depth; 0.094cm^2 area and 0.019cm^3 volume. There is fat exposed. There is no tunneling or undermining noted. There is a large amount of serous drainage noted. The wound margin is flat and intact. There is large (67-100%) red granulation within the wound bed. There is a small (1-33%) amount of necrotic tissue within the wound bed including Adherent Slough. The periwound skin appearance exhibited: Scarring, Dry/Scaly, Maceration, Erythema. The periwound skin appearance did not exhibit: Callus, Crepitus, Excoriation, Fluctuance, Friable, Induration, Localized Edema, Rash,  Moist, Atrophie Blanche, Cyanosis, Ecchymosis, Hemosiderin Kahan, Addasyn L. ([id number]) Staining, Mottled, Pallor, Rubor. The surrounding wound skin color is noted with erythema which is circumferential. Periwound temperature was noted as No Abnormality. The periwound has tenderness on palpation. Assessment Active Problems ICD-10 L89.613 - Pressure ulcer of right heel, stage 3 E44.1 - Mild protein-calorie malnutrition Procedures Wound #1 Wound #1 is a Pressure Ulcer located on the Right Calcaneus . There was a Skin/Subcutaneous Tissue Debridement ([id number]) debridement with total area of 0.12 sq cm performed by Evlyn Kanner, MD. with the following instrument(s): Curette to remove Viable and Non-Viable tissue/material including Fibrin/Slough, Callus, and Subcutaneous after achieving pain control using Lidocaine 4% Topical Solution. A time out was conducted at 14:51, prior to the start of the procedure. A Minimum amount of bleeding was controlled with Pressure. The procedure was tolerated well with a pain level of 0 throughout and a pain level of 0 following the procedure. Post Debridement Measurements: 0.4cm length x 0.3cm width x 0.3cm depth; 0.028cm^3 volume. Post debridement Stage noted as Category/Stage III. Character of Wound/Ulcer Post Debridement is improved. Severity of Tissue Post Debridement is: Fat  layer exposed. Post procedure Diagnosis Wound #1: Same as Pre-Procedure Plan Wound Cleansing: Wound #1 Right Calcaneus: Cleanse wound with mild soap and water May Shower, gently pat wound dry prior to applying new dressing. May shower with protection. Anesthetic: Wound #1 Right Calcaneus: Stoffer, Shannel L. ([id number]) Topical Lidocaine 4% cream applied to wound bed prior to debridement - In Clinic Skin Barriers/Peri-Wound Care: Wound #1 Right Calcaneus: Other: - may apply TCA cream to peri wound skin for itching Primary Wound Dressing: Wound #1 Right Calcaneus: Other: -  polymem with silver Secondary Dressing: Wound #1 Right Calcaneus: Gauze and Kerlix/Conform - netting Dressing Change Frequency: Wound #1 Right Calcaneus: Three times weekly - For Home Health Specifics Other: - Please leave dressing intact until Mondays appointment Follow-up Appointments: Wound #1 Right Calcaneus: Return Appointment in 1 week. Off-Loading: Wound #1 Right Calcaneus: Heel suspension boot to: - [employer] Additional Orders / Instructions: Wound #1 Right Calcaneus: Increase protein intake. Activity as tolerated Other: - Include these vitamins in your diet, MVI, ZINC, Vit C, Vit A Home Health: Wound #1 Right Calcaneus: Continue Home Health Visits - [facility] Nurse may visit PRN to address patient s wound care needs. FACE TO FACE ENCOUNTER: MEDICARE and MEDICAID PATIENTS: I certify that this patient is under my care and that I had a face-to-face encounter that meets the physician face-to-face encounter requirements with this patient on this date. The encounter with the patient was in whole or in part for the following MEDICAL CONDITION: (primary reason for Home Healthcare) MEDICAL NECESSITY: I certify, that based on my findings, NURSING services are a medically necessary home health service. HOME BOUND STATUS: I certify that my clinical findings support that this patient is homebound (i.e., Due to illness or injury, pt requires aid of supportive devices such as crutches, cane, wheelchairs, walkers, the use of special transportation or the assistance of another person to leave their place of residence. There is a normal inability to leave the home and doing so requires considerable and taxing effort. Other absences are for medical reasons / religious services and are infrequent or of short duration when for other reasons). If current dressing causes regression in wound condition, may D/C ordered dressing product/s and apply Normal Saline Moist Dressing daily  until next Wound Healing Center / Other MD appointment. Notify Wound Healing Center of regression in wound condition at [phone]. Please direct any NON-WOUND related issues/requests for orders to patient's Primary Care Physician [name], [name] ([id number]) we will prepare her for a next application of Grafix this coming Monday and in the meanwhile use some PolyMem foam over this heel to protect it Electronic Signature(s) Signed: 12/21/2015 3:05:54 PM By: Evlyn Kanner MD, FACS Entered By: Evlyn Kanner on 12/21/2015 15:05:54 [name] ([id number]) -------------------------------------------------------------------------------- SuperBill Details Patient Name: [name] Date of Service: 12/21/2015 Medical Record Number: [id number] Patient Account Number: [id number] Date of Birth/Sex: 1924/11/26 (82 y.o. Female) Treating RN: Curtis Sites Primary Care Physician: Rolm Gala Other Clinician: Referring Physician: Rolm Gala Treating Physician/Extender: Rudene Re in Treatment: 14 Diagnosis Coding ICD-10 Codes Code Description [phone] Pressure ulcer of right heel, stage 3 E44.1 Mild protein-calorie malnutrition Facility Procedures CPT4 Code: [id number] Description: 11042 - DEB SUBQ TISSUE 20 SQ CM/< ICD-10 Description Diagnosis L89.613 Pressure ulcer of right heel, stage 3 E44.1 Mild protein-calorie malnutrition Modifier: Quantity: 1 Physician Procedures CPT4 Code: [id number] Description: 11042 - WC PHYS SUBQ TISS 20 SQ CM ICD-10 Description Diagnosis L89.613 Pressure ulcer of right heel, stage  3 E44.1 Mild protein-calorie malnutrition Modifier: Quantity: 1 Electronic Signature(s) Signed: 12/21/2015 3:06:03 PM By: Evlyn KannerBritto, Annaya Bangert MD, FACS Entered By: Evlyn KannerBritto, Manaia Samad on 12/21/2015 15:06:02

## 2015-12-25 ENCOUNTER — Encounter: Payer: Medicare Other | Admitting: Surgery

## 2015-12-25 DIAGNOSIS — L89613 Pressure ulcer of right heel, stage 3: Secondary | ICD-10-CM | POA: Diagnosis not present

## 2015-12-26 NOTE — Progress Notes (Signed)
ADDYSIN, PORCO ([id number]) Visit Report for 12/25/2015 Arrival Information Details Patient Name: LORIANA, SAMAD Date of Service: 12/25/2015 3:45 PM Medical Record Number: [id number] Patient Account Number: [id number] Date of Birth/Sex: Dec 05, 1924 (82 y.o. Female) Treating RN: Curtis Sites Primary Care Physician: Rolm Gala Other Clinician: Referring Physician: Rolm Gala Treating Physician/Extender: Rudene Re in Treatment: 14 Visit Information History Since Last Visit Added or deleted any medications: No Patient Arrived: Wheel Chair Any new allergies or adverse reactions: No Arrival Time: 15:43 Had a fall or experienced change in No Accompanied By: dtr activities of daily living that may affect Transfer Assistance: Manual risk of falls: Patient Identification Verified: Yes Signs or symptoms of abuse/neglect since last No Secondary Verification Process Yes visito Completed: Hospitalized since last visit: No Patient Requires Transmission- No Pain Present Now: No Based Precautions: Patient Has Alerts: Yes Patient Alerts: Patient on Blood Thinner eliquis Electronic Signature(s) Signed: 12/25/2015 5:38:29 PM By: Curtis Sites Entered By: Curtis Sites on 12/25/2015 15:47:40 Closs, Theora Gianotti ([id number]) -------------------------------------------------------------------------------- Clinic Level of Care Assessment Details Patient Name: [name] Date of Service: 12/25/2015 3:45 PM Medical Record Number: [id number] Patient Account Number: [id number] Date of Birth/Sex: 1924-11-11 (82 y.o. Female) Treating RN: Curtis Sites Primary Care Physician: Rolm Gala Other Clinician: Referring Physician: Rolm Gala Treating Physician/Extender: Rudene Re in Treatment: 14 Clinic Level of Care Assessment Items TOOL 4 Quantity Score []  - Use when only an EandM is performed on FOLLOW-UP visit 0 ASSESSMENTS - Nursing Assessment /  Reassessment X - Reassessment of Co-morbidities (includes updates in patient status) 1 10 X - Reassessment of Adherence to Treatment Plan 1 5 ASSESSMENTS - Wound and Skin Assessment / Reassessment X - Simple Wound Assessment / Reassessment - one wound 1 5 []  - Complex Wound Assessment / Reassessment - multiple wounds 0 []  - Dermatologic / Skin Assessment (not related to wound area) 0 ASSESSMENTS - Focused Assessment []  - Circumferential Edema Measurements - multi extremities 0 []  - Nutritional Assessment / Counseling / Intervention 0 []  - Lower Extremity Assessment (monofilament, tuning fork, pulses) 0 []  - Peripheral Arterial Disease Assessment (using hand held doppler) 0 ASSESSMENTS - Ostomy and/or Continence Assessment and Care []  - Incontinence Assessment and Management 0 []  - Ostomy Care Assessment and Management (repouching, etc.) 0 PROCESS - Coordination of Care X - Simple Patient / Family Education for ongoing care 1 15 []  - Complex (extensive) Patient / Family Education for ongoing care 0 []  - Staff obtains Chiropractor, Records, Test Results / Process Orders 0 []  - Staff telephones HHA, Nursing Homes / Clarify orders / etc 0 []  - Routine Transfer to another Facility (non-emergent condition) 0 [place], Emiko L. ([id number]) []  - Routine Hospital Admission (non-emergent condition) 0 []  - New Admissions / Manufacturing engineer / Ordering NPWT, Apligraf, etc. 0 []  - Emergency Hospital Admission (emergent condition) 0 X - Simple Discharge Coordination 1 10 []  - Complex (extensive) Discharge Coordination 0 PROCESS - Special Needs []  - Pediatric / Minor Patient Management 0 []  - Isolation Patient Management 0 []  - Hearing / Language / Visual special needs 0 []  - Assessment of Community assistance (transportation, D/C planning, etc.) 0 []  - Additional assistance / Altered mentation 0 []  - Support Surface(s) Assessment (bed, cushion, seat, etc.) 0 INTERVENTIONS - Wound Cleansing /  Measurement X - Simple Wound Cleansing - one wound 1 5 []  - Complex Wound Cleansing - multiple wounds 0 X - Wound Imaging (photographs - any number of wounds) 1 5 []  -  Wound Tracing (instead of photographs) 0 X - Simple Wound Measurement - one wound 1 5 []  - Complex Wound Measurement - multiple wounds 0 INTERVENTIONS - Wound Dressings []  - Small Wound Dressing one or multiple wounds 0 X - Medium Wound Dressing one or multiple wounds 1 15 []  - Large Wound Dressing one or multiple wounds 0 []  - Application of Medications - topical 0 []  - Application of Medications - injection 0 INTERVENTIONS - Miscellaneous []  - External ear exam 0 Muldoon, Marien L. ([id number]) []  - Specimen Collection (cultures, biopsies, blood, body fluids, etc.) 0 []  - Specimen(s) / Culture(s) sent or taken to Lab for analysis 0 []  - Patient Transfer (multiple staff / Michiel SitesHoyer Lift / Similar devices) 0 []  - Simple Staple / Suture removal (25 or less) 0 []  - Complex Staple / Suture removal (26 or more) 0 []  - Hypo / Hyperglycemic Management (close monitor of Blood Glucose) 0 []  - Ankle / Brachial Index (ABI) - do not check if billed separately 0 X - Vital Signs 1 5 Has the patient been seen at the hospital within the last three years: Yes Total Score: 80 Level Of Care: New/Established - Level 3 Electronic Signature(s) Signed: 12/25/2015 5:38:29 PM By: Curtis Sitesorthy, Joanna Entered By: Curtis Sitesorthy, Joanna on 12/25/2015 17:19:29 [name], Zikeria L. ([id number]) -------------------------------------------------------------------------------- Encounter Discharge Information Details Patient Name: [name], Baneen L. Date of Service: 12/25/2015 3:45 PM Medical Record Number: [id number] Patient Account Number: [id number] Date of Birth/Sex: 03/11/1924 (82 y.o. Female) Treating RN: Curtis Sitesorthy, Joanna Primary Care Physician: Rolm GalaGrandis, Heidi Other Clinician: Referring Physician: Rolm GalaGrandis, Heidi Treating Physician/Extender: Rudene ReBritto, Errol Weeks in  Treatment: 4014 Encounter Discharge Information Items Discharge Pain Level: 0 Discharge Condition: Stable Ambulatory Status: Wheelchair Discharge Destination: Home Transportation: Private Auto Accompanied By: dtr Schedule Follow-up Appointment: Yes Medication Reconciliation completed and provided to Patient/Care No Namon Villarin: Provided on Clinical Summary of Care: 12/25/2015 Form Type Recipient Paper Patient DI Electronic Signature(s) Signed: 12/25/2015 5:20:19 PM By: Curtis Sitesorthy, Joanna Previous Signature: 12/25/2015 4:23:28 PM Version By: Gwenlyn PerkingMoore, Shelia Entered By: Curtis Sitesorthy, Joanna on 12/25/2015 17:20:19 Sheffer, [name] ([id number]) -------------------------------------------------------------------------------- Multi Wound Chart Details Patient Name: [name], Jadin L. Date of Service: 12/25/2015 3:45 PM Medical Record Number: [id number] Patient Account Number: [id number] Date of Birth/Sex: 06/05/1924 (82 y.o. Female) Treating RN: Curtis Sitesorthy, Joanna Primary Care Physician: Rolm GalaGrandis, Heidi Other Clinician: Referring Physician: Rolm GalaGrandis, Heidi Treating Physician/Extender: Rudene ReBritto, Errol Weeks in Treatment: 14 Vital Signs Height(in): 59 Pulse(bpm): 80 Weight(lbs): 108 Blood Pressure 118/90 (mmHg): Body Mass Index(BMI): 22 Temperature(F): 98.3 Respiratory Rate 16 (breaths/min): Photos: [1:No Photos] [N/A:N/A] Wound Location: [1:Right Calcaneus] [N/A:N/A] Wounding Event: [1:Pressure Injury] [N/A:N/A] Primary Etiology: [1:Pressure Ulcer] [N/A:N/A] Comorbid History: [1:Deep Vein Thrombosis, Hypertension, Dementia] [N/A:N/A] Date Acquired: [1:07/14/2015] [N/A:N/A] Weeks of Treatment: [1:14] [N/A:N/A] Wound Status: [1:Open] [N/A:N/A] Measurements L x W x D 0.1x0.1x0.1 [N/A:N/A] (cm) Area (cm) : [1:0.008] [N/A:N/A] Volume (cm) : [1:0.001] [N/A:N/A] % Reduction in Area: [1:99.90%] [N/A:N/A] % Reduction in Volume: 99.90% [N/A:N/A] Classification: [1:Category/Stage III]  [N/A:N/A] Exudate Amount: [1:Small] [N/A:N/A] Exudate Type: [1:Serous] [N/A:N/A] Exudate Color: [1:amber] [N/A:N/A] Wound Margin: [1:Flat and Intact] [N/A:N/A] Granulation Amount: [1:Large (67-100%)] [N/A:N/A] Granulation Quality: [1:Red] [N/A:N/A] Necrotic Amount: [1:None Present (0%)] [N/A:N/A] Exposed Structures: [1:Fat: Yes Fascia: No Tendon: No Muscle: No Joint: No Bone: No] [N/A:N/A] Epithelialization: [1:Small (1-33%)] [N/A:N/A] Periwound Skin Texture: Scarring: Yes N/A N/A Edema: No Excoriation: No Induration: No Callus: No Crepitus: No Fluctuance: No Friable: No Rash: No Periwound Skin Maceration: Yes N/A N/A Moisture: Dry/Scaly: Yes Moist: No Periwound Skin Color: Erythema:  Yes N/A N/A Atrophie Blanche: No Cyanosis: No Ecchymosis: No Hemosiderin Staining: No Mottled: No Pallor: No Rubor: No Erythema Location: Circumferential N/A N/A Temperature: No Abnormality N/A N/A Tenderness on Yes N/A N/A Palpation: Wound Preparation: Ulcer Cleansing: N/A N/A Rinsed/Irrigated with Saline Topical Anesthetic Applied: Other: lidocaine 4% Treatment Notes Electronic Signature(s) Signed: 12/25/2015 5:18:56 PM By: Curtis Sitesorthy, Joanna Entered By: Curtis Sitesorthy, Joanna on 12/25/2015 17:18:56 [name], Merly L. ([id number]) -------------------------------------------------------------------------------- Multi-Disciplinary Care Plan Details Patient Name: [name], Shaneque L. Date of Service: 12/25/2015 3:45 PM Medical Record Number: [id number] Patient Account Number: [id number] Date of Birth/Sex: 04/06/1924 (82 y.o. Female) Treating RN: Curtis Sitesorthy, Joanna Primary Care Physician: Rolm GalaGrandis, Heidi Other Clinician: Referring Physician: Rolm GalaGrandis, Heidi Treating Physician/Extender: Rudene ReBritto, Errol Weeks in Treatment: 1514 Active Inactive Abuse / Safety / Falls / Self Care Management Nursing Diagnoses: Impaired home maintenance Impaired physical mobility Potential for falls Self care deficit: actual  or potential Goals: Patient will remain injury free Date Initiated: 09/14/2015 Goal Status: Active Patient/caregiver will verbalize understanding of skin care regimen Date Initiated: 09/14/2015 Goal Status: Active Patient/caregiver will verbalize/demonstrate measure taken to improve self care Date Initiated: 09/14/2015 Goal Status: Active Patient/caregiver will verbalize/demonstrate measures taken to improve the patient's personal safety Date Initiated: 09/14/2015 Goal Status: Active Patient/caregiver will verbalize/demonstrate measures taken to prevent injury and/or falls Date Initiated: 09/14/2015 Goal Status: Active Patient/caregiver will verbalize/demonstrate understanding of what to do in case of emergency Date Initiated: 09/14/2015 Goal Status: Active Interventions: Assess fall risk on admission and as needed Assess: immobility, friction, shearing, incontinence upon admission and as needed Assess impairment of mobility on admission and as needed per policy Assess self care needs on admission and as needed Provide education on basic hygiene Provide education on personal and home safety Harvel QualeSLEY, Deanna L. ([id number]) Provide education on safe transfers Notes: Orientation to the Wound Care Program Nursing Diagnoses: Knowledge deficit related to the wound healing center program Goals: Patient/caregiver will verbalize understanding of the Wound Healing Center Program Date Initiated: 09/14/2015 Goal Status: Active Interventions: Provide education on orientation to the wound center Notes: Pressure Nursing Diagnoses: Knowledge deficit related to causes and risk factors for pressure ulcer development Knowledge deficit related to management of pressures ulcers Potential for impaired tissue integrity related to pressure, friction, moisture, and shear Goals: Patient will remain free from development of additional pressure ulcers Date Initiated: 09/14/2015 Goal Status: Active Patient  will remain free of pressure ulcers Date Initiated: 09/14/2015 Goal Status: Active Patient/caregiver will verbalize risk factors for pressure ulcer development Date Initiated: 09/14/2015 Goal Status: Active Patient/caregiver will verbalize understanding of pressure ulcer management Date Initiated: 09/14/2015 Goal Status: Active Interventions: Assess: immobility, friction, shearing, incontinence upon admission and as needed Assess offloading mechanisms upon admission and as needed Assess potential for pressure ulcer upon admission and as needed Provide education on pressure ulcers Treatment Activities: Harvel QualeSLEY, Lataisha L. ([id number]) Patient referred for pressure reduction/relief devices : 09/14/2015 Patient referred for seating evaluation to ensure proper offloading : 09/14/2015 Pressure reduction/relief device ordered : 09/14/2015 Notes: Wound/Skin Impairment Nursing Diagnoses: Impaired tissue integrity Knowledge deficit related to ulceration/compromised skin integrity Goals: Patient/caregiver will verbalize understanding of skin care regimen Date Initiated: 09/14/2015 Goal Status: Active Ulcer/skin breakdown will have a volume reduction of 30% by week 4 Date Initiated: 09/14/2015 Goal Status: Active Ulcer/skin breakdown will have a volume reduction of 50% by week 8 Date Initiated: 09/14/2015 Goal Status: Active Ulcer/skin breakdown will have a volume reduction of 80% by week 12 Date Initiated: 09/14/2015 Goal Status: Active Ulcer/skin breakdown will heal  within 14 weeks Date Initiated: 09/14/2015 Goal Status: Active Interventions: Assess patient/caregiver ability to obtain necessary supplies Assess patient/caregiver ability to perform ulcer/skin care regimen upon admission and as needed Assess ulceration(s) every visit Provide education on ulcer and skin care Treatment Activities: Referred to DME Adyline Huberty for dressing supplies : 09/14/2015 Skin care regimen initiated :  09/14/2015 Topical wound management initiated : 09/14/2015 Notes: Electronic Signature(s) Signed: 12/25/2015 5:17:47 PM By: Curtis Sites Entered By: Curtis Sites on 12/25/2015 17:17:46 [name] ([id number] Mallick, Theora Gianotti ([id number]) -------------------------------------------------------------------------------- Pain Assessment Details Patient Name: [name] Date of Service: 12/25/2015 3:45 PM Medical Record Number: [id number] Patient Account Number: [id number] Date of Birth/Sex: July 10, 1924 (82 y.o. Female) Treating RN: Curtis Sites Primary Care Physician: Rolm Gala Other Clinician: Referring Physician: Rolm Gala Treating Physician/Extender: Rudene Re in Treatment: 14 Active Problems Location of Pain Severity and Description of Pain Patient Has Paino No Site Locations Pain Management and Medication Current Pain Management: Notes Topical or injectable lidocaine is offered to patient for acute pain when surgical debridement is performed. If needed, Patient is instructed to use over the counter pain medication for the following 24-48 hours after debridement. Wound care MDs do not prescribed pain medications. Patient has chronic pain or uncontrolled pain. Patient has been instructed to make an appointment with their Primary Care Physician for pain management. Electronic Signature(s) Signed: 12/25/2015 5:38:29 PM By: Curtis Sites Entered By: Curtis Sites on 12/25/2015 15:47:56 [name] ([id number]) -------------------------------------------------------------------------------- Patient/Caregiver Education Details Patient Name: [name] Date of Service: 12/25/2015 3:45 PM Medical Record Number: [id number] Patient Account Number: [id number] Date of Birth/Gender: 08-11-1924 (82 y.o. Female) Treating RN: Curtis Sites Primary Care Physician: Rolm Gala Other Clinician: Referring Physician: Rolm Gala Treating  Physician/Extender: Rudene Re in Treatment: 14 Education Assessment Education Provided To: Caregiver Education Topics Provided Wound/Skin Impairment: Handouts: Other: leave dressing intact until next appointment Methods: Explain/Verbal Responses: State content correctly Electronic Signature(s) Signed: 12/25/2015 5:38:29 PM By: Curtis Sites Entered By: Curtis Sites on 12/25/2015 17:20:41 [name] ([id number]) -------------------------------------------------------------------------------- Wound Assessment Details Patient Name: [name] Date of Service: 12/25/2015 3:45 PM Medical Record Number: [id number] Patient Account Number: [id number] Date of Birth/Sex: May 24, 1924 (82 y.o. Female) Treating RN: Curtis Sites Primary Care Physician: Rolm Gala Other Clinician: Referring Physician: Rolm Gala Treating Physician/Extender: Rudene Re in Treatment: 14 Wound Status Wound Number: 1 Primary Pressure Ulcer Etiology: Wound Location: Right Calcaneus Wound Status: Open Wounding Event: Pressure Injury Comorbid Deep Vein Thrombosis, Date Acquired: 07/14/2015 History: Hypertension, Dementia Weeks Of Treatment: 14 Clustered Wound: No Wound Measurements Length: (cm) 0.1 Width: (cm) 0.1 Depth: (cm) 0.1 Area: (cm) 0.008 Volume: (cm) 0.001 % Reduction in Area: 99.9% % Reduction in Volume: 99.9% Epithelialization: Small (1-33%) Tunneling: No Undermining: No Wound Description Classification: Category/Stage III Foul Odor Af Wound Margin: Flat and Intact Exudate Amount: Small Exudate Type: Serous Exudate Color: amber ter Cleansing: No Wound Bed Granulation Amount: Large (67-100%) Exposed Structure Granulation Quality: Red Fascia Exposed: No Necrotic Amount: None Present (0%) Fat Layer Exposed: Yes Tendon Exposed: No Muscle Exposed: No Joint Exposed: No Bone Exposed: No Periwound Skin Texture Texture Color No Abnormalities  Noted: No No Abnormalities Noted: No Callus: No Atrophie Blanche: No Crepitus: No Cyanosis: No Excoriation: No Ecchymosis: No Fluctuance: No Erythema: Yes Friable: No Erythema Location: Circumferential Lessley, Mekiyah L. ([id number]) Induration: No Hemosiderin Staining: No Localized Edema: No Mottled: No Rash: No Pallor: No Scarring: Yes Rubor: No Moisture Temperature / Pain No Abnormalities  Noted: No Temperature: No Abnormality Dry / Scaly: Yes Tenderness on Palpation: Yes Maceration: Yes Moist: No Wound Preparation Ulcer Cleansing: Rinsed/Irrigated with Saline Topical Anesthetic Applied: Other: lidocaine 4%, Treatment Notes Wound #1 (Right Calcaneus) 1. Cleansed with: Clean wound with Normal Saline 2. Anesthetic Topical Lidocaine 4% cream to wound bed prior to debridement 4. Dressing Applied: Other dressing (specify in notes) 5. Secondary Dressing Applied ABD Pad Kerlix/Conform Notes polymem with silver, coban to secure Electronic Signature(s) Signed: 12/25/2015 5:38:29 PM By: Curtis Sites Entered By: Curtis Sites on 12/25/2015 17:18:16 [name] ([id number]) -------------------------------------------------------------------------------- Vitals Details Patient Name: [name] Date of Service: 12/25/2015 3:45 PM Medical Record Number: [id number] Patient Account Number: [id number] Date of Birth/Sex: March 10, 1924 (82 y.o. Female) Treating RN: Curtis Sites Primary Care Physician: Rolm Gala Other Clinician: Referring Physician: Rolm Gala Treating Physician/Extender: Rudene Re in Treatment: 14 Vital Signs Time Taken: 15:48 Temperature (F): 98.3 Height (in): 59 Pulse (bpm): 80 Weight (lbs): 108 Respiratory Rate (breaths/min): 16 Body Mass Index (BMI): 21.8 Blood Pressure (mmHg): 118/90 Reference Range: 80 - 120 mg / dl Electronic Signature(s) Signed: 12/25/2015 5:38:29 PM By: Curtis Sites Entered By: Curtis Sites on 12/25/2015 15:49:12

## 2015-12-26 NOTE — Progress Notes (Addendum)
Colleen QualeSLEY, Lee-Anne L. ([id number]) Visit Report for 12/25/2015 Chief Complaint Document Details Patient Name: Colleen QualeSLEY, Cailie L. Date of Service: 12/25/2015 3:45 PM Medical Record Number: [id number] Patient Account Number: [id number] Date of Birth/Sex: 10/21/1924 (82 y.o. Female) Treating RN: Curtis Sitesorthy, Joanna Primary Care Physician: Rolm GalaGrandis, Heidi Other Clinician: Referring Physician: Rolm GalaGrandis, Heidi Treating Physician/Extender: Rudene ReBritto, Zakar Brosch Weeks in Treatment: 14 Information Obtained from: Patient Chief Complaint Patient is at the clinic for treatment of an open pressure ulcer right heel which she's had for about 8 weeks Electronic Signature(s) Signed: 12/25/2015 4:24:22 PM By: Evlyn KannerBritto, Azai Gaffin MD, FACS Entered By: Evlyn KannerBritto, Gricel Copen on 12/25/2015 16:24:22 Colleen QualeISLEY, Layani L. ([id number]) -------------------------------------------------------------------------------- HPI Details Patient Name: Colleen QualeISLEY, Airam L. Date of Service: 12/25/2015 3:45 PM Medical Record Number: [id number] Patient Account Number: [id number] Date of Birth/Sex: 06/13/1924 (82 y.o. Female) Treating RN: Curtis Sitesorthy, Joanna Primary Care Physician: Rolm GalaGrandis, Heidi Other Clinician: Referring Physician: Rolm GalaGrandis, Heidi Treating Physician/Extender: Rudene ReBritto, Favour Aleshire Weeks in Treatment: 14 History of Present Illness Location: right heel Quality: Patient reports experiencing a dull pain to affected area(s). Severity: Patient states wound are getting worse. Duration: Patient has had the wound for < 8 weeks prior to presenting for treatment Timing: Pain in wound is Intermittent (comes and goes Context: The wound occurred when the patient the hip fracture and was in traction for a few days while in hospital Modifying Factors: Other treatment(s) tried include:local care symptomatic treatment Associated Signs and Symptoms: Patient reports having increase discharge. HPI Description: 82 year old patient has been referred to as by her PCP for a pressure  ulcer of the right heel. his had this for about 8 weeks Has been treated with duoderm. Has been there for a while it's getting more painful and larger and she also was seen for a recent urinary tract infection. She was given ciprofloxacin for this and then changed on to nitrofurantoin. She has a past medical history of rheumatic fever, hyperlipidemia, hypertension, scoliosis and osteopenia. never been a smoker. 10/05/2015 -- x-ray of the right heel -- IMPRESSION:No osseous finding of significance. Abnormal appearance of the soft tissues posterior to the heel which could be due to infection or trauma. I do not see an intact Achilles tendon shadow. 12/07/2015 -- the patient has been approved for grafix and we will get this ready for her next week. 12/14/2015 -- she is here for her first application of Grafix. 12/21/2015 - the cause of the holiday we will change her application of Grafix to Monday and will order 16mm size for this day Electronic Signature(s) Signed: 12/25/2015 4:24:44 PM By: Evlyn KannerBritto, Ceanna Wareing MD, FACS Entered By: Evlyn KannerBritto, Juma Oxley on 12/25/2015 16:24:44 Colleen QualeISLEY, Kalyani L. ([id number]) -------------------------------------------------------------------------------- Physical Exam Details Patient Name: Colleen QualeISLEY, Loyalty L. Date of Service: 12/25/2015 3:45 PM Medical Record Number: [id number] Patient Account Number: [id number] Date of Birth/Sex: 09/17/1924 (82 y.o. Female) Treating RN: Curtis Sitesorthy, Joanna Primary Care Physician: Rolm GalaGrandis, Heidi Other Clinician: Referring Physician: Rolm GalaGrandis, Heidi Treating Physician/Extender: Rudene ReBritto, Demaya Hardge Weeks in Treatment: 14 Constitutional . Pulse regular. Respirations normal and unlabored. Afebrile. . Eyes Nonicteric. Reactive to light. Ears, Nose, Mouth, and Throat Lips, teeth, and gums WNL.Marland Kitchen. Moist mucosa without lesions. Neck supple and nontender. No palpable supraclavicular or cervical adenopathy. Normal sized without goiter. Respiratory WNL. No  retractions.. Cardiovascular Pedal Pulses WNL. No clubbing, cyanosis or edema. Lymphatic No adneopathy. No adenopathy. No adenopathy. Musculoskeletal Adexa without tenderness or enlargement.. Digits and nails w/o clubbing, cyanosis, infection, petechiae, ischemia, or inflammatory conditions.. Integumentary (Hair, Skin) No suspicious lesions. No crepitus or fluctuance. No peri-wound warmth or  erythema. No masses.Marland Kitchen Psychiatric Judgement and insight Intact.. No evidence of depression, anxiety, or agitation.. Notes I have carefully peeled off a layer of exudate and under this there is no open wound. Electronic Signature(s) Signed: 12/25/2015 4:25:10 PM By: Evlyn Kanner MD, FACS Entered By: Evlyn Kanner on 12/25/2015 16:25:10 [name] ([id number]) -------------------------------------------------------------------------------- Physician Orders Details Patient Name: [name] Date of Service: 12/25/2015 3:45 PM Medical Record Number: [id number] Patient Account Number: [id number] Date of Birth/Sex: 20-Jan-1925 (82 y.o. Female) Treating RN: Curtis Sites Primary Care Physician: Rolm Gala Other Clinician: Referring Physician: Rolm Gala Treating Physician/Extender: Rudene Re in Treatment: 13 Verbal / Phone Orders: Yes Clinician: Curtis Sites Read Back and Verified: Yes Diagnosis Coding Wound Cleansing Wound #1 Right Calcaneus o Cleanse wound with mild soap and water o May Shower, gently pat wound dry prior to applying new dressing. o May shower with protection. Anesthetic Wound #1 Right Calcaneus o Topical Lidocaine 4% cream applied to wound bed prior to debridement - In Clinic Skin Barriers/Peri-Wound Care Wound #1 Right Calcaneus o Other: - may apply TCA cream to peri wound skin for itching Primary Wound Dressing Wound #1 Right Calcaneus o Other: - polymem with silver Secondary Dressing Wound #1 Right Calcaneus o Gauze and  Kerlix/Conform - netting Dressing Change Frequency Wound #1 Right Calcaneus o Other: - Please leave dressing intact until Thursdays appointment Follow-up Appointments Wound #1 Right Calcaneus o Return Appointment in 1 week. Off-Loading Wound #1 Right Calcaneus o Heel suspension boot to: [name], Mariha L. ([id number]) Additional Orders / Instructions Wound #1 Right Calcaneus o Increase protein intake. o Activity as tolerated o Other: - Include these vitamins in your diet, MVI, ZINC, Vit C, Vit A Home Health Wound #1 Right Calcaneus o Continue Home Health Visits - [facility] o Home Health Nurse may visit PRN to address patientos wound care needs. o FACE TO FACE ENCOUNTER: MEDICARE and MEDICAID PATIENTS: I certify that this patient is under my care and that I had a face-to-face encounter that meets the physician face-to-face encounter requirements with this patient on this date. The encounter with the patient was in whole or in part for the following MEDICAL CONDITION: (primary reason for Home Healthcare) MEDICAL NECESSITY: I certify, that based on my findings, NURSING services are a medically necessary home health service. HOME BOUND STATUS: I certify that my clinical findings support that this patient is homebound (i.e., Due to illness or injury, pt requires aid of supportive devices such as crutches, cane, wheelchairs, walkers, the use of special transportation or the assistance of another person to leave their place of residence. There is a normal inability to leave the home and doing so requires considerable and taxing effort. Other absences are for medical reasons / religious services and are infrequent or of short duration when for other reasons). o If current dressing causes regression in wound condition, may D/C ordered dressing product/s and apply Normal Saline Moist Dressing daily until next Wound Healing Center / Other MD appointment. Notify  Wound Healing Center of regression in wound condition at [phone]. o Please direct any NON-WOUND related issues/requests for orders to patient's Primary Care Physician Electronic Signature(s) Signed: 12/25/2015 4:55:45 PM By: Evlyn Kanner MD, FACS Signed: 12/25/2015 5:38:29 PM By: Curtis Sites Entered By: Curtis Sites on 12/25/2015 16:22:41 [name] ([id number]) -------------------------------------------------------------------------------- Problem List Details Patient Name: [name] Date of Service: 12/25/2015 3:45 PM Medical Record Number: [id number] Patient Account Number: [id number] Date of Birth/Sex: [birth year] (82  y.o. Female) Treating RN: Curtis Sites Primary Care Physician: Rolm Gala Other Clinician: Referring Physician: Rolm Gala Treating Physician/Extender: Rudene Re in Treatment: 14 Active Problems ICD-10 Encounter Code Description Active Date Diagnosis L89.613 Pressure ulcer of right heel, stage 3 09/14/2015 Yes E44.1 Mild protein-calorie malnutrition 09/14/2015 Yes Inactive Problems Resolved Problems Electronic Signature(s) Signed: 12/25/2015 4:24:14 PM By: Evlyn Kanner MD, FACS Entered By: Evlyn Kanner on 12/25/2015 16:24:14 [name] ([id number]) -------------------------------------------------------------------------------- Progress Note Details Patient Name: [name] Date of Service: 12/25/2015 3:45 PM Medical Record Number: [id number] Patient Account Number: [id number] Date of Birth/Sex: Dec 05, 1924 (82 y.o. Female) Treating RN: Curtis Sites Primary Care Physician: Rolm Gala Other Clinician: Referring Physician: Rolm Gala Treating Physician/Extender: Rudene Re in Treatment: 14 Subjective Chief Complaint Information obtained from Patient Patient is at the clinic for treatment of an open pressure ulcer right heel which she's had for about 8 weeks History of Present  Illness (HPI) The following HPI elements were documented for the patient's wound: Location: right heel Quality: Patient reports experiencing a dull pain to affected area(s). Severity: Patient states wound are getting worse. Duration: Patient has had the wound for < 8 weeks prior to presenting for treatment Timing: Pain in wound is Intermittent (comes and goes Context: The wound occurred when the patient the hip fracture and was in traction for a few days while in hospital Modifying Factors: Other treatment(s) tried include:local care symptomatic treatment Associated Signs and Symptoms: Patient reports having increase discharge. 82 year old patient has been referred to as by her PCP for a pressure ulcer of the right heel. his had this for about 8 weeks Has been treated with duoderm. Has been there for a while it's getting more painful and larger and she also was seen for a recent urinary tract infection. She was given ciprofloxacin for this and then changed on to nitrofurantoin. She has a past medical history of rheumatic fever, hyperlipidemia, hypertension, scoliosis and osteopenia. never been a smoker. 10/05/2015 -- x-ray of the right heel -- IMPRESSION:No osseous finding of significance. Abnormal appearance of the soft tissues posterior to the heel which could be due to infection or trauma. I do not see an intact Achilles tendon shadow. 12/07/2015 -- the patient has been approved for grafix and we will get this ready for her next week. 12/14/2015 -- she is here for her first application of Grafix. 12/21/2015 - the cause of the holiday we will change her application of Grafix to Monday and will order 16mm size for this day Objective Leja, Twinkle L. ([id number]) Constitutional Pulse regular. Respirations normal and unlabored. Afebrile. Vitals Time Taken: 3:48 PM, Height: 59 in, Weight: 108 lbs, BMI: 21.8, Temperature: 98.3 F, Pulse: 80 bpm, Respiratory Rate: 16 breaths/min, Blood  Pressure: 118/90 mmHg. Eyes Nonicteric. Reactive to light. Ears, Nose, Mouth, and Throat Lips, teeth, and gums WNL.Marland Kitchen Moist mucosa without lesions. Neck supple and nontender. No palpable supraclavicular or cervical adenopathy. Normal sized without goiter. Respiratory WNL. No retractions.. Cardiovascular Pedal Pulses WNL. No clubbing, cyanosis or edema. Lymphatic No adneopathy. No adenopathy. No adenopathy. Musculoskeletal Adexa without tenderness or enlargement.. Digits and nails w/o clubbing, cyanosis, infection, petechiae, ischemia, or inflammatory conditions.Marland Kitchen Psychiatric Judgement and insight Intact.. No evidence of depression, anxiety, or agitation.. General Notes: I have carefully peeled off a layer of exudate and under this there is no open wound. Integumentary (Hair, Skin) No suspicious lesions. No crepitus or fluctuance. No peri-wound warmth or erythema. No masses.. Wound #1 status is Open. Original cause  of wound was Pressure Injury. The wound is located on the Right Calcaneus. The wound measures 0.1cm length x 0.1cm width x 0.1cm depth; 0.008cm^2 area and 0.001cm^3 volume. There is fat exposed. There is no tunneling or undermining noted. There is a small amount of serous drainage noted. The wound margin is flat and intact. There is large (67-100%) red granulation within the wound bed. There is no necrotic tissue within the wound bed. The periwound skin appearance exhibited: Scarring, Dry/Scaly, Maceration, Erythema. The periwound skin appearance did not exhibit: Callus, Crepitus, Excoriation, Fluctuance, Friable, Induration, Localized Edema, Rash, Moist, Atrophie Blanche, Cyanosis, Ecchymosis, Hemosiderin Staining, Mottled, Pallor, Rubor. The surrounding wound skin color is noted with erythema which is circumferential. Periwound temperature was noted as No Abnormality. The periwound has tenderness on palpation. KAYLYNE, AXTON ([id number]) Assessment Active  Problems ICD-10 L89.613 - Pressure ulcer of right heel, stage 3 E44.1 - Mild protein-calorie malnutrition Plan Wound Cleansing: Wound #1 Right Calcaneus: Cleanse wound with mild soap and water May Shower, gently pat wound dry prior to applying new dressing. May shower with protection. Anesthetic: Wound #1 Right Calcaneus: Topical Lidocaine 4% cream applied to wound bed prior to debridement - In Clinic Skin Barriers/Peri-Wound Care: Wound #1 Right Calcaneus: Other: - may apply TCA cream to peri wound skin for itching Primary Wound Dressing: Wound #1 Right Calcaneus: Other: - polymem with silver Secondary Dressing: Wound #1 Right Calcaneus: Gauze and Kerlix/Conform - netting Dressing Change Frequency: Wound #1 Right Calcaneus: Other: - Please leave dressing intact until Thursdays appointment Follow-up Appointments: Wound #1 Right Calcaneus: Return Appointment in 1 week. Off-Loading: Wound #1 Right Calcaneus: Heel suspension boot to: - [employer] Additional Orders / Instructions: Wound #1 Right Calcaneus: Increase protein intake. Activity as tolerated Other: - Include these vitamins in your diet, MVI, ZINC, Vit C, Vit A Home Health: Wound #1 Right Calcaneus: MARIJAYNE, RAUTH ([id number]) Continue Home Health Visits - [facility] Nurse may visit PRN to address patient s wound care needs. FACE TO FACE ENCOUNTER: MEDICARE and MEDICAID PATIENTS: I certify that this patient is under my care and that I had a face-to-face encounter that meets the physician face-to-face encounter requirements with this patient on this date. The encounter with the patient was in whole or in part for the following MEDICAL CONDITION: (primary reason for Home Healthcare) MEDICAL NECESSITY: I certify, that based on my findings, NURSING services are a medically necessary home health service. HOME BOUND STATUS: I certify that my clinical findings support that this patient is homebound (i.e., Due  to illness or injury, pt requires aid of supportive devices such as crutches, cane, wheelchairs, walkers, the use of special transportation or the assistance of another person to leave their place of residence. There is a normal inability to leave the home and doing so requires considerable and taxing effort. Other absences are for medical reasons / religious services and are infrequent or of short duration when for other reasons). If current dressing causes regression in wound condition, may D/C ordered dressing product/s and apply Normal Saline Moist Dressing daily until next Wound Healing Center / Other MD appointment. Notify Wound Healing Center of regression in wound condition at [phone]. Please direct any NON-WOUND related issues/requests for orders to patient's Primary Care Physician the wound looks excellent today and in fact though we had got a 16 mm disc of Grafix we did not use it. I have recommended we use some PolyMem foam over this heel to protect it, rapid lightly  and keep the dressing in place for the week. I anticipate discharge by next week. Electronic Signature(s) Signed: 01/01/2016 4:42:06 PM By: Evlyn KannerBritto, Camari Quintanilla MD, FACS Previous Signature: 12/25/2015 4:26:53 PM Version By: Evlyn KannerBritto, Samarth Ogle MD, FACS Entered By: Evlyn KannerBritto, Pierre Cumpton on 01/01/2016 16:42:06 Colleen QualeISLEY, Korrie L. ([id number]) -------------------------------------------------------------------------------- SuperBill Details Patient Name: Colleen QualeISLEY, Marticia L. Date of Service: 12/25/2015 Medical Record Number: [id number] Patient Account Number: [id number] Date of Birth/Sex: 10/30/1924 (82 y.o. Female) Treating RN: Curtis Sitesorthy, Joanna Primary Care Physician: Rolm GalaGrandis, Heidi Other Clinician: Referring Physician: Rolm GalaGrandis, Heidi Treating Physician/Extender: Rudene ReBritto, Gabriele Zwilling Weeks in Treatment: 14 Diagnosis Coding ICD-10 Codes Code Description [phone] Pressure ulcer of right heel, stage 3 E44.1 Mild protein-calorie  malnutrition Facility Procedures CPT4 Code: [id number] Description: 99213 - WOUND CARE VISIT-LEV 3 EST PT Modifier: Quantity: 1 Physician Procedures CPT4 Code: [id number] Description: 99213 - WC PHYS LEVEL 3 - EST PT ICD-10 Description Diagnosis L89.613 Pressure ulcer of right heel, stage 3 E44.1 Mild protein-calorie malnutrition Modifier: Quantity: 1 Electronic Signature(s) Signed: 12/25/2015 5:19:36 PM By: Curtis Sitesorthy, Joanna Signed: 12/27/2015 5:40:26 PM By: Evlyn KannerBritto, Quatavious Rossa MD, FACS Previous Signature: 12/25/2015 4:27:06 PM Version By: Evlyn KannerBritto, Oval Moralez MD, FACS Entered By: Curtis Sitesorthy, Joanna on 12/25/2015 17:19:36

## 2016-01-04 ENCOUNTER — Encounter: Payer: Medicare Other | Admitting: Nurse Practitioner

## 2016-01-04 DIAGNOSIS — L89613 Pressure ulcer of right heel, stage 3: Secondary | ICD-10-CM | POA: Diagnosis not present

## 2016-01-05 NOTE — Progress Notes (Signed)
TIMERA, WINDT ([id number]) Visit Report for 01/04/2016 Arrival Information Details Patient Name: [name], [name] Date of Service: 01/04/2016 12:30 PM Medical Record Number: [id number] Patient Account Number: [id number] Date of Birth/Sex: [birth year] (82 y.o. Female) Treating RN: Huel Coventry Primary Care Physician: Rolm Gala Other Clinician: Referring Physician: Rolm Gala Treating Physician/Extender: Kathreen Cosier in Treatment: 16 Visit Information History Since Last Visit Added or deleted any medications: No Patient Arrived: Ambulatory Any new allergies or adverse reactions: No Arrival Time: 12:41 Had a fall or experienced change in No Accompanied By: caregiver activities of daily living that may affect Transfer Assistance: None risk of falls: Patient Identification Verified: Yes Signs or symptoms of abuse/neglect since last No Secondary Verification Process Yes visito Completed: Hospitalized since last visit: No Patient Requires Transmission- No Has Dressing in Place as Prescribed: Yes Based Precautions: Pain Present Now: No Patient Has Alerts: Yes Patient Alerts: Patient on Blood Thinner eliquis Electronic Signature(s) Signed: 01/04/2016 4:40:34 PM By: Elliot Gurney, RN, BSN, Kim RN, BSN Entered By: Elliot Gurney, RN, BSN, Kim on 01/04/2016 12:41:25 [name] ([id number]) -------------------------------------------------------------------------------- Clinic Level of Care Assessment Details Patient Name: [name] Date of Service: 01/04/2016 12:30 PM Medical Record Number: [id number] Patient Account Number: [id number] Date of Birth/Sex: 21-Dec-1924 (82 y.o. Female) Treating RN: Huel Coventry Primary Care Physician: Rolm Gala Other Clinician: Referring Physician: Rolm Gala Treating Physician/Extender: Kathreen Cosier in Treatment: 16 Clinic Level of Care Assessment Items TOOL 4 Quantity Score []  - Use when only an EandM is performed on  FOLLOW-UP visit 0 ASSESSMENTS - Nursing Assessment / Reassessment []  - Reassessment of Co-morbidities (includes updates in patient status) 0 X - Reassessment of Adherence to Treatment Plan 1 5 ASSESSMENTS - Wound and Skin Assessment / Reassessment X - Simple Wound Assessment / Reassessment - one wound 1 5 []  - Complex Wound Assessment / Reassessment - multiple wounds 0 []  - Dermatologic / Skin Assessment (not related to wound area) 0 ASSESSMENTS - Focused Assessment []  - Circumferential Edema Measurements - multi extremities 0 []  - Nutritional Assessment / Counseling / Intervention 0 []  - Lower Extremity Assessment (monofilament, tuning fork, pulses) 0 []  - Peripheral Arterial Disease Assessment (using hand held doppler) 0 ASSESSMENTS - Ostomy and/or Continence Assessment and Care []  - Incontinence Assessment and Management 0 []  - Ostomy Care Assessment and Management (repouching, etc.) 0 PROCESS - Coordination of Care X - Simple Patient / Family Education for ongoing care 1 15 []  - Complex (extensive) Patient / Family Education for ongoing care 0 X - Staff obtains Chiropractor, Records, Test Results / Process Orders 1 10 []  - Staff telephones HHA, Nursing Homes / Clarify orders / etc 0 []  - Routine Transfer to another Facility (non-emergent condition) 0 Petro, Lariza L. ([id number]) []  - Routine Hospital Admission (non-emergent condition) 0 []  - New Admissions / Manufacturing engineer / Ordering NPWT, Apligraf, etc. 0 []  - Emergency Hospital Admission (emergent condition) 0 X - Simple Discharge Coordination 1 10 []  - Complex (extensive) Discharge Coordination 0 PROCESS - Special Needs []  - Pediatric / Minor Patient Management 0 []  - Isolation Patient Management 0 []  - Hearing / Language / Visual special needs 0 []  - Assessment of Community assistance (transportation, D/C planning, etc.) 0 []  - Additional assistance / Altered mentation 0 []  - Support Surface(s) Assessment (bed,  cushion, seat, etc.) 0 INTERVENTIONS - Wound Cleansing / Measurement X - Simple Wound Cleansing - one wound 1 5 []  - Complex Wound Cleansing - multiple wounds 0 X -  Wound Imaging (photographs - any number of wounds) 1 5 []  - Wound Tracing (instead of photographs) 0 X - Simple Wound Measurement - one wound 1 5 []  - Complex Wound Measurement - multiple wounds 0 INTERVENTIONS - Wound Dressings X - Small Wound Dressing one or multiple wounds 1 10 []  - Medium Wound Dressing one or multiple wounds 0 []  - Large Wound Dressing one or multiple wounds 0 []  - Application of Medications - topical 0 []  - Application of Medications - injection 0 INTERVENTIONS - Miscellaneous []  - External ear exam 0 Mabee, Shila L. ([id number]) []  - Specimen Collection (cultures, biopsies, blood, body fluids, etc.) 0 []  - Specimen(s) / Culture(s) sent or taken to Lab for analysis 0 []  - Patient Transfer (multiple staff / Michiel Sites Lift / Similar devices) 0 []  - Simple Staple / Suture removal (25 or less) 0 []  - Complex Staple / Suture removal (26 or more) 0 []  - Hypo / Hyperglycemic Management (close monitor of Blood Glucose) 0 []  - Ankle / Brachial Index (ABI) - do not check if billed separately 0 X - Vital Signs 1 5 Has the patient been seen at the hospital within the last three years: Yes Total Score: 75 Level Of Care: New/Established - Level 2 Electronic Signature(s) Signed: 01/04/2016 4:40:34 PM By: Elliot Gurney, RN, BSN, Kim RN, BSN Entered By: Elliot Gurney, RN, BSN, Kim on 01/04/2016 13:12:49 [name] ([id number]) -------------------------------------------------------------------------------- Encounter Discharge Information Details Patient Name: [name] Date of Service: 01/04/2016 12:30 PM Medical Record Number: [id number] Patient Account Number: [id number] Date of Birth/Sex: [birth year] (82 y.o. Female) Treating RN: Huel Coventry Primary Care Physician: Rolm Gala Other Clinician: Referring  Physician: Rolm Gala Treating Physician/Extender: Kathreen Cosier in Treatment: 51 Encounter Discharge Information Items Discharge Pain Level: 0 Discharge Condition: Stable Ambulatory Status: Ambulatory Discharge Destination: Home Private Transportation: Auto Accompanied By: caregivers Schedule Follow-up Appointment: Yes Medication Reconciliation completed and Yes provided to Patient/Care Jaelah Hauth: Clinical Summary of Care: Electronic Signature(s) Signed: 01/04/2016 4:40:34 PM By: Elliot Gurney, RN, BSN, Kim RN, BSN Entered By: Elliot Gurney, RN, BSN, Kim on 01/04/2016 13:13:42 [name] ([id number]) -------------------------------------------------------------------------------- Lower Extremity Assessment Details Patient Name: [name] Date of Service: 01/04/2016 12:30 PM Medical Record Number: [id number] Patient Account Number: [id number] Date of Birth/Sex: 07/08/1924 (82 y.o. Female) Treating RN: Huel Coventry Primary Care Physician: Rolm Gala Other Clinician: Referring Physician: Rolm Gala Treating Physician/Extender: Kathreen Cosier in Treatment: 16 Vascular Assessment Pulses: Posterior Tibial Dorsalis Pedis Palpable: [Right:Yes] Extremity colors, hair growth, and conditions: Extremity Color: [Right:Normal] Hair Growth on Extremity: [Right:No] Temperature of Extremity: [Right:Warm] Capillary Refill: [Right:< 3 seconds] Dependent Rubor: [Right:No] Blanched when Elevated: [Right:No] Lipodermatosclerosis: [Right:No] Toe Nail Assessment Left: Right: Thick: No Discolored: Yes Deformed: No Improper Length and Hygiene: No Electronic Signature(s) Signed: 01/04/2016 4:40:34 PM By: Elliot Gurney, RN, BSN, Kim RN, BSN Entered By: Elliot Gurney, RN, BSN, Kim on 01/04/2016 12:45:58 [name] ([id number]) -------------------------------------------------------------------------------- Multi Wound Chart Details Patient Name: [name] Date of Service:  01/04/2016 12:30 PM Medical Record Number: [id number] Patient Account Number: [id number] Date of Birth/Sex: [birth year] (82 y.o. Female) Treating RN: Huel Coventry Primary Care Physician: Rolm Gala Other Clinician: Referring Physician: Rolm Gala Treating Physician/Extender: Kathreen Cosier in Treatment: 16 Vital Signs Height(in): 59 Pulse(bpm): 68 Weight(lbs): 108 Blood Pressure 142/72 (mmHg): Body Mass Index(BMI): 22 Temperature(F): 97.4 Respiratory Rate 16 (breaths/min): Photos: [N/A:N/A] Wound Location: Right Calcaneus N/A N/A Wounding Event: Pressure Injury N/A N/A Primary Etiology: Pressure Ulcer N/A N/A Comorbid History:  Deep Vein Thrombosis, N/A N/A Hypertension, Dementia Date Acquired: 07/14/2015 N/A N/A Weeks of Treatment: 16 N/A N/A Wound Status: Open N/A N/A Measurements L x W x D 0.3x0.5x0.1 N/A N/A (cm) Area (cm) : 0.118 N/A N/A Volume (cm) : 0.012 N/A N/A % Reduction in Area: 97.90% N/A N/A % Reduction in Volume: 98.90% N/A N/A Classification: Category/Stage III N/A N/A Exudate Amount: None Present N/A N/A Wound Margin: Flat and Intact N/A N/A Granulation Amount: Large (67-100%) N/A N/A Granulation Quality: Pink N/A N/A Necrotic Amount: None Present (0%) N/A N/A Exposed Structures: Fascia: No N/A N/A Fat: No Tendon: No Muscle: No Lukacs, Tommye L. ([id number]) Joint: No Bone: No Limited to Skin Breakdown Epithelialization: Large (67-100%) N/A N/A Periwound Skin Texture: Edema: No N/A N/A Excoriation: No Induration: No Callus: No Crepitus: No Fluctuance: No Friable: No Rash: No Scarring: No Periwound Skin Dry/Scaly: Yes N/A N/A Moisture: Maceration: No Moist: No Periwound Skin Color: Atrophie Blanche: No N/A N/A Cyanosis: No Ecchymosis: No Erythema: No Hemosiderin Staining: No Mottled: No Pallor: No Rubor: No Temperature: No Abnormality N/A N/A Tenderness on Yes N/A N/A Palpation: Wound Preparation: Ulcer Cleansing:  N/A N/A Rinsed/Irrigated with Saline Topical Anesthetic Applied: Other: lidocaine 4% Treatment Notes Electronic Signature(s) Signed: 01/04/2016 4:40:34 PM By: Elliot GurneyWoody, RN, BSN, Kim RN, BSN Entered By: Elliot GurneyWoody, RN, BSN, Kim on 01/04/2016 13:06:36 Colleen QualeISLEY, Keeleigh L. ([id number]) -------------------------------------------------------------------------------- Multi-Disciplinary Care Plan Details Patient Name: Colleen QualeISLEY, Yoshie L. Date of Service: 01/04/2016 12:30 PM Medical Record Number: [id number] Patient Account Number: [id number] Date of Birth/Sex: 05/30/1924 (82 y.o. Female) Treating RN: Huel CoventryWoody, Kim Primary Care Physician: Rolm GalaGrandis, Heidi Other Clinician: Referring Physician: Rolm GalaGrandis, Heidi Treating Physician/Extender: Kathreen Cosieroulter, Leah Weeks in Treatment: 8116 Active Inactive Abuse / Safety / Falls / Self Care Management Nursing Diagnoses: Impaired home maintenance Impaired physical mobility Potential for falls Self care deficit: actual or potential Goals: Patient will remain injury free Date Initiated: 09/14/2015 Goal Status: Active Patient/caregiver will verbalize understanding of skin care regimen Date Initiated: 09/14/2015 Goal Status: Active Patient/caregiver will verbalize/demonstrate measure taken to improve self care Date Initiated: 09/14/2015 Goal Status: Active Patient/caregiver will verbalize/demonstrate measures taken to improve the patient's personal safety Date Initiated: 09/14/2015 Goal Status: Active Patient/caregiver will verbalize/demonstrate measures taken to prevent injury and/or falls Date Initiated: 09/14/2015 Goal Status: Active Patient/caregiver will verbalize/demonstrate understanding of what to do in case of emergency Date Initiated: 09/14/2015 Goal Status: Active Interventions: Assess fall risk on admission and as needed Assess: immobility, friction, shearing, incontinence upon admission and as needed Assess impairment of mobility on admission and as needed per  policy Assess self care needs on admission and as needed Provide education on basic hygiene Provide education on personal and home safety Colleen QualeSLEY, Kendalynn L. ([id number]) Provide education on safe transfers Notes: Orientation to the Wound Care Program Nursing Diagnoses: Knowledge deficit related to the wound healing center program Goals: Patient/caregiver will verbalize understanding of the Wound Healing Center Program Date Initiated: 09/14/2015 Goal Status: Active Interventions: Provide education on orientation to the wound center Notes: Pressure Nursing Diagnoses: Knowledge deficit related to causes and risk factors for pressure ulcer development Knowledge deficit related to management of pressures ulcers Potential for impaired tissue integrity related to pressure, friction, moisture, and shear Goals: Patient will remain free from development of additional pressure ulcers Date Initiated: 09/14/2015 Goal Status: Active Patient will remain free of pressure ulcers Date Initiated: 09/14/2015 Goal Status: Active Patient/caregiver will verbalize risk factors for pressure ulcer development Date Initiated: 09/14/2015 Goal Status: Active Patient/caregiver will verbalize understanding  of pressure ulcer management Date Initiated: 09/14/2015 Goal Status: Active Interventions: Assess: immobility, friction, shearing, incontinence upon admission and as needed Assess offloading mechanisms upon admission and as needed Assess potential for pressure ulcer upon admission and as needed Provide education on pressure ulcers Treatment Activities: AVANI, SENSABAUGH ([id number]) Patient referred for pressure reduction/relief devices : 09/14/2015 Patient referred for seating evaluation to ensure proper offloading : 09/14/2015 Pressure reduction/relief device ordered : 09/14/2015 Notes: Wound/Skin Impairment Nursing Diagnoses: Impaired tissue integrity Knowledge deficit related to ulceration/compromised  skin integrity Goals: Patient/caregiver will verbalize understanding of skin care regimen Date Initiated: 09/14/2015 Goal Status: Active Ulcer/skin breakdown will have a volume reduction of 30% by week 4 Date Initiated: 09/14/2015 Goal Status: Active Ulcer/skin breakdown will have a volume reduction of 50% by week 8 Date Initiated: 09/14/2015 Goal Status: Active Ulcer/skin breakdown will have a volume reduction of 80% by week 12 Date Initiated: 09/14/2015 Goal Status: Active Ulcer/skin breakdown will heal within 14 weeks Date Initiated: 09/14/2015 Goal Status: Active Interventions: Assess patient/caregiver ability to obtain necessary supplies Assess patient/caregiver ability to perform ulcer/skin care regimen upon admission and as needed Assess ulceration(s) every visit Provide education on ulcer and skin care Treatment Activities: Referred to DME Shalea Tomczak for dressing supplies : 09/14/2015 Skin care regimen initiated : 09/14/2015 Topical wound management initiated : 09/14/2015 Notes: Electronic Signature(s) Signed: 01/04/2016 4:40:34 PM By: Elliot Gurney, RN, BSN, Kim RN, BSN Entered By: Elliot Gurney, RN, BSN, Kim on 01/04/2016 13:06:30 [name] ([id number] Chavez ([id number]) -------------------------------------------------------------------------------- Pain Assessment Details Patient Name: [name] Date of Service: 01/04/2016 12:30 PM Medical Record Number: [id number] Patient Account Number: [id number] Date of Birth/Sex: 01/05/1925 (82 y.o. Female) Treating RN: Huel Coventry Primary Care Physician: Rolm Gala Other Clinician: Referring Physician: Rolm Gala Treating Physician/Extender: Kathreen Cosier in Treatment: 16 Active Problems Location of Pain Severity and Description of Pain Patient Has Paino No Site Locations With Dressing Change: No Pain Management and Medication Current Pain Management: Notes Topical or injectable lidocaine is offered to  patient for acute pain when surgical debridement is performed. If needed, Patient is instructed to use over the counter pain medication for the following 24-48 hours after debridement. Wound care MDs do not prescribed pain medications. Patient has chronic pain or uncontrolled pain. Patient has been instructed to make an appointment with their Primary Care Physician for pain management. Electronic Signature(s) Signed: 01/04/2016 4:40:34 PM By: Elliot Gurney, RN, BSN, Kim RN, BSN Entered By: Elliot Gurney, RN, BSN, Kim on 01/04/2016 12:41:39 [name] ([id number]) -------------------------------------------------------------------------------- Patient/Caregiver Education Details Patient Name: [name] Date of Service: 01/04/2016 12:30 PM Medical Record Number: [id number] Patient Account Number: [id number] Date of Birth/Gender: [birth year] (82 y.o. Female) Treating RN: Huel Coventry Primary Care Physician: Rolm Gala Other Clinician: Referring Physician: Rolm Gala Treating Physician/Extender: Kathreen Cosier in Treatment: 16 Education Assessment Education Provided To: Patient Education Topics Provided Wound/Skin Impairment: Handouts: Caring for Your Ulcer Methods: Demonstration, Explain/Verbal Responses: State content correctly Electronic Signature(s) Signed: 01/04/2016 4:40:34 PM By: Elliot Gurney, RN, BSN, Kim RN, BSN Entered By: Elliot Gurney, RN, BSN, Kim on 01/04/2016 13:13:54 [name] ([id number]) -------------------------------------------------------------------------------- Wound Assessment Details Patient Name: [name] Date of Service: 01/04/2016 12:30 PM Medical Record Number: [id number] Patient Account Number: [id number] Date of Birth/Sex: 08-27-1924 (82 y.o. Female) Treating RN: Huel Coventry Primary Care Physician: Rolm Gala Other Clinician: Referring Physician: Rolm Gala Treating Physician/Extender: Bonnell Public Weeks in Treatment: 16 Wound  Status Wound Number: 1 Primary Pressure Ulcer Etiology: Wound Location:  Right Calcaneus Wound Status: Open Wounding Event: Pressure Injury Comorbid Deep Vein Thrombosis, Date Acquired: 07/14/2015 History: Hypertension, Dementia Weeks Of Treatment: 16 Clustered Wound: No Photos Wound Measurements Length: (cm) 0.3 Width: (cm) 0.5 Depth: (cm) 0.1 Area: (cm) 0.118 Volume: (cm) 0.012 % Reduction in Area: 97.9% % Reduction in Volume: 98.9% Epithelialization: Large (67-100%) Tunneling: No Undermining: No Wound Description Classification: Category/Stage III Wound Margin: Flat and Intact Exudate Amount: None Present Foul Odor After Cleansing: No Wound Bed Granulation Amount: Large (67-100%) Exposed Structure Granulation Quality: Pink Fascia Exposed: No Necrotic Amount: None Present (0%) Fat Layer Exposed: No Tendon Exposed: No Muscle Exposed: No Joint Exposed: No Bone Exposed: No Limited to Skin Breakdown Periwound Skin Texture Luiz, Nataliee L. ([id number]) Texture Color No Abnormalities Noted: No No Abnormalities Noted: No Callus: No Atrophie Blanche: No Crepitus: No Cyanosis: No Excoriation: No Ecchymosis: No Fluctuance: No Erythema: No Friable: No Hemosiderin Staining: No Induration: No Mottled: No Localized Edema: No Pallor: No Rash: No Rubor: No Scarring: No Temperature / Pain Moisture Temperature: No Abnormality No Abnormalities Noted: No Tenderness on Palpation: Yes Dry / Scaly: Yes Maceration: No Moist: No Wound Preparation Ulcer Cleansing: Rinsed/Irrigated with Saline Topical Anesthetic Applied: Other: lidocaine 4%, Treatment Notes Wound #1 (Right Calcaneus) 1. Cleansed with: Clean wound with Normal Saline 2. Anesthetic Topical Lidocaine 4% cream to wound bed prior to debridement 4. Dressing Applied: Other dressing (specify in notes) 5. Secondary Dressing Applied Gauze and Kerlix/Conform Notes polymem with silver Electronic  Signature(s) Signed: 01/04/2016 4:40:34 PM By: Elliot GurneyWoody, RN, BSN, Kim RN, BSN Entered By: Elliot GurneyWoody, RN, BSN, Kim on 01/04/2016 13:06:21 Colleen QualeISLEY, Estephany L. ([id number]) -------------------------------------------------------------------------------- Vitals Details Patient Name: Colleen QualeISLEY, Francille L. Date of Service: 01/04/2016 12:30 PM Medical Record Number: [id number] Patient Account Number: [id number] Date of Birth/Sex: 04/03/1924 (82 y.o. Female) Treating RN: Huel CoventryWoody, Kim Primary Care Physician: Rolm GalaGrandis, Heidi Other Clinician: Referring Physician: Rolm GalaGrandis, Heidi Treating Physician/Extender: Kathreen Cosieroulter, Leah Weeks in Treatment: 16 Vital Signs Time Taken: 12:41 Temperature (F): 97.4 Height (in): 59 Pulse (bpm): 68 Weight (lbs): 108 Respiratory Rate (breaths/min): 16 Body Mass Index (BMI): 21.8 Blood Pressure (mmHg): 142/72 Reference Range: 80 - 120 mg / dl Electronic Signature(s) Signed: 01/04/2016 4:40:34 PM By: Elliot GurneyWoody, RN, BSN, Kim RN, BSN Entered By: Elliot GurneyWoody, RN, BSN, Kim on 01/04/2016 12:41:58

## 2016-01-06 NOTE — Progress Notes (Signed)
IVANELL, DESHOTEL ([id number]) Visit Report for 01/04/2016 Chief Complaint Document Details Patient Name: [name], [name] 01/04/2016 12:30 Date of Service: PM Medical Record [id number] Number: Patient Account Number: [id number] Aug 27, 1924 (82 y.o. Treating RN: Curtis Sites Date of Birth/Sex: Female) Other Clinician: Primary Care Physician: Rolm Gala Treating Homar Weinkauf Referring Physician: Rolm Gala Physician/Extender: Weeks in Treatment: 16 Information Obtained from: Patient Chief Complaint patient presents to the clinic for evaluation of right heel stage III pressure ulcer Electronic Signature(s) Signed: 01/04/2016 1:26:07 PM By: Penne Lash, NP, Sashia Campas Entered By: Penne Lash, NP, Cleora Karnik on 01/04/2016 13:26:07 [name] ([id number]) -------------------------------------------------------------------------------- HPI Details Patient Name: [name] 01/04/2016 12:30 Date of Service: PM Medical Record [id number] Number: Patient Account Number: [id number] Mar 14, 1924 (82 y.o. Treating RN: Curtis Sites Date of Birth/Sex: Female) Other Clinician: Primary Care Physician: Rolm Gala Treating Daira Hine Referring Physician: Rolm Gala Physician/Extender: Weeks in Treatment: 16 History of Present Illness Severity: . HPI Description: 82 year old patient has been referred to as by her PCP for a pressure ulcer of the right heel. his had this for about 8 weeks Has been treated with duoderm. Has been there for a while it's getting more painful and larger and she also was seen for a recent urinary tract infection. She was given ciprofloxacin for this and then changed on to nitrofurantoin. She has a past medical history of rheumatic fever, hyperlipidemia, hypertension, scoliosis and osteopenia. never been a smoker. 10/05/2015 -- x-ray of the right heel -- IMPRESSION:No osseous finding of significance. Abnormal appearance of the soft tissues posterior to the  heel which could be due to infection or trauma. I do not see an intact Achilles tendon shadow. 12/07/2015 -- the patient has been approved for grafix and we will get this ready for her next week. 12/14/2015 -- she is here for her first application of Grafix. 12/21/2015 - the cause of the holiday we will change her application of Grafix to Monday and will order 16mm size for this day 01-04-16 Ms. Regnier presents today with family for evaluation of her right heel stage III pressure ulcer. She nor the family admits to any complications or concerns since her last appointment. They state that she has been wearing the offloading boot while in bed and in the recliner. They have been using PolyMem. Electronic Signature(s) Signed: 01/04/2016 1:39:25 PM By: Penne Lash, NP, Reggie Pile By: Penne Lash, NP, Joon Pohle on 01/04/2016 13:39:25 SHAMON, LOBO ([id number]) -------------------------------------------------------------------------------- Physical Exam Details Patient Name: [name], [name] 01/04/2016 12:30 Date of Service: PM Medical Record [id number] Number: Patient Account Number: [id number] [birth year] (82 y.o. Treating RN: Curtis Sites Date of Birth/Sex: Female) Other Clinician: Primary Care Physician: Rolm Gala Treating Giulietta Prokop Referring Physician: Rolm Gala Physician/Extender: Weeks in Treatment: 16 Constitutional BP within normal limits. afebrile. well nourished; well developed; appears stated age;Marland Kitchen Respiratory non-labored respiratory effort. Cardiovascular palpable DP, non-palpable PT. Musculoskeletal ambulates with walker. Integumentary (Hair, Skin) ulcerated area covered with dried drainage this was removed to reveal a very superficial non-granulating red base; no peri-ulcer erythema. no induration, no fluctuance, denies pain upon palpation. Psychiatric oriented to time, place, person and situation. calm, pleasant. Electronic Signature(s) Signed: 01/04/2016  1:40:57 PM By: Penne Lash, NP, Reggie Pile By: Penne Lash, NP, Jef Futch on 01/04/2016 13:40:56 [name] ([id number]) -------------------------------------------------------------------------------- Physician Orders Details Patient Name: HEAVENLY, CHRISTINE 01/04/2016 12:30 Date of Service: PM Medical Record [id number] Number: Patient Account Number: [id number] 1924/11/12 (82 y.o. Treating RN: Huel Coventry Date of Birth/Sex: Female) Other Clinician: Primary Care Physician: Rolm Gala  Treating Jiyan Walkowski Referring Physician: Rolm Gala Physician/Extender: Tania Ade in Treatment: 65 Verbal / Phone Orders: Yes Clinician: Huel Coventry Read Back and Verified: Yes Diagnosis Coding Wound Cleansing Wound #1 Right Calcaneus o Cleanse wound with mild soap and water o May Shower, gently pat wound dry prior to applying new dressing. o May shower with protection. Anesthetic Wound #1 Right Calcaneus o Topical Lidocaine 4% cream applied to wound bed prior to debridement - In Clinic Primary Wound Dressing Wound #1 Right Calcaneus o Other: - polymem with silver Secondary Dressing Wound #1 Right Calcaneus o Gauze and Kerlix/Conform - netting Dressing Change Frequency Wound #1 Right Calcaneus o Three times weekly - Once in wound center. Follow-up Appointments Wound #1 Right Calcaneus o Return Appointment in 1 week. Off-Loading Wound #1 Right Calcaneus o Heel suspension boot to: - [employer] Additional Orders / Instructions Wound #1 Right Calcaneus o Increase protein intake. DANEISHA, SURGES ([id number]) o Activity as tolerated o Other: - Include these vitamins in your diet, MVI, ZINC, Vit C, Vit A Home Health Wound #1 Right Calcaneus o Continue Home Health Visits - [facility] o Home Health Nurse may visit PRN to address patientos wound care needs. o FACE TO FACE ENCOUNTER: MEDICARE and MEDICAID PATIENTS: I certify that this patient is under my care and  that I had a face-to-face encounter that meets the physician face-to-face encounter requirements with this patient on this date. The encounter with the patient was in whole or in part for the following MEDICAL CONDITION: (primary reason for Home Healthcare) MEDICAL NECESSITY: I certify, that based on my findings, NURSING services are a medically necessary home health service. HOME BOUND STATUS: I certify that my clinical findings support that this patient is homebound (i.e., Due to illness or injury, pt requires aid of supportive devices such as crutches, cane, wheelchairs, walkers, the use of special transportation or the assistance of another person to leave their place of residence. There is a normal inability to leave the home and doing so requires considerable and taxing effort. Other absences are for medical reasons / religious services and are infrequent or of short duration when for other reasons). o If current dressing causes regression in wound condition, may D/C ordered dressing product/s and apply Normal Saline Moist Dressing daily until next Wound Healing Center / Other MD appointment. Notify Wound Healing Center of regression in wound condition at [phone]. o Please direct any NON-WOUND related issues/requests for orders to patient's Primary Care Physician Electronic Signature(s) Signed: 01/04/2016 4:40:34 PM By: Elliot Gurney RN, BSN, Kim RN, BSN Signed: 01/05/2016 8:33:28 AM By: Penne Lash, NP, Juanito Gonyer Entered By: Elliot Gurney RN, BSN, Kim on 01/04/2016 13:20:21 DESHAUNA, CAYSON ([id number]) -------------------------------------------------------------------------------- Problem List Details Patient Name: [name], [name] 01/04/2016 12:30 Date of Service: PM Medical Record [id number] Number: Patient Account Number: [id number] [birth year] (82 y.o. Treating RN: Curtis Sites Date of Birth/Sex: Female) Other Clinician: Primary Care Physician: Rolm Gala Treating Jaevion Goto,  Andrian Urbach Referring Physician: Rolm Gala Physician/Extender: Weeks in Treatment: 16 Active Problems ICD-10 Encounter Code Description Active Date Diagnosis L89.613 Pressure ulcer of right heel, stage 3 09/14/2015 Yes E44.1 Mild protein-calorie malnutrition 09/14/2015 Yes Inactive Problems Resolved Problems Electronic Signature(s) Signed: 01/04/2016 1:25:46 PM By: Penne Lash, NP, Reggie Pile By: Penne Lash, NP, Socorro Kanitz on 01/04/2016 13:25:46 Stecher, Nivea Elbert Ewings ([id number]) -------------------------------------------------------------------------------- Progress Note Details Patient Name: [name] 01/04/2016 12:30 Date of Service: PM Medical Record [id number] Number: Patient Account Number: [id number] 1925-01-12 (82 y.o. Treating RN: Curtis Sites Date of Birth/Sex: Female) Other Clinician:  Primary Care Physician: Rolm GalaGrandis, Heidi Treating Neamiah Sciarra Referring Physician: Rolm GalaGrandis, Heidi Physician/Extender: Tania AdeWeeks in Treatment: 16 Subjective Chief Complaint Information obtained from Patient patient presents to the clinic for evaluation of right heel stage III pressure ulcer History of Present Illness (HPI) The following HPI elements were documented for the patient's wound: Severity: . 82 year old patient has been referred to as by her PCP for a pressure ulcer of the right heel. his had this for about 8 weeks Has been treated with duoderm. Has been there for a while it's getting more painful and larger and she also was seen for a recent urinary tract infection. She was given ciprofloxacin for this and then changed on to nitrofurantoin. She has a past medical history of rheumatic fever, hyperlipidemia, hypertension, scoliosis and osteopenia. never been a smoker. 10/05/2015 -- x-ray of the right heel -- IMPRESSION:No osseous finding of significance. Abnormal appearance of the soft tissues posterior to the heel which could be due to infection or trauma. I do not see an intact  Achilles tendon shadow. 12/07/2015 -- the patient has been approved for grafix and we will get this ready for her next week. 12/14/2015 -- she is here for her first application of Grafix. 12/21/2015 - the cause of the holiday we will change her application of Grafix to Monday and will order 16mm size for this day 01-04-16 Ms. Ernest Mallicksley presents today with family for evaluation of her right heel stage III pressure ulcer. She nor the family admits to any complications or concerns since her last appointment. They state that she has been wearing the offloading boot while in bed and in the recliner. They have been using PolyMem. Objective Constitutional Scalia, Yulitza L. ([id number]) BP within normal limits. afebrile. well nourished; well developed; appears stated age;Marland Kitchen. Vitals Time Taken: 12:41 PM, Height: 59 in, Weight: 108 lbs, BMI: 21.8, Temperature: 97.4 F, Pulse: 68 bpm, Respiratory Rate: 16 breaths/min, Blood Pressure: 142/72 mmHg. Respiratory non-labored respiratory effort. Cardiovascular palpable DP, non-palpable PT. Musculoskeletal ambulates with walker. Psychiatric oriented to time, place, person and situation. calm, pleasant. Integumentary (Hair, Skin) ulcerated area covered with dried drainage this was removed to reveal a very superficial non-granulating red base; no peri-ulcer erythema. no induration, no fluctuance, denies pain upon palpation. Wound #1 status is Open. Original cause of wound was Pressure Injury. The wound is located on the Right Calcaneus. The wound measures 0.3cm length x 0.5cm width x 0.1cm depth; 0.118cm^2 area and 0.012cm^3 volume. The wound is limited to skin breakdown. There is no tunneling or undermining noted. There is a none present amount of drainage noted. The wound margin is flat and intact. There is large (67- 100%) pink granulation within the wound bed. There is no necrotic tissue within the wound bed. The periwound skin appearance exhibited:  Dry/Scaly. The periwound skin appearance did not exhibit: Callus, Crepitus, Excoriation, Fluctuance, Friable, Induration, Localized Edema, Rash, Scarring, Maceration, Moist, Atrophie Blanche, Cyanosis, Ecchymosis, Hemosiderin Staining, Mottled, Pallor, Rubor, Erythema. Periwound temperature was noted as No Abnormality. The periwound has tenderness on palpation. Assessment Active Problems ICD-10 L89.613 - Pressure ulcer of right heel, stage 3 E44.1 - Mild protein-calorie malnutrition Maisel, Tashawna L. ([id number]) Plan Wound Cleansing: Wound #1 Right Calcaneus: Cleanse wound with mild soap and water May Shower, gently pat wound dry prior to applying new dressing. May shower with protection. Anesthetic: Wound #1 Right Calcaneus: Topical Lidocaine 4% cream applied to wound bed prior to debridement - In Clinic Primary Wound Dressing: Wound #1 Right Calcaneus: Other: - polymem  with silver Secondary Dressing: Wound #1 Right Calcaneus: Gauze and Kerlix/Conform - netting Dressing Change Frequency: Wound #1 Right Calcaneus: Three times weekly - Once in wound center. Follow-up Appointments: Wound #1 Right Calcaneus: Return Appointment in 1 week. Off-Loading: Wound #1 Right Calcaneus: Heel suspension boot to: - [employer] Additional Orders / Instructions: Wound #1 Right Calcaneus: Increase protein intake. Activity as tolerated Other: - Include these vitamins in your diet, MVI, ZINC, Vit C, Vit A Home Health: Wound #1 Right Calcaneus: [facility] Nurse may visit PRN to address patient s wound care needs. FACE TO FACE ENCOUNTER: MEDICARE and MEDICAID PATIENTS: I certify that this patient is under my care and that I had a face-to-face encounter that meets the physician face-to-face encounter requirements with this patient on this date. The encounter with the patient was in whole or in part for the following MEDICAL CONDITION: (primary reason for  Home Healthcare) MEDICAL NECESSITY: I certify, that based on my findings, NURSING services are a medically necessary home health service. HOME BOUND STATUS: I certify that my clinical findings support that this patient is homebound (i.e., Due to illness or injury, pt requires aid of supportive devices such as crutches, cane, wheelchairs, walkers, the use of special transportation or the assistance of another person to leave their place of residence. There is a normal inability to leave the home and doing so requires considerable and taxing effort. Other absences are for medical reasons / religious services and are infrequent or of short duration when for other reasons). If current dressing causes regression in wound condition, may D/C ordered dressing product/s and apply Normal Saline Moist Dressing daily until next Wound Healing Center / Other MD appointment. Notify Wound Healing Center of regression in wound condition at [phone]. Please direct any NON-WOUND related issues/requests for orders to patient's Primary Care Physician Colleen QualeSLEY, Cheila L. ([id number]) Follow-Up Appointments: A follow-up appointment should be scheduled. Medication Reconciliation completed and provided to Patient/Care Provider. 1. Continue with PolyMem Ag 3 times weekly 2. Continue offloading right heel 3. Follow-up next week for evaluation Electronic Signature(s) Signed: 01/04/2016 1:42:03 PM By: Penne Lashoulter, NP, Tsering Leaman Entered By: Penne Lashoulter, NP, Lorelee Mclaurin on 01/04/2016 13:42:03 Colleen QualeISLEY, Nylia L. ([id number]) -------------------------------------------------------------------------------- SuperBill Details Patient Name: Colleen QualeISLEY, Chanci L. Date of Service: 01/04/2016 Medical Record Number: [id number] Patient Account Number: [id number] Date of Birth/Sex: 12/17/1924 (82 y.o. Female) Treating RN: Curtis Sitesorthy, Joanna Primary Care Physician: Rolm GalaGrandis, Heidi Other Clinician: Referring Physician: Rolm GalaGrandis, Heidi Treating  Physician/Extender: Kathreen Cosieroulter, Shadiamond Koska Weeks in Treatment: 16 Diagnosis Coding ICD-10 Codes Code Description [phone] Pressure ulcer of right heel, stage 3 E44.1 Mild protein-calorie malnutrition Facility Procedures CPT4 Code: [id number] Description: [id number] - WOUND CARE VISIT-LEV 2 EST PT Modifier: Quantity: 1 Physician Procedures CPT4 Code: [id number] Description: 99213 - WC PHYS LEVEL 3 - EST PT ICD-10 Description Diagnosis L89.613 Pressure ulcer of right heel, stage 3 Modifier: Quantity: 1 Electronic Signature(s) Signed: 01/04/2016 1:42:17 PM By: Penne Lashoulter, NP, Reggie PileLeah Entered By: Penne Lashoulter, NP, Laqueena Hinchey on 01/04/2016 13:42:16

## 2016-01-11 ENCOUNTER — Encounter: Payer: Medicare Other | Attending: Surgery | Admitting: Surgery

## 2016-01-11 DIAGNOSIS — M8580 Other specified disorders of bone density and structure, unspecified site: Secondary | ICD-10-CM | POA: Diagnosis not present

## 2016-01-11 DIAGNOSIS — F0390 Unspecified dementia without behavioral disturbance: Secondary | ICD-10-CM | POA: Diagnosis not present

## 2016-01-11 DIAGNOSIS — L89613 Pressure ulcer of right heel, stage 3: Secondary | ICD-10-CM | POA: Diagnosis present

## 2016-01-11 DIAGNOSIS — M419 Scoliosis, unspecified: Secondary | ICD-10-CM | POA: Insufficient documentation

## 2016-01-11 DIAGNOSIS — E441 Mild protein-calorie malnutrition: Secondary | ICD-10-CM | POA: Diagnosis not present

## 2016-01-11 DIAGNOSIS — Z880 Allergy status to penicillin: Secondary | ICD-10-CM | POA: Diagnosis not present

## 2016-01-11 DIAGNOSIS — Z7901 Long term (current) use of anticoagulants: Secondary | ICD-10-CM | POA: Insufficient documentation

## 2016-01-11 DIAGNOSIS — Z7982 Long term (current) use of aspirin: Secondary | ICD-10-CM | POA: Insufficient documentation

## 2016-01-11 DIAGNOSIS — E785 Hyperlipidemia, unspecified: Secondary | ICD-10-CM | POA: Diagnosis not present

## 2016-01-11 DIAGNOSIS — I10 Essential (primary) hypertension: Secondary | ICD-10-CM | POA: Insufficient documentation

## 2016-01-11 DIAGNOSIS — Z86718 Personal history of other venous thrombosis and embolism: Secondary | ICD-10-CM | POA: Diagnosis not present

## 2016-01-11 DIAGNOSIS — Z79899 Other long term (current) drug therapy: Secondary | ICD-10-CM | POA: Diagnosis not present

## 2016-01-12 NOTE — Progress Notes (Signed)
GITEL, BESTE ([id number]) Visit Report for 01/11/2016 Chief Complaint Document Details Patient Name: [name], [name] Date of Service: 01/11/2016 2:15 PM Medical Record Number: [id number] Patient Account Number: [id number] Date of Birth/Sex: [birth year] (82 y.o. Female) Treating RN: Phillis Haggis Primary Care Physician: Rolm Gala Other Clinician: Referring Physician: Rolm Gala Treating Physician/Extender: Rudene Re in Treatment: 17 Information Obtained from: Patient Chief Complaint patient presents to the clinic for evaluation of right heel stage III pressure ulcer Electronic Signature(s) Signed: 01/11/2016 2:47:26 PM By: Evlyn Kanner MD, FACS Entered By: Evlyn Kanner on 01/11/2016 14:47:26 [name] ([id number]) -------------------------------------------------------------------------------- HPI Details Patient Name: [name] Date of Service: 01/11/2016 2:15 PM Medical Record Number: [id number] Patient Account Number: [id number] Date of Birth/Sex: 29-May-1924 (82 y.o. Female) Treating RN: Phillis Haggis Primary Care Physician: Rolm Gala Other Clinician: Referring Physician: Rolm Gala Treating Physician/Extender: Rudene Re in Treatment: 17 History of Present Illness Severity: . HPI Description: 82 year old patient has been referred to as by her PCP for a pressure ulcer of the right heel. his had this for about 8 weeks Has been treated with duoderm. Has been there for a while it's getting more painful and larger and she also was seen for a recent urinary tract infection. She was given ciprofloxacin for this and then changed on to nitrofurantoin. She has a past medical history of rheumatic fever, hyperlipidemia, hypertension, scoliosis and osteopenia. never been a smoker. 10/05/2015 -- x-ray of the right heel -- IMPRESSION:No osseous finding of significance. Abnormal appearance of the soft tissues posterior to the heel which  could be due to infection or trauma. I do not see an intact Achilles tendon shadow. 12/07/2015 -- the patient has been approved for grafix and we will get this ready for her next week. 12/14/2015 -- she is here for her first application of Grafix. 12/21/2015 - the cause of the holiday we will change her application of Grafix to Monday and will order 16mm size for this day 01-04-16 [name] presents today with family for evaluation of her right heel stage III pressure ulcer. She nor the family admits to any complications or concerns since her last appointment. They state that she has been wearing the offloading boot while in bed and in the recliner. They have been using PolyMem. Electronic Signature(s) Signed: 01/11/2016 2:47:49 PM By: Evlyn Kanner MD, FACS Entered By: Evlyn Kanner on 01/11/2016 14:47:49 [name] ([id number]) -------------------------------------------------------------------------------- Physical Exam Details Patient Name: [name] Date of Service: 01/11/2016 2:15 PM Medical Record Number: [id number] Patient Account Number: [id number] Date of Birth/Sex: 1924-04-20 (82 y.o. Female) Treating RN: Phillis Haggis Primary Care Physician: Rolm Gala Other Clinician: Referring Physician: Rolm Gala Treating Physician/Extender: Rudene Re in Treatment: 17 Constitutional . Pulse regular. Respirations normal and unlabored. Afebrile. . Eyes Nonicteric. Reactive to light. Ears, Nose, Mouth, and Throat Lips, teeth, and gums WNL.Marland Kitchen Moist mucosa without lesions. Neck supple and nontender. No palpable supraclavicular or cervical adenopathy. Normal sized without goiter. Respiratory WNL. No retractions.. Breath sounds WNL, No rubs, rales, rhonchi, or wheeze.. Cardiovascular Heart rhythm and rate regular, no murmur or gallop.. Pedal Pulses WNL. No clubbing, cyanosis or edema. Chest Breasts symmetical and no nipple discharge.. Breast tissue WNL, no  masses, lumps, or tenderness.. Lymphatic No adneopathy. No adenopathy. No adenopathy. Musculoskeletal Adexa without tenderness or enlargement.. Digits and nails w/o clubbing, cyanosis, infection, petechiae, ischemia, or inflammatory conditions.. Integumentary (Hair, Skin) No suspicious lesions. No crepitus or fluctuance. No peri-wound warmth or erythema. No  masses.Marland Kitchen Psychiatric Judgement and insight Intact.. No evidence of depression, anxiety, or agitation.. Notes the wound is tiny but still open and I have gently remove some of the eschar, we will need local care at least another week Electronic Signature(s) Signed: 01/11/2016 2:48:14 PM By: Evlyn Kanner MD, FACS Entered By: Evlyn Kanner on 01/11/2016 14:48:13 [name] ([id number]) -------------------------------------------------------------------------------- Physician Orders Details Patient Name: [name] Date of Service: 01/11/2016 2:15 PM Medical Record Number: [id number] Patient Account Number: [id number] Date of Birth/Sex: Jul 05, 1924 (82 y.o. Female) Treating RN: Ashok Cordia, Debi Primary Care Physician: Rolm Gala Other Clinician: Referring Physician: Rolm Gala Treating Physician/Extender: Rudene Re in Treatment: 84 Verbal / Phone Orders: Yes Clinician: Ashok Cordia, Debi Read Back and Verified: Yes Diagnosis Coding Wound Cleansing Wound #1 Right Calcaneus o Cleanse wound with mild soap and water o May Shower, gently pat wound dry prior to applying new dressing. o May shower with protection. Anesthetic Wound #1 Right Calcaneus o Topical Lidocaine 4% cream applied to wound bed prior to debridement - In Clinic Primary Wound Dressing Wound #1 Right Calcaneus o Other: - polymem with silver Secondary Dressing Wound #1 Right Calcaneus o Gauze and Kerlix/Conform - netting Dressing Change Frequency Wound #1 Right Calcaneus o Three times weekly - Once in wound  center. Follow-up Appointments Wound #1 Right Calcaneus o Return Appointment in 1 week. Off-Loading Wound #1 Right Calcaneus o Heel suspension boot to: - [employer] Additional Orders / Instructions Wound #1 Right Calcaneus o Increase protein intake. o Activity as tolerated o Other: - Include these vitamins in your diet, MVI, ZINC, Vit C, Vit A [name], [name] ([id number]) Home Health Wound #1 Right Calcaneus o Continue Home Health Visits - [facility] o Home Health Nurse may visit PRN to address patientos wound care needs. o FACE TO FACE ENCOUNTER: MEDICARE and MEDICAID PATIENTS: I certify that this patient is under my care and that I had a face-to-face encounter that meets the physician face-to-face encounter requirements with this patient on this date. The encounter with the patient was in whole or in part for the following MEDICAL CONDITION: (primary reason for Home Healthcare) MEDICAL NECESSITY: I certify, that based on my findings, NURSING services are a medically necessary home health service. HOME BOUND STATUS: I certify that my clinical findings support that this patient is homebound (i.e., Due to illness or injury, pt requires aid of supportive devices such as crutches, cane, wheelchairs, walkers, the use of special transportation or the assistance of another person to leave their place of residence. There is a normal inability to leave the home and doing so requires considerable and taxing effort. Other absences are for medical reasons / religious services and are infrequent or of short duration when for other reasons). o If current dressing causes regression in wound condition, may D/C ordered dressing product/s and apply Normal Saline Moist Dressing daily until next Wound Healing Center / Other MD appointment. Notify Wound Healing Center of regression in wound condition at [phone]. o Please direct any NON-WOUND related issues/requests for orders to  patient's Primary Care Physician Electronic Signature(s) Signed: 01/11/2016 4:12:42 PM By: Evlyn Kanner MD, FACS Signed: 01/11/2016 5:08:32 PM By: Alejandro Mulling Entered By: Alejandro Mulling on 01/11/2016 14:53:06 [name] ([id number]) -------------------------------------------------------------------------------- Problem List Details Patient Name: [name] Date of Service: 01/11/2016 2:15 PM Medical Record Number: [id number] Patient Account Number: [id number] Date of Birth/Sex: 1924-08-05 (82 y.o. Female) Treating RN: Phillis Haggis Primary Care Physician: Rolm Gala Other Clinician: Referring Physician:  Rolm GalaGrandis, Heidi Treating Physician/Extender: Rudene ReBritto, Nakeitha Milligan Weeks in Treatment: 17 Active Problems ICD-10 Encounter Code Description Active Date Diagnosis L89.613 Pressure ulcer of right heel, stage 3 09/14/2015 Yes E44.1 Mild protein-calorie malnutrition 09/14/2015 Yes Inactive Problems Resolved Problems Electronic Signature(s) Signed: 01/11/2016 2:47:19 PM By: Evlyn KannerBritto, Dearies Meikle MD, FACS Entered By: Evlyn KannerBritto, Bryona Foxworthy on 01/11/2016 14:47:19 [name], Colleen Elbert EwingsL. ([id number]) -------------------------------------------------------------------------------- Progress Note Details Patient Name: [name], [name] Date of Service: 01/11/2016 2:15 PM Medical Record Number: [id number] Patient Account Number: [id number] Date of Birth/Sex: 03/22/1924 (82 y.o. Female) Treating RN: Phillis HaggisPinkerton, Debi Primary Care Physician: Rolm GalaGrandis, Heidi Other Clinician: Referring Physician: Rolm GalaGrandis, Heidi Treating Physician/Extender: Rudene ReBritto, Darcella Shiffman Weeks in Treatment: 17 Subjective Chief Complaint Information obtained from Patient patient presents to the clinic for evaluation of right heel stage III pressure ulcer History of Present Illness (HPI) The following HPI elements were documented for the patient's wound: Severity: . 82 year old patient has been referred to as by her PCP for a pressure  ulcer of the right heel. his had this for about 8 weeks Has been treated with duoderm. Has been there for a while it's getting more painful and larger and she also was seen for a recent urinary tract infection. She was given ciprofloxacin for this and then changed on to nitrofurantoin. She has a past medical history of rheumatic fever, hyperlipidemia, hypertension, scoliosis and osteopenia. never been a smoker. 10/05/2015 -- x-ray of the right heel -- IMPRESSION:No osseous finding of significance. Abnormal appearance of the soft tissues posterior to the heel which could be due to infection or trauma. I do not see an intact Achilles tendon shadow. 12/07/2015 -- the patient has been approved for grafix and we will get this ready for her next week. 12/14/2015 -- she is here for her first application of Grafix. 12/21/2015 - the cause of the holiday we will change her application of Grafix to Monday and will order 16mm size for this day 01-04-16 [name] presents today with family for evaluation of her right heel stage III pressure ulcer. She nor the family admits to any complications or concerns since her last appointment. They state that she has been wearing the offloading boot while in bed and in the recliner. They have been using PolyMem. Objective Constitutional Pulse regular. Respirations normal and unlabored. Afebrile. [name], [name] Kitchen. ([id number]) Vitals Time Taken: 2:32 PM, Height: 59 in, Weight: 108 lbs, BMI: 21.8, Temperature: 97.6 F, Pulse: 70 bpm, Respiratory Rate: 16 breaths/min, Blood Pressure: 160/65 mmHg. Eyes Nonicteric. Reactive to light. Ears, Nose, Mouth, and Throat Lips, teeth, and gums WNL.Marland Kitchen. Moist mucosa without lesions. Neck supple and nontender. No palpable supraclavicular or cervical adenopathy. Normal sized without goiter. Respiratory WNL. No retractions.. Breath sounds WNL, No rubs, rales, rhonchi, or wheeze.. Cardiovascular Heart rhythm and rate regular, no  murmur or gallop.. Pedal Pulses WNL. No clubbing, cyanosis or edema. Chest Breasts symmetical and no nipple discharge.. Breast tissue WNL, no masses, lumps, or tenderness.. Lymphatic No adneopathy. No adenopathy. No adenopathy. Musculoskeletal Adexa without tenderness or enlargement.. Digits and nails w/o clubbing, cyanosis, infection, petechiae, ischemia, or inflammatory conditions.Marland Kitchen. Psychiatric Judgement and insight Intact.. No evidence of depression, anxiety, or agitation.. General Notes: the wound is tiny but still open and I have gently remove some of the eschar, we will need local care at least another week Integumentary (Hair, Skin) No suspicious lesions. No crepitus or fluctuance. No peri-wound warmth or erythema. No masses.. Wound #1 status is Open. Original cause of wound was Pressure Injury. The wound is located  on the Right Calcaneus. The wound measures 0.3cm length x 0.4cm width x 0.2cm depth; 0.094cm^2 area and 0.019cm^3 volume. The wound is limited to skin breakdown. There is no tunneling or undermining noted. There is a medium amount of serous drainage noted. The wound margin is flat and intact. There is large (67-100%) pink granulation within the wound bed. There is no necrotic tissue within the wound bed. The periwound skin appearance exhibited: Dry/Scaly. The periwound skin appearance did not exhibit: Callus, Crepitus, Excoriation, Fluctuance, Friable, Induration, Localized Edema, Rash, Scarring, Maceration, Moist, Atrophie Blanche, Cyanosis, Ecchymosis, Hemosiderin Staining, Mottled, Pallor, Rubor, Erythema. Periwound temperature was noted as No Abnormality. The periwound has tenderness on palpation. [name], [name] ([id number]) Assessment Active Problems ICD-10 L89.613 - Pressure ulcer of right heel, stage 3 E44.1 - Mild protein-calorie malnutrition Plan Wound Cleansing: Wound #1 Right Calcaneus: Cleanse wound with mild soap and water May Shower, gently pat  wound dry prior to applying new dressing. May shower with protection. Anesthetic: Wound #1 Right Calcaneus: Topical Lidocaine 4% cream applied to wound bed prior to debridement - In Clinic Primary Wound Dressing: Wound #1 Right Calcaneus: Other: - polymem with silver Secondary Dressing: Wound #1 Right Calcaneus: Gauze and Kerlix/Conform - netting Dressing Change Frequency: Wound #1 Right Calcaneus: Three times weekly - Once in wound center. Follow-up Appointments: Wound #1 Right Calcaneus: Return Appointment in 1 week. Off-Loading: Wound #1 Right Calcaneus: Heel suspension boot to: - [employer] Chavez Additional Orders / Instructions: Wound #1 Right Calcaneus: Increase protein intake. Activity as tolerated Other: - Include these vitamins in your diet, MVI, ZINC, Vit C, Vit A Home Health: Wound #1 Right Calcaneus: [facility] Nurse may visit PRN to address patient s wound care needs. FACE TO FACE ENCOUNTER: MEDICARE and MEDICAID PATIENTS: I certify that this patient is under [name], [name] ([id number]) my care and that I had a face-to-face encounter that meets the physician face-to-face encounter requirements with this patient on this date. The encounter with the patient was in whole or in part for the following MEDICAL CONDITION: (primary reason for Home Healthcare) MEDICAL NECESSITY: I certify, that based on my findings, NURSING services are a medically necessary home health service. HOME BOUND STATUS: I certify that my clinical findings support that this patient is homebound (i.e., Due to illness or injury, pt requires aid of supportive devices such as crutches, cane, wheelchairs, walkers, the use of special transportation or the assistance of another person to leave their place of residence. There is a normal inability to leave the home and doing so requires considerable and taxing effort. Other absences are for medical reasons / religious  services and are infrequent or of short duration when for other reasons). If current dressing causes regression in wound condition, may D/C ordered dressing product/s and apply Normal Saline Moist Dressing daily until next Wound Healing Center / Other MD appointment. Notify Wound Healing Center of regression in wound condition at [phone]. Please direct any NON-WOUND related issues/requests for orders to patient's Primary Care Physician The wound looks excellent today and I have recommended we use some PolyMem foam over this heel to protect it, wrap it lightly and keep the dressing in place for the week. I anticipate discharge by next week. Electronic Signature(s) Signed: 01/11/2016 4:13:48 PM By: Evlyn KannerBritto, Azariah Bonura MD, FACS Previous Signature: 01/11/2016 2:48:56 PM Version By: Evlyn KannerBritto, Paige Monarrez MD, FACS Entered By: Evlyn KannerBritto, Ishi Danser on 01/11/2016 16:13:48 [name], Ayeza L. ([id number]) -------------------------------------------------------------------------------- SuperBill Details Patient Name: Ernest MallickISLEY,  Audery L. Date of Service: 01/11/2016 Medical Record Number: [id number] Patient Account Number: [id number] Date of Birth/Sex: 1925-01-04 (82 y.o. Female) Treating RN: Phillis Haggis Primary Care Physician: Rolm Gala Other Clinician: Referring Physician: Rolm Gala Treating Physician/Extender: Rudene Re in Treatment: 17 Diagnosis Coding ICD-10 Codes Code Description [phone] Pressure ulcer of right heel, stage 3 E44.1 Mild protein-calorie malnutrition Facility Procedures CPT4 Code: [id number] Description: 99213 - WOUND CARE VISIT-LEV 3 EST PT Modifier: Quantity: 1 Physician Procedures CPT4 Code: [id number] Description: 99213 - WC PHYS LEVEL 3 - EST PT ICD-10 Description Diagnosis L89.613 Pressure ulcer of right heel, stage 3 E44.1 Mild protein-calorie malnutrition Modifier: Quantity: 1 Electronic Signature(s) Signed: 01/11/2016 4:12:42 PM By: Evlyn Kanner MD, FACS Signed:  01/11/2016 5:08:32 PM By: Alejandro Mulling Previous Signature: 01/11/2016 2:49:43 PM Version By: Evlyn Kanner MD, FACS Entered By: Alejandro Mulling on 01/11/2016 15:16:13

## 2016-01-12 NOTE — Progress Notes (Signed)
Colleen, Chavez ([id number]) Visit Report for 01/11/2016 Arrival Information Details Patient Name: [name], [name] Date of Service: 01/11/2016 2:15 PM Medical Record Number: [id number] Patient Account Number: [id number] Date of Birth/Sex: Mar 26, 1924 (82 y.o. Female) Treating RN: Phillis Haggis Primary Care Physician: Rolm Gala Other Clinician: Referring Physician: Rolm Gala Treating Physician/Extender: Rudene Re in Treatment: 17 Visit Information History Since Last Visit Pain Present Now: No Patient Arrived: Wheel Chair Arrival Time: 14:28 Accompanied By: daughter Transfer Assistance: EasyPivot Patient Lift Patient Identification Verified: Yes Secondary Verification Process Yes Completed: Patient Requires Transmission- No Based Precautions: Patient Has Alerts: Yes Patient Alerts: Patient on Blood Thinner eliquis Electronic Signature(s) Signed: 01/11/2016 5:08:32 PM By: Alejandro Mulling Entered By: Alejandro Mulling on 01/11/2016 14:30:01 [name] ([id number]) -------------------------------------------------------------------------------- Clinic Level of Care Assessment Details Patient Name: [name] Date of Service: 01/11/2016 2:15 PM Medical Record Number: [id number] Patient Account Number: [id number] Date of Birth/Sex: 04-05-1924 (82 y.o. Female) Treating RN: Ashok Cordia, Debi Primary Care Physician: Rolm Gala Other Clinician: Referring Physician: Rolm Gala Treating Physician/Extender: Rudene Re in Treatment: 17 Clinic Level of Care Assessment Items TOOL 4 Quantity Score X - Use when only an EandM is performed on FOLLOW-UP visit 1 0 ASSESSMENTS - Nursing Assessment / Reassessment X - Reassessment of Co-morbidities (includes updates in patient status) 1 10 X - Reassessment of Adherence to Treatment Plan 1 5 ASSESSMENTS - Wound and Skin Assessment / Reassessment X - Simple Wound Assessment / Reassessment - one wound  1 5 []  - Complex Wound Assessment / Reassessment - multiple wounds 0 []  - Dermatologic / Skin Assessment (not related to wound area) 0 ASSESSMENTS - Focused Assessment []  - Circumferential Edema Measurements - multi extremities 0 []  - Nutritional Assessment / Counseling / Intervention 0 []  - Lower Extremity Assessment (monofilament, tuning fork, pulses) 0 []  - Peripheral Arterial Disease Assessment (using hand held doppler) 0 ASSESSMENTS - Ostomy and/or Continence Assessment and Care []  - Incontinence Assessment and Management 0 []  - Ostomy Care Assessment and Management (repouching, etc.) 0 PROCESS - Coordination of Care []  - Simple Patient / Family Education for ongoing care 0 X - Complex (extensive) Patient / Family Education for ongoing care 1 20 X - Staff obtains Chiropractor, Records, Test Results / Process Orders 1 10 X - Staff telephones HHA, Nursing Homes / Clarify orders / etc 1 10 []  - Routine Transfer to another Facility (non-emergent condition) 0 Barkan, Salma L. ([id number]) []  - Routine Hospital Admission (non-emergent condition) 0 []  - New Admissions / Manufacturing engineer / Ordering NPWT, Apligraf, etc. 0 []  - Emergency Hospital Admission (emergent condition) 0 X - Simple Discharge Coordination 1 10 []  - Complex (extensive) Discharge Coordination 0 PROCESS - Special Needs []  - Pediatric / Minor Patient Management 0 []  - Isolation Patient Management 0 []  - Hearing / Language / Visual special needs 0 []  - Assessment of Community assistance (transportation, D/C planning, etc.) 0 []  - Additional assistance / Altered mentation 0 []  - Support Surface(s) Assessment (bed, cushion, seat, etc.) 0 INTERVENTIONS - Wound Cleansing / Measurement X - Simple Wound Cleansing - one wound 1 5 []  - Complex Wound Cleansing - multiple wounds 0 X - Wound Imaging (photographs - any number of wounds) 1 5 []  - Wound Tracing (instead of photographs) 0 X - Simple Wound Measurement - one wound  1 5 []  - Complex Wound Measurement - multiple wounds 0 INTERVENTIONS - Wound Dressings X - Small Wound Dressing one or multiple wounds 1  10 []  - Medium Wound Dressing one or multiple wounds 0 []  - Large Wound Dressing one or multiple wounds 0 X - Application of Medications - topical 1 5 []  - Application of Medications - injection 0 INTERVENTIONS - Miscellaneous []  - External ear exam 0 Guerrette, Chancie L. ([id number]) []  - Specimen Collection (cultures, biopsies, blood, body fluids, etc.) 0 []  - Specimen(s) / Culture(s) sent or taken to Lab for analysis 0 X - Patient Transfer (multiple staff / Michiel SitesHoyer Lift / Similar devices) 1 10 []  - Simple Staple / Suture removal (25 or less) 0 []  - Complex Staple / Suture removal (26 or more) 0 []  - Hypo / Hyperglycemic Management (close monitor of Blood Glucose) 0 []  - Ankle / Brachial Index (ABI) - do not check if billed separately 0 X - Vital Signs 1 5 Has the patient been seen at the hospital within the last three years: Yes Total Score: 115 Level Of Care: New/Established - Level 3 Electronic Signature(s) Signed: 01/11/2016 5:08:32 PM By: Alejandro MullingPinkerton, Debra Entered By: Alejandro MullingPinkerton, Debra on 01/11/2016 15:16:07 Colleen QualeISLEY, Hanadi L. ([id number]) -------------------------------------------------------------------------------- Encounter Discharge Information Details Patient Name: Colleen QualeISLEY, [name] Date of Service: 01/11/2016 2:15 PM Medical Record Number: [id number] Patient Account Number: [id number] Date of Birth/Sex: 01/26/1925 (82 y.o. Female) Treating RN: Phillis HaggisPinkerton, Debi Primary Care Physician: Rolm GalaGrandis, Heidi Other Clinician: Referring Physician: Rolm GalaGrandis, Heidi Treating Physician/Extender: Rudene ReBritto, Errol Weeks in Treatment: [id number] Encounter Discharge Information Items Discharge Pain Level: 0 Discharge Condition: Stable Ambulatory Status: Wheelchair Discharge Destination: Home Transportation: Private Auto Accompanied By: daughter Schedule Follow-up  Appointment: Yes Medication Reconciliation completed and provided to Patient/Care Yes Tamica Covell: Provided on Clinical Summary of Care: 01/11/2016 Form Type Recipient Paper Patient DI Electronic Signature(s) Signed: 01/11/2016 2:54:34 PM By: Gwenlyn PerkingMoore, Shelia Entered By: Gwenlyn PerkingMoore, Shelia on 01/11/2016 14:54:34 Colleen QualeISLEY, Shadoe L. ([id number]) -------------------------------------------------------------------------------- Lower Extremity Assessment Details Patient Name: Colleen QualeISLEY, Oluwatosin L. Date of Service: 01/11/2016 2:15 PM Medical Record Number: [id number] Patient Account Number: [id number] Date of Birth/Sex: 08/13/1924 (82 y.o. Female) Treating RN: Phillis HaggisPinkerton, Debi Primary Care Physician: Rolm GalaGrandis, Heidi Other Clinician: Referring Physician: Rolm GalaGrandis, Heidi Treating Physician/Extender: Rudene ReBritto, Errol Weeks in Treatment: 17 Vascular Assessment Pulses: Posterior Tibial Dorsalis Pedis Palpable: [Right:Yes] Extremity colors, hair growth, and conditions: Extremity Color: [Right:Normal] Temperature of Extremity: [Right:Warm] Capillary Refill: [Right:< 3 seconds] Toe Nail Assessment Left: Right: Thick: No Discolored: Yes Deformed: No Improper Length and Hygiene: No Electronic Signature(s) Signed: 01/11/2016 5:08:32 PM By: Alejandro MullingPinkerton, Debra Entered By: Alejandro MullingPinkerton, Debra on 01/11/2016 14:34:34 Brooker, Charmagne Elbert EwingsL. ([id number]) -------------------------------------------------------------------------------- Multi Wound Chart Details Patient Name: Colleen QualeISLEY, Zniyah L. Date of Service: 01/11/2016 2:15 PM Medical Record Number: [id number] Patient Account Number: [id number] Date of Birth/Sex: 03/13/1924 (82 y.o. Female) Treating RN: Phillis HaggisPinkerton, Debi Primary Care Physician: Rolm GalaGrandis, Heidi Other Clinician: Referring Physician: Rolm GalaGrandis, Heidi Treating Physician/Extender: Rudene ReBritto, Errol Weeks in Treatment: 17 Vital Signs Height(in): 59 Pulse(bpm): 70 Weight(lbs): 108 Blood Pressure 160/65 (mmHg): Body Mass  Index(BMI): 22 Temperature(F): 97.6 Respiratory Rate 16 (breaths/min): Photos: [1:No Photos] [N/A:N/A] Wound Location: [1:Right Calcaneus] [N/A:N/A] Wounding Event: [1:Pressure Injury] [N/A:N/A] Primary Etiology: [1:Pressure Ulcer] [N/A:N/A] Comorbid History: [1:Deep Vein Thrombosis, Hypertension, Dementia] [N/A:N/A] Date Acquired: [1:07/14/2015] [N/A:N/A] Weeks of Treatment: [1:17] [N/A:N/A] Wound Status: [1:Open] [N/A:N/A] Measurements L x W x D 0.2x0.2x0.2 [N/A:N/A] (cm) Area (cm) : [1:0.031] [N/A:N/A] Volume (cm) : [1:0.006] [N/A:N/A] % Reduction in Area: [1:99.40%] [N/A:N/A] % Reduction in Volume: 99.50% [N/A:N/A] Classification: [1:Category/Stage III] [N/A:N/A] Exudate Amount: [1:Medium] [N/A:N/A] Exudate Type: [1:Serous] [N/A:N/A] Exudate Color: [1:amber] [N/A:N/A] Wound Margin: [1:Flat and Intact] [N/A:N/A] Granulation  Amount: [1:Large (67-100%)] [N/A:N/A] Granulation Quality: [1:Pink] [N/A:N/A] Necrotic Amount: [1:None Present (0%)] [N/A:N/A] Exposed Structures: [1:Fascia: No Fat: No Tendon: No Muscle: No Joint: No Bone: No] [N/A:N/A] Limited to Skin Breakdown Epithelialization: Large (67-100%) N/A N/A Periwound Skin Texture: Edema: No N/A N/A Excoriation: No Induration: No Callus: No Crepitus: No Fluctuance: No Friable: No Rash: No Scarring: No Periwound Skin Dry/Scaly: Yes N/A N/A Moisture: Maceration: No Moist: No Periwound Skin Color: Atrophie Blanche: No N/A N/A Cyanosis: No Ecchymosis: No Erythema: No Hemosiderin Staining: No Mottled: No Pallor: No Rubor: No Temperature: No Abnormality N/A N/A Tenderness on Yes N/A N/A Palpation: Wound Preparation: Ulcer Cleansing: N/A N/A Rinsed/Irrigated with Saline Topical Anesthetic Applied: Other: lidocaine 4% Treatment Notes Electronic Signature(s) Signed: 01/11/2016 5:08:32 PM By: Alejandro Mulling Entered By: Alejandro Mulling on 01/11/2016 14:40:59 [name]  ([id number]) -------------------------------------------------------------------------------- Multi-Disciplinary Care Plan Details Patient Name: [name] Date of Service: 01/11/2016 2:15 PM Medical Record Number: [id number] Patient Account Number: [id number] Date of Birth/Sex: [birth year] (82 y.o. Female) Treating RN: Ashok Cordia, Debi Primary Care Physician: Rolm Gala Other Clinician: Referring Physician: Rolm Gala Treating Physician/Extender: Rudene Re in Treatment: 82 Active Inactive Abuse / Safety / Falls / Self Care Management Nursing Diagnoses: Impaired home maintenance Impaired physical mobility Potential for falls Self care deficit: actual or potential Goals: Patient will remain injury free Date Initiated: 09/14/2015 Goal Status: Active Patient/caregiver will verbalize understanding of skin care regimen Date Initiated: 09/14/2015 Goal Status: Active Patient/caregiver will verbalize/demonstrate measure taken to improve self care Date Initiated: 09/14/2015 Goal Status: Active Patient/caregiver will verbalize/demonstrate measures taken to improve the patient's personal safety Date Initiated: 09/14/2015 Goal Status: Active Patient/caregiver will verbalize/demonstrate measures taken to prevent injury and/or falls Date Initiated: 09/14/2015 Goal Status: Active Patient/caregiver will verbalize/demonstrate understanding of what to do in case of emergency Date Initiated: 09/14/2015 Goal Status: Active Interventions: Assess fall risk on admission and as needed Assess: immobility, friction, shearing, incontinence upon admission and as needed Assess impairment of mobility on admission and as needed per policy Assess self care needs on admission and as needed Provide education on basic hygiene Provide education on personal and home safety TAMELA, ELSAYED ([id number]) Provide education on safe transfers Notes: Orientation to the Wound Care Program Nursing  Diagnoses: Knowledge deficit related to the wound healing center program Goals: Patient/caregiver will verbalize understanding of the Wound Healing Center Program Date Initiated: 09/14/2015 Goal Status: Active Interventions: Provide education on orientation to the wound center Notes: Pressure Nursing Diagnoses: Knowledge deficit related to causes and risk factors for pressure ulcer development Knowledge deficit related to management of pressures ulcers Potential for impaired tissue integrity related to pressure, friction, moisture, and shear Goals: Patient will remain free from development of additional pressure ulcers Date Initiated: 09/14/2015 Goal Status: Active Patient will remain free of pressure ulcers Date Initiated: 09/14/2015 Goal Status: Active Patient/caregiver will verbalize risk factors for pressure ulcer development Date Initiated: 09/14/2015 Goal Status: Active Patient/caregiver will verbalize understanding of pressure ulcer management Date Initiated: 09/14/2015 Goal Status: Active Interventions: Assess: immobility, friction, shearing, incontinence upon admission and as needed Assess offloading mechanisms upon admission and as needed Assess potential for pressure ulcer upon admission and as needed Provide education on pressure ulcers Treatment Activities: MAILANI, DEGROOTE ([id number]) Patient referred for pressure reduction/relief devices : 09/14/2015 Patient referred for seating evaluation to ensure proper offloading : 09/14/2015 Pressure reduction/relief device ordered : 09/14/2015 Notes: Wound/Skin Impairment Nursing Diagnoses: Impaired tissue integrity Knowledge deficit related to ulceration/compromised skin integrity Goals: Patient/caregiver  will verbalize understanding of skin care regimen Date Initiated: 09/14/2015 Goal Status: Active Ulcer/skin breakdown will have a volume reduction of 30% by week 4 Date Initiated: 09/14/2015 Goal Status:  Active Ulcer/skin breakdown will have a volume reduction of 50% by week 8 Date Initiated: 09/14/2015 Goal Status: Active Ulcer/skin breakdown will have a volume reduction of 80% by week 12 Date Initiated: 09/14/2015 Goal Status: Active Ulcer/skin breakdown will heal within 14 weeks Date Initiated: 09/14/2015 Goal Status: Active Interventions: Assess patient/caregiver ability to obtain necessary supplies Assess patient/caregiver ability to perform ulcer/skin care regimen upon admission and as needed Assess ulceration(s) every visit Provide education on ulcer and skin care Treatment Activities: Referred to DME Aryianna Earwood for dressing supplies : 09/14/2015 Skin care regimen initiated : 09/14/2015 Topical wound management initiated : 09/14/2015 Notes: Electronic Signature(s) Signed: 01/11/2016 5:08:32 PM By: Alejandro Mulling Entered By: Alejandro Mulling on 01/11/2016 14:40:46 [name] ([id number] Mallick, Theora Gianotti ([id number]) -------------------------------------------------------------------------------- Pain Assessment Details Patient Name: [name] Date of Service: 01/11/2016 2:15 PM Medical Record Number: [id number] Patient Account Number: [id number] Date of Birth/Sex: 28-Mar-1924 (82 y.o. Female) Treating RN: Phillis Haggis Primary Care Physician: Rolm Gala Other Clinician: Referring Physician: Rolm Gala Treating Physician/Extender: Rudene Re in Treatment: 17 Active Problems Location of Pain Severity and Description of Pain Patient Has Paino No Site Locations With Dressing Change: No Pain Management and Medication Current Pain Management: Electronic Signature(s) Signed: 01/11/2016 5:08:32 PM By: Alejandro Mulling Entered By: Alejandro Mulling on 01/11/2016 14:30:27 [name] ([id number]) -------------------------------------------------------------------------------- Patient/Caregiver Education Details Patient Name: [name] Date of Service: 01/11/2016 2:15 PM Medical Record Number: [id number] Patient Account Number: [id number] Date of Birth/Gender: [birth year] (82 y.o. Female) Treating RN: Phillis Haggis Primary Care Physician: Rolm Gala Other Clinician: Referring Physician: Rolm Gala Treating Physician/Extender: Rudene Re in Treatment: 17 Education Assessment Education Provided To: Patient Education Topics Provided Wound/Skin Impairment: Handouts: Other: change dressing as ordered Methods: Demonstration, Explain/Verbal Responses: State content correctly Electronic Signature(s) Signed: 01/11/2016 5:08:32 PM By: Alejandro Mulling Entered By: Alejandro Mulling on 01/11/2016 14:54:11 Arseneault, Jaela Elbert Ewings ([id number]) -------------------------------------------------------------------------------- Wound Assessment Details Patient Name: [name] Date of Service: 01/11/2016 2:15 PM Medical Record Number: [id number] Patient Account Number: [id number] Date of Birth/Sex: [birth year] (82 y.o. Female) Treating RN: Ashok Cordia, Debi Primary Care Physician: Rolm Gala Other Clinician: Referring Physician: Rolm Gala Treating Physician/Extender: Rudene Re in Treatment: 17 Wound Status Wound Number: 1 Primary Pressure Ulcer Etiology: Wound Location: Right Calcaneus Wound Status: Open Wounding Event: Pressure Injury Comorbid Deep Vein Thrombosis, Date Acquired: 07/14/2015 History: Hypertension, Dementia Weeks Of Treatment: 17 Clustered Wound: No Photos Photo Uploaded By: Alejandro Mulling on 01/11/2016 15:51:12 Wound Measurements Length: (cm) 0.3 Width: (cm) 0.4 Depth: (cm) 0.2 Area: (cm) 0.094 Volume: (cm) 0.019 % Reduction in Area: 98.3% % Reduction in Volume: 98.3% Epithelialization: Large (67-100%) Tunneling: No Undermining: No Wound Description Classification: Category/Stage III Wound Margin: Flat and Intact Exudate Amount: Medium Exudate Type:  Serous Exudate Color: amber Foul Odor After Cleansing: No Wound Bed Granulation Amount: Large (67-100%) Exposed Structure Granulation Quality: Pink Fascia Exposed: No Necrotic Amount: None Present (0%) Fat Layer Exposed: No Tendon Exposed: No Deist, Primrose L. ([id number]) Muscle Exposed: No Joint Exposed: No Bone Exposed: No Limited to Skin Breakdown Periwound Skin Texture Texture Color No Abnormalities Noted: No No Abnormalities Noted: No Callus: No Atrophie Blanche: No Crepitus: No Cyanosis: No Excoriation: No Ecchymosis: No Fluctuance: No Erythema: No Friable: No Hemosiderin Staining: No Induration: No Mottled: No Localized  Edema: No Pallor: No Rash: No Rubor: No Scarring: No Temperature / Pain Moisture Temperature: No Abnormality No Abnormalities Noted: No Tenderness on Palpation: Yes Dry / Scaly: Yes Maceration: No Moist: No Wound Preparation Ulcer Cleansing: Rinsed/Irrigated with Saline Topical Anesthetic Applied: Other: lidocaine 4%, Treatment Notes Wound #1 (Right Calcaneus) 1. Cleansed with: Clean wound with Normal Saline 2. Anesthetic Topical Lidocaine 4% cream to wound bed prior to debridement 4. Dressing Applied: Other dressing (specify in notes) 5. Secondary Dressing Applied Dry Gauze Kerlix/Conform 7. Secured with Tape Notes polymem with silver, netting Electronic Signature(s) Signed: 01/11/2016 5:08:32 PM By: Alejandro MullingPinkerton, Debra Entered By: Alejandro MullingPinkerton, Debra on 01/11/2016 14:44:16 Colleen QualeISLEY, Aliannah L. ([id number] Chavez) Coletta, Besse L. ([id number]) -------------------------------------------------------------------------------- Vitals Details Patient Name: Colleen QualeISLEY, Yalena L. Date of Service: 01/11/2016 2:15 PM Medical Record Number: [id number] Patient Account Number: [id number] Date of Birth/Sex: 06/10/1924 (82 y.o. Female) Treating RN: Ashok CordiaPinkerton, Debi Primary Care Physician: Rolm GalaGrandis, Heidi Other Clinician: Referring Physician: Rolm GalaGrandis,  Heidi Treating Physician/Extender: Rudene ReBritto, Errol Weeks in Treatment: 17 Vital Signs Time Taken: 14:32 Temperature (F): 97.6 Height (in): 59 Pulse (bpm): 70 Weight (lbs): 108 Respiratory Rate (breaths/min): 16 Body Mass Index (BMI): 21.8 Blood Pressure (mmHg): 160/65 Reference Range: 80 - 120 mg / dl Electronic Signature(s) Signed: 01/11/2016 5:08:32 PM By: Alejandro MullingPinkerton, Debra Entered By: Alejandro MullingPinkerton, Debra on 01/11/2016 14:34:04

## 2016-01-18 ENCOUNTER — Encounter: Payer: Medicare Other | Admitting: Surgery

## 2016-01-18 DIAGNOSIS — L89613 Pressure ulcer of right heel, stage 3: Secondary | ICD-10-CM | POA: Diagnosis not present

## 2016-01-19 ENCOUNTER — Ambulatory Visit: Payer: Medicare Other | Admitting: Surgery

## 2016-01-19 NOTE — Progress Notes (Signed)
[name], [name] ([id number]) Visit Report for 01/18/2016 Arrival Information Details Patient Name: [name], [name] Date of Service: 01/18/2016 1:30 PM Medical Record Number: [id number] Patient Account Number: [id number] Date of Birth/Sex: December 01, 1924 (82 y.o. Female) Treating RN: Ashok Cordia, Debi Primary Care Physician: Rolm Gala Other Clinician: Referring Physician: Rolm Gala Treating Physician/Extender: Rudene Re in Treatment: 18 Visit Information History Since Last Visit All ordered tests and consults were completed: No Patient Arrived: Wheel Chair Added or deleted any medications: No Arrival Time: 13:36 Any new allergies or adverse reactions: No Accompanied By: daughter Had a fall or experienced change in No Transfer Assistance: EasyPivot Patient activities of daily living that may affect Lift risk of falls: Patient Identification Verified: Yes Signs or symptoms of abuse/neglect since last No Secondary Verification Process Yes visito Completed: Hospitalized since last visit: No Patient Requires Transmission- No Has Dressing in Place as Prescribed: Yes Based Precautions: Pain Present Now: No Patient Has Alerts: Yes Patient Alerts: Patient on Blood Thinner eliquis Electronic Signature(s) Signed: 01/18/2016 4:30:33 PM By: Alejandro Mulling Entered By: Alejandro Mulling on 01/18/2016 13:47:12 Spraker, Khloi Elbert Ewings ([id number]) -------------------------------------------------------------------------------- Clinic Level of Care Assessment Details Patient Name: [name] Date of Service: 01/18/2016 1:30 PM Medical Record Number: [id number] Patient Account Number: [id number] Date of Birth/Sex: [birth year] (82 y.o. Female) Treating RN: Ashok Cordia, Debi Primary Care Physician: Rolm Gala Other Clinician: Referring Physician: Rolm Gala Treating Physician/Extender: Rudene Re in Treatment: 18 Clinic Level of Care Assessment Items TOOL 4  Quantity Score X - Use when only an EandM is performed on FOLLOW-UP visit 1 0 ASSESSMENTS - Nursing Assessment / Reassessment []  - Reassessment of Co-morbidities (includes updates in patient status) 0 []  - Reassessment of Adherence to Treatment Plan 0 ASSESSMENTS - Wound and Skin Assessment / Reassessment X - Simple Wound Assessment / Reassessment - one wound 1 5 []  - Complex Wound Assessment / Reassessment - multiple wounds 0 []  - Dermatologic / Skin Assessment (not related to wound area) 0 ASSESSMENTS - Focused Assessment []  - Circumferential Edema Measurements - multi extremities 0 []  - Nutritional Assessment / Counseling / Intervention 0 []  - Lower Extremity Assessment (monofilament, tuning fork, pulses) 0 []  - Peripheral Arterial Disease Assessment (using hand held doppler) 0 ASSESSMENTS - Ostomy and/or Continence Assessment and Care []  - Incontinence Assessment and Management 0 []  - Ostomy Care Assessment and Management (repouching, etc.) 0 PROCESS - Coordination of Care X - Simple Patient / Family Education for ongoing care 1 15 []  - Complex (extensive) Patient / Family Education for ongoing care 0 X - Staff obtains Chiropractor, Records, Test Results / Process Orders 1 10 []  - Staff telephones HHA, Nursing Homes / Clarify orders / etc 0 []  - Routine Transfer to another Facility (non-emergent condition) 0 Gastelum, Tiffanyann L. ([id number]) []  - Routine Hospital Admission (non-emergent condition) 0 []  - New Admissions / Manufacturing engineer / Ordering NPWT, Apligraf, etc. 0 []  - Emergency Hospital Admission (emergent condition) 0 X - Simple Discharge Coordination 1 10 []  - Complex (extensive) Discharge Coordination 0 PROCESS - Special Needs []  - Pediatric / Minor Patient Management 0 []  - Isolation Patient Management 0 []  - Hearing / Language / Visual special needs 0 []  - Assessment of Community assistance (transportation, D/C planning, etc.) 0 []  - Additional assistance / Altered  mentation 0 []  - Support Surface(s) Assessment (bed, cushion, seat, etc.) 0 INTERVENTIONS - Wound Cleansing / Measurement X - Simple Wound Cleansing - one wound 1 5 []  - Complex Wound Cleansing -  multiple wounds 0 X - Wound Imaging (photographs - any number of wounds) 1 5 []  - Wound Tracing (instead of photographs) 0 []  - Simple Wound Measurement - one wound 0 []  - Complex Wound Measurement - multiple wounds 0 INTERVENTIONS - Wound Dressings X - Small Wound Dressing one or multiple wounds 1 10 []  - Medium Wound Dressing one or multiple wounds 0 []  - Large Wound Dressing one or multiple wounds 0 []  - Application of Medications - topical 0 []  - Application of Medications - injection 0 INTERVENTIONS - Miscellaneous []  - External ear exam 0 Leavell, Bassheva L. ([id number]) []  - Specimen Collection (cultures, biopsies, blood, body fluids, etc.) 0 []  - Specimen(s) / Culture(s) sent or taken to Lab for analysis 0 X - Patient Transfer (multiple staff / Michiel Sites Lift / Similar devices) 1 10 []  - Simple Staple / Suture removal (25 or less) 0 []  - Complex Staple / Suture removal (26 or more) 0 []  - Hypo / Hyperglycemic Management (close monitor of Blood Glucose) 0 []  - Ankle / Brachial Index (ABI) - do not check if billed separately 0 X - Vital Signs 1 5 Has the patient been seen at the hospital within the last three years: Yes Total Score: 75 Level Of Care: New/Established - Level 2 Electronic Signature(s) Signed: 01/18/2016 4:30:33 PM By: Alejandro Mulling Entered By: Alejandro Mulling on 01/18/2016 15:34:51 Vallery, Theora Gianotti ([id number]) -------------------------------------------------------------------------------- Encounter Discharge Information Details Patient Name: [name] Date of Service: 01/18/2016 1:30 PM Medical Record Number: [id number] Patient Account Number: [id number] Date of Birth/Sex: 03-11-1924 (82 y.o. Female) Treating RN: Phillis Haggis Primary Care Physician:  Rolm Gala Other Clinician: Referring Physician: Rolm Gala Treating Physician/Extender: Rudene Re in Treatment: 51 Encounter Discharge Information Items Discharge Pain Level: 0 Discharge Condition: Stable Ambulatory Status: Wheelchair Discharge Destination: Home Transportation: Private Auto Accompanied By: daughter Schedule Follow-up Appointment: Yes Medication Reconciliation completed and provided to Patient/Care Yes Barnett Elzey: Provided on Clinical Summary of Care: 01/18/2016 Form Type Recipient Paper Patient DI Electronic Signature(s) Signed: 01/18/2016 2:19:21 PM By: Gwenlyn Perking Entered By: Gwenlyn Perking on 01/18/2016 14:19:21 [name] ([id number]) -------------------------------------------------------------------------------- Lower Extremity Assessment Details Patient Name: [name] Date of Service: 01/18/2016 1:30 PM Medical Record Number: [id number] Patient Account Number: [id number] Date of Birth/Sex: [birth year] (82 y.o. Female) Treating RN: Phillis Haggis Primary Care Physician: Rolm Gala Other Clinician: Referring Physician: Rolm Gala Treating Physician/Extender: Rudene Re in Treatment: 18 Vascular Assessment Pulses: Posterior Tibial Dorsalis Pedis Palpable: [Right:Yes] Extremity colors, hair growth, and conditions: Extremity Color: [Right:Normal] Temperature of Extremity: [Right:Warm] Capillary Refill: [Right:< 3 seconds] Toe Nail Assessment Left: Right: Thick: No Discolored: Yes Deformed: No Improper Length and Hygiene: No Electronic Signature(s) Signed: 01/18/2016 4:30:33 PM By: Alejandro Mulling Entered By: Alejandro Mulling on 01/18/2016 13:50:39 Quincy, Sharde Elbert Ewings ([id number]) -------------------------------------------------------------------------------- Multi Wound Chart Details Patient Name: [name] Date of Service: 01/18/2016 1:30 PM Medical Record Number: [id number] Patient  Account Number: [id number] Date of Birth/Sex: 26-Jul-1924 (82 y.o. Female) Treating RN: Phillis Haggis Primary Care Physician: Rolm Gala Other Clinician: Referring Physician: Rolm Gala Treating Physician/Extender: Rudene Re in Treatment: 18 Vital Signs Height(in): 59 Pulse(bpm): 86 Weight(lbs): 108 Blood Pressure 154/83 (mmHg): Body Mass Index(BMI): 22 Temperature(F): Respiratory Rate 16 (breaths/min): Photos: [1:No Photos] [N/A:N/A] Wound Location: [1:Right Calcaneus] [N/A:N/A] Wounding Event: [1:Pressure Injury] [N/A:N/A] Primary Etiology: [1:Pressure Ulcer] [N/A:N/A] Comorbid History: [1:Deep Vein Thrombosis, Hypertension, Dementia] [N/A:N/A] Date Acquired: [1:07/14/2015] [N/A:N/A] Weeks of Treatment: [1:18] [N/A:N/A] Wound Status: [1:Open] [N/A:N/A] Measurements  L x W x D 0x0x0 [N/A:N/A] (cm) Area (cm) : [1:0] [N/A:N/A] Volume (cm) : [1:0] [N/A:N/A] % Reduction in Area: [1:100.00%] [N/A:N/A] % Reduction in Volume: 100.00% [N/A:N/A] Classification: [1:Category/Stage III] [N/A:N/A] Exudate Amount: [1:None Present] [N/A:N/A] Wound Margin: [1:Flat and Intact] [N/A:N/A] Granulation Amount: [1:None Present (0%)] [N/A:N/A] Necrotic Amount: [1:None Present (0%)] [N/A:N/A] Exposed Structures: [1:Fascia: No Fat: No Tendon: No Muscle: No Joint: No Bone: No Limited to Skin Breakdown] [N/A:N/A] Epithelialization: [1:Large (67-100%)] [N/A:N/A] Periwound Skin Texture: [N/A:N/A] Edema: No Excoriation: No Induration: No Callus: No Crepitus: No Fluctuance: No Friable: No Rash: No Scarring: No Periwound Skin Maceration: No N/A N/A Moisture: Moist: No Dry/Scaly: No Periwound Skin Color: Atrophie Blanche: No N/A N/A Cyanosis: No Ecchymosis: No Erythema: No Hemosiderin Staining: No Mottled: No Pallor: No Rubor: No Temperature: No Abnormality N/A N/A Tenderness on Yes N/A N/A Palpation: Wound Preparation: Ulcer Cleansing: N/A N/A Rinsed/Irrigated  with Saline Topical Anesthetic Applied: None Treatment Notes Electronic Signature(s) Signed: 01/18/2016 4:30:33 PM By: Alejandro MullingPinkerton, Debra Entered By: Alejandro MullingPinkerton, Debra on 01/18/2016 14:04:09 [name], Krisha L. ([id number]) -------------------------------------------------------------------------------- Multi-Disciplinary Care Plan Details Patient Name: [name], Mellie L. Date of Service: 01/18/2016 1:30 PM Medical Record Number: [id number] Patient Account Number: [id number] Date of Birth/Sex: 02/09/1924 (82 y.o. Female) Treating RN: Phillis HaggisPinkerton, Debi Primary Care Physician: Rolm GalaGrandis, Heidi Other Clinician: Referring Physician: Rolm GalaGrandis, Heidi Treating Physician/Extender: Rudene ReBritto, Errol Weeks in Treatment: 5018 Active Inactive Electronic Signature(s) Signed: 01/18/2016 4:30:33 PM By: Alejandro MullingPinkerton, Debra Entered By: Alejandro MullingPinkerton, Debra on 01/18/2016 14:04:01 [name], Laure L. ([id number]) -------------------------------------------------------------------------------- Pain Assessment Details Patient Name: [name], Monya L. Date of Service: 01/18/2016 1:30 PM Medical Record Number: [id number] Patient Account Number: [id number] Date of Birth/Sex: 01/09/1925 (82 y.o. Female) Treating RN: Phillis HaggisPinkerton, Debi Primary Care Physician: Rolm GalaGrandis, Heidi Other Clinician: Referring Physician: Rolm GalaGrandis, Heidi Treating Physician/Extender: Rudene ReBritto, Errol Weeks in Treatment: 5318 Active Problems Location of Pain Severity and Description of Pain Patient Has Paino No Site Locations With Dressing Change: No Pain Management and Medication Current Pain Management: Electronic Signature(s) Signed: 01/18/2016 4:30:33 PM By: Alejandro MullingPinkerton, Debra Entered By: Alejandro MullingPinkerton, Debra on 01/18/2016 13:49:32 [name], Martasia L. ([id number]) -------------------------------------------------------------------------------- Patient/Caregiver Education Details Patient Name: [name], Kaytee L. Date of Service: 01/18/2016 1:30 PM Medical Record  Number: [id number] Patient Account Number: [id number] Date of Birth/Gender: 09/05/1924 (82 y.o. Female) Treating RN: Phillis HaggisPinkerton, Debi Primary Care Physician: Rolm GalaGrandis, Heidi Other Clinician: Referring Physician: Rolm GalaGrandis, Heidi Treating Physician/Extender: Rudene ReBritto, Errol Weeks in Treatment: 6618 Education Assessment Education Provided To: Patient Education Topics Provided Wound/Skin Impairment: Handouts: Other: Please call our office if you have any questions or concerns. Methods: Explain/Verbal Responses: State content correctly Electronic Signature(s) Signed: 01/18/2016 4:30:33 PM By: Alejandro MullingPinkerton, Debra Entered By: Alejandro MullingPinkerton, Debra on 01/18/2016 14:07:35 [name], Sibel L. ([id number]) -------------------------------------------------------------------------------- Wound Assessment Details Patient Name: [name], Brande L. Date of Service: 01/18/2016 1:30 PM Medical Record Number: [id number] Patient Account Number: [id number] Date of Birth/Sex: 11/09/1924 (82 y.o. Female) Treating RN: Ashok CordiaPinkerton, Debi Primary Care Physician: Rolm GalaGrandis, Heidi Other Clinician: Referring Physician: Rolm GalaGrandis, Heidi Treating Physician/Extender: Rudene ReBritto, Errol Weeks in Treatment: 18 Wound Status Wound Number: 1 Primary Pressure Ulcer Etiology: Wound Location: Right Calcaneus Wound Status: Open Wounding Event: Pressure Injury Comorbid Deep Vein Thrombosis, Date Acquired: 07/14/2015 History: Hypertension, Dementia Weeks Of Treatment: 18 Clustered Wound: No Photos Photo Uploaded By: Alejandro MullingPinkerton, Debra on 01/18/2016 14:58:37 Wound Measurements Length: (cm) 0 Width: (cm) 0 Depth: (cm) 0 Area: (cm) 0 Volume: (cm) 0 % Reduction in Area: 100% % Reduction in Volume: 100% Epithelialization: Large (67-100%) Tunneling: No Undermining: No Wound Description Classification: Category/Stage  III Wound Margin: Flat and Intact Exudate Amount: None Present Foul Odor After Cleansing: No Wound Bed Granulation Amount:  None Present (0%) Exposed Structure Necrotic Amount: None Present (0%) Fascia Exposed: No Fat Layer Exposed: No Tendon Exposed: No Muscle Exposed: No Joint Exposed: No Ambrosio, Bonna L. ([id number]) Bone Exposed: No Limited to Skin Breakdown Periwound Skin Texture Texture Color No Abnormalities Noted: No No Abnormalities Noted: No Callus: No Atrophie Blanche: No Crepitus: No Cyanosis: No Excoriation: No Ecchymosis: No Fluctuance: No Erythema: No Friable: No Hemosiderin Staining: No Induration: No Mottled: No Localized Edema: No Pallor: No Rash: No Rubor: No Scarring: No Temperature / Pain Moisture Temperature: No Abnormality No Abnormalities Noted: No Tenderness on Palpation: Yes Dry / Scaly: No Maceration: No Moist: No Wound Preparation Ulcer Cleansing: Rinsed/Irrigated with Saline Topical Anesthetic Applied: None Electronic Signature(s) Signed: 01/18/2016 4:30:33 PM By: Alejandro MullingPinkerton, Debra Entered By: Alejandro MullingPinkerton, Debra on 01/18/2016 14:03:21 [name], Seham L. ([id number]) -------------------------------------------------------------------------------- Vitals Details Patient Name: [name], [place] L. Date of Service: 01/18/2016 1:30 PM Medical Record Number: [id number] Patient Account Number: [id number] Date of Birth/Sex: 06/18/1924 (82 y.o. Female) Treating RN: Ashok CordiaPinkerton, Debi Primary Care Physician: Rolm GalaGrandis, Heidi Other Clinician: Referring Physician: Rolm GalaGrandis, Heidi Treating Physician/Extender: Rudene ReBritto, Errol Weeks in Treatment: 18 Vital Signs Time Taken: 13:49 Pulse (bpm): 86 Height (in): 59 Respiratory Rate (breaths/min): 16 Weight (lbs): 108 Blood Pressure (mmHg): 154/83 Body Mass Index (BMI): 21.8 Reference Range: 80 - 120 mg / dl Electronic Signature(s) Signed: 01/18/2016 4:30:33 PM By: Alejandro MullingPinkerton, Debra Entered By: Alejandro MullingPinkerton, Debra on 01/18/2016 13:49:53

## 2016-01-19 NOTE — Progress Notes (Addendum)
Colleen, Chavez ([id number]) Visit Report for 01/18/2016 Chief Complaint Document Details Patient Name: Colleen, Chavez Date of Service: 01/18/2016 1:30 PM Medical Record Number: [id number] Patient Account Number: [id number] Date of Birth/Sex: February 08, 1924 (82 y.o. Female) Treating RN: Phillis Haggis Primary Care Physician: Rolm Gala Other Clinician: Referring Physician: Rolm Gala Treating Physician/Extender: Rudene Re in Treatment: 52 Information Obtained from: Patient Chief Complaint patient presents to the clinic for evaluation of right heel stage III pressure ulcer Electronic Signature(s) Signed: 01/18/2016 2:06:28 PM By: Evlyn Kanner MD, FACS Entered By: Evlyn Kanner on 01/18/2016 14:06:28 [name] ([id number]) -------------------------------------------------------------------------------- HPI Details Patient Name: [name] Date of Service: 01/18/2016 1:30 PM Medical Record Number: [id number] Patient Account Number: [id number] Date of Birth/Sex: [birth year] (82 y.o. Female) Treating RN: Phillis Haggis Primary Care Physician: Rolm Gala Other Clinician: Referring Physician: Rolm Gala Treating Physician/Extender: Rudene Re in Treatment: 18 History of Present Illness Severity: . HPI Description: 82 year old patient has been referred to as by her PCP for a pressure ulcer of the right heel. his had this for about 8 weeks Has been treated with duoderm. Has been there for a while it's getting more painful and larger and she also was seen for a recent urinary tract infection. She was given ciprofloxacin for this and then changed on to nitrofurantoin. She has a past medical history of rheumatic fever, hyperlipidemia, hypertension, scoliosis and osteopenia. never been a smoker. 10/05/2015 -- x-ray of the right heel -- IMPRESSION:No osseous finding of significance. Abnormal appearance of the soft tissues posterior to the heel  which could be due to infection or trauma. I do not see an intact Achilles tendon shadow. 12/07/2015 -- the patient has been approved for grafix and we will get this ready for her next week. 12/14/2015 -- she is here for her first application of Grafix. 12/21/2015 - the cause of the holiday we will change her application of Grafix to Monday and will order 16mm size for this day 01-04-16 [name] presents today with family for evaluation of her right heel stage III pressure ulcer. She nor the family admits to any complications or concerns since her last appointment. They state that she has been wearing the offloading boot while in bed and in the recliner. They have been using PolyMem. Electronic Signature(s) Signed: 01/18/2016 2:06:31 PM By: Evlyn Kanner MD, FACS Entered By: Evlyn Kanner on 01/18/2016 14:06:31 [name] ([id number]) -------------------------------------------------------------------------------- Physical Exam Details Patient Name: [name] Date of Service: 01/18/2016 1:30 PM Medical Record Number: [id number] Patient Account Number: [id number] Date of Birth/Sex: [birth year] (82 y.o. Female) Treating RN: Phillis Haggis Primary Care Physician: Rolm Gala Other Clinician: Referring Physician: Rolm Gala Treating Physician/Extender: Rudene Re in Treatment: 18 Constitutional . Pulse regular. Respirations normal and unlabored. Afebrile. . Eyes Nonicteric. Reactive to light. Ears, Nose, Mouth, and Throat Lips, teeth, and gums WNL.Marland Kitchen Moist mucosa without lesions. Neck supple and nontender. No palpable supraclavicular or cervical adenopathy. Normal sized without goiter. Respiratory WNL. No retractions.. Cardiovascular Pedal Pulses WNL. No clubbing, cyanosis or edema. Lymphatic No adneopathy. No adenopathy. No adenopathy. Musculoskeletal Adexa without tenderness or enlargement.. Digits and nails w/o clubbing, cyanosis, infection,  petechiae, ischemia, or inflammatory conditions.. Integumentary (Hair, Skin) No suspicious lesions. No crepitus or fluctuance. No peri-wound warmth or erythema. No masses.Marland Kitchen Psychiatric Judgement and insight Intact.. No evidence of depression, anxiety, or agitation.. Notes the wound is healed. She has minimal fungal infection on the lower part of her ankle and surrounding the  wound and we will address this Electronic Signature(s) Signed: 01/18/2016 2:07:19 PM By: Evlyn Kanner MD, FACS Entered By: Evlyn Kanner on 01/18/2016 14:07:19 [name] ([id number]) -------------------------------------------------------------------------------- Physician Orders Details Patient Name: [name] Date of Service: 01/18/2016 1:30 PM Medical Record Number: [id number] Patient Account Number: [id number] Date of Birth/Sex: Jul 30, 1924 (82 y.o. Female) Treating RN: Phillis Haggis Primary Care Physician: Rolm Gala Other Clinician: Referring Physician: Rolm Gala Treating Physician/Extender: Rudene Re in Treatment: 7 Verbal / Phone Orders: Yes Clinician: Ashok Cordia, Debi Read Back and Verified: Yes Diagnosis Coding Home Health o D/C Home Health Services - wound healed Discharge From [facility] Services o Discharge from Wound Care Center - Keep area clean and dry. Place a piece of drawtex on the area where the wound is healed and then rub fungal cream around the area then place on a gauze and kerlix to keep the pt from scratching area. Please call our office if you have any questions or concerns. Electronic Signature(s) Signed: 01/18/2016 3:42:21 PM By: Evlyn Kanner MD, FACS Signed: 01/18/2016 4:30:33 PM By: Alejandro Mulling Entered By: Alejandro Mulling on 01/18/2016 14:06:28 [name] ([id number]) -------------------------------------------------------------------------------- Problem List Details Patient Name: [name] Date of Service: 01/18/2016  1:30 PM Medical Record Number: [id number] Patient Account Number: [id number] Date of Birth/Sex: 07/24/1924 (82 y.o. Female) Treating RN: Phillis Haggis Primary Care Physician: Rolm Gala Other Clinician: Referring Physician: Rolm Gala Treating Physician/Extender: Rudene Re in Treatment: 57 Active Problems ICD-10 Encounter Code Description Active Date Diagnosis L89.613 Pressure ulcer of right heel, stage 3 09/14/2015 Yes E44.1 Mild protein-calorie malnutrition 09/14/2015 Yes Inactive Problems Resolved Problems Electronic Signature(s) Signed: 01/18/2016 2:06:01 PM By: Evlyn Kanner MD, FACS Entered By: Evlyn Kanner on 01/18/2016 14:06:00 [name] ([id number]) -------------------------------------------------------------------------------- Progress Note Details Patient Name: [name] Date of Service: 01/18/2016 1:30 PM Medical Record Number: [id number] Patient Account Number: [id number] Date of Birth/Sex: [birth year] (82 y.o. Female) Treating RN: Phillis Haggis Primary Care Physician: Rolm Gala Other Clinician: Referring Physician: Rolm Gala Treating Physician/Extender: Rudene Re in Treatment: 21 Subjective Chief Complaint Information obtained from Patient patient presents to the clinic for evaluation of right heel stage III pressure ulcer History of Present Illness (HPI) The following HPI elements were documented for the patient's wound: Severity: . 82 year old patient has been referred to as by her PCP for a pressure ulcer of the right heel. his had this for about 8 weeks Has been treated with duoderm. Has been there for a while it's getting more painful and larger and she also was seen for a recent urinary tract infection. She was given ciprofloxacin for this and then changed on to nitrofurantoin. She has a past medical history of rheumatic fever, hyperlipidemia, hypertension, scoliosis and osteopenia. never been a  smoker. 10/05/2015 -- x-ray of the right heel -- IMPRESSION:No osseous finding of significance. Abnormal appearance of the soft tissues posterior to the heel which could be due to infection or trauma. I do not see an intact Achilles tendon shadow. 12/07/2015 -- the patient has been approved for grafix and we will get this ready for her next week. 12/14/2015 -- she is here for her first application of Grafix. 12/21/2015 - the cause of the holiday we will change her application of Grafix to Monday and will order 16mm size for this day 01-04-16 [name] presents today with family for evaluation of her right heel stage III pressure ulcer. She nor the family admits to any complications or concerns since her  last appointment. They state that she has been wearing the offloading boot while in bed and in the recliner. They have been using PolyMem. Objective Constitutional Pulse regular. Respirations normal and unlabored. Afebrile. Placido, Mariela LMarland Kitchen. ([id number]) Vitals Time Taken: 1:49 PM, Height: 59 in, Weight: 108 lbs, BMI: 21.8, Pulse: 86 bpm, Respiratory Rate: 16 breaths/min, Blood Pressure: 154/83 mmHg. Eyes Nonicteric. Reactive to light. Ears, Nose, Mouth, and Throat Lips, teeth, and gums WNL.Marland Kitchen. Moist mucosa without lesions. Neck supple and nontender. No palpable supraclavicular or cervical adenopathy. Normal sized without goiter. Respiratory WNL. No retractions.. Cardiovascular Pedal Pulses WNL. No clubbing, cyanosis or edema. Lymphatic No adneopathy. No adenopathy. No adenopathy. Musculoskeletal Adexa without tenderness or enlargement.. Digits and nails w/o clubbing, cyanosis, infection, petechiae, ischemia, or inflammatory conditions.Marland Kitchen. Psychiatric Judgement and insight Intact.. No evidence of depression, anxiety, or agitation.. General Notes: the wound is healed. She has minimal fungal infection on the lower part of her ankle and surrounding the wound and we will address  this Integumentary (Hair, Skin) No suspicious lesions. No crepitus or fluctuance. No peri-wound warmth or erythema. No masses.. Wound #1 status is Open. Original cause of wound was Pressure Injury. The wound is located on the Right Calcaneus. The wound measures 0cm length x 0cm width x 0cm depth; 0cm^2 area and 0cm^3 volume. The wound is limited to skin breakdown. There is no tunneling or undermining noted. There is a none present amount of drainage noted. The wound margin is flat and intact. There is no granulation within the wound bed. There is no necrotic tissue within the wound bed. The periwound skin appearance did not exhibit: Callus, Crepitus, Excoriation, Fluctuance, Friable, Induration, Localized Edema, Rash, Scarring, Dry/Scaly, Maceration, Moist, Atrophie Blanche, Cyanosis, Ecchymosis, Hemosiderin Staining, Mottled, Pallor, Rubor, Erythema. Periwound temperature was noted as No Abnormality. The periwound has tenderness on palpation. Colleen QualeSLEY, [name]. ([id number]) Assessment Active Problems ICD-10 L89.613 - Pressure ulcer of right heel, stage 3 E44.1 - Mild protein-calorie malnutrition Plan Home Health: D/C Home Health Services - wound healed [facility] from [facility] Care Center - Keep area clean and dry. Place a piece of drawtex on the area where the wound is healed and then rub fungal cream around the area then place on a gauze and kerlix to keep the pt from scratching area. Please call our office if you have any questions or concerns. her wound is healed and I have instructed her family member how to protect this wound over the next couple of weeks. We will use a piece of Gore-Tex and sparingly apply some antifungal ointment on the lower ankle and lateral heel. She is discharged from the wound care services and will be seen back only if needed Electronic Signature(s) Signed: 01/18/2016 2:07:55 PM By: Evlyn KannerBritto, Burnie Therien MD, FACS Entered By: Evlyn KannerBritto, Corderius Saraceni  on 01/18/2016 14:07:55 [name], [name]. ([id number]) -------------------------------------------------------------------------------- SuperBill Details Patient Name: [name], [name]. Date of Service: 01/18/2016 Medical Record Number: [id number] Patient Account Number: [id number] Date of Birth/Sex: 05/29/1924 (82 y.o. Female) Treating RN: Phillis HaggisPinkerton, Debi Primary Care Physician: Rolm GalaGrandis, Heidi Other Clinician: Referring Physician: Rolm GalaGrandis, Heidi Treating Physician/Extender: Rudene ReBritto, Porfiria Heinrich Weeks in Treatment: 18 Diagnosis Coding ICD-10 Codes Code Description [phone] Pressure ulcer of right heel, stage 3 E44.1 Mild protein-calorie malnutrition Facility Procedures CPT4 Code: [id number] Description: [phone] - WOUND CARE VISIT-LEV 2 EST PT Modifier: Quantity: 1 Physician Procedures CPT4 Code: [id number] Description: [id number] - WC PHYS LEVEL 2 - EST PT ICD-10 Description Diagnosis L89.613 Pressure ulcer of right heel, stage 3  E44.1 Mild protein-calorie malnutrition Modifier: Quantity: 1 Electronic Signature(s) Signed: 01/18/2016 3:42:21 PM By: Evlyn KannerBritto, Lemarcus Baggerly MD, FACS Signed: 01/18/2016 4:30:33 PM By: Alejandro MullingPinkerton, Debra Previous Signature: 01/18/2016 2:08:05 PM Version By: Evlyn KannerBritto, Oluwademilade Mckiver MD, FACS Entered By: Alejandro MullingPinkerton, Debra on 01/18/2016 15:35:07

## 2016-01-25 ENCOUNTER — Ambulatory Visit
Admission: RE | Admit: 2016-01-25 | Discharge: 2016-01-25 | Disposition: A | Discharge status: Home / Self Care | Payer: Medicare Other | Source: Ambulatory Visit | Attending: Family Medicine | Admitting: Family Medicine

## 2016-01-25 ENCOUNTER — Other Ambulatory Visit: Payer: Self-pay | Admitting: Family Medicine

## 2016-01-25 DIAGNOSIS — R1084 Generalized abdominal pain: Secondary | ICD-10-CM | POA: Diagnosis present

## 2016-01-25 DIAGNOSIS — N939 Abnormal uterine and vaginal bleeding, unspecified: Secondary | ICD-10-CM

## 2016-01-25 DIAGNOSIS — Z96641 Presence of right artificial hip joint: Secondary | ICD-10-CM | POA: Diagnosis not present

## 2016-01-25 DIAGNOSIS — K449 Diaphragmatic hernia without obstruction or gangrene: Secondary | ICD-10-CM | POA: Insufficient documentation

## 2016-01-25 DIAGNOSIS — Z90710 Acquired absence of both cervix and uterus: Secondary | ICD-10-CM | POA: Insufficient documentation

## 2016-01-25 DIAGNOSIS — K5730 Diverticulosis of large intestine without perforation or abscess without bleeding: Secondary | ICD-10-CM | POA: Insufficient documentation

## 2016-01-25 LAB — POCT I-STAT CREATININE: Creatinine, Ser: 0.9 mg/dL (ref 0.44–1.00)

## 2016-01-25 MED ORDER — IOPAMIDOL (ISOVUE-300) INJECTION 61%
75.0000 mL | Freq: Once | INTRAVENOUS | Status: AC | PRN
  Administered 2016-01-25: 75 mL via INTRAVENOUS

## 2016-02-01 ENCOUNTER — Emergency Department
Admission: EM | Admit: 2016-02-01 | Discharge: 2016-02-02 | Disposition: A | Discharge status: Home / Self Care | Payer: Medicare Other | Attending: Emergency Medicine | Admitting: Emergency Medicine

## 2016-02-01 ENCOUNTER — Emergency Department: Payer: Medicare Other

## 2016-02-01 DIAGNOSIS — I10 Essential (primary) hypertension: Secondary | ICD-10-CM | POA: Insufficient documentation

## 2016-02-01 DIAGNOSIS — R531 Weakness: Secondary | ICD-10-CM | POA: Diagnosis present

## 2016-02-01 DIAGNOSIS — Z7982 Long term (current) use of aspirin: Secondary | ICD-10-CM | POA: Insufficient documentation

## 2016-02-01 DIAGNOSIS — R05 Cough: Secondary | ICD-10-CM | POA: Diagnosis not present

## 2016-02-01 DIAGNOSIS — Z79899 Other long term (current) drug therapy: Secondary | ICD-10-CM | POA: Diagnosis not present

## 2016-02-01 DIAGNOSIS — N179 Acute kidney failure, unspecified: Secondary | ICD-10-CM | POA: Insufficient documentation

## 2016-02-01 LAB — CBC
HEMATOCRIT: 37.8 % (ref 35.0–47.0)
Hemoglobin: 12.4 g/dL (ref 12.0–16.0)
MCH: 27.8 pg (ref 26.0–34.0)
MCHC: 32.9 g/dL (ref 32.0–36.0)
MCV: 84.4 fL (ref 80.0–100.0)
Platelets: 285 10*3/uL (ref 150–440)
RBC: 4.48 MIL/uL (ref 3.80–5.20)
RDW: 15.4 % — AB (ref 11.5–14.5)
WBC: 14.2 10*3/uL — AB (ref 3.6–11.0)

## 2016-02-01 LAB — BASIC METABOLIC PANEL
Anion gap: 7 (ref 5–15)
BUN: 18 mg/dL (ref 6–20)
CO2: 29 mmol/L (ref 22–32)
CREATININE: 1.1 mg/dL — AB (ref 0.44–1.00)
Calcium: 9.6 mg/dL (ref 8.9–10.3)
Chloride: 102 mmol/L (ref 101–111)
GFR calc Af Amer: 50 mL/min — ABNORMAL LOW (ref >60)
GFR calc non Af Amer: 43 mL/min — ABNORMAL LOW (ref >60)
Glucose, Bld: 144 mg/dL — ABNORMAL HIGH (ref 65–99)
POTASSIUM: 4.1 mmol/L (ref 3.5–5.1)
SODIUM: 138 mmol/L (ref 135–145)

## 2016-02-01 MED ORDER — SODIUM CHLORIDE 0.9 % IV BOLUS (SEPSIS)
1000.0000 mL | Freq: Once | INTRAVENOUS | Status: AC
  Administered 2016-02-02: 1000 mL via INTRAVENOUS

## 2016-02-01 NOTE — ED Triage Notes (Signed)
Pt arrives to ED via [facility] from home with c/o weakness. EMS states [facility] and called today with results of a UTI and rx'd ABX; EMS states pt took 1st dose today. Pt presents with generalized weakness and cough "from a cold". Pt with no c/o pain at this time.

## 2016-02-02 DIAGNOSIS — N179 Acute kidney failure, unspecified: Secondary | ICD-10-CM | POA: Diagnosis not present

## 2016-02-02 LAB — URINALYSIS, COMPLETE (UACMP) WITH MICROSCOPIC
BILIRUBIN URINE: NEGATIVE
Glucose, UA: NEGATIVE mg/dL
KETONES UR: NEGATIVE mg/dL
Nitrite: NEGATIVE
PROTEIN: 30 mg/dL — AB
Specific Gravity, Urine: 1.009 (ref 1.005–1.030)
pH: 6 (ref 5.0–8.0)

## 2016-02-02 LAB — TROPONIN I

## 2016-02-02 LAB — INFLUENZA PANEL BY PCR (TYPE A & B)
INFLAPCR: NEGATIVE
Influenza B By PCR: NEGATIVE

## 2016-02-02 MED ORDER — SODIUM CHLORIDE 0.9 % IV BOLUS (SEPSIS)
1000.0000 mL | Freq: Once | INTRAVENOUS | Status: AC
  Administered 2016-02-02: 1000 mL via INTRAVENOUS

## 2016-02-02 NOTE — ED Notes (Signed)
In and Out Cath performed not able to obtain specimen at this time, RN notified.

## 2016-02-02 NOTE — ED Provider Notes (Addendum)
[facility] Emergency Department Provider Note  ____________________________________________  Time seen: Approximately 12:07 AM  I have reviewed the triage vital signs and the nursing notes.   HISTORY  Chief Complaint Fatigue   HPI [name] is a 82 y.o. female with a history of dementia and hypertension who presents for evaluation of generalized weakness and fatigue. According to the family patient has had cough and congestion for 5 days, no chills or fever. Today the family noticed the patient started becoming more somnolent and very weak which prompted the visit to the emergency room. Patient was seen at [facility] 9 days ago for vaginal bleeding which she followed with her PCP and was taken off of Eliquis. No longer having bleeding. At that visit she had UA that was positive for urinary tract infection. According to the family they only received a phone call yesterday saying that she had a UTI and patient has taken 3 doses of Bactrim so far. Patient denies any pain at this time, no chest pain, no shortness of breath, no abdominal pain, no headache, no flank pain, no nausea, no vomiting. She has had diarrhea which per family is a chronic problem for her. Patient has had decreased appetite over the last few days. Her vaccines are up-to-date including influenza.  Past Medical History:  Diagnosis Date  . Anterolisthesis   . Blood in stool   . Dementia   . Eczema   . Hematuria   . Hypertension   . IBS (irritable bowel syndrome)   . Insomnia   . LVH (left ventricular hypertrophy) due to hypertensive disease   . Osteopenia   . Urethra, polyp   . Vaginal bleeding     Patient Active Problem List   Diagnosis Date Noted  . Pressure ulcer 07/28/2015  . Closed right hip fracture (HCC) 07/22/2015  . Hip fracture (HCC) 07/22/2015  . Acute pulmonary embolism (HCC) 02/09/2015  . Malnutrition of moderate degree 02/07/2015  . Hypokalemia 02/05/2015  . SPL  (spondylolisthesis) 07/28/2014  . Osteopenia 07/28/2014  . Poor balance 07/28/2014  . Adynamia 07/28/2014  . Hypertensive left ventricular hypertrophy 12/21/2013  . Disorder of rotator cuff 07/09/2013  . Dementia 01/25/2013  . Regurgitation, of nonorganic origin, of food with reswallowing 01/25/2013  . Blood pressure elevated 12/28/2012  . Amnesia 11/19/2010  . Dermatitis, eczematoid 08/20/2010  . Difficulty hearing 08/20/2010  . Adaptive colitis 08/20/2010  . Cannot sleep 08/20/2010  . Affective disorder (HCC) 08/20/2010    Past Surgical History:  Procedure Laterality Date  . HIP ARTHROPLASTY Right 07/26/2015   Procedure: ARTHROPLASTY BIPOLAR HIP (HEMIARTHROPLASTY);  Surgeon: Juanell Fairly, MD;  Location: [facility] ORS;  Service: Orthopedics;  Laterality: Right;    Prior to Admission medications   Medication Sig Start Date End Date Taking? Authorizing Provider  acetaminophen (TYLENOL) 325 MG tablet Take 2 tablets (650 mg total) by mouth every 6 (six) hours as needed for mild pain (or Fever >/= 101). 07/28/15   Ramonita Lab, MD  apixaban (ELIQUIS) 5 MG TABS tablet Take 1 tablet (5 mg total) by mouth 2 (two) times daily. 02/14/15   Gale Journey, MD  aspirin 81 MG chewable tablet Chew by mouth.    Historical Provider, MD  cyanocobalamin (,VITAMIN B-12,) 1000 MCG/ML injection Inject 1,000 mcg into the muscle every 30 (thirty) days.    Historical Provider, MD  dicyclomine (BENTYL) 20 MG tablet Take 0.5 mg by mouth 3 (three) times daily as needed for spasms.  Historical Provider, MD  folic acid (FOLVITE) 1 MG tablet Take 1 mg by mouth daily.     Historical Provider, MD  ipratropium-albuterol (DUONEB) 0.5-2.5 (3) MG/3ML SOLN Take 3 mLs by nebulization every 6 (six) hours as needed. 07/28/15   Ramonita LabAruna Gouru, MD  Iron-Vitamins (GERITOL COMPLETE) TABS Take 1 tablet by mouth daily.     Historical Provider, MD  ketoconazole (NIZORAL) 2 % cream Apply 1 application topically daily.     Historical  Provider, MD  levofloxacin (LEVAQUIN) 500 MG tablet Take 1 tablet (500 mg total) by mouth daily. 07/28/15   Ramonita LabAruna Gouru, MD  Lifitegrast Benay Spice(XIIDRA) 5 % SOLN Place 1 drop into both eyes 2 (two) times daily.    Historical Provider, MD  Magnesium 250 MG TABS Take 1 tablet by mouth daily.    Historical Provider, MD  memantine (NAMENDA) 10 MG tablet Take 5 mg by mouth 2 (two) times daily.  07/26/14   Historical Provider, MD  menthol-cetylpyridinium (CEPACOL) 3 MG lozenge Take 1 lozenge (3 mg total) by mouth as needed for sore throat (sore throat). 07/28/15   Ramonita LabAruna Gouru, MD  methocarbamol (ROBAXIN) 500 MG tablet Take 1 tablet (500 mg total) by mouth every 8 (eight) hours as needed for muscle spasms. 07/28/15   Ramonita LabAruna Gouru, MD  metroNIDAZOLE (FLAGYL) 500 MG tablet Take 1 tablet (500 mg total) by mouth 3 (three) times daily. 07/28/15   Ramonita LabAruna Gouru, MD  mometasone-formoterol (DULERA) 100-5 MCG/ACT AERO Inhale 2 puffs into the lungs 2 (two) times daily. Patient not taking: Reported on 07/22/2015 02/09/15   Gale Journeyatherine P Walsh, MD  oxyCODONE (OXY IR/ROXICODONE) 5 MG immediate release tablet Take 1-2 tablets (5-10 mg total) by mouth every 4 (four) hours as needed for breakthrough pain ((for MODERATE breakthrough pain)). 07/28/15   Ramonita LabAruna Gouru, MD  PARoxetine (PAXIL) 10 MG tablet Take 10 mg by mouth daily.     Historical Provider, MD  potassium chloride (K-DUR,KLOR-CON) 10 MEQ tablet Take 1 tablet (10 mEq total) by mouth daily. For five days starting sunday 07/30/15   Alford Highlandichard Wieting, MD  QUEtiapine (SEROQUEL) 25 MG tablet Take 25 mg by mouth at bedtime.    Historical Provider, MD  saccharomyces boulardii (FLORASTOR) 250 MG capsule Take 1 capsule (250 mg total) by mouth 2 (two) times daily. 07/28/15   Ramonita LabAruna Gouru, MD  senna (SENOKOT) 8.6 MG TABS tablet Take 1 tablet (8.6 mg total) by mouth 2 (two) times daily. 07/28/15   Ramonita LabAruna Gouru, MD    Allergies Azithromycin; Bactrim [sulfamethoxazole-trimethoprim]; Other; Penicillins;  Sulfa antibiotics; and Vibramycin [doxycycline calcium]  No family history on file.  Social History Social History  Substance Use Topics  . Smoking status: Never Smoker  . Smokeless tobacco: Never Used  . Alcohol use No    Review of Systems  Constitutional: Negative for fever. + generalized weakness and fatigue Eyes: Negative for visual changes. ENT: Negative for sore throat. Neck: No neck pain  Cardiovascular: Negative for chest pain. Respiratory: Negative for shortness of breath. Gastrointestinal: Negative for abdominal pain, vomiting or diarrhea. Genitourinary: Negative for dysuria. Musculoskeletal: Negative for back pain. Skin: Negative for rash. Neurological: Negative for headaches, weakness or numbness. Psych: No SI or HI  ____________________________________________   PHYSICAL EXAM:  VITAL SIGNS: ED Triage Vitals  Enc Vitals Group     BP 02/01/16 2302 (!) 107/58     Pulse Rate 02/01/16 2302 99     Resp 02/01/16 2302 (!) 21     Temp 02/01/16 2302 97.9  F (36.6 C)     Temp Source 02/01/16 2302 Oral     SpO2 02/01/16 2302 93 %     Weight 02/01/16 2304 130 lb (59 kg)     Height 02/01/16 2304 5\' 3"  (1.6 m)     Head Circumference --      Peak Flow --      Pain Score 02/01/16 2304 0     Pain Loc --      Pain Edu? --      Excl. in GC? --     Constitutional: Alert and oriented. Well appearing and in no apparent distress. HEENT:      Head: Normocephalic and atraumatic.         Eyes: Conjunctivae are normal. Sclera is non-icteric. EOMI. PERRL      Mouth/Throat: Mucous membranes are moist.       Neck: Supple with no signs of meningismus. Cardiovascular: Regular rate and rhythm. No murmurs, gallops, or rubs. 2+ symmetrical distal pulses are present in all extremities. No JVD. Respiratory: Normal respiratory effort. Lungs are clear to auscultation bilaterally. No wheezes, crackles, or rhonchi.  Gastrointestinal: Soft, non tender, and non distended with positive  bowel sounds. No rebound or guarding. Genitourinary: No CVA tenderness. Musculoskeletal: Nontender with normal range of motion in all extremities. No edema, cyanosis, or erythema of extremities. Neurologic: Normal speech and language. Face is symmetric. Moving all extremities. No gross focal neurologic deficits are appreciated. Skin: Skin is warm, dry and intact. No rash noted. Psychiatric: Mood and affect are normal. Speech and behavior are normal.  ____________________________________________   LABS (all labs ordered are listed, but only abnormal results are displayed)  Labs Reviewed  BASIC METABOLIC PANEL - Abnormal; Notable for the following:       Result Value   Glucose, Bld 144 (*)    Creatinine, Ser 1.10 (*)    GFR calc non Af Amer 43 (*)    GFR calc Af Amer 50 (*)    All other components within normal limits  CBC - Abnormal; Notable for the following:    WBC 14.2 (*)    RDW 15.4 (*)    All other components within normal limits  URINE CULTURE  TROPONIN I  INFLUENZA PANEL BY PCR (TYPE A & B, H1N1)  URINALYSIS, COMPLETE (UACMP) WITH MICROSCOPIC  CBG MONITORING, ED   ____________________________________________  EKG  ED ECG REPORT I, Nita Sicklearolina Sharnetta Gielow, the attending physician, personally viewed and interpreted this ECG.  Normal sinus rhythm, rate of 98, normal PR and QRS intervals, prolonged QTC, left axis deviation, no ST elevations or depressions. Unchanged from prior. ____________________________________________  RADIOLOGY  CXR: No PNA ____________________________________________   PROCEDURES  Procedure(s) performed: None Procedures Critical Care performed:  None ____________________________________________   INITIAL IMPRESSION / ASSESSMENT AND PLAN / ED COURSE  82 y.o. female with a history of dementia and hypertension who presents for evaluation of generalized weakness and fatigue. Patient had a urinalysis which was positive for Enterococcus faecalis 9  days ago however was only started on antibiotics yesterday and also has had URI symptoms for the last 5 days. Here she is well appearing and in no distress, her vital signs are within normal limits with no fever, physical exam with no acute findings. We'll check for flu, chest x-ray for pneumonia, blood work for dehydration or electrolyte abnormalities or anemia. We'll also check a urinalysis. EKG with no evidence of ischemia.  Clinical Course as of Feb 02 531  Fri Feb 02, 2016  1610 Patient received 2L of NS and feels improved and is requesting discharge home. She is being appropriately treated with Bactrim for enterococcus UTI. She does have mild leukocytosis but no fever, normal vital signs otherwise and therefore no evidence of systemic infection. Patient is now producing urine after 2 L of fluid. She wishes to go home before the results of the urinalysis. Her family members are at the bedside and also wanted take her home and are comfortable taking her home. They will be with her. We will follow the results of the urinalysis but I don't anticipate that he will change my management as patient has only been on Bactrim for 2 days. Will wait until the results of the urine culture to make any changes. We'll discharge patient home, continue Bactrim, increase by mouth intake, close follow-up with primary care doctor. Recommended close f/u with PCP for recheck creatinine in 24 hours  [CV]    Clinical Course User Index [CV] Nita Sickle, MD    Pertinent labs & imaging results that were available during my care of the patient were reviewed by me and considered in my medical decision making (see chart for details).    ____________________________________________   FINAL CLINICAL IMPRESSION(S) / ED DIAGNOSES  Final diagnoses:  Generalized weakness  AKI (acute kidney injury) (HCC)      NEW MEDICATIONS STARTED DURING THIS VISIT:  New Prescriptions   No medications on file     Note:   This document was prepared using Dragon voice recognition software and may include unintentional dictation errors.    Nita Sickle, MD 02/02/16 [id number]    Nita Sickle, MD 02/02/16 [phone]

## 2016-02-02 NOTE — Discharge Instructions (Signed)
Take lots of fluids at home to prevent you from getting dehydrated again. Continue your Bactrim as prescribed. Follow-up with her primary care doctor in 24 hours for recheck. Return to the emergency room for fever, nausea or vomiting, abdominal pain, back pain, or any new symptoms concerning to you.

## 2016-02-03 LAB — URINE CULTURE

## 2016-04-04 ENCOUNTER — Encounter: Payer: Self-pay | Admitting: Emergency Medicine

## 2016-04-04 ENCOUNTER — Emergency Department: Payer: Medicare Other

## 2016-04-04 ENCOUNTER — Inpatient Hospital Stay
Admission: EM | Admit: 2016-04-04 | Discharge: 2016-04-08 | DRG: 871 | Disposition: A | Discharge status: Skilled Nursing Facility | Payer: Medicare Other | Attending: Internal Medicine | Admitting: Internal Medicine

## 2016-04-04 DIAGNOSIS — K589 Irritable bowel syndrome without diarrhea: Secondary | ICD-10-CM | POA: Diagnosis present

## 2016-04-04 DIAGNOSIS — R531 Weakness: Secondary | ICD-10-CM | POA: Diagnosis not present

## 2016-04-04 DIAGNOSIS — I10 Essential (primary) hypertension: Secondary | ICD-10-CM | POA: Diagnosis present

## 2016-04-04 DIAGNOSIS — N179 Acute kidney failure, unspecified: Secondary | ICD-10-CM | POA: Diagnosis present

## 2016-04-04 DIAGNOSIS — N3000 Acute cystitis without hematuria: Secondary | ICD-10-CM | POA: Diagnosis present

## 2016-04-04 DIAGNOSIS — F0280 Dementia in other diseases classified elsewhere without behavioral disturbance: Secondary | ICD-10-CM | POA: Diagnosis present

## 2016-04-04 DIAGNOSIS — Z66 Do not resuscitate: Secondary | ICD-10-CM | POA: Diagnosis present

## 2016-04-04 DIAGNOSIS — G309 Alzheimer's disease, unspecified: Secondary | ICD-10-CM | POA: Diagnosis present

## 2016-04-04 DIAGNOSIS — G9341 Metabolic encephalopathy: Secondary | ICD-10-CM | POA: Diagnosis present

## 2016-04-04 DIAGNOSIS — A419 Sepsis, unspecified organism: Secondary | ICD-10-CM | POA: Diagnosis not present

## 2016-04-04 DIAGNOSIS — Z96641 Presence of right artificial hip joint: Secondary | ICD-10-CM | POA: Diagnosis present

## 2016-04-04 DIAGNOSIS — G4700 Insomnia, unspecified: Secondary | ICD-10-CM | POA: Diagnosis present

## 2016-04-04 DIAGNOSIS — E860 Dehydration: Secondary | ICD-10-CM | POA: Diagnosis present

## 2016-04-04 DIAGNOSIS — I248 Other forms of acute ischemic heart disease: Secondary | ICD-10-CM | POA: Diagnosis present

## 2016-04-04 DIAGNOSIS — M810 Age-related osteoporosis without current pathological fracture: Secondary | ICD-10-CM | POA: Diagnosis present

## 2016-04-04 DIAGNOSIS — J189 Pneumonia, unspecified organism: Secondary | ICD-10-CM | POA: Diagnosis present

## 2016-04-04 DIAGNOSIS — Z79899 Other long term (current) drug therapy: Secondary | ICD-10-CM | POA: Diagnosis not present

## 2016-04-04 DIAGNOSIS — E875 Hyperkalemia: Secondary | ICD-10-CM | POA: Diagnosis present

## 2016-04-04 DIAGNOSIS — Z86711 Personal history of pulmonary embolism: Secondary | ICD-10-CM | POA: Diagnosis not present

## 2016-04-04 DIAGNOSIS — Z7951 Long term (current) use of inhaled steroids: Secondary | ICD-10-CM

## 2016-04-04 DIAGNOSIS — R6520 Severe sepsis without septic shock: Secondary | ICD-10-CM

## 2016-04-04 DIAGNOSIS — I214 Non-ST elevation (NSTEMI) myocardial infarction: Secondary | ICD-10-CM

## 2016-04-04 LAB — COMPREHENSIVE METABOLIC PANEL
ALT: 9 U/L — ABNORMAL LOW (ref 14–54)
AST: 27 U/L (ref 15–41)
Albumin: 4.3 g/dL (ref 3.5–5.0)
Alkaline Phosphatase: 85 U/L (ref 38–126)
Anion gap: 10 (ref 5–15)
BILIRUBIN TOTAL: 0.7 mg/dL (ref 0.3–1.2)
BUN: 23 mg/dL — AB (ref 6–20)
CO2: 19 mmol/L — ABNORMAL LOW (ref 22–32)
Calcium: 9.3 mg/dL (ref 8.9–10.3)
Chloride: 111 mmol/L (ref 101–111)
Creatinine, Ser: 1.31 mg/dL — ABNORMAL HIGH (ref 0.44–1.00)
GFR, EST AFRICAN AMERICAN: 40 mL/min — AB (ref >60)
GFR, EST NON AFRICAN AMERICAN: 35 mL/min — AB (ref >60)
Glucose, Bld: 144 mg/dL — ABNORMAL HIGH (ref 65–99)
POTASSIUM: 6.5 mmol/L — AB (ref 3.5–5.1)
Sodium: 140 mmol/L (ref 135–145)
TOTAL PROTEIN: 8.7 g/dL — AB (ref 6.5–8.1)

## 2016-04-04 LAB — CBC WITH DIFFERENTIAL/PLATELET
BASOS PCT: 0 %
Basophils Absolute: 0.1 10*3/uL (ref 0–0.1)
EOS ABS: 0.1 10*3/uL (ref 0–0.7)
Eosinophils Relative: 1 %
HCT: 44.5 % (ref 35.0–47.0)
HEMOGLOBIN: 14.7 g/dL (ref 12.0–16.0)
Lymphocytes Relative: 11 %
Lymphs Abs: 1.6 10*3/uL (ref 1.0–3.6)
MCH: 27.6 pg (ref 26.0–34.0)
MCHC: 33.1 g/dL (ref 32.0–36.0)
MCV: 83.4 fL (ref 80.0–100.0)
MONO ABS: 0.7 10*3/uL (ref 0.2–0.9)
MONOS PCT: 5 %
NEUTROS PCT: 83 %
Neutro Abs: 11.6 10*3/uL — ABNORMAL HIGH (ref 1.4–6.5)
Platelets: 362 10*3/uL (ref 150–440)
RBC: 5.34 MIL/uL — ABNORMAL HIGH (ref 3.80–5.20)
RDW: 16.3 % — AB (ref 11.5–14.5)
WBC: 14.1 10*3/uL — ABNORMAL HIGH (ref 3.6–11.0)

## 2016-04-04 LAB — LIPASE, BLOOD: LIPASE: 41 U/L (ref 11–51)

## 2016-04-04 LAB — URINALYSIS, ROUTINE W REFLEX MICROSCOPIC
BACTERIA UA: NONE SEEN
Bilirubin Urine: NEGATIVE
Glucose, UA: NEGATIVE mg/dL
Hgb urine dipstick: NEGATIVE
Ketones, ur: NEGATIVE mg/dL
Nitrite: NEGATIVE
Protein, ur: NEGATIVE mg/dL
SPECIFIC GRAVITY, URINE: 1.008 (ref 1.005–1.030)
SQUAMOUS EPITHELIAL / LPF: NONE SEEN
pH: 5 (ref 5.0–8.0)

## 2016-04-04 LAB — TROPONIN I
Troponin I: 0.26 ng/mL (ref <0.03)
Troponin I: 0.21 ng/mL (ref <0.03)

## 2016-04-04 LAB — LACTIC ACID, PLASMA
LACTIC ACID, VENOUS: 2 mmol/L — AB (ref 0.5–1.9)
Lactic Acid, Venous: 2.8 mmol/L (ref 0.5–1.9)
Lactic Acid, Venous: 1.9 mmol/L (ref 0.5–1.9)

## 2016-04-04 LAB — PROTIME-INR
INR: 0.99
PROTHROMBIN TIME: 13.1 s (ref 11.4–15.2)

## 2016-04-04 LAB — INFLUENZA PANEL BY PCR (TYPE A & B)
INFLAPCR: NEGATIVE
INFLBPCR: NEGATIVE

## 2016-04-04 LAB — PROCALCITONIN

## 2016-04-04 LAB — MRSA PCR SCREENING: MRSA BY PCR: POSITIVE — AB

## 2016-04-04 LAB — POTASSIUM: Potassium: 5.1 mmol/L (ref 3.5–5.1)

## 2016-04-04 MED ORDER — ACETAMINOPHEN 325 MG PO TABS
650.0000 mg | ORAL_TABLET | Freq: Four times a day (QID) | ORAL | Status: DC | PRN
  Administered 2016-04-04 – 2016-04-06 (×3): 650 mg via ORAL
  Filled 2016-04-04 (×4): qty 2

## 2016-04-04 MED ORDER — QUETIAPINE FUMARATE 25 MG PO TABS
37.5000 mg | ORAL_TABLET | Freq: Every day | ORAL | Status: DC
  Administered 2016-04-04 – 2016-04-07 (×4): 37.5 mg via ORAL
  Filled 2016-04-04 (×4): qty 2

## 2016-04-04 MED ORDER — FOLIC ACID 1 MG PO TABS
1.0000 mg | ORAL_TABLET | Freq: Every day | ORAL | Status: DC
  Administered 2016-04-04 – 2016-04-08 (×5): 1 mg via ORAL
  Filled 2016-04-04 (×5): qty 1

## 2016-04-04 MED ORDER — LEVOFLOXACIN IN D5W 750 MG/150ML IV SOLN
750.0000 mg | Freq: Once | INTRAVENOUS | Status: AC
  Administered 2016-04-04: 750 mg via INTRAVENOUS
  Filled 2016-04-04: qty 150

## 2016-04-04 MED ORDER — ONDANSETRON HCL 4 MG/2ML IJ SOLN: 4.0000 mg | Freq: Four times a day (QID) | INTRAMUSCULAR | Status: DC | PRN

## 2016-04-04 MED ORDER — INSULIN REGULAR BOLUS VIA INFUSION: 5.0000 [IU] | Freq: Once | INTRAVENOUS | Status: DC

## 2016-04-04 MED ORDER — CYANOCOBALAMIN 1000 MCG/ML IJ SOLN: 1000.0000 ug | INTRAMUSCULAR | Status: DC

## 2016-04-04 MED ORDER — SODIUM CHLORIDE 0.9 % IV SOLN: 1.0000 g | Freq: Once | INTRAVENOUS | Status: DC

## 2016-04-04 MED ORDER — DEXTROSE 50 % IV SOLN
1.0000 | Freq: Once | INTRAVENOUS | Status: AC
  Administered 2016-04-04: 50 mL via INTRAVENOUS
  Filled 2016-04-04: qty 50

## 2016-04-04 MED ORDER — MAGNESIUM OXIDE 400 (241.3 MG) MG PO TABS
400.0000 mg | ORAL_TABLET | Freq: Every day | ORAL | Status: DC
  Administered 2016-04-04 – 2016-04-08 (×5): 400 mg via ORAL
  Filled 2016-04-04 (×5): qty 1

## 2016-04-04 MED ORDER — INSULIN ASPART 100 UNIT/ML ~~LOC~~ SOLN
SUBCUTANEOUS | Status: AC
  Filled 2016-04-04: qty 5

## 2016-04-04 MED ORDER — KETOCONAZOLE 2 % EX CREA
1.0000 "application " | TOPICAL_CREAM | Freq: Every day | CUTANEOUS | Status: DC | PRN
  Filled 2016-04-04: qty 15

## 2016-04-04 MED ORDER — CALCIUM GLUCONATE 10 % IV SOLN: 1.0000 g | Freq: Once | INTRAVENOUS | Status: DC

## 2016-04-04 MED ORDER — MEMANTINE HCL 5 MG PO TABS
5.0000 mg | ORAL_TABLET | Freq: Two times a day (BID) | ORAL | Status: DC
  Administered 2016-04-04 – 2016-04-08 (×8): 5 mg via ORAL
  Filled 2016-04-04 (×8): qty 1

## 2016-04-04 MED ORDER — SODIUM POLYSTYRENE SULFONATE 15 GM/60ML PO SUSP
30.0000 g | Freq: Once | ORAL | Status: AC
  Administered 2016-04-04: 15 g via ORAL
  Filled 2016-04-04: qty 120

## 2016-04-04 MED ORDER — SODIUM CHLORIDE 0.9 % IV SOLN
2.0000 g | Freq: Once | INTRAVENOUS | Status: AC
  Administered 2016-04-04: 2 g via INTRAVENOUS
  Filled 2016-04-04: qty 20

## 2016-04-04 MED ORDER — ENOXAPARIN SODIUM 30 MG/0.3ML ~~LOC~~ SOLN
30.0000 mg | SUBCUTANEOUS | Status: DC
  Administered 2016-04-04 – 2016-04-06 (×3): 30 mg via SUBCUTANEOUS
  Filled 2016-04-04 (×3): qty 0.3

## 2016-04-04 MED ORDER — ADULT MULTIVITAMIN W/MINERALS CH
1.0000 | ORAL_TABLET | Freq: Every day | ORAL | Status: DC
  Administered 2016-04-04 – 2016-04-08 (×5): 1 via ORAL
  Filled 2016-04-04 (×5): qty 1

## 2016-04-04 MED ORDER — IPRATROPIUM-ALBUTEROL 0.5-2.5 (3) MG/3ML IN SOLN
3.0000 mL | Freq: Once | RESPIRATORY_TRACT | Status: AC
  Administered 2016-04-04: 3 mL via RESPIRATORY_TRACT
  Filled 2016-04-04: qty 3

## 2016-04-04 MED ORDER — APIXABAN 5 MG PO TABS: 5.0000 mg | ORAL_TABLET | Freq: Two times a day (BID) | ORAL | Status: DC

## 2016-04-04 MED ORDER — CEFEPIME-DEXTROSE 2 GM/50ML IV SOLR
2.0000 g | INTRAVENOUS | Status: DC
  Administered 2016-04-04 – 2016-04-05 (×2): 2 g via INTRAVENOUS
  Filled 2016-04-04 (×4): qty 50

## 2016-04-04 MED ORDER — QUETIAPINE FUMARATE 25 MG PO TABS
12.5000 mg | ORAL_TABLET | Freq: Every day | ORAL | Status: DC
  Administered 2016-04-04 – 2016-04-08 (×5): 12.5 mg via ORAL
  Filled 2016-04-04 (×5): qty 1

## 2016-04-04 MED ORDER — ONDANSETRON HCL 4 MG PO TABS: 4.0000 mg | ORAL_TABLET | Freq: Four times a day (QID) | ORAL | Status: DC | PRN

## 2016-04-04 MED ORDER — SODIUM CHLORIDE 0.9 % IV BOLUS (SEPSIS): 650.0000 mL | Freq: Once | INTRAVENOUS | Status: DC

## 2016-04-04 MED ORDER — INSULIN REGULAR HUMAN 100 UNIT/ML IJ SOLN
5.0000 [IU] | Freq: Once | INTRAMUSCULAR | Status: DC
  Filled 2016-04-04: qty 0.05

## 2016-04-04 MED ORDER — VANCOMYCIN HCL IN DEXTROSE 750-5 MG/150ML-% IV SOLN
750.0000 mg | INTRAVENOUS | Status: DC
  Administered 2016-04-05: 750 mg via INTRAVENOUS
  Filled 2016-04-04 (×2): qty 150

## 2016-04-04 MED ORDER — SODIUM CHLORIDE 0.9 % IV SOLN
INTRAVENOUS | Status: DC
  Administered 2016-04-04 – 2016-04-06 (×3): via INTRAVENOUS

## 2016-04-04 MED ORDER — SENNA 8.6 MG PO TABS: 1.0000 | ORAL_TABLET | Freq: Two times a day (BID) | ORAL | Status: DC | PRN

## 2016-04-04 MED ORDER — ACETAMINOPHEN 325 MG PO TABS: 650.0000 mg | ORAL_TABLET | Freq: Four times a day (QID) | ORAL | Status: DC | PRN

## 2016-04-04 MED ORDER — ACETAMINOPHEN 650 MG RE SUPP: 650.0000 mg | Freq: Four times a day (QID) | RECTAL | Status: DC | PRN

## 2016-04-04 MED ORDER — SODIUM CHLORIDE 0.9 % IV BOLUS (SEPSIS)
1000.0000 mL | Freq: Once | INTRAVENOUS | Status: AC
  Administered 2016-04-04: 1000 mL via INTRAVENOUS

## 2016-04-04 MED ORDER — PAROXETINE HCL 20 MG PO TABS
10.0000 mg | ORAL_TABLET | Freq: Every day | ORAL | Status: DC
  Administered 2016-04-04 – 2016-04-08 (×5): 10 mg via ORAL
  Filled 2016-04-04 (×5): qty 1

## 2016-04-04 MED ORDER — INSULIN REGULAR HUMAN 100 UNIT/ML IJ SOLN
5.0000 [IU] | Freq: Once | INTRAMUSCULAR | Status: AC
  Administered 2016-04-04: 5 [IU] via INTRAVENOUS
  Filled 2016-04-04: qty 0.05

## 2016-04-04 MED ORDER — VANCOMYCIN HCL IN DEXTROSE 1-5 GM/200ML-% IV SOLN
1000.0000 mg | Freq: Once | INTRAVENOUS | Status: AC
  Administered 2016-04-04: 1000 mg via INTRAVENOUS
  Filled 2016-04-04: qty 200

## 2016-04-04 MED ORDER — IPRATROPIUM-ALBUTEROL 0.5-2.5 (3) MG/3ML IN SOLN
3.0000 mL | Freq: Four times a day (QID) | RESPIRATORY_TRACT | Status: DC
  Administered 2016-04-04 – 2016-04-08 (×11): 3 mL via RESPIRATORY_TRACT
  Filled 2016-04-04 (×12): qty 3

## 2016-04-04 MED ORDER — LIFITEGRAST 5 % OP SOLN
1.0000 [drp] | Freq: Two times a day (BID) | OPHTHALMIC | Status: DC
Start: 2016-04-04 — End: 2016-04-08

## 2016-04-04 NOTE — ED Notes (Signed)
Pt has healed pressure ulcer on right heel. Pt's family states she needs to have precautions taken to avoid another ulcer.

## 2016-04-04 NOTE — ED Triage Notes (Signed)
Pt via ems from home with increased lethargy today. She has been on abx x 1 month for UTI. She has been vomiting today x 3. Pt is normally more responsive, per caregiver. Pt answers questions, is oriented to self and situation, confused as to date.

## 2016-04-04 NOTE — Progress Notes (Signed)
Family Meeting Note  Advance Directive:yes  Today a meeting took place with the Patient, and daughter    The following clinical team members were present during this meeting:MD  The following were discussed:Patient's diagnosis: , Patient's progosis: Unable to determine and plan of care discussed with the patient, caregiver and daughter at bedside Goals for treatment:  DNR, not considering palliative care at this time  Additional follow-up to be provided: HOSPITALIST  Time spent during discussion:18 MIN  [name], Colleen Wandler, MD

## 2016-04-04 NOTE — Progress Notes (Signed)
SPOKE WITH DR. Amado CoeGOURU TO MAKE AWARE POTASSIUM IS NOW 5.1 AND LACTIC ACID 2.8

## 2016-04-04 NOTE — ED Provider Notes (Addendum)
[facility] Emergency Department Provider Note  ____________________________________________   First MD Initiated Contact with Patient 04/04/16 1200     (approximate)  I have reviewed the triage vital signs and the nursing notes.   HISTORY  Chief Complaint Fatigue  History Limited by the patient's dementia  HPI [name] is a 83 y.o. female who is sent to the emergency department via EMS by her home caretaker for increasing lethargy throughout the day. The patient has a past medical history of Alzheimer's dementia, chronic urinary tract infections, hypertension, and has been treated for "bronchitis" with unknown medications for the past 2 weeks.Further history is limited by the patient's altered mental status and dementia.   Past Medical History:  Diagnosis Date  . Anterolisthesis   . Blood in stool   . Dementia   . Eczema   . Hematuria   . Hypertension   . IBS (irritable bowel syndrome)   . Insomnia   . LVH (left ventricular hypertrophy) due to hypertensive disease   . Osteopenia   . Urethra, polyp   . Vaginal bleeding     Patient Active Problem List   Diagnosis Date Noted  . Hyperkalemia 04/04/2016  . Pressure ulcer 07/28/2015  . Closed right hip fracture (HCC) 07/22/2015  . Hip fracture (HCC) 07/22/2015  . Acute pulmonary embolism (HCC) 02/09/2015  . Malnutrition of moderate degree 02/07/2015  . Hypokalemia 02/05/2015  . SPL (spondylolisthesis) 07/28/2014  . Osteopenia 07/28/2014  . Poor balance 07/28/2014  . Adynamia 07/28/2014  . Hypertensive left ventricular hypertrophy 12/21/2013  . Disorder of rotator cuff 07/09/2013  . Dementia 01/25/2013  . Regurgitation, of nonorganic origin, of food with reswallowing 01/25/2013  . Blood pressure elevated 12/28/2012  . Amnesia 11/19/2010  . Dermatitis, eczematoid 08/20/2010  . Difficulty hearing 08/20/2010  . Adaptive colitis 08/20/2010  . Cannot sleep 08/20/2010  . Affective  disorder (HCC) 08/20/2010    Past Surgical History:  Procedure Laterality Date  . HIP ARTHROPLASTY Right 07/26/2015   Procedure: ARTHROPLASTY BIPOLAR HIP (HEMIARTHROPLASTY);  Surgeon: Juanell Fairly, MD;  Location: [facility] ORS;  Service: Orthopedics;  Laterality: Right;    Prior to Admission medications   Medication Sig Start Date End Date Taking? Authorizing Provider  cyanocobalamin (,VITAMIN B-12,) 1000 MCG/ML injection Inject 1,000 mcg into the muscle every 30 (thirty) days.   Yes Historical Provider, MD  folic acid (FOLVITE) 1 MG tablet Take 1 mg by mouth daily.    Yes Historical Provider, MD  Iron-Vitamins (GERITOL COMPLETE) TABS Take 1 tablet by mouth daily.    Yes Historical Provider, MD  Lifitegrast Benay Spice) 5 % SOLN Place 1 drop into both eyes 2 (two) times daily.   Yes Historical Provider, MD  Magnesium 500 MG TABS Take 1 tablet by mouth daily.   Yes Historical Provider, MD  memantine (NAMENDA) 10 MG tablet Take 5 mg by mouth 2 (two) times daily.  07/26/14  Yes Historical Provider, MD  PARoxetine (PAXIL) 10 MG tablet Take 10 mg by mouth daily.    Yes Historical Provider, MD  potassium chloride (K-DUR,KLOR-CON) 10 MEQ tablet Take 1 tablet (10 mEq total) by mouth daily. For five days starting sunday Patient taking differently: Take 20 mEq by mouth daily. For five days starting sunday 07/30/15  Yes Richard Renae Gloss, MD  QUEtiapine (SEROQUEL) 25 MG tablet Take 50 mg by mouth at bedtime.    Yes Historical Provider, MD  acetaminophen (TYLENOL) 325 MG tablet Take 2 tablets (650 mg total) by mouth  every 6 (six) hours as needed for mild pain (or Fever >/= 101). 07/28/15   Ramonita Lab, MD  apixaban (ELIQUIS) 5 MG TABS tablet Take 1 tablet (5 mg total) by mouth 2 (two) times daily. Patient not taking: Reported on 04/04/2016 02/14/15   Gale Journey, MD  ipratropium-albuterol (DUONEB) 0.5-2.5 (3) MG/3ML SOLN Take 3 mLs by nebulization every 6 (six) hours as needed. Patient not taking: Reported on  04/04/2016 07/28/15   Ramonita Lab, MD  ketoconazole (NIZORAL) 2 % cream Apply 1 application topically daily.     Historical Provider, MD  levofloxacin (LEVAQUIN) 500 MG tablet Take 1 tablet (500 mg total) by mouth daily. 07/28/15   Ramonita Lab, MD  methocarbamol (ROBAXIN) 500 MG tablet Take 1 tablet (500 mg total) by mouth every 8 (eight) hours as needed for muscle spasms. Patient not taking: Reported on 04/04/2016 07/28/15   Ramonita Lab, MD  metroNIDAZOLE (FLAGYL) 500 MG tablet Take 1 tablet (500 mg total) by mouth 3 (three) times daily. Patient not taking: Reported on 04/04/2016 07/28/15   Ramonita Lab, MD  mometasone-formoterol (DULERA) 100-5 MCG/ACT AERO Inhale 2 puffs into the lungs 2 (two) times daily. Patient not taking: Reported on 07/22/2015 02/09/15   Gale Journey, MD  oxyCODONE (OXY IR/ROXICODONE) 5 MG immediate release tablet Take 1-2 tablets (5-10 mg total) by mouth every 4 (four) hours as needed for breakthrough pain ((for MODERATE breakthrough pain)). Patient not taking: Reported on 04/04/2016 07/28/15   Ramonita Lab, MD  saccharomyces boulardii (FLORASTOR) 250 MG capsule Take 1 capsule (250 mg total) by mouth 2 (two) times daily. Patient not taking: Reported on 04/04/2016 07/28/15   Ramonita Lab, MD  senna (SENOKOT) 8.6 MG TABS tablet Take 1 tablet (8.6 mg total) by mouth 2 (two) times daily. 07/28/15   Ramonita Lab, MD    Allergies Azithromycin; Bactrim [sulfamethoxazole-trimethoprim]; Other; Penicillins; Sulfa antibiotics; and Vibramycin [doxycycline calcium]  History reviewed. No pertinent family history.  Social History Social History  Substance Use Topics  . Smoking status: Never Smoker  . Smokeless tobacco: Never Used  . Alcohol use No    Review of Systems Level V examination history is limited by the patient's dementia 10-point ROS otherwise negative.  ____________________________________________   PHYSICAL EXAM:  VITAL SIGNS: ED Triage Vitals  Enc Vitals Group     BP       Pulse      Resp      Temp      Temp src      SpO2      Weight      Height      Head Circumference      Peak Flow      Pain Score      Pain Loc      Pain Edu?      Excl. in GC?     Constitutional: Pleasant in no acute distress alert and oriented to her name and place cachectic Eyes: PERRL EOMI. Head: Atraumatic. Nose: No congestion/rhinnorhea. Mouth/Throat: No trismus Neck: No stridor.   Cardiovascular: Normal rate, regular rhythm. Grossly normal heart sounds.  Good peripheral circulation. Respiratory: Normal respiratory effort.  No retractions. Lungs CTAB and moving good air Gastrointestinal: Soft nondistended nontender no rebound no guarding no peritonitis no McBurney's tenderness negative Rovsing's no costovertebral tenderness negative Murphy's Musculoskeletal: No lower extremity edema   Neurologic: No gross focal neurologic deficits are appreciated. Skin:  Skin is warm, dry and intact. No rash noted. Psychiatric: Speaks slowly and  methodically  ____________________________________________   LABS (all labs ordered are listed, but only abnormal results are displayed)  Labs Reviewed  LACTIC ACID, PLASMA - Abnormal; Notable for the following:       Result Value   Lactic Acid, Venous 2.0 (*)    All other components within normal limits  COMPREHENSIVE METABOLIC PANEL - Abnormal; Notable for the following:    Potassium 6.5 (*)    CO2 19 (*)    Glucose, Bld 144 (*)    BUN 23 (*)    Creatinine, Ser 1.31 (*)    Total Protein 8.7 (*)    ALT 9 (*)    GFR calc non Af Amer 35 (*)    GFR calc Af Amer 40 (*)    All other components within normal limits  TROPONIN I - Abnormal; Notable for the following:    Troponin I 0.26 (*)    All other components within normal limits  CBC WITH DIFFERENTIAL/PLATELET - Abnormal; Notable for the following:    WBC 14.1 (*)    RBC 5.34 (*)    RDW 16.3 (*)    Neutro Abs 11.6 (*)    All other components within normal limits  URINALYSIS,  ROUTINE W REFLEX MICROSCOPIC - Abnormal; Notable for the following:    Color, Urine YELLOW (*)    APPearance HAZY (*)    Leukocytes, UA MODERATE (*)    All other components within normal limits  URINE CULTURE  CULTURE, BLOOD (ROUTINE X 2)  CULTURE, BLOOD (ROUTINE X 2)  LIPASE, BLOOD  PROCALCITONIN  PROTIME-INR  INFLUENZA PANEL BY PCR (TYPE A & B)  LACTIC ACID, PLASMA  POTASSIUM   ____________________________________________  EKG  ED ECG REPORT I, Merrily BrittleNeil Theola Cuellar, the attending physician, personally viewed and interpreted this ECG.  Date: 04/04/2016 EKG Time:  Rate: 100 Rhythm: Sinus tachycardia QRS Axis: normal Intervals: normal ST/T Wave abnormalities: normal Conduction Disturbances: Intraventricular conduction delay Narrative Interpretation: Abnormal EKG QRS duration is 120 which is different from previous at 80 and may represent hyperkalemic changes  ____________________________________________  RADIOLOGY  Plan she will left lower lobe pneumonia ____________________________________________   PROCEDURES  Procedure(s) performed: no  Procedures  Critical Care performed: yes  CRITICAL CARE Performed by: Merrily BrittleNeil Cleatus Goodin   Total critical care time: 35 minutes  Critical care time was exclusive of separately billable procedures and treating other patients.  Critical care was necessary to treat or prevent imminent or life-threatening deterioration.  Critical care was time spent personally by me on the following activities: development of treatment plan with patient and/or surrogate as well as nursing, discussions with consultants, evaluation of patient's response to treatment, examination of patient, obtaining history from patient or surrogate, ordering and performing treatments and interventions, ordering and review of laboratory studies, ordering and review of radiographic studies, pulse oximetry and re-evaluation of patient's  condition.   ____________________________________________   INITIAL IMPRESSION / ASSESSMENT AND PLAN / ED COURSE  Pertinent labs & imaging results that were available during my care of the patient were reviewed by me and considered in my medical decision making (see chart for details).    ----------------------------------------- 1:18 PM on 04/04/2016 -----------------------------------------  The patient is suffering from acute kidney injury with an elevated troponin likely representing a type II and STEMI secondary to her urinary tract infection versus her pneumonia. Her EKG does show slight QRS widening and associated with hyperkalemia to 6.5 and treating with calcium albuterol insulin glucose. She is anaphylactic to penicillin so Levaquin for infection.  I discussed the case with the hospitalist was graciously agreed to admit the patient to her service.  30cc/kg of fluid is 1650cc.  1 liter in so far so 650 more.  ----------------------------------------- 1:26 PM on 04/04/2016 -----------------------------------------  I had a lengthy discussion with the patient and her daughter at bedside and the patient has a DO NOT RESUSCITATE and DO NOT INTUBATE order. ____________________________________________  The patient has a slightly elevated troponin, but no ischemic changes on EKG.  Given her sepsis this is likely a type 2 NSTEMI.  Case discussed with the hospitalist who has graciously agreed to admit the patient to her service.  FINAL CLINICAL IMPRESSION(S) / ED DIAGNOSES  Final diagnoses:  NSTEMI (non-ST elevated myocardial infarction) (HCC)  Hyperkalemia  Severe sepsis (HCC)      NEW MEDICATIONS STARTED DURING THIS VISIT:  New Prescriptions   No medications on file     Note:  This document was prepared using Dragon voice recognition software and may include unintentional dictation errors.     Merrily Brittle, MD 04/04/16 1445    Merrily Brittle, MD 04/04/16  [phone]

## 2016-04-04 NOTE — H&P (Addendum)
[facility] [place] [facility]   PATIENT NAME: [name]    MR#:  [id number]  DATE OF BIRTH:  Feb 10, 1924  DATE OF ADMISSION:  04/04/2016  PRIMARY CARE PHYSICIAN: Rolm Gala, MD   REQUESTING/REFERRING PHYSICIAN: Merrily Brittle, MD  CHIEF COMPLAINT:  WEAKNESS  HISTORY OF PRESENT ILLNESS:  [name]  is a 83 y.o. female with a known history of Hypertension, irritable bowel syndrome, insomnia, And multiple other medical problems was brought into the ED for tiredness.   patientwas  lethargic at the time of arrival but after giving IV fluids she was more awake and alert and answering most of the questions appropriately. Husband and daughter are at bedside. Urine is abnormal and patient is started on IV antibiotics. Patient was the recently treated with 3 classes of antibiotics including Augmentin, ciprofloxacin and Macrobid for possible UTI and bronchitis .patient's potassium is high at 6.5. EKG did not reveal any changes but patient was given calcium gluconate albuterol treatments dextrose and insulin in the emergency department.   PAST MEDICAL HISTORY:   Past Medical History:  Diagnosis Date  . Anterolisthesis   . Blood in stool   . Dementia   . Eczema   . Hematuria   . Hypertension   . IBS (irritable bowel syndrome)   . Insomnia   . LVH (left ventricular hypertrophy) due to hypertensive disease   . Osteopenia   . Urethra, polyp   . Vaginal bleeding     PAST SURGICAL HISTOIRY:   Past Surgical History:  Procedure Laterality Date  . HIP ARTHROPLASTY Right 07/26/2015   Procedure: ARTHROPLASTY BIPOLAR HIP (HEMIARTHROPLASTY);  Surgeon: Juanell Fairly, MD;  Location: [facility] ORS;  Service: Orthopedics;  Laterality: Right;    SOCIAL HISTORY:   Social History  Substance Use Topics  . Smoking status: Never Smoker  . Smokeless tobacco: Never Used  . Alcohol use No    FAMILY HISTORY:  History reviewed. No pertinent family history.  DRUG  ALLERGIES:   Allergies  Allergen Reactions  . Azithromycin Itching  . Bactrim [Sulfamethoxazole-Trimethoprim]   . Other Nausea And Vomiting  . Penicillins Swelling    Has patient had a PCN reaction causing immediate rash, facial/tongue/throat swelling, SOB or lightheadedness with hypotension: Yes Has patient had a PCN reaction causing severe rash involving mucus membranes or skin necrosis: No Has patient had a PCN reaction that required hospitalization No Has patient had a PCN reaction occurring within the last 10 years: Yes If all of the above answers are "NO", then may proceed with Cephalosporin use.  . Sulfa Antibiotics   . Vibramycin [Doxycycline Calcium] Other (See Comments)    Reaction : Unknown    REVIEW OF SYSTEMS:  CONSTITUTIONAL: No fever.Reporting weakness and fatigue EYES: No blurred or double vision.  EARS, NOSE, AND THROAT: No tinnitus or ear pain.  RESPIRATORY: Complaining of cough, shortness of breath, denies wheezing, hemoptysis.  CARDIOVASCULAR: No chest pain, orthopnea, edema.  GASTROINTESTINAL: No nausea, vomiting, diarrhea or abdominal pain.  GENITOURINARY: No dysuria, hematuria.  ENDOCRINE: No polyuria, nocturia,  HEMATOLOGY: No anemia, easy bruising or bleeding SKIN: No rash or lesion. MUSCULOSKELETAL: No joint pain or arthritis.   NEUROLOGIC: No tingling, numbness, weakness.  PSYCHIATRY: No anxiety or depression.   MEDICATIONS AT HOME:   Prior to Admission medications   Medication Sig Start Date End Date Taking? Authorizing Provider  cyanocobalamin (,VITAMIN B-12,) 1000 MCG/ML injection Inject 1,000 mcg into the muscle every 30 (thirty) days.   Yes  Historical Provider, MD  folic acid (FOLVITE) 1 MG tablet Take 1 mg by mouth daily.    Yes Historical Provider, MD  Iron-Vitamins (GERITOL COMPLETE) TABS Take 1 tablet by mouth daily.    Yes Historical Provider, MD  Lifitegrast Benay Spice) 5 % SOLN Place 1 drop into both eyes 2 (two) times daily.   Yes  Historical Provider, MD  Magnesium 500 MG TABS Take 1 tablet by mouth daily.   Yes Historical Provider, MD  memantine (NAMENDA) 10 MG tablet Take 5 mg by mouth 2 (two) times daily.  07/26/14  Yes Historical Provider, MD  PARoxetine (PAXIL) 10 MG tablet Take 10 mg by mouth daily.    Yes Historical Provider, MD  potassium chloride (K-DUR,KLOR-CON) 10 MEQ tablet Take 1 tablet (10 mEq total) by mouth daily. For five days starting sunday Patient taking differently: Take 20 mEq by mouth daily. For five days starting sunday 07/30/15  Yes Richard Renae Gloss, MD  QUEtiapine (SEROQUEL) 25 MG tablet Take 50 mg by mouth at bedtime.    Yes Historical Provider, MD  acetaminophen (TYLENOL) 325 MG tablet Take 2 tablets (650 mg total) by mouth every 6 (six) hours as needed for mild pain (or Fever >/= 101). 07/28/15   Ramonita Lab, MD  apixaban (ELIQUIS) 5 MG TABS tablet Take 1 tablet (5 mg total) by mouth 2 (two) times daily. Patient not taking: Reported on 04/04/2016 02/14/15   Gale Journey, MD  ipratropium-albuterol (DUONEB) 0.5-2.5 (3) MG/3ML SOLN Take 3 mLs by nebulization every 6 (six) hours as needed. Patient not taking: Reported on 04/04/2016 07/28/15   Ramonita Lab, MD  ketoconazole (NIZORAL) 2 % cream Apply 1 application topically daily.     Historical Provider, MD  levofloxacin (LEVAQUIN) 500 MG tablet Take 1 tablet (500 mg total) by mouth daily. 07/28/15   Ramonita Lab, MD  methocarbamol (ROBAXIN) 500 MG tablet Take 1 tablet (500 mg total) by mouth every 8 (eight) hours as needed for muscle spasms. Patient not taking: Reported on 04/04/2016 07/28/15   Ramonita Lab, MD  metroNIDAZOLE (FLAGYL) 500 MG tablet Take 1 tablet (500 mg total) by mouth 3 (three) times daily. Patient not taking: Reported on 04/04/2016 07/28/15   Ramonita Lab, MD  mometasone-formoterol (DULERA) 100-5 MCG/ACT AERO Inhale 2 puffs into the lungs 2 (two) times daily. Patient not taking: Reported on 07/22/2015 02/09/15   Gale Journey, MD  oxyCODONE  (OXY IR/ROXICODONE) 5 MG immediate release tablet Take 1-2 tablets (5-10 mg total) by mouth every 4 (four) hours as needed for breakthrough pain ((for MODERATE breakthrough pain)). Patient not taking: Reported on 04/04/2016 07/28/15   Ramonita Lab, MD  saccharomyces boulardii (FLORASTOR) 250 MG capsule Take 1 capsule (250 mg total) by mouth 2 (two) times daily. Patient not taking: Reported on 04/04/2016 07/28/15   Ramonita Lab, MD  senna (SENOKOT) 8.6 MG TABS tablet Take 1 tablet (8.6 mg total) by mouth 2 (two) times daily. 07/28/15   Ramonita Lab, MD      VITAL SIGNS:  Blood pressure 107/67, pulse (!) 101, temperature 97.5 F (36.4 C), temperature source Oral, resp. rate 19, height 5\' 3"  (1.6 m), weight 54.4 kg (120 lb), SpO2 96 %.  PHYSICAL EXAMINATION:  GENERAL:  83 y.o.-year-old patient lying in the bed with no acute distress.  EYES: Pupils equal, round, reactive to light and accommodation. No scleral icterus. Extraocular muscles intact.  HEENT: Head atraumatic, normocephalic. Oropharynx and nasopharynx clear.  NECK:  Supple, no jugular venous distention. No thyroid  enlargement, no tenderness.  LUNGS: Moderate breath sounds bilaterally, no wheezing, rales,rhonchi or crepitation. No use of accessory muscles of respiration.  CARDIOVASCULAR: S1, S2 normal. No murmurs, rubs, or gallops.  ABDOMEN: Soft, nontender, nondistended. Bowel sounds present. No organomegaly or mass.  EXTREMITIES: No pedal edema, cyanosis, or clubbing.  NEUROLOGIC: Cranial nerves II through XII are intact. Muscle strength 5/5 in all extremities. Sensation intact. Gait not checked.  PSYCHIATRIC: The patient is alert and oriented x 3.  SKIN: No obvious rash, lesion, or ulcer.   LABORATORY PANEL:   CBC  Recent Labs Lab 04/04/16 1206  WBC 14.1*  HGB 14.7  HCT 44.5  PLT 362   ------------------------------------------------------------------------------------------------------------------  Chemistries   Recent  Labs Lab 04/04/16 1206  NA 140  K 6.5*  CL 111  CO2 19*  GLUCOSE 144*  BUN 23*  CREATININE 1.31*  CALCIUM 9.3  AST 27  ALT 9*  ALKPHOS 85  BILITOT 0.7   ------------------------------------------------------------------------------------------------------------------  Cardiac Enzymes  Recent Labs Lab 04/04/16 1206  TROPONINI 0.26*   ------------------------------------------------------------------------------------------------------------------  RADIOLOGY:  Dg Chest Port 1 View  Result Date: 04/04/2016 CLINICAL DATA:  Increasing lethargy.  Vomiting. EXAM: PORTABLE CHEST 1 VIEW COMPARISON:  02/01/2016 . FINDINGS: Mediastinum and hilar structures normal. Mild basilar atelectasis. Small left pleural effusion. Stable calcified nodular opacity right lung base consistent granuloma. Stable cardiomegaly. No pulmonary venous congestion. No acute bony abnormality. Thoracic spine scoliosis concave left. Diffuse degenerative change. IMPRESSION: Mild left base atelectasis with small left pleural effusion. Electronically Signed   By: Maisie Fushomas  Register   On: 04/04/2016 12:23    EKG:   Orders placed or performed during the hospital encounter of 04/04/16  . ED EKG 12-Lead  . ED EKG 12-Lead  . EKG 12-Lead  . EKG 12-Lead    IMPRESSION AND PLAN:   [name]  is a 83 y.o. female with a known history of Hypertension, irritable bowel syndrome, insomnia, And multiple other medical problems was brought into the ED for tiredness.   patientwas  lethargic at the time of arrival but after giving IV fluids she was more awake and alert and answering most of the questions appropriately. Husband and daughter are at bedside. Urine is abnormal and patient is started on IV antibiotics. Patient was the recently treated with 3 classes of antibiotics including Augmentin, ciprofloxacin and Macrobid for possible UTI and bronchitis .patient's potassium is high at 6.5. EKG did not reveal any changes   #Sepsis  secondary to Acute cystitis with abnormal urinalysis/possible healthcare associated pneumonia Patient meets septic criteria with leukocytosis and elevated lactic acid Patient failed outpatient antibiotic(3 courses of antibiotics including Augmentin, Macrobid and ciprofloxacin) Will give her Zosyn and vancomycin Follow-up on the blood cultures and urine cultures, sputum culture and sensitivity CT chest is pending  # Hyperkalemia :  Patient was given calcium gluconate, D50 and insulin. Kayexalate Repeat potassium  #Acute kidney injury secondary to poor by mouth intake Provide IV fluids Avoid nephrotoxins Monitor renal function  #Elevated troponin  patient denies any chest pain  could be from AKI and sepsis Cycle cardiac markers   GI prophylaxis  DVT prophylaxis Lovenox subcutaneous  All the records are reviewed and case discussed with ED provider. Management plans discussed with the patient, family and they are in agreement.  CODE STATUS: DNR ,Daughter is the healthcare power of attorney  TOTAL TIME TAKING CARE OF THIS PATIENT:45 minutes.   Note: This dictation was prepared with Dragon dictation along with smaller phrase technology. Any  transcriptional errors that result from this process are unintentional.  Ramonita Lab M.D on 04/04/2016 at 3:20 PM  Between 7am to 6pm - Pager - [phone]  After 6pm go to [email or web address] - password [email or web address] [facility]  [facility]  Office  [phone]  CC: Primary care physician; Rolm Gala, MD

## 2016-04-04 NOTE — Progress Notes (Addendum)
Pharmacy Antibiotic Note  Colleen QualeDorothy L Chavez is a 83 y.o. female admitted on 04/04/2016 with  sepsis secondary to possible  Cystitis and healthcare associated pneumonia.  Pharmacy has been consulted for Vancomycin and Zosyn dosing. Patient received Levofloxacin 750mg  IV x 2 dose in ED.   - Patient has listed allergy to PCN as swelling. Patient has tolerated cephalosporins in past. Will change Abx  From Zosyn to Cefepime per Dr. Amado CoeGouru.   Plan: Ke: 0.024   Vd: 38   T1/2: 29  Will give patient vancomycin 1gm IV x 1 dose followed by vancomycin 750mg  IV every 36 hours, with 18 hour stakc dosing. Plan for trough prior to 4th dose. Calculated trough at Css is 17. Recommend MRSA PCR- if negative, recommend discontinuation of vancomycin.   Will start patient on Cefepime 2gm IV every 24 hours.   Height: 5\' 3"  (160 cm) Weight: 120 lb (54.4 kg) IBW/kg (Calculated) : 52.4  Temp (24hrs), Avg:97.5 F (36.4 C), Min:97.5 F (36.4 C), Max:97.5 F (36.4 C)   Recent Labs Lab 04/04/16 1206  WBC 14.1*  CREATININE 1.31*  LATICACIDVEN 2.0*    Estimated Creatinine Clearance: 23.6 mL/min (by C-G formula based on SCr of 1.31 mg/dL (H)).    Allergies  Allergen Reactions  . Azithromycin Itching  . Bactrim [Sulfamethoxazole-Trimethoprim]   . Other Nausea And Vomiting  . Penicillins Swelling    Has patient had a PCN reaction causing immediate rash, facial/tongue/throat swelling, SOB or lightheadedness with hypotension: Yes Has patient had a PCN reaction causing severe rash involving mucus membranes or skin necrosis: No Has patient had a PCN reaction that required hospitalization No Has patient had a PCN reaction occurring within the last 10 years: Yes If all of the above answers are "NO", then may proceed with Cephalosporin use.  . Sulfa Antibiotics   . Vibramycin [Doxycycline Calcium] Other (See Comments)    Reaction : Unknown    Antimicrobials this admission: 3/1 Levofloxacin  >> x1 dose 3/1  Vancomycin >> 3/1 Cefepime >>  Dose adjustments this admission:   Microbiology results: 3/1 BCx: sent 3/1 UCx: sent   MRSA PCR:   Thank you for allowing pharmacy to be a part of this patient's care.  Gardner CandleSheema M Saturnino Liew, PharmD, BCPS Clinical Pharmacist 04/04/2016 4:30 PM

## 2016-04-05 LAB — COMPREHENSIVE METABOLIC PANEL
ALK PHOS: 58 U/L (ref 38–126)
ALT: 8 U/L — AB (ref 14–54)
AST: 18 U/L (ref 15–41)
Albumin: 3.1 g/dL — ABNORMAL LOW (ref 3.5–5.0)
Anion gap: 6 (ref 5–15)
BILIRUBIN TOTAL: 0.8 mg/dL (ref 0.3–1.2)
BUN: 20 mg/dL (ref 6–20)
CALCIUM: 8.6 mg/dL — AB (ref 8.9–10.3)
CHLORIDE: 113 mmol/L — AB (ref 101–111)
CO2: 19 mmol/L — ABNORMAL LOW (ref 22–32)
CREATININE: 1.17 mg/dL — AB (ref 0.44–1.00)
GFR calc Af Amer: 46 mL/min — ABNORMAL LOW (ref >60)
GFR, EST NON AFRICAN AMERICAN: 40 mL/min — AB (ref >60)
Glucose, Bld: 93 mg/dL (ref 65–99)
Potassium: 4.6 mmol/L (ref 3.5–5.1)
Sodium: 138 mmol/L (ref 135–145)
Total Protein: 6.3 g/dL — ABNORMAL LOW (ref 6.5–8.1)

## 2016-04-05 LAB — CBC
HCT: 33.7 % — ABNORMAL LOW (ref 35.0–47.0)
Hemoglobin: 11 g/dL — ABNORMAL LOW (ref 12.0–16.0)
MCH: 27.2 pg (ref 26.0–34.0)
MCHC: 32.7 g/dL (ref 32.0–36.0)
MCV: 83 fL (ref 80.0–100.0)
PLATELETS: 256 10*3/uL (ref 150–440)
RBC: 4.06 MIL/uL (ref 3.80–5.20)
RDW: 15.9 % — AB (ref 11.5–14.5)
WBC: 9.7 10*3/uL (ref 3.6–11.0)

## 2016-04-05 LAB — TROPONIN I: TROPONIN I: 0.18 ng/mL — AB (ref <0.03)

## 2016-04-05 NOTE — Care Management (Signed)
Spoke with patient caregiver [name].  Patient has round the clock caregivers and is never left alone.  she has had home health until very recently but not sure of the name of the agency.  Patient's HCPOA is her daughter- [name] who will be on the unit later today.  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 Patient has dementia and requires assist with bathing.  Until just recently, patient was able to ambulate to the bathroom with minimal assist.  If feeling well, she is able to feed herself.  Patient is DNR. Admitted with hyperkalemia, sepsis due to pneumonia and uti.

## 2016-04-05 NOTE — Plan of Care (Signed)
Problem: Education: Goal: Knowledge of  General Education information/materials will improve Outcome: Not Progressing No evidence of learning. Patient has dementia.

## 2016-04-05 NOTE — Progress Notes (Signed)
[facility] [place] [facility]   PATIENT NAME: Colleen LaughterDorothy Chavez    MR#:  [id number]  DATE OF BIRTH:  06/22/1924  SUBJECTIVE:   She here due to altered status and weakness and suspected to have pneumonia and also urinary tract infection. She is demented at baseline, caretaker at bedside. No other acute events overnight.  REVIEW OF SYSTEMS:    Review of Systems  Unable to perform ROS: Dementia    Nutrition: Heart Healthy Tolerating Diet: Yes but little Tolerating PT: Await Eval.   DRUG ALLERGIES:   Allergies  Allergen Reactions  . Azithromycin Itching  . Bactrim [Sulfamethoxazole-Trimethoprim]   . Other Nausea And Vomiting  . Penicillins Swelling    Has patient had a PCN reaction causing immediate rash, facial/tongue/throat swelling, SOB or lightheadedness with hypotension: Yes Has patient had a PCN reaction causing severe rash involving mucus membranes or skin necrosis: No Has patient had a PCN reaction that required hospitalization No Has patient had a PCN reaction occurring within the last 10 years: Yes If all of the above answers are "NO", then may proceed with Cephalosporin use.  . Sulfa Antibiotics   . Vibramycin [Doxycycline Calcium] Other (See Comments)    Reaction : Unknown    VITALS:  Blood pressure 140/73, pulse (!) 102, temperature 98 F (36.7 C), temperature source Oral, resp. rate 18, height 4\' 11"  (1.499 m), weight 56.4 kg (124 lb 6.4 oz), SpO2 100 %.  PHYSICAL EXAMINATION:   Physical Exam  GENERAL:  83 y.o.-year-old patient lying in the bed encephalopathic but in NAD.  EYES: Pupils equal, round, reactive to light and accommodation. No scleral icterus. Extraocular muscles intact.  HEENT: Head atraumatic, normocephalic. Oropharynx and nasopharynx clear.  NECK:  Supple, no jugular venous distention. No thyroid enlargement, no tenderness.  LUNGS: Normal breath sounds bilaterally, no wheezing, rales, rhonchi. No use of accessory muscles of  respiration.  CARDIOVASCULAR: S1, S2 normal. No murmurs, rubs, or gallops.  ABDOMEN: Soft, nontender, nondistended. Bowel sounds present. No organomegaly or mass.  EXTREMITIES: No cyanosis, clubbing or edema b/l.    NEUROLOGIC: Cranial nerves II through XII are intact. No focal Motor or sensory deficits b/l.  Globally weak. PSYCHIATRIC: The patient is alert and oriented x 1.  SKIN: No obvious rash, lesion, or ulcer.    LABORATORY PANEL:   CBC  Recent Labs Lab 04/05/16 0559  WBC 9.7  HGB 11.0*  HCT 33.7*  PLT 256   ------------------------------------------------------------------------------------------------------------------  Chemistries   Recent Labs Lab 04/05/16 0559  NA 138  K 4.6  CL 113*  CO2 19*  GLUCOSE 93  BUN 20  CREATININE 1.17*  CALCIUM 8.6*  AST 18  ALT 8*  ALKPHOS 58  BILITOT 0.8   ------------------------------------------------------------------------------------------------------------------  Cardiac Enzymes  Recent Labs Lab 04/05/16 0559  TROPONINI 0.18*   ------------------------------------------------------------------------------------------------------------------  RADIOLOGY:  Dg Chest Port 1 View  Result Date: 04/04/2016 CLINICAL DATA:  Increasing lethargy.  Vomiting. EXAM: PORTABLE CHEST 1 VIEW COMPARISON:  02/01/2016 . FINDINGS: Mediastinum and hilar structures normal. Mild basilar atelectasis. Small left pleural effusion. Stable calcified nodular opacity right lung base consistent granuloma. Stable cardiomegaly. No pulmonary venous congestion. No acute bony abnormality. Thoracic spine scoliosis concave left. Diffuse degenerative change. IMPRESSION: Mild left base atelectasis with small left pleural effusion. 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  1. Altered mental status/metabolic encephalopathy secondary to pneumonia and urinary tract infection.  -Continue IV vancomycin, Zosyn, follow cultures, follow mental status which is slowly improving.  2. Sepsis-secondary to urinary tract infection pneumonia. -Continue broad-spectrum IV antibiotics with vancomycin/Zosyn. -Follow cultures  3. Hyperkalemia - due to dehydration, AKI.  - improved w/ Kayexylate, D50, Insulin and IV fluid hydration and normalized.   4. Elevated Trop - supply demand ischemia secondary to acute kidney injury and sepsis. -Troponins have not trended up, DC telemetry. Asymptomatic.  5. Acute kidney injury-secondary to dehydration, continue IV fluids, BUN and creatinine improving.  6. Dementia - cont. Namenda, Seroquel.     All the records are reviewed and case discussed with Care Management/Social Worker. Management plans discussed with the patient, family and they are in agreement.  CODE STATUS: DNR  DVT Prophylaxis: Lovenox  TOTAL TIME TAKING CARE OF THIS PATIENT: 30 minutes.   POSSIBLE D/C IN 1-2 DAYS, DEPENDING ON CLINICAL CONDITION.   Houston Siren M.D on 04/05/2016 at 3:31 PM  Between 7am to 6pm - Pager - [phone]  After 6pm go to [email or web address] - Social research officer, government  [facility] [place] [facility]  Office  [phone]  CC: Primary care physician; Rolm Gala, MD

## 2016-04-05 NOTE — Care Management Important Message (Signed)
Important Message  Patient Details  Name: [name] MRN: [id number] Date of Birth: 10/29/1924   Medicare Important Message Given:  Yes  Initial signed IM printed from Epic and given to patient's caregiver and daughter.     Eber HongGreene, Swathi Dauphin R, RN 04/05/2016, 12:41 PM

## 2016-04-05 NOTE — Progress Notes (Signed)
Pharmacy Antibiotic Note  [name] is a 83 y.o. female admitted on 04/04/2016 with  sepsis secondary to possible  Cystitis and healthcare associated pneumonia.  Pharmacy has been consulted for Vancomycin and Zosyn dosing. Patient received Levofloxacin 750mg  IV x 2 dose in ED.   - Patient has listed allergy to PCN as swelling. Patient has tolerated cephalosporins in past. Will change Abx  From Zosyn to Cefepime per Dr. Amado CoeGouru.   Plan: Continue vancomycin 750 mg iv q 36 hours. Will adjust dosing/order levels as necessary to maintain vancomycin trough of 15-20 mcg/ml.   Will continue cefepime 2gm IV every 24 hours.   Height: 4\' 11"  (149.9 cm) Weight: 124 lb 6.4 oz (56.4 kg) IBW/kg (Calculated) : 43.2  Temp (24hrs), Avg:97.9 F (36.6 C), Min:97.5 F (36.4 C), Max:98.3 F (36.8 C)   Recent Labs Lab 04/04/16 1206 04/04/16 1610 04/04/16 1918 04/05/16 0559  WBC 14.1*  --   --  9.7  CREATININE 1.31*  --   --  1.17*  LATICACIDVEN 2.0* 2.8* 1.9  --     Estimated Creatinine Clearance: 24.5 mL/min (by C-G formula based on SCr of 1.17 mg/dL (H)).    Allergies  Allergen Reactions  . Azithromycin Itching  . Bactrim [Sulfamethoxazole-Trimethoprim]   . Other Nausea And Vomiting  . Penicillins Swelling    Has patient had a PCN reaction causing immediate rash, facial/tongue/throat swelling, SOB or lightheadedness with hypotension: Yes Has patient had a PCN reaction causing severe rash involving mucus membranes or skin necrosis: No Has patient had a PCN reaction that required hospitalization No Has patient had a PCN reaction occurring within the last 10 years: Yes If all of the above answers are "NO", then may proceed with Cephalosporin use.  . Sulfa Antibiotics   . Vibramycin [Doxycycline Calcium] Other (See Comments)    Reaction : Unknown    Antimicrobials this admission: 3/1 Levofloxacin  >> x1 dose 3/1 Vancomycin >> 3/1 Cefepime >>  Dose adjustments this  admission:   Microbiology results: 3/1 BCx: NGTD 3/1 UCx: sent   MRSA PCR: positive  Thank you for allowing pharmacy to be a part of this patient's care.  Valentina Guhristy, Liela Rylee D, PharmD, BCPS Clinical Pharmacist 04/05/2016 1:11 PM

## 2016-04-06 LAB — BASIC METABOLIC PANEL
Anion gap: 9 (ref 5–15)
BUN: 15 mg/dL (ref 6–20)
CO2: 17 mmol/L — ABNORMAL LOW (ref 22–32)
CREATININE: 1.25 mg/dL — AB (ref 0.44–1.00)
Calcium: 8.5 mg/dL — ABNORMAL LOW (ref 8.9–10.3)
Chloride: 115 mmol/L — ABNORMAL HIGH (ref 101–111)
GFR, EST AFRICAN AMERICAN: 43 mL/min — AB (ref >60)
GFR, EST NON AFRICAN AMERICAN: 37 mL/min — AB (ref >60)
Glucose, Bld: 96 mg/dL (ref 65–99)
Potassium: 3.9 mmol/L (ref 3.5–5.1)
SODIUM: 141 mmol/L (ref 135–145)

## 2016-04-06 LAB — CBC
HCT: 34.3 % — ABNORMAL LOW (ref 35.0–47.0)
Hemoglobin: 11.2 g/dL — ABNORMAL LOW (ref 12.0–16.0)
MCH: 27.2 pg (ref 26.0–34.0)
MCHC: 32.7 g/dL (ref 32.0–36.0)
MCV: 83.2 fL (ref 80.0–100.0)
PLATELETS: 249 10*3/uL (ref 150–440)
RBC: 4.12 MIL/uL (ref 3.80–5.20)
RDW: 16.1 % — ABNORMAL HIGH (ref 11.5–14.5)
WBC: 9.3 10*3/uL (ref 3.6–11.0)

## 2016-04-06 LAB — URINE CULTURE: Culture: NO GROWTH

## 2016-04-06 MED ORDER — DEXTROSE 5 % IV SOLN
2.0000 g | INTRAVENOUS | Status: DC
  Filled 2016-04-06: qty 2

## 2016-04-06 MED ORDER — LEVOFLOXACIN 500 MG PO TABS
750.0000 mg | ORAL_TABLET | ORAL | Status: DC
  Administered 2016-04-06: 750 mg via ORAL
  Filled 2016-04-06: qty 2

## 2016-04-06 NOTE — Plan of Care (Signed)
Problem: Pain Managment: Goal: General experience of comfort will improve Outcome: Progressing Prn medications  Problem: Physical Regulation: Goal: Will remain free from infection Outcome: Progressing IV antibiotics  Problem: Tissue Perfusion: Goal: Risk factors for ineffective tissue perfusion will decrease Outcome: Progressing SQ Lovenox   Problem: Activity: Goal: Ability to tolerate increased activity will improve Outcome: Not Progressing Remains bedrest,

## 2016-04-06 NOTE — Progress Notes (Signed)
[facility] - [place] at [facility]   PATIENT NAME: [name]    MR#:  [id number]  DATE OF BIRTH:  05/27/1924  SUBJECTIVE:   Patient here due to altered mental status and suspected to have UTI and pneumonia. Mental status a bit improved today. Afebrile, white cell count is normal. Caretaker at bedside.  REVIEW OF SYSTEMS:    Review of Systems  Unable to perform ROS: Dementia    Nutrition: Heart Healthy Tolerating Diet: Yes but little   DRUG ALLERGIES:   Allergies  Allergen Reactions  . Azithromycin Itching  . Bactrim [Sulfamethoxazole-Trimethoprim]   . Other Nausea And Vomiting  . Penicillins Swelling    Has patient had a PCN reaction causing immediate rash, facial/tongue/throat swelling, SOB or lightheadedness with hypotension: Yes Has patient had a PCN reaction causing severe rash involving mucus membranes or skin necrosis: No Has patient had a PCN reaction that required hospitalization No Has patient had a PCN reaction occurring within the last 10 years: Yes If all of the above answers are "NO", then may proceed with Cephalosporin use.  . Sulfa Antibiotics   . Vibramycin [Doxycycline Calcium] Other (See Comments)    Reaction : Unknown    VITALS:  Blood pressure 113/60, pulse 96, temperature 97.6 F (36.4 C), temperature source Oral, resp. rate 17, height 4\' 11"  (1.499 m), weight 56.4 kg (124 lb 6.4 oz), SpO2 95 %.  PHYSICAL EXAMINATION:   Physical Exam  GENERAL:  83 y.o.-year-old patient lying in the bed encephalopathic but in NAD.  EYES: Pupils equal, round, reactive to light and accommodation. No scleral icterus. Extraocular muscles intact.  HEENT: Head atraumatic, normocephalic. Oropharynx and nasopharynx clear.  NECK:  Supple, no jugular venous distention. No thyroid enlargement, no tenderness.  LUNGS: Normal breath sounds bilaterally, no wheezing, rales, rhonchi. No use of accessory muscles of respiration.  CARDIOVASCULAR: S1, S2 normal.  No murmurs, rubs, or gallops.  ABDOMEN: Soft, nontender, nondistended. Bowel sounds present. No organomegaly or mass.  EXTREMITIES: No cyanosis, clubbing or edema b/l.    NEUROLOGIC: Cranial nerves II through XII are intact. No focal Motor or sensory deficits b/l.  Globally weak. PSYCHIATRIC: The patient is alert and oriented x 1.  SKIN: No obvious rash, lesion, or ulcer.    LABORATORY PANEL:   CBC  Recent Labs Lab 04/06/16 0606  WBC 9.3  HGB 11.2*  HCT 34.3*  PLT 249   ------------------------------------------------------------------------------------------------------------------  Chemistries   Recent Labs Lab 04/05/16 0559 04/06/16 0606  NA 138 141  K 4.6 3.9  CL 113* 115*  CO2 19* 17*  GLUCOSE 93 96  BUN 20 15  CREATININE 1.17* 1.25*  CALCIUM 8.6* 8.5*  AST 18  --   ALT 8*  --   ALKPHOS 58  --   BILITOT 0.8  --    ------------------------------------------------------------------------------------------------------------------  Cardiac Enzymes  Recent Labs Lab 04/05/16 0559  TROPONINI 0.18*   ------------------------------------------------------------------------------------------------------------------  RADIOLOGY:  No results found.   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  1. Altered mental status/metabolic encephalopathy secondary to pneumonia and urinary tract infection.  - change abx from IV Vanc, Zosyn to Levaquin and follow cultures.  - mental status much improved.   2. Sepsis-secondary to urinary tract infection, pneumonia. - Change antibiotics from broad spectrum Vanco and Zosyn to IV Levaquin.  Afebrile, white cell count normalized. Cultures so far negative.  3. Hyperkalemia - due to dehydration, AKI.  - improved w/ Kayexylate,  D50, Insulin and IV fluid hydration and normalized.   4. Elevated Trop - supply demand ischemia secondary to acute kidney injury and sepsis. -Troponins did not trend up and no acute symptoms.   5. Acute kidney injury-secondary to dehydration, continue IV fluids, BUN and creatinine improving.  6. Dementia - cont. Namenda, Seroquel.    Possible d/c home tomorrow on Oral abx if continues to improve.   All the records are reviewed and case discussed with Care Management/Social Worker. Management plans discussed with the patient, family and they are in agreement.  CODE STATUS: DNR  DVT Prophylaxis: Lovenox  TOTAL TIME TAKING CARE OF THIS PATIENT: 25 minutes.   POSSIBLE D/C IN 1-2 DAYS, DEPENDING ON CLINICAL CONDITION.   Houston Siren M.D on 04/06/2016 at 12:38 PM  Between 7am to 6pm - Pager - [phone]  After 6pm go to [email or web address] - Social research officer, government  [facility]  [facility]  Office  [phone]  CC: Primary care physician; Rolm Gala, MD

## 2016-04-06 NOTE — Progress Notes (Signed)
Pharmacy Antibiotic Note  [name] is a 83 y.o. female admitted on 04/04/2016 with  sepsis secondary to possible  Cystitis and healthcare associated pneumonia.  Pharmacy has been consulted for levofloxacin dosing. Patient received Levofloxacin 750mg  IV x 2 dose in ED.   -Patient was switched to vancomycin and cefepime yesterday from Zosyn. MD wants levofloxacin although patient has tolerated PCNs in the past with PCN allergy.    Plan: Initiate levofloxacin 750mg  PO Q48hr as patient is able to tolerate PO.    Height: 4\' 11"  (149.9 cm) Weight: 124 lb 6.4 oz (56.4 kg) IBW/kg (Calculated) : 43.2  Temp (24hrs), Avg:97.9 F (36.6 C), Min:97.5 F (36.4 C), Max:98.2 F (36.8 C)   Recent Labs Lab 04/04/16 1206 04/04/16 1610 04/04/16 1918 04/05/16 0559 04/06/16 0606  WBC 14.1*  --   --  9.7 9.3  CREATININE 1.31*  --   --  1.17* 1.25*  LATICACIDVEN 2.0* 2.8* 1.9  --   --     Estimated Creatinine Clearance: 22.9 mL/min (by C-G formula based on SCr of 1.25 mg/dL (H)).    Allergies  Allergen Reactions  . Azithromycin Itching  . Bactrim [Sulfamethoxazole-Trimethoprim]   . Other Nausea And Vomiting  . Penicillins Swelling    Has patient had a PCN reaction causing immediate rash, facial/tongue/throat swelling, SOB or lightheadedness with hypotension: Yes Has patient had a PCN reaction causing severe rash involving mucus membranes or skin necrosis: No Has patient had a PCN reaction that required hospitalization No Has patient had a PCN reaction occurring within the last 10 years: Yes If all of the above answers are "NO", then may proceed with Cephalosporin use.  . Sulfa Antibiotics   . Vibramycin [Doxycycline Calcium] Other (See Comments)    Reaction : Unknown    Antimicrobials this admission: 3/1 Levofloxacin  >> x1 dose 3/1 Vancomycin >> 3/2 3/1 Cefepime >> 3/2 3/2 levofloxacin >>  Dose adjustments this admission:   Microbiology results: 3/1 BCx: NGTD 3/1 UCx: sent    MRSA PCR: positive  Thank you for allowing pharmacy to be a part of this patient's care.  Delsa BernKelly m Burgess Sheriff, PharmD 04/06/2016 9:59 AM

## 2016-04-06 NOTE — Evaluation (Signed)
Physical Therapy Evaluation Patient Details Name: SHAKARIA RAPHAEL MRN: [id number] DOB: Mar 21, 1924 Today's Date: 04/06/2016   History of Present Illness  Pt. is a 83 year old female admitted to [facility] on 04/04/16 with hyperkalemia and sepsis secondary to UTI and pneumonia. Pt. has a medical history significant for dementia, HTN and a  R THA. Pt. has been receiving home health PT.   Clinical Impression  Patient was limited by fatigue and generalized weakness in regards to bed mobility, transfers and ambulation. She did need increased assistance and cueing for mobility tasks and was able to perform 5-6 standing trials at the edge of the bed, demonstrating sidestepping 2-3 steps during the last trial. She did complain of L knee pain with weightbearing tasks, which her visitor also commented on (the patient was also scheduled to have her R hip checked this past week; however, she got sick and her visit was cancelled); this was discussed with her nurse following PT evaluation. Patient may benefit from skilled PT while at [facility] to address bed mobility, transfers and ambulation as well as therapeutic exercises for generalized LE strengthening. Patient currently has 24 hour caregiver assistance at home and does perform limited mobility/ambulation tasks, at times using a wheelchair if she is too weak. Patient has been receiving home health PT and if she is able to return to her baseline in regards to ambulation/mobility, may be able to return home with home health PT/24 hour caregiver assistance.     Follow Up Recommendations Home health PT     Equipment Recommendations       Recommendations for Other Services       Precautions / Restrictions Precautions Precautions: Fall Precaution Comments: Pt. has a pressure boot that she wears in bed for for her R foot due to issues from a pressure ulcer following her R THA.; no issues reported with ambulation on the R heel.  Spoke with RN prior to treatment and she is  cleared to see.  Restrictions Weight Bearing Restrictions: No      Mobility  Bed Mobility Overal bed mobility: Needs Assistance Bed Mobility: Supine to Sit;Sit to Supine     Supine to sit: Min assist Sit to supine: Mod assist   General bed mobility comments: increased verbal and tactile cueing needed; complaind of L knee pain with scooting  Transfers Overall transfer level: Needs assistance Equipment used: Rolling walker (2 wheeled) Transfers: Sit to/from Stand Sit to Stand: Mod assist         General transfer comment: increased cueing; attempted to push from bed, but patient normally pulls from walker at home. Tended to maintain a flexed posture in standing, with cueing, she was able to stand up straight, but did complain of knee pain. Pt. tended to sit without warning, holding onto the walker.   Ambulation/Gait   Ambulation Distance (Feet): 2 Feet (Pt. sidestepping 2-3 steps at the edge of the bed; fatigued) Assistive device: Rolling walker (2 wheeled) Gait Pattern/deviations: Trunk flexed     General Gait Details: Increased cueing to weightshift; tended to slid R leg on floor; complained of L knee pain with weightbearing  Stairs            Wheelchair Mobility    Modified Rankin (Stroke Patients Only)       Balance Overall balance assessment: Needs assistance Sitting-balance support: Bilateral upper extremity supported Sitting balance-Leahy Scale: Good Sitting balance - Comments: UE support to maintain balance while doing LE exercises at edge of bed-tended to lean  backwards with exercises   Standing balance support: Bilateral upper extremity supported Standing balance-Leahy Scale: Poor                               Pertinent Vitals/Pain Pain Assessment: No/denies pain    Home Living Family/patient expects to be discharged to:: Private residence Living Arrangements:  (24 hour care) Available Help at Discharge: Available 24  hours/day Type of Home: House Home Access: Ramped entrance     Home Layout: One level Home Equipment: Walker - 2 wheels;Wheelchair - manual Additional Comments: Ambulates limited distances at home (bed to bathroom); uses wheelchair at times if fatigued    Prior Function Level of Independence: Needs assistance   Gait / Transfers Assistance Needed: Pt. ambulates in her home and at times uses wheelchair if fatigued           Hand Dominance        Extremity/Trunk Assessment        Lower Extremity Assessment Lower Extremity Assessment: Generalized weakness (Pt. does complain of L knee pain with weightbearing)       Communication      Cognition Arousal/Alertness: Awake/alert Behavior During Therapy: WFL for tasks assessed/performed Overall Cognitive Status: Within Functional Limits for tasks assessed                 General Comments: Pt. does have medical history of dementia; visitor present assisted with questions regarding home, ambulation status. etc    General Comments      Exercises General Exercises - Lower Extremity Long Arc Quad: Strengthening;5 reps Hip Flexion/Marching: 5 reps;Strengthening   Assessment/Plan    PT Assessment Patient needs continued PT services  PT Problem List Decreased strength;Decreased mobility;Decreased activity tolerance;Decreased safety awareness       PT Treatment Interventions Therapeutic exercise;Functional mobility training    PT Goals (Current goals can be found in the Care Plan section)  Acute Rehab PT Goals Patient Stated Goal: To go home PT Goal Formulation: With patient/family Time For Goal Achievement: 04/20/16 Potential to Achieve Goals: Fair    Frequency Min 2X/week   Barriers to discharge        Co-evaluation               End of Session Equipment Utilized During Treatment: Gait belt Activity Tolerance: Patient limited by fatigue (as well as complaints of L knee pain) Patient left: in  bed;with bed alarm set;with family/visitor present Nurse Communication: Mobility status;Other (comment) (L knee pain complaints during treatment) PT Visit Diagnosis: Other abnormalities of gait and mobility (R26.89);Muscle weakness (generalized) (M62.81)         Time: [id number] PT Time Calculation (min) (ACUTE ONLY): 18 min   Charges:   PT Evaluation $PT Eval Low Complexity: 1 Procedure     PT G Codes:         Wilhemina CashPatricia P Sreya Froio, PT, DPT, CWCE 04/06/2016, 12:33 PM

## 2016-04-07 ENCOUNTER — Encounter: Payer: Self-pay | Admitting: *Deleted

## 2016-04-07 LAB — BASIC METABOLIC PANEL
ANION GAP: 7 (ref 5–15)
BUN: 14 mg/dL (ref 6–20)
CO2: 20 mmol/L — ABNORMAL LOW (ref 22–32)
Calcium: 8.3 mg/dL — ABNORMAL LOW (ref 8.9–10.3)
Chloride: 114 mmol/L — ABNORMAL HIGH (ref 101–111)
Creatinine, Ser: 0.88 mg/dL (ref 0.44–1.00)
GFR calc Af Amer: >60 mL/min (ref >60)
GFR, EST NON AFRICAN AMERICAN: 56 mL/min — AB (ref >60)
Glucose, Bld: 100 mg/dL — ABNORMAL HIGH (ref 65–99)
POTASSIUM: 3.5 mmol/L (ref 3.5–5.1)
SODIUM: 141 mmol/L (ref 135–145)

## 2016-04-07 LAB — CBC
HCT: 32.5 % — ABNORMAL LOW (ref 35.0–47.0)
Hemoglobin: 10.7 g/dL — ABNORMAL LOW (ref 12.0–16.0)
MCH: 27 pg (ref 26.0–34.0)
MCHC: 32.9 g/dL (ref 32.0–36.0)
MCV: 82 fL (ref 80.0–100.0)
PLATELETS: 266 10*3/uL (ref 150–440)
RBC: 3.97 MIL/uL (ref 3.80–5.20)
RDW: 16.2 % — ABNORMAL HIGH (ref 11.5–14.5)
WBC: 9.8 10*3/uL (ref 3.6–11.0)

## 2016-04-07 MED ORDER — LEVOFLOXACIN 500 MG PO TABS
750.0000 mg | ORAL_TABLET | ORAL | Status: DC
  Administered 2016-04-08: 750 mg via ORAL
  Filled 2016-04-07: qty 2

## 2016-04-07 MED ORDER — ENOXAPARIN SODIUM 40 MG/0.4ML ~~LOC~~ SOLN
40.0000 mg | SUBCUTANEOUS | Status: DC
  Administered 2016-04-07: 40 mg via SUBCUTANEOUS
  Filled 2016-04-07: qty 0.4

## 2016-04-07 MED ORDER — LEVOFLOXACIN 750 MG PO TABS: 750.0000 mg | ORAL_TABLET | Freq: Every day | ORAL | 0 refills | Status: DC

## 2016-04-07 NOTE — Progress Notes (Signed)
Pharmacist-Provider Communication   Patients CrCl >2830ml/min and weight >45kg. Will adjust enoxaparin to 40mg  Q24hr.   Delsa BernKelly m Sherlynn Tourville, PharmD 11:03 AM 04/07/2016

## 2016-04-07 NOTE — Progress Notes (Signed)
Pt has a bed ready on 1A, daughter at bedside has been made aware & she will let the rest of the family know. Vitals stable pt asleep and pain free. Report has been called to Adele Barthelracy Toomes, 1A RN. Pt packed up and ready to transfer to room 159. Will continue to monitor. Shirley FriarAlexis Miller, RN, BSN

## 2016-04-07 NOTE — Progress Notes (Signed)
PHARMACIST - PHYSICIAN COMMUNICATION  CONCERNING: Antibiotic IV to Oral Route Change Policy  RECOMMENDATION: This patient is receiving levofloxacin by the intravenous route.  Based on criteria approved by the Pharmacy and Therapeutics Committee, the antibiotic(s) is/are being converted to the equivalent oral dose form(s).   DESCRIPTION: These criteria include:  Patient being treated for a respiratory tract infection, urinary tract infection, cellulitis or clostridium difficile associated diarrhea if on metronidazole  The patient is not neutropenic and does not exhibit a GI malabsorption state  The patient is eating (either orally or via tube) and/or has been taking other orally administered medications for a least 24 hours  The patient is improving clinically and has a Tmax < 100.5  If you have questions about this conversion, please contact the Pharmacy Department  []   [phone]( [phone] )  [facility] [x]   [phone]( [phone] )  [facility] []   [phone]( [phone] )  [facility] []   [phone] )  [facility] []   [phone]( [phone] )  [facility]   Garlon HatchetJody Yeshua Stryker, PharmD, BCPS Clinical Pharmacist  04/07/2016 11:49 AM

## 2016-04-07 NOTE — Care Management Note (Signed)
Case Management Note  Patient Details  Name: Harvel QualeDorothy L Pasillas MRN: [id number] Date of Birth: 04/07/1924  Subjective/Objective:     Discussed discharge planning with daughter Excell SeltzerKathy Adams who [facility]. [facility] for [facility] health PT and [facility] [place] [facility]. Updated Elnita MaxwellCheryl that Mrs Ernest Mallicksley may be discharged tomorrow if not today as anticipated.               Action/Plan:   Expected Discharge Date:  04/07/16               Expected Discharge Plan:     In-House Referral:     Discharge planning Services     Post Acute Care Choice:    Choice offered to:     DME Arranged:    DME Agency:     HH Arranged:    HH Agency:     Status of Service:     If discussed at [employer] Length of [employer] Meetings, dates discussed:    Additional Comments:  Abdifatah Colquhoun A, RN 04/07/2016, 10:49 AM

## 2016-04-07 NOTE — Progress Notes (Signed)
[facility] [place] [facility]   PATIENT NAME: [name]    MR#:  [id number]  DATE OF BIRTH:  [birth year]  SUBJECTIVE:   Patient here due to altered mental status and suspected to have UTI and pneumonia. Remains sleepy this a.m. DAughter at bedside. No other acute events overnight.   REVIEW OF SYSTEMS:    Review of Systems  Unable to perform ROS: Dementia    Nutrition: Heart Healthy Tolerating Diet: Yes but little   DRUG ALLERGIES:   Allergies  Allergen Reactions  . Azithromycin Itching  . Bactrim [Sulfamethoxazole-Trimethoprim]   . Other Nausea And Vomiting  . Penicillins Swelling    Has patient had a PCN reaction causing immediate rash, facial/tongue/throat swelling, SOB or lightheadedness with hypotension: Yes Has patient had a PCN reaction causing severe rash involving mucus membranes or skin necrosis: No Has patient had a PCN reaction that required hospitalization No Has patient had a PCN reaction occurring within the last 10 years: Yes If all of the above answers are "NO", then may proceed with Cephalosporin use.  . Sulfa Antibiotics   . Vibramycin [Doxycycline Calcium] Other (See Comments)    Reaction : Unknown    VITALS:  Blood pressure (!) 145/79, pulse 100, temperature 97.9 F (36.6 C), temperature source Oral, resp. rate 17, height 4\' 11"  (1.499 m), weight 56.4 kg (124 lb 6.4 oz), SpO2 96 %.  PHYSICAL EXAMINATION:   Physical Exam  GENERAL:  83 y.o.-year-old patient lying in the bed lethargic but arousable and in NAD.   EYES: Pupils equal, round, reactive to light and accommodation. No scleral icterus. Extraocular muscles intact.  HEENT: Head atraumatic, normocephalic. Oropharynx and nasopharynx clear.  NECK:  Supple, no jugular venous distention. No thyroid enlargement, no tenderness.  LUNGS: Normal breath sounds bilaterally, no wheezing, rales, rhonchi. No use of accessory muscles of respiration.  CARDIOVASCULAR: S1, S2 normal. No  murmurs, rubs, or gallops.  ABDOMEN: Soft, nontender, nondistended. Bowel sounds present. No organomegaly or mass.  EXTREMITIES: No cyanosis, clubbing or edema b/l.    NEUROLOGIC: Cranial nerves II through XII are intact. No focal Motor or sensory deficits b/l.  Globally weak. PSYCHIATRIC: The patient is alert and oriented x 1.  SKIN: No obvious rash, lesion, or ulcer.    LABORATORY PANEL:   CBC  Recent Labs Lab 04/07/16 0508  WBC 9.8  HGB 10.7*  HCT 32.5*  PLT 266   ------------------------------------------------------------------------------------------------------------------  Chemistries   Recent Labs Lab 04/05/16 0559  04/07/16 0508  NA 138  < > 141  K 4.6  < > 3.5  CL 113*  < > 114*  CO2 19*  < > 20*  GLUCOSE 93  < > 100*  BUN 20  < > 14  CREATININE 1.17*  < > 0.88  CALCIUM 8.6*  < > 8.3*  AST 18  --   --   ALT 8*  --   --   ALKPHOS 58  --   --   BILITOT 0.8  --   --   < > = values in this interval not displayed. ------------------------------------------------------------------------------------------------------------------  Cardiac Enzymes  Recent Labs Lab 04/05/16 0559  TROPONINI 0.18*   ------------------------------------------------------------------------------------------------------------------  RADIOLOGY:  No results found.   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  1. Altered mental status/metabolic encephalopathy  secondary to pneumonia and urinary tract infection.  - cont. Levaquin and mental status improving but waxes and wanes.  Probably progressive dementia too.   2. Sepsis-secondary to urinary tract infection, pneumonia. - Change antibiotics yesterday from broad spectrum Vanco and Zosyn to IV Levaquin. Afebrile, white cell  count normalized. Cultures remain negative.   3. Hyperkalemia - due to dehydration, AKI.  - improved w/ Kayexylate, D50, Insulin and IV fluid hydration and normalized.   4. Elevated Trop - supply demand ischemia secondary to acute kidney injury and sepsis. -Troponins did not trend up and no acute symptoms.   5. Acute kidney injury-secondary to dehydration.  Improved and resolved w/ IV fluids.   6. Dementia - cont. Namenda, Seroquel.    PT recommending Home Health but pt. Likely needs SNF and family refusing as they have 24 hour care at home.   All the records are reviewed and case discussed with Care Management/Social Worker. Management plans discussed with the patient, family and they are in agreement.  CODE STATUS: DNR  DVT Prophylaxis: Lovenox  TOTAL TIME TAKING CARE OF THIS PATIENT: 25 minutes.   POSSIBLE D/C IN 1-2 DAYS, DEPENDING ON CLINICAL CONDITION.   Houston SirenSAINANI,VIVEK J M.D on 04/07/2016 at 12:29 PM  Between 7am to 6pm - Pager - [phone]  After 6pm go to [email or web address] - Social research officer, governmentpassword [email or web address] [email or web address]  [facility]  Office  [phone]  CC: Primary care physician; Rolm GalaGRANDIS, HEIDI, MD

## 2016-04-07 NOTE — Plan of Care (Signed)
Problem: Health Behavior/Discharge Planning: Goal: Ability to manage health-related needs will improve Outcome: Progressing Case management seeing pt  Problem: Pain Managment: Goal: General experience of comfort will improve Outcome: Progressing Prn medications  Problem: Physical Regulation: Goal: Will remain free from infection Outcome: Not Progressing PO antibiotics  Problem: Tissue Perfusion: Goal: Risk factors for ineffective tissue perfusion will decrease Outcome: Progressing SQ Lovenox   Problem: Activity: Goal: Ability to tolerate increased activity will improve Outcome: Not Progressing Very weak, working with PT

## 2016-04-08 MED ORDER — LEVOFLOXACIN 750 MG PO TABS: 750.0000 mg | ORAL_TABLET | ORAL | 0 refills | Status: DC

## 2016-04-08 NOTE — Clinical Social Work Note (Signed)
Pt is ready for discharge today and per PT the plan has changed recommendation to SNF. CSW spoke with family to address discharge plan. [name], [name], shared that she is [facility] as she has been there in the past. CSW initiated SNF search and followed up with bed offers. [name] chose [facility]. Facility is able to accept pt today as they have received discharge information. [name] is aware and agreeable to discharge plan. RN will call report. [facility]. CSW is signing off as no further needs identified.   Dede QuerySarah Jacelynn Hayton, MSW, LCSW  Clinical Social Worker  [phone]

## 2016-04-08 NOTE — Progress Notes (Signed)
Called report to Linzie CollinPatricia Afari, LPN at [facility]. Answered all questions. EMS called for transport. Belongings packed for transport.

## 2016-04-08 NOTE — NC FL2 (Signed)
[place] MEDICAID FL2 LEVEL OF CARE SCREENING TOOL     IDENTIFICATION  Patient Name: [name] Birthdate: [birth year] Sex: female Admission Date (Current Location): 04/04/2016  [place] and [place] Number:  Chiropodist and Address:  [facility], [address], [place], [place] [address]      Provider Number: [id number]  Attending Physician Name and Address:  Adrian Saran, MD  Relative Name and Phone Number:       Current Level of Care: Hospital Recommended Level of Care: Skilled Nursing Facility Prior Approval Number:    Date Approved/Denied:   PASRR Number: [id number]  Discharge Plan: SNF    Current Diagnoses: Patient Active Problem List   Diagnosis Date Noted  . Hyperkalemia 04/04/2016  . Pressure ulcer 07/28/2015  . Closed right hip fracture (HCC) 07/22/2015  . Hip fracture (HCC) 07/22/2015  . Acute pulmonary embolism (HCC) 02/09/2015  . Malnutrition of moderate degree 02/07/2015  . Hypokalemia 02/05/2015  . SPL (spondylolisthesis) 07/28/2014  . Osteopenia 07/28/2014  . Poor balance 07/28/2014  . Adynamia 07/28/2014  . Hypertensive left ventricular hypertrophy 12/21/2013  . Disorder of rotator cuff 07/09/2013  . Dementia 01/25/2013  . Regurgitation, of nonorganic origin, of food with reswallowing 01/25/2013  . Blood pressure elevated 12/28/2012  . Amnesia 11/19/2010  . Dermatitis, eczematoid 08/20/2010  . Difficulty hearing 08/20/2010  . Adaptive colitis 08/20/2010  . Cannot sleep 08/20/2010  . Affective disorder (HCC) 08/20/2010    Orientation RESPIRATION BLADDER Height & Weight     Self  Normal Continent Weight: 124 lb 6.4 oz (56.4 kg) Height:  4\' 11"  (149.9 cm)  BEHAVIORAL SYMPTOMS/MOOD NEUROLOGICAL BOWEL NUTRITION STATUS      Continent Diet (2mg  Sodium Diet, Thin Liquids)  AMBULATORY STATUS COMMUNICATION OF NEEDS Skin   Extensive Assist Verbally Normal                       Personal Care Assistance  Level of Assistance  Bathing, Feeding, Dressing Bathing Assistance: Limited assistance Feeding assistance: Limited assistance Dressing Assistance: Limited assistance     Functional Limitations Info  Sight, Hearing, Speech Sight Info: Adequate Hearing Info: Adequate Speech Info: Adequate    SPECIAL CARE FACTORS FREQUENCY  PT (By licensed PT)     PT Frequency: 5              Contractures Contractures Info: Not present    Additional Factors Info  Code Status, Allergies, Psychotropic, Isolation Precautions Code Status Info: DNR Allergies Info: Azithromycin, Bactrim Sulfamethoxazole-trimethoprim, Other, Penicillins, Sulfa Antibiotics, Vibramycin Doxycycline Calcium Psychotropic Info: Medications:  Paxil, Seroquel   Isolation Precautions Info: Contact Isolation-MRSA     Current Medications (04/08/2016):  This is the current hospital active medication list Current Facility-Administered Medications  Medication Dose Route Frequency Provider Last Rate Last Dose  . acetaminophen (TYLENOL) tablet 650 mg  650 mg Oral Q6H PRN Ramonita Lab, MD   650 mg at 04/06/16 2307   Or  . acetaminophen (TYLENOL) suppository 650 mg  650 mg Rectal Q6H PRN Ramonita Lab, MD      . Melene Muller ON 04/25/2016] cyanocobalamin ((VITAMIN B-12)) injection 1,000 mcg  1,000 mcg Intramuscular Q30 days Ramonita Lab, MD      . enoxaparin (LOVENOX) injection 40 mg  40 mg Subcutaneous Q24H Delsa Bern, RPH   40 mg at 04/07/16 2155  . folic acid (FOLVITE) tablet 1 mg  1 mg Oral Daily Ramonita Lab, MD   1 mg at 04/08/16  0940  . ipratropium-albuterol (DUONEB) 0.5-2.5 (3) MG/3ML nebulizer solution 3 mL  3 mL Nebulization Q6H Ramonita LabAruna Gouru, MD   3 mL at 04/08/16 0753  . ketoconazole (NIZORAL) 2 % cream 1 application  1 application Topical Daily PRN Ramonita LabAruna Gouru, MD      . levofloxacin (LEVAQUIN) tablet 750 mg  750 mg Oral Q48H Houston SirenVivek J Sainani, MD   750 mg at 04/08/16 0939  . Lifitegrast 5 % SOLN 1 drop  1 drop Both Eyes BID Deanna ArtisAruna  Gouru, MD      . magnesium oxide (MAG-OX) tablet 400 mg  400 mg Oral Daily Ramonita LabAruna Gouru, MD   400 mg at 04/08/16 0940  . memantine (NAMENDA) tablet 5 mg  5 mg Oral BID Ramonita LabAruna Gouru, MD   5 mg at 04/08/16 0939  . multivitamin with minerals tablet 1 tablet  1 tablet Oral Daily Ramonita LabAruna Gouru, MD   1 tablet at 04/08/16 0939  . ondansetron (ZOFRAN) tablet 4 mg  4 mg Oral Q6H PRN Ramonita LabAruna Gouru, MD       Or  . ondansetron (ZOFRAN) injection 4 mg  4 mg Intravenous Q6H PRN Ramonita LabAruna Gouru, MD      . PARoxetine (PAXIL) tablet 10 mg  10 mg Oral Daily Ramonita LabAruna Gouru, MD   10 mg at 04/08/16 0940  . QUEtiapine (SEROQUEL) tablet 12.5 mg  12.5 mg Oral Daily Ramonita LabAruna Gouru, MD   12.5 mg at 04/07/16 1610  . QUEtiapine (SEROQUEL) tablet 37.5 mg  37.5 mg Oral QHS Ramonita LabAruna Gouru, MD   37.5 mg at 04/07/16 2155  . senna (SENOKOT) tablet 8.6 mg  1 tablet Oral BID PRN Ramonita LabAruna Gouru, MD      . sodium chloride 0.9 % bolus 650 mL  650 mL Intravenous Once Merrily BrittleNeil Rifenbark, MD         Discharge Medications: Please see discharge summary for a list of discharge medications.  Relevant Imaging Results:  Relevant Lab Results:   Additional Information SSN:  [id number]  Dede QuerySarah Nayomi Tabron, LCSW

## 2016-04-08 NOTE — Care Management Note (Addendum)
Case Management Note  Patient Details  Name: [name] MRN: [id number] Date of Birth: March 13, 1924  Subjective/Objective:  Met with daughter, [name], HCPOA.. Daughter states patient lives at home alone but has 24 hour care. She has a walker, wheelchair and BSC. Daughter concerned about PT reevaluating patient today to ensure she is safe to go home. She prefers to take her mother home with [facility]. I explained that a referral had been made for nursing and PT. She refused a HHA.  She was appreciative. It is anticipated that patient will discharge today. Marland Kitchen No DME needed.                    Action/Plan: [facility] notified of discharge and need for nursing and PT.    Expected Discharge Date:  04/07/16               Expected Discharge Plan:  [place]  In-House Referral:     Discharge planning Services  CM Consult  Post Acute Care Choice:  Home Health Choice offered to:  [facility] POA / Guardian, Adult Children  DME Arranged:    DME Agency:     HH Arranged:  RN, PT HH Agency:  [place]  Status of Service:  In process, will continue to follow  If discussed at Long Length of Stay Meetings, dates discussed:    Additional Comments:  Colleen Mango, RN 04/08/2016, 8:35 AM

## 2016-04-08 NOTE — Care Management Important Message (Signed)
Important Message  Patient Details  Name: Harvel QualeDorothy L Wynder MRN: [id number] Date of Birth: 04/17/1924   Medicare Important Message Given:  Yes    Marily MemosLisa M Cecilia Vancleve, RN 04/08/2016, 11:06 AM

## 2016-04-08 NOTE — Progress Notes (Signed)
Physical Therapy Treatment Patient Details Name: [name] MRN: [id number] DOB: 08/05/1924 Today's Date: 04/08/2016    History of Present Illness Pt. is a 83 year old female admitted to [facility] on 04/04/16 with hyperkalemia and sepsis secondary to UTI and pneumonia. Pt. has a medical history significant for dementia, HTN and a  R THA. Pt. has been receiving home health PT.     PT Comments    Patient notably fatigued this date, requiring increased level of assist (up to +2) for all functional activities.  Generally weak and unsteady; high risk for LE buckling, LOB. Unable to tolerate any amount of formalized gait training (beyond bed/chair), and consistently requiring up to +2 assist to complete this. Unable to demonstrate ability to safely return home (despite 24/7 caregiver) at this time; recommend transition to STR prior to discharge to promote optimal return to PLOF (and safe performance with +1 assist). Extensive discussion with patient, caregiver and daughter; all in agreement if STR is what is best for patient.  Prefer [facility] if possible.  Social work informed/aware.    Follow Up Recommendations  SNF     Equipment Recommendations       Recommendations for Other Services       Precautions / Restrictions Precautions Precautions: Fall Precaution Comments: Previous ulcer to R heel (wears pressure-relief boot from home when in bed) Restrictions Weight Bearing Restrictions: No    Mobility  Bed Mobility Overal bed mobility: Needs Assistance Bed Mobility: Supine to Sit;Sit to Supine     Supine to sit: Mod assist Sit to supine: Max assist   General bed mobility comments: assist for LE management and truncal elevation; hand-over-hand for UE placement on bedrails  Transfers Overall transfer level: Needs assistance Equipment used: Rolling walker (2 wheeled) Transfers: Sit to/from Stand Sit to Stand: Mod assist;+2 physical assistance         General transfer comment:  tends to pull up on RW, assist for forward weight shift, lift off and standing balance.  Multiple attempts required to complete; fatigues quickly, high risk for LE buckling  Ambulation/Gait Ambulation/Gait assistance: Mod assist;+2 physical assistance Ambulation Distance (Feet): 4 Feet Assistive device: Rolling walker (2 wheeled)       General Gait Details: progressive forward trunk/LE flexion with fatigue; short shuffling steps with poor foot clearance, poor balance (frequent posterior LOB).  step by step verbal cuing for attention to/recall of task.  Increased pain L knee with WBing efforts.   Stairs            Wheelchair Mobility    Modified Rankin (Stroke Patients Only)       Balance Overall balance assessment: Needs assistance Sitting-balance support: Bilateral upper extremity supported Sitting balance-Leahy Scale: Good     Standing balance support: Bilateral upper extremity supported Standing balance-Leahy Scale: Poor Standing balance comment: posterior LOB, mod/max assist to correct and remain upright                    Cognition Arousal/Alertness: Awake/alert Behavior During Therapy: WFL for tasks assessed/performed Overall Cognitive Status: Within Functional Limits for tasks assessed                 General Comments: Poor recall and task comprehension at times (baseline dementia)    Exercises Other Exercises Other Exercises: Sit/stand x4 with RW, mod progressing to max assist as fatigue increases Other Exercises: Bed/chair transfer, SPT with RW (bilat), mod/max assist +2.  After initial transfer, requires +2 assist for optimal  safety due to fatigue/weakness.    General Comments        Pertinent Vitals/Pain Pain Assessment: Faces Faces Pain Scale: Hurts even more Pain Location: L knee Pain Descriptors / Indicators: Aching;Grimacing;Guarding Pain Intervention(s): Limited activity within patient's tolerance;Monitored during  session;Repositioned    Home Living                      Prior Function            PT Goals (current goals can now be found in the care plan section) Acute Rehab PT Goals Patient Stated Goal: To go home PT Goal Formulation: With patient/family Time For Goal Achievement: 04/20/16 Potential to Achieve Goals: Fair Progress towards PT goals: Progressing toward goals    Frequency    Min 2X/week      PT Plan Discharge plan needs to be updated    Co-evaluation             End of Session Equipment Utilized During Treatment: Gait belt Activity Tolerance: Patient limited by fatigue Patient left: in bed;with call bell/phone within reach;with bed alarm set;with family/visitor present   PT Visit Diagnosis: Other abnormalities of gait and mobility (R26.89);Muscle weakness (generalized) (M62.81)     Time: 9147-8295 PT Time Calculation (min) (ACUTE ONLY): 38 min  Charges:  $Therapeutic Activity: 38-52 mins                    G Codes:       Deborra Phegley H. Manson Passey, PT, DPT, NCS 04/08/16, 1:25 PM [phone]

## 2016-04-08 NOTE — Discharge Summary (Addendum)
[facility] [place] [facility]   PATIENT NAME: [name]    MR#:  [id number]  DATE OF BIRTH:  09-07-1924  DATE OF ADMISSION:  04/04/2016 ADMITTING PHYSICIAN: Ramonita Lab, MD  DATE OF DISCHARGE: 04/08/2016  PRIMARY CARE PHYSICIAN: Rolm Gala, MD    ADMISSION DIAGNOSIS:  Hyperkalemia [E87.5] NSTEMI (non-ST elevated myocardial infarction) (HCC) [I21.4] Severe sepsis (HCC) [A41.9, R65.20]  DISCHARGE DIAGNOSIS:  Active Problems:   Hyperkalemia AMS Sepsis PNA  SECONDARY DIAGNOSIS:   Past Medical History:  Diagnosis Date  . Anterolisthesis   . Blood in stool   . Dementia   . Eczema   . Hematuria   . Hypertension   . IBS (irritable bowel syndrome)   . Insomnia   . LVH (left ventricular hypertrophy) due to hypertensive disease   . Osteopenia   . Urethra, polyp   . Vaginal bleeding     HOSPITAL COURSE:   83 year old female with past medical history of dementia, and we'll bowel syndrome, hypertension, osteoporosis who presented to the hospital due to altered mental status and noted to have pneumonia.  1. Altered mental status/metabolic encephalopathy secondary to pneumonia and Acute cystitis. Her symptoms have resolved and she is near baseline. She will continue Levaquin at discharge.   2. Sepsis: Due to pneumonia which is resolved. Blood and urine cultures are negative. She will continue Levaquin  3. Hyperkalemia: Due to acute kidney injury which has improved with improved w/ Kayexylate, D50, Insulin and IV fluid hydration and normalized.   4. Elevated troponin: This is due to demand ischemia and ACS  5. Acute kidney injury: This is due to dehydration and sepsis and has resolved with IV fluids 6. Dementia : She will continue Namenda, Seroquel.    DISCHARGE CONDITIONS AND DIET:    Stable on regular diet for discharge  CONSULTS OBTAINED:    DRUG ALLERGIES:   Allergies  Allergen Reactions  . Azithromycin Itching  . Bactrim  [Sulfamethoxazole-Trimethoprim]   . Other Nausea And Vomiting  . Penicillins Swelling    Has patient had a PCN reaction causing immediate rash, facial/tongue/throat swelling, SOB or lightheadedness with hypotension: Yes Has patient had a PCN reaction causing severe rash involving mucus membranes or skin necrosis: No Has patient had a PCN reaction that required hospitalization No Has patient had a PCN reaction occurring within the last 10 years: Yes If all of the above answers are "NO", then may proceed with Cephalosporin use.  . Sulfa Antibiotics   . Vibramycin [Doxycycline Calcium] Other (See Comments)    Reaction : Unknown    DISCHARGE MEDICATIONS:   Current Discharge Medication List    CONTINUE these medications which have CHANGED   Details  levofloxacin (LEVAQUIN) 750 MG tablet Take 1 tablet (750 mg total) by mouth every other day. Qty: 2 tablet, Refills: 0      CONTINUE these medications which have NOT CHANGED   Details  cyanocobalamin (,VITAMIN B-12,) 1000 MCG/ML injection Inject 1,000 mcg into the muscle every 30 (thirty) days.    folic acid (FOLVITE) 1 MG tablet Take 1 mg by mouth daily.     Iron-Vitamins (GERITOL COMPLETE) TABS Take 1 tablet by mouth daily.     Lifitegrast (XIIDRA) 5 % SOLN Place 1 drop into both eyes 2 (two) times daily.    Magnesium 500 MG TABS Take 1 tablet by mouth daily.    memantine (NAMENDA) 10 MG tablet Take 5 mg by mouth 2 (two) times daily.  PARoxetine (PAXIL) 10 MG tablet Take 10 mg by mouth daily.     potassium chloride (K-DUR,KLOR-CON) 10 MEQ tablet Take 1 tablet (10 mEq total) by mouth daily. For five days starting sunday Qty: 5 tablet, Refills: 0    !! QUEtiapine (SEROQUEL) 25 MG tablet Take 37.5 mg by mouth at bedtime.     !! QUEtiapine (SEROQUEL) 25 MG tablet Take 12.5 mg by mouth daily. At 1600    acetaminophen (TYLENOL) 325 MG tablet Take 2 tablets (650 mg total) by mouth every 6 (six) hours as needed for mild pain (or  Fever >/= 101).    ketoconazole (NIZORAL) 2 % cream Apply 1 application topically daily.     mometasone-formoterol (DULERA) 100-5 MCG/ACT AERO Inhale 2 puffs into the lungs 2 (two) times daily. Qty: 1 Inhaler, Refills: 0    senna (SENOKOT) 8.6 MG TABS tablet Take 1 tablet (8.6 mg total) by mouth 2 (two) times daily. Qty: 120 each, Refills: 0     !! - Potential duplicate medications found. Please discuss with provider.    STOP taking these medications     apixaban (ELIQUIS) 5 MG TABS tablet      ipratropium-albuterol (DUONEB) 0.5-2.5 (3) MG/3ML SOLN      methocarbamol (ROBAXIN) 500 MG tablet      metroNIDAZOLE (FLAGYL) 500 MG tablet      oxyCODONE (OXY IR/ROXICODONE) 5 MG immediate release tablet      saccharomyces boulardii (FLORASTOR) 250 MG capsule           Today   CHIEF COMPLAINT:   Patient is awake and alert no acute events overnight   VITAL SIGNS:  Blood pressure 112/64, pulse 95, temperature 98.2 F (36.8 C), temperature source Oral, resp. rate 18, height 4\' 11"  (1.499 m), weight 56.4 kg (124 lb 6.4 oz), SpO2 97 %.   REVIEW OF SYSTEMS:  Review of Systems  Unable to perform ROS: Dementia     PHYSICAL EXAMINATION:  GENERAL:  83 y.o.-year-old patient lying in the bed with no acute distress.  NECK:  Supple, no jugular venous distention. No thyroid enlargement, no tenderness.  LUNGS: Normal breath sounds bilaterally, no wheezing, rales,rhonchi  No use of accessory muscles of respiration.  CARDIOVASCULAR: S1, S2 normal. No murmurs, rubs, or gallops.  ABDOMEN: Soft, non-tender, non-distended. Bowel sounds present. No organomegaly or mass.  EXTREMITIES: No pedal edema, cyanosis, or clubbing.  PSYCHIATRIC: The patient is alert and oriented x name  SKIN: No obvious rash, lesion, or ulcer.   DATA REVIEW:   CBC  Recent Labs Lab 04/07/16 0508  WBC 9.8  HGB 10.7*  HCT 32.5*  PLT 266    Chemistries   Recent Labs Lab 04/05/16 0559  04/07/16 0508   NA 138  < > 141  K 4.6  < > 3.5  CL 113*  < > 114*  CO2 19*  < > 20*  GLUCOSE 93  < > 100*  BUN 20  < > 14  CREATININE 1.17*  < > 0.88  CALCIUM 8.6*  < > 8.3*  AST 18  --   --   ALT 8*  --   --   ALKPHOS 58  --   --   BILITOT 0.8  --   --   < > = values in this interval not displayed.  Cardiac Enzymes  Recent Labs Lab 04/04/16 1206 04/04/16 2210 04/05/16 0559  TROPONINI 0.26* 0.21* 0.18*    Microbiology Results  @MICRORSLT48 @  RADIOLOGY:  No results  found.    Current Discharge Medication List    CONTINUE these medications which have CHANGED   Details  levofloxacin (LEVAQUIN) 750 MG tablet Take 1 tablet (750 mg total) by mouth every other day. Qty: 2 tablet, Refills: 0      CONTINUE these medications which have NOT CHANGED   Details  cyanocobalamin (,VITAMIN B-12,) 1000 MCG/ML injection Inject 1,000 mcg into the muscle every 30 (thirty) days.    folic acid (FOLVITE) 1 MG tablet Take 1 mg by mouth daily.     Iron-Vitamins (GERITOL COMPLETE) TABS Take 1 tablet by mouth daily.     Lifitegrast (XIIDRA) 5 % SOLN Place 1 drop into both eyes 2 (two) times daily.    Magnesium 500 MG TABS Take 1 tablet by mouth daily.    memantine (NAMENDA) 10 MG tablet Take 5 mg by mouth 2 (two) times daily.     PARoxetine (PAXIL) 10 MG tablet Take 10 mg by mouth daily.     potassium chloride (K-DUR,KLOR-CON) 10 MEQ tablet Take 1 tablet (10 mEq total) by mouth daily. For five days starting sunday Qty: 5 tablet, Refills: 0    !! QUEtiapine (SEROQUEL) 25 MG tablet Take 37.5 mg by mouth at bedtime.     !! QUEtiapine (SEROQUEL) 25 MG tablet Take 12.5 mg by mouth daily. At 1600    acetaminophen (TYLENOL) 325 MG tablet Take 2 tablets (650 mg total) by mouth every 6 (six) hours as needed for mild pain (or Fever >/= 101).    ketoconazole (NIZORAL) 2 % cream Apply 1 application topically daily.     mometasone-formoterol (DULERA) 100-5 MCG/ACT AERO Inhale 2 puffs into the lungs 2  (two) times daily. Qty: 1 Inhaler, Refills: 0    senna (SENOKOT) 8.6 MG TABS tablet Take 1 tablet (8.6 mg total) by mouth 2 (two) times daily. Qty: 120 each, Refills: 0     !! - Potential duplicate medications found. Please discuss with provider.    STOP taking these medications     apixaban (ELIQUIS) 5 MG TABS tablet      ipratropium-albuterol (DUONEB) 0.5-2.5 (3) MG/3ML SOLN      methocarbamol (ROBAXIN) 500 MG tablet      metroNIDAZOLE (FLAGYL) 500 MG tablet      oxyCODONE (OXY IR/ROXICODONE) 5 MG immediate release tablet      saccharomyces boulardii (FLORASTOR) 250 MG capsule           Management plans discussed with the patient\'s daughter is in agreement. Stable for discharge   Patient should follow up with pcp  CODE STATUS:     Code Status Orders        Start     Ordered   04/04/16 1621  Do not attempt resuscitation (DNR)  Continuous    Question Answer Comment  In the event of cardiac or respiratory ARREST Do not call a "code blue"   In the event of cardiac or respiratory ARREST Do not perform Intubation, CPR, defibrillation or ACLS   In the event of cardiac or respiratory ARREST Use medication by any route, position, wound care, and other measures to relive pain and suffering. May use oxygen, suction and manual treatment of airway obstruction as needed for comfort.   Comments RN may pronounce      03 /01/18 1620    Code Status History    Date Active Date Inactive Code Status Order ID Comments User Context   07/26/2015  2:09 PM 07/29/2015  6:07 PM Full Code [id number]  Juanell Fairly, MD Inpatient   07/22/2015  3:22 AM 07/24/2015  5:35 PM Full Code [id number]  Juanell Fairly, MD Inpatient   07/22/2015  1:59 AM 07/22/2015  1:59 AM Full Code [id number]  Joella Prince, MD ED   07/22/2015  1:59 AM 07/22/2015  3:22 AM Full Code [id number]  Joella Prince, MD ED   02/09/2015 11:52 AM 02/09/2015  6:06 PM DNR [id number]  Gale Journey, MD Inpatient   02/05/2015  8:28 PM  02/09/2015 11:52 AM Full Code [id number]  Wyatt Haste, MD ED      TOTAL TIME TAKING CARE OF THIS PATIENT: 38 minutes.    Note: This dictation was prepared with Dragon dictation along with smaller phrase technology. Any transcriptional errors that result from this process are unintentional.  Nelwyn Hebdon M.D on 04/08/2016 at 10:10 AM  Between 7am to 6pm - Pager - [phone] After 6pm go to [email or web address] - Social research officer, government  [facility]  Office  [phone]  CC: Primary care physician; Rolm Gala, MD

## 2016-04-08 NOTE — Care Management (Signed)
Daughter to transfer patient home by car

## 2016-04-09 LAB — CULTURE, BLOOD (ROUTINE X 2)
CULTURE: NO GROWTH
Culture: NO GROWTH

## 2016-04-10 DIAGNOSIS — M940 Chondrocostal junction syndrome [Tietze]: Secondary | ICD-10-CM

## 2016-04-10 DIAGNOSIS — J189 Pneumonia, unspecified organism: Secondary | ICD-10-CM | POA: Diagnosis not present

## 2016-04-15 DIAGNOSIS — E875 Hyperkalemia: Secondary | ICD-10-CM | POA: Diagnosis not present

## 2016-04-15 DIAGNOSIS — F39 Unspecified mood [affective] disorder: Secondary | ICD-10-CM | POA: Diagnosis not present

## 2016-04-15 DIAGNOSIS — J189 Pneumonia, unspecified organism: Secondary | ICD-10-CM | POA: Diagnosis not present

## 2016-04-15 DIAGNOSIS — F0150 Vascular dementia without behavioral disturbance: Secondary | ICD-10-CM | POA: Diagnosis not present

## 2016-12-22 ENCOUNTER — Emergency Department: Payer: Medicare Other

## 2016-12-22 ENCOUNTER — Emergency Department
Admission: EM | Admit: 2016-12-22 | Discharge: 2016-12-23 | Disposition: A | Discharge status: Home / Self Care | Payer: Medicare Other | Attending: Emergency Medicine | Admitting: Emergency Medicine

## 2016-12-22 DIAGNOSIS — Z79899 Other long term (current) drug therapy: Secondary | ICD-10-CM | POA: Diagnosis not present

## 2016-12-22 DIAGNOSIS — I10 Essential (primary) hypertension: Secondary | ICD-10-CM | POA: Diagnosis not present

## 2016-12-22 DIAGNOSIS — R079 Chest pain, unspecified: Secondary | ICD-10-CM | POA: Diagnosis present

## 2016-12-22 DIAGNOSIS — R1010 Upper abdominal pain, unspecified: Secondary | ICD-10-CM | POA: Diagnosis not present

## 2016-12-22 DIAGNOSIS — F0390 Unspecified dementia without behavioral disturbance: Secondary | ICD-10-CM | POA: Insufficient documentation

## 2016-12-22 DIAGNOSIS — N390 Urinary tract infection, site not specified: Secondary | ICD-10-CM

## 2016-12-22 DIAGNOSIS — R109 Unspecified abdominal pain: Secondary | ICD-10-CM

## 2016-12-22 LAB — TROPONIN I: Troponin I: <0.03 ng/mL (ref <0.03)

## 2016-12-22 LAB — URINALYSIS, COMPLETE (UACMP) WITH MICROSCOPIC
Bilirubin Urine: NEGATIVE
Glucose, UA: NEGATIVE mg/dL
HGB URINE DIPSTICK: NEGATIVE
Ketones, ur: NEGATIVE mg/dL
NITRITE: NEGATIVE
Protein, ur: NEGATIVE mg/dL
SPECIFIC GRAVITY, URINE: 1.009 (ref 1.005–1.030)
SQUAMOUS EPITHELIAL / LPF: NONE SEEN
pH: 5 (ref 5.0–8.0)

## 2016-12-22 LAB — COMPREHENSIVE METABOLIC PANEL
ALK PHOS: 81 U/L (ref 38–126)
ALT: 12 U/L — ABNORMAL LOW (ref 14–54)
ANION GAP: 12 (ref 5–15)
AST: 28 U/L (ref 15–41)
Albumin: 4.4 g/dL (ref 3.5–5.0)
BILIRUBIN TOTAL: 0.7 mg/dL (ref 0.3–1.2)
BUN: 15 mg/dL (ref 6–20)
CALCIUM: 9.8 mg/dL (ref 8.9–10.3)
CO2: 25 mmol/L (ref 22–32)
Chloride: 99 mmol/L — ABNORMAL LOW (ref 101–111)
Creatinine, Ser: 0.97 mg/dL (ref 0.44–1.00)
GFR, EST AFRICAN AMERICAN: 58 mL/min — AB (ref >60)
GFR, EST NON AFRICAN AMERICAN: 50 mL/min — AB (ref >60)
Glucose, Bld: 113 mg/dL — ABNORMAL HIGH (ref 65–99)
Potassium: 3.4 mmol/L — ABNORMAL LOW (ref 3.5–5.1)
Sodium: 136 mmol/L (ref 135–145)
TOTAL PROTEIN: 7.5 g/dL (ref 6.5–8.1)

## 2016-12-22 LAB — LIPASE, BLOOD: LIPASE: 34 U/L (ref 11–51)

## 2016-12-22 LAB — CBC WITH DIFFERENTIAL/PLATELET
Basophils Absolute: 0.1 10*3/uL (ref 0–0.1)
Basophils Relative: 1 %
EOS PCT: 4 %
Eosinophils Absolute: 0.5 10*3/uL (ref 0–0.7)
HEMATOCRIT: 43.4 % (ref 35.0–47.0)
Hemoglobin: 14.1 g/dL (ref 12.0–16.0)
LYMPHS PCT: 19 %
Lymphs Abs: 2.4 10*3/uL (ref 1.0–3.6)
MCH: 28.4 pg (ref 26.0–34.0)
MCHC: 32.6 g/dL (ref 32.0–36.0)
MCV: 87.1 fL (ref 80.0–100.0)
MONO ABS: 0.9 10*3/uL (ref 0.2–0.9)
MONOS PCT: 7 %
NEUTROS ABS: 8.9 10*3/uL — AB (ref 1.4–6.5)
Neutrophils Relative %: 69 %
Platelets: 307 10*3/uL (ref 150–440)
RBC: 4.98 MIL/uL (ref 3.80–5.20)
RDW: 15 % — AB (ref 11.5–14.5)
WBC: 12.8 10*3/uL — ABNORMAL HIGH (ref 3.6–11.0)

## 2016-12-22 MED ORDER — FENTANYL CITRATE (PF) 100 MCG/2ML IJ SOLN
25.0000 ug | Freq: Once | INTRAMUSCULAR | Status: AC
  Administered 2016-12-22: 25 ug via INTRAVENOUS
  Filled 2016-12-22: qty 2

## 2016-12-22 NOTE — ED Notes (Signed)
Patient transported to CT 

## 2016-12-22 NOTE — ED Triage Notes (Signed)
EMS chest pain from home room 5

## 2016-12-22 NOTE — ED Triage Notes (Signed)
3 x nitro SL, no ASA given

## 2016-12-22 NOTE — ED Provider Notes (Addendum)
[facility] Emergency Department Provider Note  ____________________________________________   I have reviewed the triage vital signs and the nursing notes.   HISTORY  Chief Complaint Chest Pain    HPI [name] is a 83 y.o. female who presents today complaining of abdominal pain in the right abdominal region, upper.  Has had this pain she states before.  Cannot recall if she had a cholecystectomy.  It is not listed on my notes.  Patient does also has a history of irritable bowel syndrome with recurrent abdominal pain.  She denies any other alleviating or aggravating symptoms.  Say "achy pain" began gradually about 1/2-hour ago.  No vomiting no fever no chest pain or shortness of breath, the pain is underneath the right breast in her abdominal region.  She denies any fever or chills.  Nothing makes it better nothing makes it worse she did have nitroglycerin en route from EMS and it did nothing to alleviate the symptoms of does not feel like cardiac pain to the patient.     Past Medical History:  Diagnosis Date  . Anterolisthesis   . Blood in stool   . Dementia   . Eczema   . Hematuria   . Hypertension   . IBS (irritable bowel syndrome)   . Insomnia   . LVH (left ventricular hypertrophy) due to hypertensive disease   . Osteopenia   . Urethra, polyp   . Vaginal bleeding     Patient Active Problem List   Diagnosis Date Noted  . Hyperkalemia 04/04/2016  . Pressure ulcer 07/28/2015  . Closed right hip fracture (HCC) 07/22/2015  . Hip fracture (HCC) 07/22/2015  . Acute pulmonary embolism (HCC) 02/09/2015  . Malnutrition of moderate degree 02/07/2015  . Hypokalemia 02/05/2015  . SPL (spondylolisthesis) 07/28/2014  . Osteopenia 07/28/2014  . Poor balance 07/28/2014  . Adynamia 07/28/2014  . Hypertensive left ventricular hypertrophy 12/21/2013  . Disorder of rotator cuff 07/09/2013  . Dementia 01/25/2013  . Regurgitation, of nonorganic origin,  of food with reswallowing 01/25/2013  . Blood pressure elevated 12/28/2012  . Amnesia 11/19/2010  . Dermatitis, eczematoid 08/20/2010  . Difficulty hearing 08/20/2010  . Adaptive colitis 08/20/2010  . Cannot sleep 08/20/2010  . Affective disorder (HCC) 08/20/2010    Past Surgical History:  Procedure Laterality Date  . ARTHROPLASTY BIPOLAR HIP (HEMIARTHROPLASTY) Right 07/26/2015   Performed by Juanell FairlyKrasinski, Kevin, [facility]  . CANCELLED PROCEDURE  07/24/2015   [facility]formed [facility]Krasinski, Kevin[facility]    Prior to Admission medications   Medication Sig Start Date End Date Taking? Authorizing Provider  acetaminophen (TYLENOL) 325 MG tablet Take 2 tablets (650 mg total) by mouth every 6 (six) hours as needed for mild pain (or Fever >/= 101). 07/28/15   Gouru, Aruna, MD  cyanocobalamin (,VITAMIN B-12,) 1000 MCG/ML injection Inject 1,000 mcg into the muscle every 30 (thirty) days.    [provider]  folic acid (FOLVITE) 1 MG tablet Take 1 mg by mouth daily.     [provider]  Iron-Vitamins (GERITOL COMPLETE) TABS Take 1 tablet by mouth daily.     [provider]  ketoconazole (NIZORAL) 2 % cream Apply 1 application topically daily.     [provider]  levofloxacin (LEVAQUIN) 750 MG tablet Take 1 tablet (750 mg total) by mouth every other day. 04/10/16   Adrian SaranMody, Sital, MD  Lifitegrast Benay Spice(XIIDRA) 5 % SOLN Place 1 drop into both eyes 2 (two) times daily.  [provider]  Magnesium 500 MG TABS Take 1 tablet by mouth daily.    [provider]  memantine (NAMENDA) 10 MG tablet Take 5 mg by mouth 2 (two) times daily.  07/26/14   [provider]  mometasone-formoterol (DULERA) 100-5 MCG/ACT AERO Inhale 2 puffs into the lungs 2 (two) times daily. Patient not taking: Reported on 07/22/2015 02/09/15   Gale Journey, MD  PARoxetine (PAXIL) 10 MG tablet Take 10 mg by mouth daily.     [provider]  potassium chloride  (K-DUR,KLOR-CON) 10 MEQ tablet Take 1 tablet (10 mEq total) by mouth daily. For five days starting sunday Patient taking differently: Take 20 mEq by mouth daily. For five days starting sunday 07/30/15   Alford Highland, MD  QUEtiapine (SEROQUEL) 25 MG tablet Take 37.5 mg by mouth at bedtime.     [provider]  QUEtiapine (SEROQUEL) 25 MG tablet Take 12.5 mg by mouth daily. At 1600    [provider]  senna (SENOKOT) 8.6 MG TABS tablet Take 1 tablet (8.6 mg total) by mouth 2 (two) times daily. 07/28/15   Ramonita Lab, MD    Allergies Azithromycin; Bactrim [sulfamethoxazole-trimethoprim]; Other; Penicillins; Sulfa antibiotics; and Vibramycin [doxycycline calcium]  No family history on file.  Social History Social History   Tobacco Use  . Smoking status: Never Smoker  . Smokeless tobacco: Never Used  Substance Use Topics  . Alcohol use: No  . Drug use: No    Review of Systems Constitutional: No fever/chills Eyes: No visual changes. ENT: No sore throat. No stiff neck no neck pain Cardiovascular: Denies chest pain. Respiratory: Denies shortness of breath. Gastrointestinal:   no vomiting.  No diarrhea.  No constipation. Genitourinary: Negative for dysuria. Musculoskeletal: Negative lower extremity swelling Skin: Negative for rash. Neurological: Negative for severe headaches, focal weakness or numbness.   ____________________________________________   PHYSICAL EXAM:  VITAL SIGNS: ED Triage Vitals [12/22/16 2057]  Enc Vitals Group     BP (!) 135/100     Pulse Rate 99     Resp 19     Temp 97.9 F (36.6 C)     Temp Source Oral     SpO2 94 %     Weight 135 lb (61.2 kg)     Height 5\' 4"  (1.626 m)     Head Circumference      Peak Flow      Pain Score      Pain Loc      Pain Edu?      Excl. in GC?     Constitutional: Alert and oriented. Well appearing and in no acute distress. Eyes: Conjunctivae are normal Head: Atraumatic HEENT: No  congestion/rhinnorhea. Mucous membranes are moist.  Oropharynx non-erythematous Neck:   Nontender with no meningismus, no masses, no stridor Cardiovascular: Normal rate, regular rhythm. Grossly normal heart sounds.  Good peripheral circulation. Respiratory: Normal respiratory effort.  No retractions. Lungs CTAB. Abdominal: Soft and palpation the right sided upper abdomen with no guarding or rebound no distention. No guarding no rebound Back:  There is no focal tenderness or step off.  there is no midline tenderness there are no lesions noted. there is no CVA tenderness Musculoskeletal: No lower extremity tenderness, no upper extremity tenderness. No joint effusions, no DVT signs strong distal pulses no edema Neurologic:  Normal speech and language. No gross focal neurologic deficits are appreciated.  Skin:  Skin is warm, dry and intact. No rash noted. Psychiatric: Mood and affect  are normal. Speech and behavior are normal.  ____________________________________________   LABS (all labs ordered are listed, but only abnormal results are displayed)  Labs Reviewed  CBC WITH DIFFERENTIAL/PLATELET  TROPONIN I  COMPREHENSIVE METABOLIC PANEL  URINALYSIS, COMPLETE (UACMP) WITH MICROSCOPIC  LIPASE, BLOOD    Pertinent labs  results that were available during my care of the patient were reviewed by me and considered in my medical decision making (see chart for details). ____________________________________________  EKG  I personally interpreted any EKGs ordered by me or triage Sinus rhythm rate 96 bpm no acute ST elevation or depression nonspecific ST changes noted, ____________________________________________  RADIOLOGY  Pertinent labs & imaging results that were available during my care of the patient were reviewed by me and considered in my medical decision making (see chart for details). If possible, patient and/or family made aware of any abnormal findings.  No results  found. ____________________________________________    PROCEDURES  Procedure(s) performed: None  Procedures  Critical Care performed: None  ____________________________________________   INITIAL IMPRESSION / ASSESSMENT AND PLAN / ED COURSE  Pertinent labs & imaging results that were available during my care of the patient were reviewed by me and considered in my medical decision making (see chart for details).  Vision here with right-sided abdominal pain which is reproducible, differential colitis, AAA, constipation, irritable bowel syndrome, gallbladder disease, etc.  I will obtain CT scan of abdomen and pelvis, we will obtain chest x-ray and troponin as a precaution of the low suspicion for ACS we will give her pain medications we will watch her closely vital signs stable thus far  ----------------------------------------- 12:06 AM on 12/23/2016 -----------------------------------------  Family bedside, they state "she gets these pains sometimes".  Patient has no ongoing pain.  She has no dysuria, multiple different urinary tract infection workups have been unremarkable.  I do not think that the trace bacteria in her urine is causing her to have recurrent chronic right upper quadrant abdominal pain/right sided abdominal pain.  No flank pain.  Patient has multiple different antibiotic allergies and no symptoms of urinary tract infection we will send a culture in lieu of acute treatment.  Patient in no acute distress at this time abdomen is completely benign.  CT and diffuse workup is reassuring, no evidence of ischemic gut or other intra-abdominal pathology, small trace pleural effusion on the left likely chronic certainly not contributing to her symptoms today and no evidence of pneumonia.  Nothing to suggest PE for reproducible chronic recurrent abdominal pain.  Patient does have some stool in that area and states she has been slightly constipated.  In any event she is at her baseline  family would prefer not to have her admitted.  We will send a second troponin and if as anticipated that is negative we will discharge.  Family and patient very comfortable with this plan.  Signed out to dr. Dolores FrameSung at the end of my shift.  No evidence of gallbladder dysfunction on liver function tests or on CT.  No ongoing pain I do not think ultrasound is indicated.  Considering the patient's symptoms, medical history, and physical examination today, I have low suspicion for cholecystitis or biliary pathology, pancreatitis, perforation or bowel obstruction, hernia, intra-abdominal abscess, AAA or dissection, volvulus or intussusception, mesenteric ischemia, ischemic gut, pyelonephritis or appendicitis.  At this time, there does not appear to be clinical evidence to support the diagnosis of pulmonary embolus, dissection, myocarditis, endocarditis, pericarditis, pericardial tamponade, acute coronary syndrome, pneumothorax, pneumonia, or any other acute  intrathoracic pathology that will require admission or acute intervention. Nor is there evidence of any significant intra-abdominal pathology causing this discomfort.    ____________________________________________   FINAL CLINICAL IMPRESSION(S) / ED DIAGNOSES  Final diagnoses:  None      This chart was dictated using voice recognition software.  Despite best efforts to proofread,  errors can occur which can change meaning.      Jeanmarie Plant, MD 12/22/16 2109    Jeanmarie Plant, MD 12/23/16 1610    Jeanmarie Plant, MD 12/23/16 [phone]

## 2016-12-23 DIAGNOSIS — R1010 Upper abdominal pain, unspecified: Secondary | ICD-10-CM | POA: Diagnosis not present

## 2016-12-23 LAB — TROPONIN I

## 2016-12-23 MED ORDER — NITROFURANTOIN MONOHYD MACRO 100 MG PO CAPS: 100.0000 mg | ORAL_CAPSULE | Freq: Two times a day (BID) | ORAL | 0 refills | Status: DC

## 2016-12-23 MED ORDER — NITROFURANTOIN MONOHYD MACRO 100 MG PO CAPS
100.0000 mg | ORAL_CAPSULE | Freq: Once | ORAL | Status: AC
  Administered 2016-12-23: 100 mg via ORAL
  Filled 2016-12-23: qty 1

## 2016-12-23 NOTE — ED Provider Notes (Signed)
-----------------------------------------   1:06 AM on 12/23/2016 -----------------------------------------  Repeat troponin remains negative.  Patient resting comfortably without complaints.  Will discharge home per Dr. Iline OvenMcShane's plan with follow-up with her PCP early next week.  Prescription for Macrobid for UTI.  Strict return precautions given.  Patient and family verbalized understanding and agree with plan of care.   Irean HongSung, Porfiria Heinrich J, MD 12/23/16 [phone]

## 2016-12-23 NOTE — Discharge Instructions (Addendum)
Take antibiotic as prescribed for UTI.  Return to the emergency room for any new or worrisome symptoms including chest pain or shortness of breath

## 2016-12-23 NOTE — ED Notes (Signed)
Pt placed into vehicle by this RN after toileting pt with daughter

## 2016-12-25 LAB — URINE CULTURE: Culture: >=100000 — AB

## 2016-12-26 NOTE — Progress Notes (Signed)
ED CULTURE REPORT  83 yo female seen in ED with UTI. Patient was discharged with nitrofurantoin for 5 days. ED culture report showed MRSA that was sensitive to nitrofurantoin. Spoke with Dr. Derrill KayGoodman who agreed that no further action was needed.   Results for orders placed or performed during the hospital encounter of 12/22/16  Urine culture     Status: Abnormal   Collection Time: 12/22/16 10:26 PM  Result Value Ref Range Status   Specimen Description URINE, CATHETERIZED  Final   Special Requests NONE  Final   Culture (A)  Final    >=100,000 COLONIES/mL METHICILLIN RESISTANT STAPHYLOCOCCUS AUREUS   Report Status 12/25/2016 FINAL  Final   Organism ID, Bacteria METHICILLIN RESISTANT STAPHYLOCOCCUS AUREUS (A)  Final      Susceptibility   Methicillin resistant staphylococcus aureus - MIC*    CIPROFLOXACIN >=8 RESISTANT Resistant     GENTAMICIN <=0.5 SENSITIVE Sensitive     NITROFURANTOIN <=16 SENSITIVE Sensitive     OXACILLIN >=4 RESISTANT Resistant     TETRACYCLINE <=1 SENSITIVE Sensitive     VANCOMYCIN 1 SENSITIVE Sensitive     TRIMETH/SULFA <=10 SENSITIVE Sensitive     CLINDAMYCIN <=0.25 SENSITIVE Sensitive     RIFAMPIN <=0.5 SENSITIVE Sensitive     Inducible Clindamycin NEGATIVE Sensitive     * >=100,000 COLONIES/mL METHICILLIN RESISTANT STAPHYLOCOCCUS AUREUS    Yolanda BonineHannah Angelly Spearing, PharmD Pharmacy Resident

## 2017-06-25 IMAGING — CR DG CHEST 2V
3 series · 3 of 3 positions shown · non-contrast
Comparison: Chest radiograph performed 07/26/2015

CLINICAL DATA: Acute onset of cough and generalized weakness.
Initial encounter.

EXAM:
CHEST  2 VIEW

[chest lat]
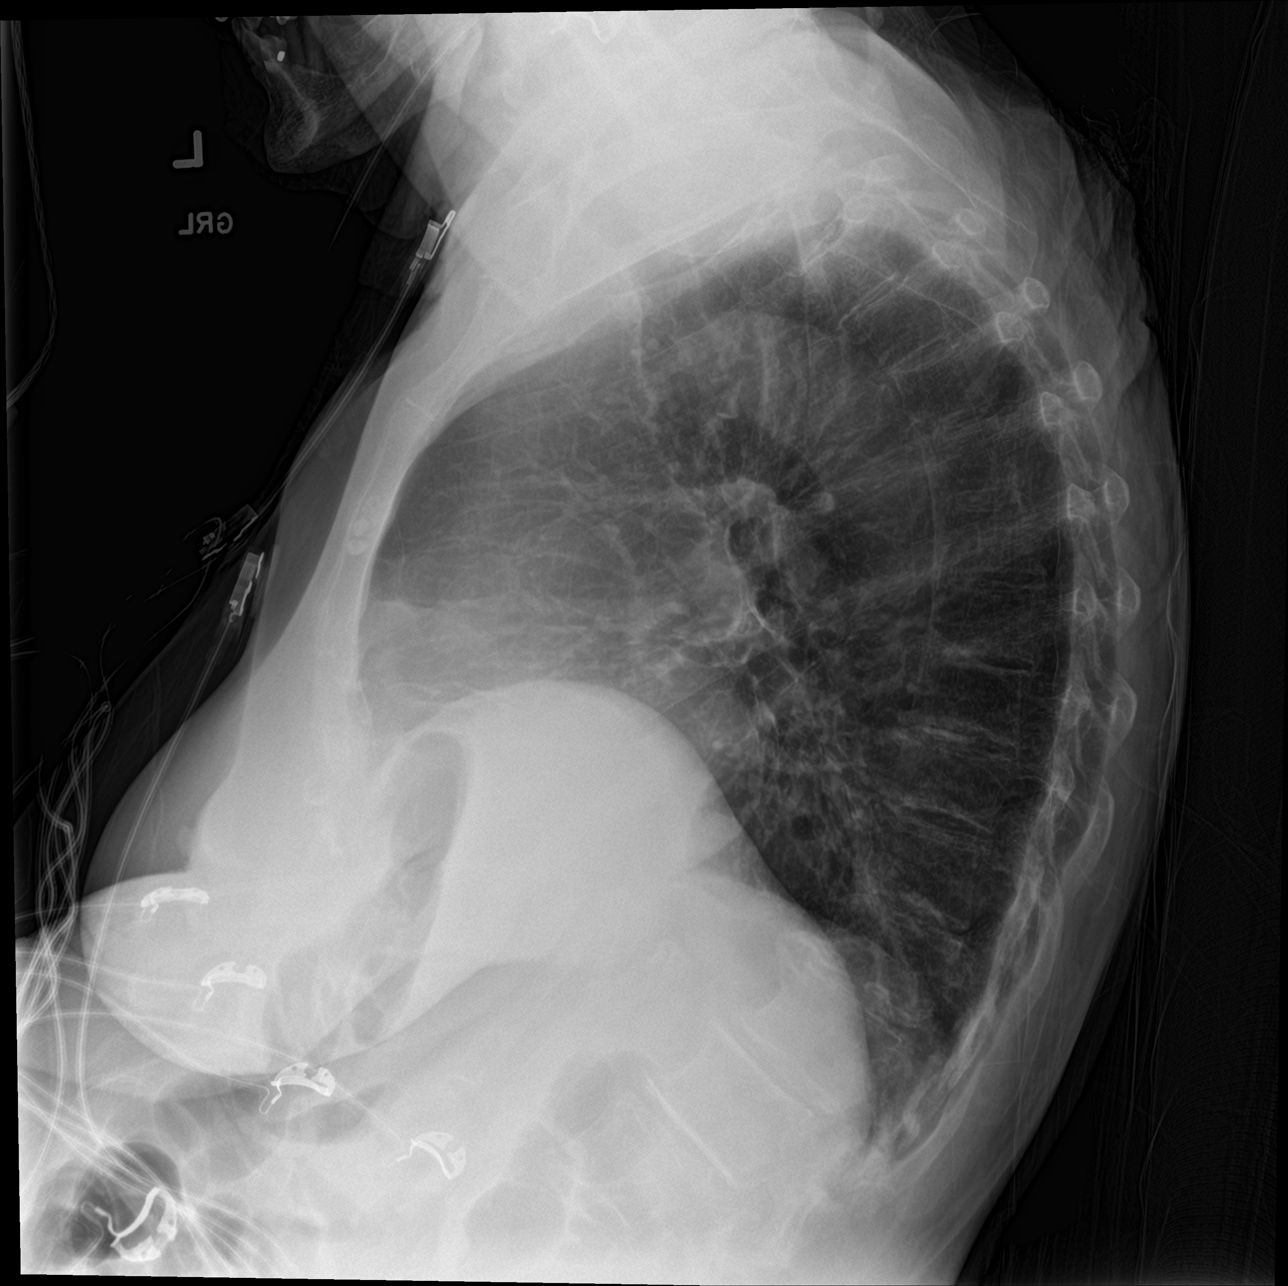

[chest ap (1 of 2)]
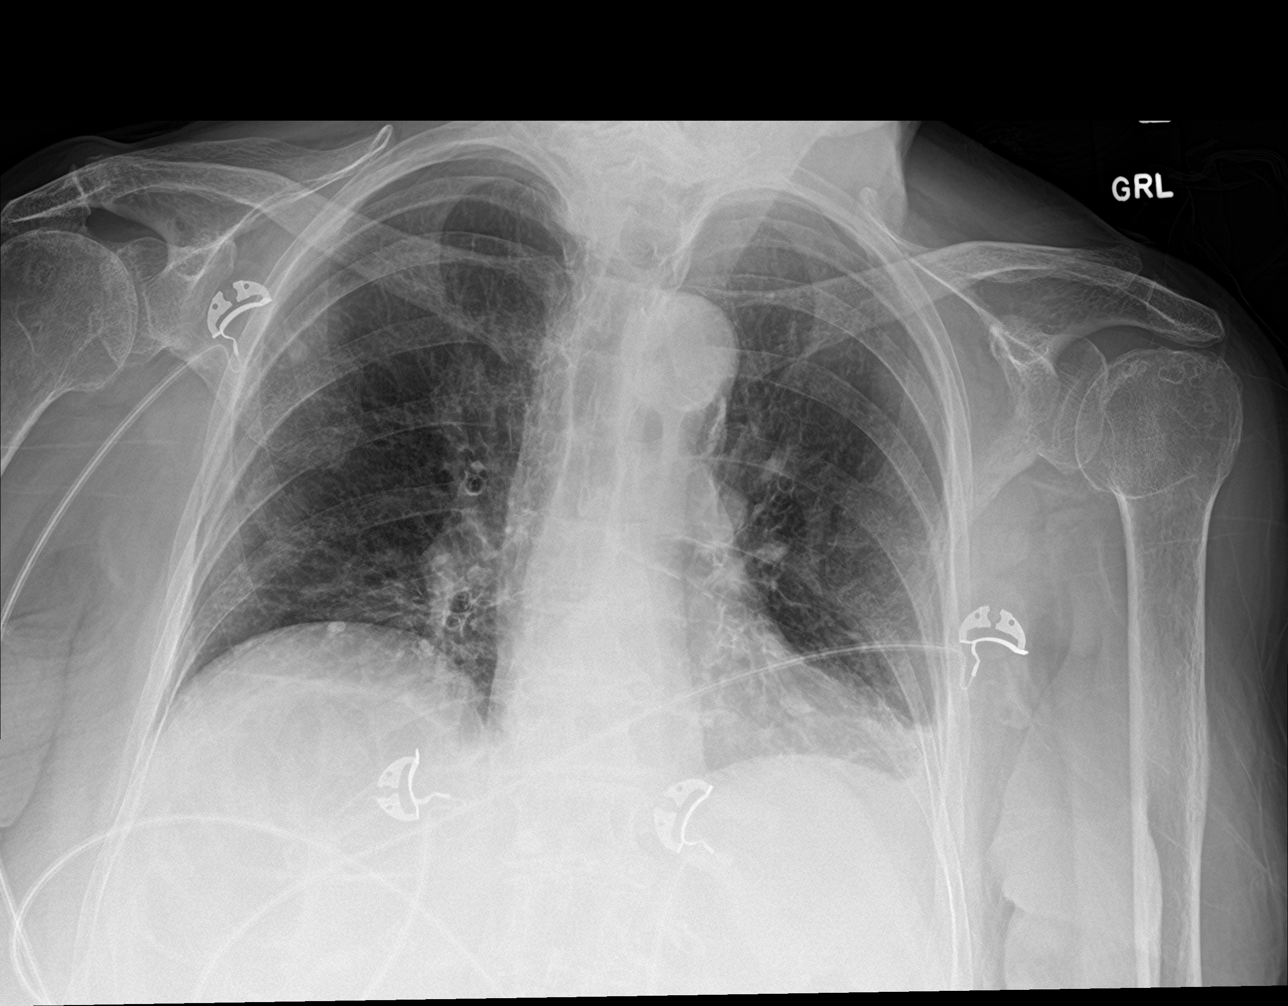

[chest ap (2 of 2)]
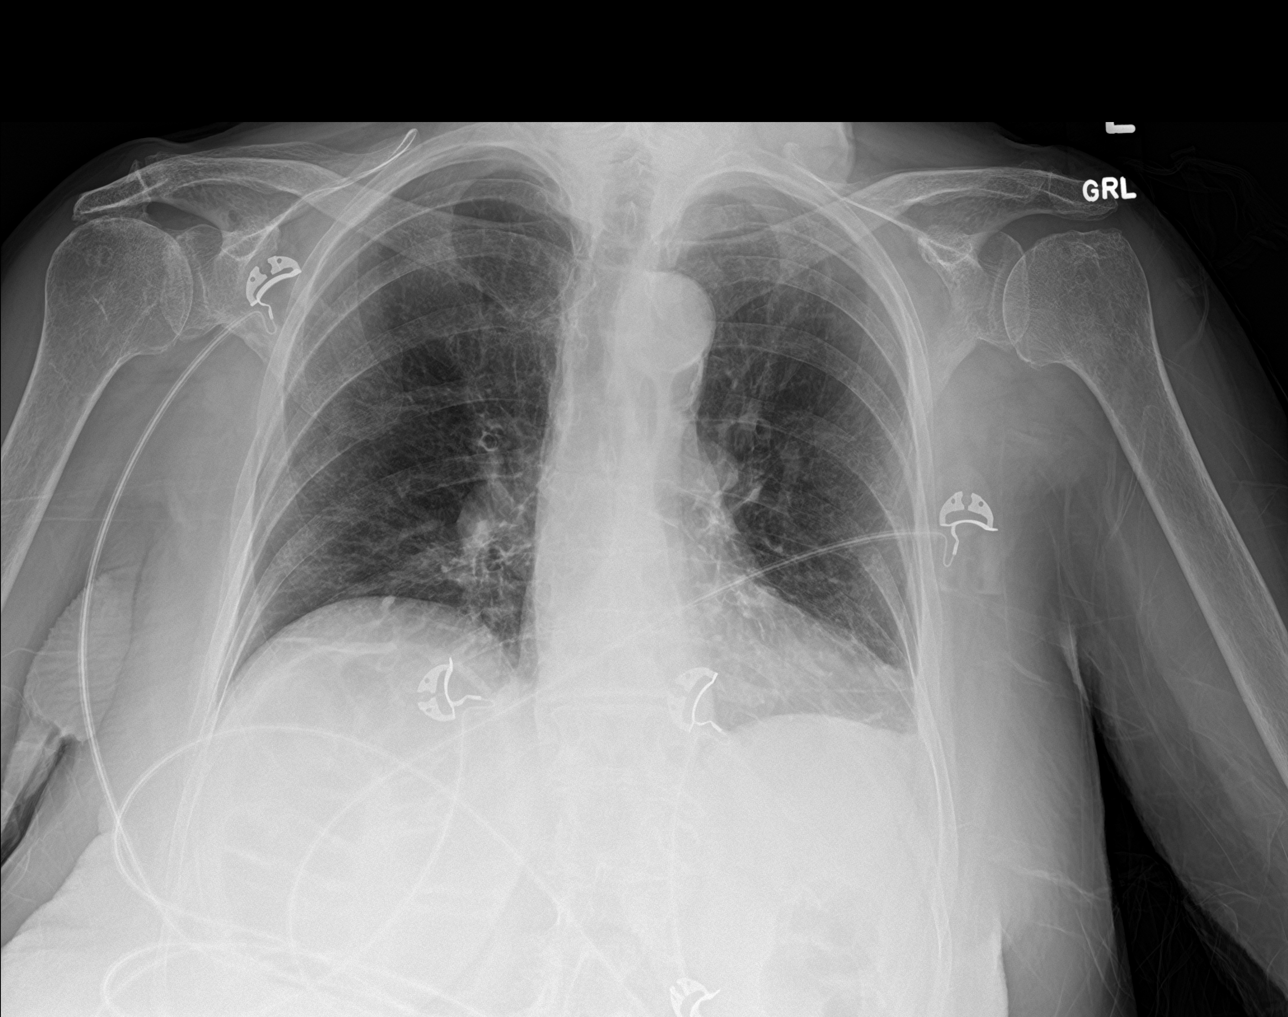

[3 of 3 positions shown; findings below may reference images not displayed]

FINDINGS: The lungs are well-aerated. A small left pleural effusion is noted.
Peribronchial thickening is seen. Minimal bibasilar atelectasis is
noted. There is no evidence of pleural effusion or pneumothorax.

The heart is borderline enlarged. No acute osseous abnormalities are
seen.
IMPRESSION: Small left pleural effusion. Peribronchial thickening seen. Minimal
bibasilar atelectasis noted. Borderline cardiomegaly.

## 2021-04-20 ENCOUNTER — Telehealth: Payer: Self-pay | Admitting: Primary Care

## 2021-04-20 NOTE — Telephone Encounter (Signed)
Spoke with patient's daughter Rami Waddle, regarding the Palliative referral/services and all questions were answered and she was in agreement with scheduling visit.  I have scheduled an In-home Consult for 04/24/21 @ 11:30 AM ?

## 2021-04-24 ENCOUNTER — Other Ambulatory Visit: Payer: Self-pay

## 2021-04-24 ENCOUNTER — Other Ambulatory Visit: Payer: Medicare Other | Admitting: Primary Care

## 2021-04-24 DIAGNOSIS — Z515 Encounter for palliative care: Secondary | ICD-10-CM

## 2021-04-24 DIAGNOSIS — E440 Moderate protein-calorie malnutrition: Secondary | ICD-10-CM

## 2021-04-24 DIAGNOSIS — R2689 Other abnormalities of gait and mobility: Secondary | ICD-10-CM

## 2021-04-24 NOTE — Progress Notes (Signed)
 Therapist, nutritional Palliative Care Consult Note Telephone: [phone]  Fax: [phone]   Date of encounter: 04/24/21 12:02 PM PATIENT NAME: [name] [address] [place] [address]   [phone] (home)  DOB: Oct 30, 1924 MRN: [id number] PRIMARY CARE PROVIDER:    Rojelio Loader, MD,  [address], [address] [place] [address] [phone]  REFERRING PROVIDER:   Rojelio Loader, MD [address] [place],  [place] [address] [phone]  RESPONSIBLE PARTY:    Contact Information     Name Relation Home Work Mobile   [place] Daughter [phone]  [phone]   Quin Sober Daughter [phone]  [phone]   Arvin Service Daughter [phone]  [phone]       I met face to face with patient and family in  home. Palliative Care was asked to follow this patient by consultation request of  Rojelio Loader, MD to address advance care planning and complex medical decision making. This is the initial visit.                                     ASSESSMENT AND PLAN / RECOMMENDATIONS:   Advance Care Planning/Goals of Care: Goals include to maximize quality of life and symptom management. Patient/health care surrogate gave his/her permission to discuss.Our advance care planning conversation included a discussion about:    The value and importance of advance care planning  Experiences with loved ones who have been seriously ill or have died  Exploration of personal, cultural or spiritual beliefs that might influence medical decisions  Exploration of goals of care in the event of a sudden injury or illness  Identification of a healthcare agent - daughter in home Service Clause Review and  creation of an  advance directive document . Decision not to resuscitate due to poor prognosis. POA has changed recently due to one daughter also having dementia. CODE STATUS: Dnr  I completed a MOST form today. The patient and family outlined their  wishes for the following treatment decisions:  Cardiopulmonary Resuscitation: Do Not Attempt Resuscitation (DNR/No CPR)  Medical Interventions: Comfort Measures: Keep clean, warm, and dry. Use medication by any route, positioning, wound care, and other measures to relieve pain and suffering. Use oxygen, suction and manual treatment of airway obstruction as needed for comfort. Do not transfer to the hospital unless comfort needs cannot be met in current location.  Antibiotics: Antibiotics if indicated  IV Fluids: IV fluids for a defined trial period  Feeding Tube: No feeding tube    Symptom Management/Plan:  Nutrition: Eats full breakfast with breakfast bars, pop tarts, 4 pieces bacon. Can self feed finger foods. Will not take ensure, likes tea only. Drinks coffee and water.  Eats lunch sometimes, but sometimes nothing at supper.  Overall on day 50% intake.   Fatigue: sleeps 17 hrs daily usually. She endorses fatigue and goes back to sleep frequently  Mobility: Has recently stopped helping with standing and transfers in past few months. Prior was able to stand and pivot with 2 assist. Now does not seem to be able to bear weight. FAST score 7 C on assessment today. However pt is somnolent and I will assess on future visits.  Caregiver strain: Has live in caregivers, has recently been d/c from hospice.   Follow up Palliative Care Visit: Palliative care will continue to follow for complex medical decision making, advance care planning, and clarification  of goals. Return 6 weeks or prn.  I spent 60 minutes providing this consultation. More than 50% of the time in this consultation was spent in counseling and care coordination.  PPS: 30%  HOSPICE ELIGIBILITY/DIAGNOSIS: recent d/c for stability  Chief Complaint: decreased oral intake, debility  HISTORY OF PRESENT ILLNESS:  [name] is a 88 y.o. year old female  with advanced dementia, debility, immobility, decreased oral intake. Recently  d/c from hospice for stability. Has live in caregivers and family support . Patient seen today to review palliative care needs to include medical decision making and advance care planning as appropriate.   History obtained from review of EMR, discussion with primary team, and interview with family, facility staff/caregiver and/or Ms. Snell.  I reviewed available labs, medications, imaging, studies and related documents from the EMR.  Records reviewed and summarized above.   ROS  General: NAD EYES: denies vision changes, does not open eyes ENMT: denies dysphagia but sometimes refuses foods. Cardiovascular: denies chest pain, denies DOE Pulmonary: denies cough, denies increased SOB Abdomen: endorses good  to fair appetite, denies constipation, has IBS and loose stools, endorses  incontinence of bowel GU: denies dysuria, endorses incontinence of urine MSK:  endorses  increased weakness,  no falls reported Skin: denies rashes or wounds Neurological: denies pain, denies insomnia, sleeps > 17 hrs / daily  Psych: Endorses  flat mood Heme/lymph/immuno: denies bruises, abnormal bleeding  Physical Exam: Current and past weights: unavailable Constitutional: NAD General: frail appearing, thin ENMT: intact hearing,  CV: S1S2, RRR, no LE edema Pulmonary: LCTA, no increased work of breathing, no cough, room air Abdomen: intake 50%, normo-active BS + 4 quadrants, soft and non tender, no ascites GU: deferred MSK: ++ sarcopenia, moves all extremities, non ambulatory Skin: warm and dry, no rashes or wounds on visible skin Neuro:  +generalized weakness,  advanced  cognitive impairment Psych: non-anxious affect, A and O x 1 Hem/lymph/immuno: no widespread bruising CURRENT PROBLEM LIST:  Patient Active Problem List   Diagnosis Date Noted   Hyperkalemia 04/04/2016   Pressure ulcer 07/28/2015   Closed right hip fracture (HCC) 07/22/2015   Hip fracture (HCC) 07/22/2015   Acute pulmonary embolism  (HCC) 02/09/2015   Malnutrition of moderate degree 02/07/2015   Hypokalemia 02/05/2015   SPL (spondylolisthesis) 07/28/2014   Osteopenia 07/28/2014   Poor balance 07/28/2014   Adynamia 07/28/2014   Hypertensive left ventricular hypertrophy 12/21/2013   Disorder of rotator cuff 07/09/2013   Dementia (HCC) 01/25/2013   Regurgitation, of nonorganic origin, of food with reswallowing 01/25/2013   Blood pressure elevated 12/28/2012   Amnesia 11/19/2010   Dermatitis, eczematoid 08/20/2010   Difficulty hearing 08/20/2010   Adaptive colitis 08/20/2010   Cannot sleep 08/20/2010   Affective disorder (HCC) 08/20/2010   PAST MEDICAL HISTORY:  Active Ambulatory Problems    Diagnosis Date Noted   SPL (spondylolisthesis) 07/28/2014   Dementia (HCC) 01/25/2013   Dermatitis, eczematoid 08/20/2010   Blood pressure elevated 12/28/2012   Difficulty hearing 08/20/2010   Adaptive colitis 08/20/2010   Cannot sleep 08/20/2010   Hypertensive left ventricular hypertrophy 12/21/2013   Amnesia 11/19/2010   Affective disorder (HCC) 08/20/2010   Osteopenia 07/28/2014   Poor balance 07/28/2014   Disorder of rotator cuff 07/09/2013   Regurgitation, of nonorganic origin, of food with reswallowing 01/25/2013   Adynamia 07/28/2014   Hypokalemia 02/05/2015   Malnutrition of moderate degree 02/07/2015   Acute pulmonary embolism (HCC) 02/09/2015   Closed right hip fracture (HCC) 07/22/2015  Hip fracture (HCC) 07/22/2015   Pressure ulcer 07/28/2015   Hyperkalemia 04/04/2016   Resolved Ambulatory Problems    Diagnosis Date Noted   No Resolved Ambulatory Problems   Past Medical History:  Diagnosis Date   Anterolisthesis    Blood in stool    Eczema    Hematuria    Hypertension    IBS (irritable bowel syndrome)    Insomnia    LVH (left ventricular hypertrophy) due to hypertensive disease    Urethra, polyp    Vaginal bleeding    SOCIAL HX:  Social History   Tobacco Use   Smoking status: Never    Smokeless tobacco: Never  Substance Use Topics   Alcohol use: No   FAMILY HX: No family history on file.    ALLERGIES:  Allergies  Allergen Reactions   Azithromycin Itching   Bactrim [Sulfamethoxazole-Trimethoprim]    Other Nausea And Vomiting   Penicillins Swelling    Has patient had a PCN reaction causing immediate rash, facial/tongue/throat swelling, SOB or lightheadedness with hypotension: Yes Has patient had a PCN reaction causing severe rash involving mucus membranes or skin necrosis: No Has patient had a PCN reaction that required hospitalization No Has patient had a PCN reaction occurring within the last 10 years: Yes If all of the above answers are NO, then may proceed with Cephalosporin use.   Sulfa Antibiotics    Vibramycin [Doxycycline Calcium ] Other (See Comments)    Reaction : Unknown     PERTINENT MEDICATIONS:  Outpatient Encounter Medications as of 04/24/2021  Medication Sig   acetaminophen  (TYLENOL ) 650 MG CR tablet Take 1 tablet by mouth in the morning and at bedtime.   dicyclomine  (BENTYL ) 20 MG tablet Take 10 mg by mouth in the morning, at noon, and at bedtime.   folic acid  (FOLVITE ) 1 MG tablet Take 1 mg by mouth daily.   Lifitegrast  (XIIDRA ) 5 % SOLN Place 1 drop into both eyes in the morning and at bedtime.   nystatin (MYCOSTATIN/NYSTOP) powder Apply 1 application. topically 2 (two) times daily.   PARoxetine  (PAXIL ) 10 MG tablet Take 1 tablet by mouth daily.   QUEtiapine  (SEROQUEL ) 50 MG tablet Take 1 tablet by mouth 2 (two) times daily.   [DISCONTINUED] folic acid  (FOLVITE ) 1 MG tablet Take 1 mg by mouth daily.    [DISCONTINUED] Lifitegrast  5 % SOLN Place 1 drop into both eyes 2 (two) times daily.   [DISCONTINUED] PARoxetine  (PAXIL ) 10 MG tablet Take 10 mg by mouth daily.    acetaminophen  (TYLENOL ) 325 MG tablet Take 2 tablets (650 mg total) by mouth every 6 (six) hours as needed for mild pain (or Fever >/= 101). (Patient not taking: Reported on  04/24/2021)   ketoconazole  (NIZORAL ) 2 % cream Apply 1 application topically daily.    Magnesium  500 MG TABS Take 1 tablet by mouth daily.   potassium chloride  (K-DUR,KLOR-CON ) 10 MEQ tablet Take 1 tablet (10 mEq total) by mouth daily. For five days starting sunday (Patient taking differently: Take 20 mEq by mouth daily. For five days starting sunday)   QUEtiapine  (SEROQUEL ) 25 MG tablet Take 37.5 mg by mouth at bedtime.    senna (SENOKOT) 8.6 MG TABS tablet Take 1 tablet (8.6 mg total) by mouth 2 (two) times daily. (Patient not taking: Reported on 04/24/2021)   [DISCONTINUED] cyanocobalamin  (,VITAMIN B-12,) 1000 MCG/ML injection Inject 1,000 mcg into the muscle every 30 (thirty) days.   [DISCONTINUED] Iron-Vitamins (GERITOL COMPLETE) TABS Take 1 tablet by mouth daily.    [  DISCONTINUED] levofloxacin  (LEVAQUIN ) 750 MG tablet Take 1 tablet (750 mg total) by mouth every other day.   [DISCONTINUED] memantine  (NAMENDA ) 10 MG tablet Take 5 mg by mouth 2 (two) times daily.    [DISCONTINUED] mometasone -formoterol  (DULERA ) 100-5 MCG/ACT AERO Inhale 2 puffs into the lungs 2 (two) times daily. (Patient not taking: Reported on 07/22/2015)   [DISCONTINUED] nitrofurantoin , macrocrystal-monohydrate, (MACROBID ) 100 MG capsule Take 1 capsule (100 mg total) 2 (two) times daily by mouth.   [DISCONTINUED] QUEtiapine  (SEROQUEL ) 25 MG tablet Take 12.5 mg by mouth daily. At 1600   No facility-administered encounter medications on file as of 04/24/2021.   Thank you for the opportunity to participate in the care of Ms. Bacci.  The palliative care team will continue to follow. Please call our office at [phone] if we can be of additional assistance.   Lamarr Welby Sharps, NP , DNP, AGPCNP-BC  COVID-19 PATIENT SCREENING TOOL Asked and negative response unless otherwise noted:  Have you had symptoms of covid, tested positive or been in contact with someone with symptoms/positive test in the past 5-10 days?

## 2021-05-10 ENCOUNTER — Other Ambulatory Visit: Payer: Self-pay

## 2021-05-10 ENCOUNTER — Emergency Department
Admission: EM | Admit: 2021-05-10 | Discharge: 2021-05-10 | Disposition: A | Discharge status: Home / Self Care | Payer: Medicare Other | Attending: Emergency Medicine | Admitting: Emergency Medicine

## 2021-05-10 ENCOUNTER — Emergency Department: Payer: Medicare Other

## 2021-05-10 ENCOUNTER — Telehealth: Payer: Self-pay | Admitting: Primary Care

## 2021-05-10 DIAGNOSIS — R531 Weakness: Secondary | ICD-10-CM | POA: Insufficient documentation

## 2021-05-10 LAB — BASIC METABOLIC PANEL
Anion gap: 12 (ref 5–15)
BUN: 20 mg/dL (ref 8–23)
CO2: 28 mmol/L (ref 22–32)
Calcium: 9.8 mg/dL (ref 8.9–10.3)
Chloride: 99 mmol/L (ref 98–111)
Creatinine, Ser: 0.96 mg/dL (ref 0.44–1.00)
GFR, Estimated: 54 mL/min — ABNORMAL LOW (ref >60)
Glucose, Bld: 104 mg/dL — ABNORMAL HIGH (ref 70–99)
Potassium: 3.9 mmol/L (ref 3.5–5.1)
Sodium: 139 mmol/L (ref 135–145)

## 2021-05-10 LAB — CBC
HCT: 43.2 % (ref 36.0–46.0)
Hemoglobin: 13.9 g/dL (ref 12.0–15.0)
MCH: 27.7 pg (ref 26.0–34.0)
MCHC: 32.2 g/dL (ref 30.0–36.0)
MCV: 86.1 fL (ref 80.0–100.0)
Platelets: 289 10*3/uL (ref 150–400)
RBC: 5.02 MIL/uL (ref 3.87–5.11)
RDW: 13.6 % (ref 11.5–15.5)
WBC: 8.1 10*3/uL (ref 4.0–10.5)
nRBC: 0 % (ref 0.0–0.2)

## 2021-05-10 NOTE — ED Triage Notes (Addendum)
Patient to ER via [facility] from home. Patient's family noticed that patient was leaning more to the right side than usual. Symptoms started approx 1hr ago. Patient alert and at baseline. Oriented to self only. Dementia. ? ?Patient with no other complaints. Denies pain when asked.  ? ?Hard of hearing. ? ?EMS VS- 119/91 BP, HR 90, cbg 117.  ?

## 2021-05-10 NOTE — ED Notes (Signed)
Spoke with pt's family about in&out cath for urine. Family has refused because they think it will do more harm than good. Pt's family is very involved, and states that they have a keen awareness of when she has a UTI. They state that if there are no acute changes on the CT scan of her head, they would like to take her home.  ?

## 2021-05-10 NOTE — Telephone Encounter (Signed)
T/c from daughter. Returned call, pt on way to ED. Will f/u with liaisons as needed. ?

## 2021-05-10 NOTE — Discharge Instructions (Signed)
Please seek medical attention for any high fevers, chest pain, shortness of breath, change in behavior, persistent vomiting, bloody stool or any other new or concerning symptoms.  

## 2021-05-10 NOTE — Progress Notes (Signed)
[facility] ED12 [facility] Hospital Liaison note:  This patient is currently enrolled in [facility] outpatient-based Palliative Care. Will continue to follow for disposition.  Please call with any outpatient palliative questions or concerns.  Thank you, Dee Curry, LPN [facility] Hospital Liaison [phone] 

## 2021-05-10 NOTE — ED Provider Notes (Signed)
? ?[facility] ?Provider Note ? ? ? Event Date/Time  ? First MD Initiated Contact with Patient 05/10/21 1507   ?  (approximate) ? ? ?History  ? ?Weakness ? ? ?HPI ? ?[name] is a 88 y.o. female  who, per palliative care note dated a little over two weeks ago under medical interventions states "Comfort Measures: Keep clean, warm, and dry. Use medication by any route, positioning, wound care, and other measures to relieve pain and suffering. Use oxygen, suction and manual treatment of airway obstruction as needed for comfort. Do not transfer to the hospital unless comfort needs cannot be met in current location," who was brought to the ED today by EMS because of apparent concern for leaning more to the right today. Patient has history of dementia so cannot give any significant history.  ? ?Physical Exam  ? ?Triage Vital Signs: ?ED Triage Vitals [05/10/21 1508]  ?Enc Vitals Group  ?   BP 137/94  ?   Pulse 83  ?   Resp 17  ?   Temp 98  ?   Temp src   ?   SpO2 95  ?   Weight   ?   Height '5\' 4"'  (1.626 m)  ?   Head Circumference   ? ?General: Awake, no distress. Dementia. ?CV:  Good peripheral perfusion. Regular rate and rhythm. ?Resp:  Normal effort. Lungs clear to auscultation. ?Abd:  No distention.  ? ?ED Results / Procedures / Treatments  ? ?Labs ?(all labs ordered are listed, but only abnormal results are displayed) ?Labs Reviewed  ?BASIC METABOLIC PANEL - Abnormal; Notable for the following components:  ?    Result Value  ? Glucose, Bld 104 (*)   ? GFR, Estimated 54 (*)   ? All other components within normal limits  ?CBC  ?URINALYSIS, COMPLETE (UACMP) WITH MICROSCOPIC  ?CBG MONITORING, ED  ? ? ? ?EKG ? ?INance Pear, attending physician, personally viewed and interpreted this EKG ? ?EKG Time: 1509 ?Rate: 85 ?Rhythm: sinus rhythm ?Axis: left axis deviation ?Intervals: qtc 482 ?QRS: narrow, q waves v1, v2, v3 ?ST changes: no st elevation ?Impression: abnormal ekg ? ? ? ?RADIOLOGY ?I  independently interpreted and visualized the CT head. My interpretation: No acute bleed. ?Radiology interpretation: IMPRESSION: ?No acute intracranial hemorrhage or evidence of acute infarction. ? ?Chronic infarcts and chronic microvascular ischemic changes.  ? ? ?PROCEDURES: ? ?Critical Care performed: No ? ?Procedures ? ? ?MEDICATIONS ORDERED IN ED: ?Medications - No data to display ? ? ?IMPRESSION / MDM / ASSESSMENT AND PLAN / ED COURSE  ?I reviewed the triage vital signs and the nursing notes. ?             ?               ? ?Differential diagnosis includes, but is not limited to, anemia, electrolyte abnormality, stroke, infection. ? ?Patient presents to the emergency department because of concern for leaning to the right. Head ct without any acute bleed. Blood work without anemia or concerning electrolyte abnormality. Did have a discusion with the family. We did discuss possibility of CVA. Discussed that MRI would be the best test to evaluate. At this time however family felt comfortable deferring. Discussed what the plan would be if patient did have a stroke and family would not be interested in further stroke work up or anticoagulation. They feel patient would be more comfortable at home. Additionally they do not feel patient has  UTI at this time, given they normally are able to tell with odor.  ? ?FINAL CLINICAL IMPRESSION(S) / ED DIAGNOSES  ? ?Final diagnoses:  ?Weakness  ? ? ? ? ?Note:  This document was prepared using Dragon voice recognition software and may include unintentional dictation errors. ? ?  ?Nance Pear, MD ?05/10/21 2029 ? ?

## 2021-06-05 ENCOUNTER — Other Ambulatory Visit: Payer: Medicare Other | Admitting: Primary Care

## 2021-06-05 DIAGNOSIS — R2689 Other abnormalities of gait and mobility: Secondary | ICD-10-CM

## 2021-06-05 DIAGNOSIS — F02818 Dementia in other diseases classified elsewhere, unspecified severity, with other behavioral disturbance: Secondary | ICD-10-CM

## 2021-06-05 DIAGNOSIS — E440 Moderate protein-calorie malnutrition: Secondary | ICD-10-CM

## 2021-06-05 DIAGNOSIS — Z515 Encounter for palliative care: Secondary | ICD-10-CM

## 2021-06-05 DIAGNOSIS — G309 Alzheimer's disease, unspecified: Secondary | ICD-10-CM

## 2021-06-05 NOTE — Progress Notes (Signed)
? ? ?[facility] ?Community Palliative Care Consult Note ?Telephone: [phone]  ?Fax: [phone]  ? ? ?Date of encounter: 06/05/21 ?10:35 AM ?PATIENT NAME: [name] ?[place] [address]   ?[phone] (home)  ?DOB: 1925-01-22 ?MRN: [id number] ?PRIMARY CARE PROVIDER:    ?Hortencia Pilar, MD,  ?[address], [address] ?[place] [address] ?[phone] ? ?REFERRING PROVIDER:   ?Hortencia Pilar, MD ?[address], [address] ?[place],  [place] [address] ?[phone] ? ?RESPONSIBLE PARTY:    ?Contact Information   ? ? Name Relation Home Work Mobile  ? Lars Mage Daughter [phone]  [phone]  ? Loy,Pamela Daughter [phone]  [phone]  ? Octaviano Batty Daughter [phone]  [phone]  ? ?  ? ? ? ?I met face to face with patient and family in the  home. Palliative Care was asked to follow this patient by consultation request of  Hortencia Pilar, MD to address advance care planning and complex medical decision making. This is a follow up visit. ? ?                                 ASSESSMENT AND PLAN / RECOMMENDATIONS:  ? ?Advance Care Planning/Goals of Care: Goals include to maximize quality of life and symptom management. Patient/health care surrogate gave his/her permission to discuss.Our advance care planning conversation included a discussion about:    ?The value and importance of advance care planning  ?Experiences with loved ones who have been seriously ill or have died  ?Exploration of personal, cultural or spiritual beliefs that might influence medical decisions  ?Exploration of goals of care in the event of a sudden injury or illness  ?Identification of a healthcare agent - Daughter ?Review  of an  advance directive document . ?CODE STATUS: DNR ? ?Symptom Management/Plan: ? ?Patient has begun to decline since my last visit. She has become more combative at times, and went to ED a month ago for TIA sx. They discussed goals of care there and reiterated  comfort care. She had no clinical signs of stroke in ED, and nothing on imaging. She returned to her baseline while at ED. Today we continue to discuss outcomes should this happen again. ? ? Family reports she is no longer able to transfer (I)  and needs heavy assist to transfer to chair/ bed. No falls reported. Eats 50% of diet, most days. Weights not available but a year ago she was in a size 14 and now she is size 6-8, likely a 30 lb loss over 1 year. FAST score 7D. 111/60 HR 78 RR 18. We also discussed getting a hospital bed in as she is no longer able to assist with transfers or self positioning. She spends the day on the sofa without ability to change positions alone.  She is becoming more combative with personal care, and often spits out medications. We discussed behaviors and inability to medicate. My recommendations for medication adjustments were not seen as workable by family due to this behavior. ? ? It is not clear if she meets hospice criteria again but she does appear to be declining. I will monitor closely and provide  family support. ? ? ?Follow up Palliative Care Visit: Palliative care will continue to follow for complex medical decision making, advance care planning, and clarification of goals. Return 4 weeks or prn. ? ?I spent 60 minutes providing this consultation. More than 50% of the time  in this consultation was spent in counseling and care coordination. ?PPS: 30% ? ?HOSPICE ELIGIBILITY/DIAGNOSIS: TBD ? ?Chief Complaint: debility, immobility, dementia ? ?HISTORY OF PRESENT ILLNESS:  [name] is a 88 y.o. year old female  with Advanced dementia, debility, immobility, wt loss . Patient seen today to review palliative care needs to include medical decision making and advance care planning as appropriate.  ? ?History obtained from review of EMR, discussion with primary team, and interview with family, facility staff/caregiver and/or Ms. Mundie.  ?I reviewed available labs, medications,  imaging, studies and related documents from the EMR.  Records reviewed and summarized above.  ? ?ROS ? ?General: NAD ?ENMT: denies dysphagia ?Cardiovascular: denies DOE ?Pulmonary: denies cough, denies increased SOB ?Abdomen: endorses fair appetite, denies constipation, endorses incontinence of bowel ?GU: denies dysuria, endorses incontinence of urine ?MSK:  denies  increased weakness,  no falls reported ?Skin: denies rashes or wounds ?Neurological: denies pain, denies insomnia ?Psych: Endorses agitated mood, baseline ? ?Physical Exam: ?Current and past weights: unavailable, likely continual loss ?Constitutional:111/60 HR 78 RR 18 Temp 98.0 ?General: frail appearing, thin ?EYES: anicteric sclera, lids intact, no discharge  ?ENMT: intact hearing, oral mucous membranes moist, dentition intact ?CV: S1S2, RRR, no LE edema ?Pulmonary: LCTA, no increased work of breathing, no cough, room air ?Abdomen: intake 50%, soft and non tender, no ascites ?MSK: + sarcopenia, moves all extremities, non ambulatory, non wt bearing ?Skin: warm and dry, no rashes or wounds on visible skin ?Neuro:  + generalized weakness,  severe cognitive impairment, anxious affect ? ?Thank you for the opportunity to participate in the care of Ms. Reily.  The palliative care team will continue to follow. Please call our office at [phone] if we can be of additional assistance.  ? ?Jason Coop, NP DNP, AGPCNP-BC ? ?COVID-19 PATIENT SCREENING TOOL ?Asked and negative response unless otherwise noted:  ? ?Have you had symptoms of covid, tested positive or been in contact with someone with symptoms/positive test in the past 5-10 days?  ? ?

## 2021-06-18 ENCOUNTER — Telehealth: Payer: Self-pay | Admitting: Primary Care

## 2021-06-18 NOTE — Telephone Encounter (Signed)
T/c from daughter, requesting hospice services again for her mother due to decline. I concur and put in call to PCP to generate referral.  ?

## 2021-06-19 ENCOUNTER — Telehealth: Payer: Self-pay

## 2021-06-19 NOTE — Telephone Encounter (Signed)
1245 pm.  Follow up call made to PCP office to check on status of hospice referral.  Spoke with Archie Patten, CMA who will submit another request.  Also provided fax number for referral to be sent.  ? ?Clearnce Sorrel, NP updated.  ?

## 2021-07-05 ENCOUNTER — Other Ambulatory Visit: Payer: Medicare Other | Admitting: Primary Care

## 2021-11-04 DEATH — deceased
